# Patient Record
Sex: Female | Born: 1945 | Race: White | Hispanic: No | Marital: Single | Smoking: Never smoker
Health system: Southern US, Community
[De-identification: ages and names within clinical notes are randomized; demographics above are authoritative.]

## PROBLEM LIST (undated history)

## (undated) DIAGNOSIS — Z961 Presence of intraocular lens: Secondary | ICD-10-CM

## (undated) DIAGNOSIS — F419 Anxiety disorder, unspecified: Secondary | ICD-10-CM

## (undated) DIAGNOSIS — E669 Obesity, unspecified: Secondary | ICD-10-CM

## (undated) DIAGNOSIS — H33199 Other retinoschisis and retinal cysts, unspecified eye: Secondary | ICD-10-CM

## (undated) DIAGNOSIS — I219 Acute myocardial infarction, unspecified: Secondary | ICD-10-CM

## (undated) DIAGNOSIS — M109 Gout, unspecified: Secondary | ICD-10-CM

## (undated) DIAGNOSIS — K219 Gastro-esophageal reflux disease without esophagitis: Secondary | ICD-10-CM

## (undated) DIAGNOSIS — M48061 Spinal stenosis, lumbar region without neurogenic claudication: Secondary | ICD-10-CM

## (undated) DIAGNOSIS — R7303 Prediabetes: Secondary | ICD-10-CM

## (undated) DIAGNOSIS — Z87442 Personal history of urinary calculi: Secondary | ICD-10-CM

## (undated) DIAGNOSIS — I5189 Other ill-defined heart diseases: Secondary | ICD-10-CM

## (undated) DIAGNOSIS — M1990 Unspecified osteoarthritis, unspecified site: Secondary | ICD-10-CM

## (undated) DIAGNOSIS — I2510 Atherosclerotic heart disease of native coronary artery without angina pectoris: Secondary | ICD-10-CM

## (undated) DIAGNOSIS — I10 Essential (primary) hypertension: Secondary | ICD-10-CM

## (undated) DIAGNOSIS — E785 Hyperlipidemia, unspecified: Secondary | ICD-10-CM

## (undated) DIAGNOSIS — C801 Malignant (primary) neoplasm, unspecified: Secondary | ICD-10-CM

## (undated) DIAGNOSIS — B019 Varicella without complication: Secondary | ICD-10-CM

## (undated) DIAGNOSIS — C189 Malignant neoplasm of colon, unspecified: Secondary | ICD-10-CM

## (undated) DIAGNOSIS — K635 Polyp of colon: Secondary | ICD-10-CM

## (undated) HISTORY — DX: Malignant (primary) neoplasm, unspecified: C80.1

## (undated) HISTORY — DX: Malignant neoplasm of colon, unspecified: C18.9

## (undated) HISTORY — PX: ABDOMINAL HYSTERECTOMY: SHX81

## (undated) HISTORY — PX: TONSILLECTOMY: SUR1361

## (undated) HISTORY — DX: Gastro-esophageal reflux disease without esophagitis: K21.9

## (undated) HISTORY — PX: EYE SURGERY: SHX253

## (undated) HISTORY — PX: COLON SURGERY: SHX602

## (undated) HISTORY — DX: Hyperlipidemia, unspecified: E78.5

## (undated) HISTORY — DX: Essential (primary) hypertension: I10

## (undated) HISTORY — PX: CARDIAC CATHETERIZATION: SHX172

## (undated) HISTORY — DX: Atherosclerotic heart disease of native coronary artery without angina pectoris: I25.10

## (undated) HISTORY — PX: APPENDECTOMY: SHX54

## (undated) HISTORY — DX: Other ill-defined heart diseases: I51.89

## (undated) HISTORY — DX: Gout, unspecified: M10.9

## (undated) HISTORY — PX: RIB RESECTION: SHX5077

## (undated) HISTORY — DX: Polyp of colon: K63.5

---

## 1990-11-26 DIAGNOSIS — C189 Malignant neoplasm of colon, unspecified: Secondary | ICD-10-CM

## 1990-11-26 HISTORY — PX: NEPHRECTOMY: SHX65

## 1990-11-26 HISTORY — DX: Malignant neoplasm of colon, unspecified: C18.9

## 1991-03-04 HISTORY — PX: COLON RESECTION: SHX5231

## 1991-11-27 HISTORY — PX: HERNIA REPAIR: SHX51

## 2006-09-26 ENCOUNTER — Ambulatory Visit: Payer: Self-pay | Admitting: Urology

## 2006-11-11 ENCOUNTER — Emergency Department: Payer: Self-pay | Admitting: Emergency Medicine

## 2006-12-21 ENCOUNTER — Inpatient Hospital Stay: Payer: Self-pay | Admitting: Internal Medicine

## 2006-12-21 ENCOUNTER — Other Ambulatory Visit: Payer: Self-pay

## 2006-12-26 DIAGNOSIS — K219 Gastro-esophageal reflux disease without esophagitis: Secondary | ICD-10-CM | POA: Insufficient documentation

## 2007-05-06 ENCOUNTER — Ambulatory Visit: Payer: Self-pay

## 2008-01-18 ENCOUNTER — Other Ambulatory Visit: Payer: Self-pay

## 2008-01-20 ENCOUNTER — Inpatient Hospital Stay: Payer: Self-pay | Admitting: Internal Medicine

## 2008-02-09 ENCOUNTER — Ambulatory Visit: Payer: Self-pay | Admitting: Urology

## 2008-07-02 ENCOUNTER — Other Ambulatory Visit: Payer: Self-pay

## 2008-07-02 ENCOUNTER — Emergency Department: Payer: Self-pay | Admitting: Emergency Medicine

## 2008-07-03 ENCOUNTER — Emergency Department: Payer: Self-pay | Admitting: Emergency Medicine

## 2008-10-14 ENCOUNTER — Emergency Department: Payer: Self-pay | Admitting: Emergency Medicine

## 2008-10-15 ENCOUNTER — Ambulatory Visit: Payer: Self-pay | Admitting: Emergency Medicine

## 2008-10-24 ENCOUNTER — Observation Stay: Payer: Self-pay | Admitting: Internal Medicine

## 2009-10-07 ENCOUNTER — Ambulatory Visit: Payer: Self-pay | Admitting: General Surgery

## 2009-11-26 DIAGNOSIS — K635 Polyp of colon: Secondary | ICD-10-CM

## 2009-11-26 HISTORY — DX: Polyp of colon: K63.5

## 2010-01-18 ENCOUNTER — Ambulatory Visit: Payer: Self-pay | Admitting: General Surgery

## 2010-03-14 ENCOUNTER — Ambulatory Visit: Payer: Self-pay | Admitting: General Surgery

## 2010-03-14 HISTORY — PX: COLONOSCOPY: SHX174

## 2011-03-03 ENCOUNTER — Emergency Department: Payer: Self-pay | Admitting: Emergency Medicine

## 2011-03-03 ENCOUNTER — Encounter: Payer: Self-pay | Admitting: Cardiovascular Disease

## 2011-03-08 ENCOUNTER — Encounter: Payer: Self-pay | Admitting: Cardiovascular Disease

## 2011-03-08 ENCOUNTER — Ambulatory Visit (INDEPENDENT_AMBULATORY_CARE_PROVIDER_SITE_OTHER): Payer: Medicare Other | Admitting: Cardiovascular Disease

## 2011-03-08 DIAGNOSIS — I10 Essential (primary) hypertension: Secondary | ICD-10-CM

## 2011-03-08 DIAGNOSIS — E785 Hyperlipidemia, unspecified: Secondary | ICD-10-CM

## 2011-03-08 DIAGNOSIS — R079 Chest pain, unspecified: Secondary | ICD-10-CM

## 2011-03-08 NOTE — Progress Notes (Signed)
   Patient ID: Carrie Larsen, female    DOB: [birth year], 65 y.o.   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  She reports that she had the acute onset of left breast pain. She described it as a squeezing sensation that was quite uncomfortable. It waxed and waned, coming on for several seconds at a time before easing off and then coming on again. This seemed to resolve in the emergency room. EKG and troponin as well as other labs were normal. She's not had any similar symptoms since then though she does have significant amount of burping which is unusual for her. She is also noticed some sharp stabbing pain that comes on on the left side that is quick, like a "lightening bolt ".  She does have a breast exam scheduled in the very near future. She has been started on a low dose simvastatin 10 mg daily for her hyperlipidemia.  At baseline, she is very active, works as a Child psychotherapist and can ambulate and move her usual meal tray without reproducing any chest pain  EKG today shows normal sinus rhythm with rate 62 beats per minute with no significant ST or T wave changes     Review of Systems  Constitutional: Negative.   HENT: Negative.   Eyes: Negative.   Respiratory: Negative.   Cardiovascular: Positive for chest pain.  Gastrointestinal: Negative.   Musculoskeletal: Negative.   Skin: Negative.   Neurological: Negative.   Hematological: Negative.   Psychiatric/Behavioral: Negative.   All other systems reviewed and are negative.   BP 142/78  Pulse 62  Ht 5\' 10"  (1.778 m)  Wt 234 lb 12.8 oz (106.505 kg)  BMI 33.69 kg/m2   Physical Exam  Nursing note and vitals reviewed. Constitutional: She is oriented to person, place, and time. She appears well-developed and  well-nourished.  HENT:  Head: Normocephalic.  Nose: Nose normal.  Mouth/Throat: Oropharynx is clear and moist.  Eyes: Conjunctivae are normal. Pupils are equal, round, and reactive to light.  Neck: Normal range of motion. Neck supple. No JVD present.  Cardiovascular: Normal rate, regular rhythm, normal heart sounds and intact distal pulses.  Exam reveals no gallop and no friction rub.   No murmur heard. Pulmonary/Chest: Effort normal and breath sounds normal. No respiratory distress. She has no wheezes. She has no rales. She exhibits no tenderness.  Abdominal: Soft. Bowel sounds are normal. She exhibits no distension. There is no tenderness.  Musculoskeletal: Normal range of motion. She exhibits no edema and no tenderness.  Lymphadenopathy:    She has no cervical adenopathy.  Neurological: She is alert and oriented to person, place, and time. Coordination normal.  Skin: Skin is warm and dry. No rash noted. No erythema.  Psychiatric: She has a normal mood and affect. Her behavior is normal. Judgment and thought content normal.         Assessment and Plan

## 2011-03-08 NOTE — Assessment & Plan Note (Signed)
Blood pressure is well controlled on today's visit. No changes made to the medications. 

## 2011-03-08 NOTE — Assessment & Plan Note (Signed)
Chest pain symptoms are somewhat atypical in nature. She does have some risk factors including hyperlipidemia, remote smoking. We will set her up for a routine treadmill study next week.

## 2011-03-08 NOTE — Patient Instructions (Addendum)
  You are scheduled for a Treadmill Stress Test for Thursday 03/15/11 @ 9:30. Please HOLD your Metoprolol the evening prior to test AND the MORNING of test. Please wear comfortable clothing and walking shoes. No other preparation is needed for test.  You will receive the results of your test the same day. Otherwise, you can follow up as needed.    Exercise Test, Stress Test  An exercise stress test is a heart test (EKG) which is done while you are moving. You will walk on a treadmill. This test will tell your doctor how your heart does when it is forced to work harder and how much activity you can safely handle. A trained technician, a doctor, or physician assistant (PA) who specializes in heart disease will perform the test. WHAT SHOULD I WEAR?  Shorts or athletic pants.   Comfortable tennis shoes.   Women need to wear a bra that allows patches to be put on under it.  WHAT WILL HAPPEN DURING THE TEST?  An EKG cable will be attached to your waist. This cable is hooked up to patches, which look like round stickers stuck to your chest.   You will be asked to walk on the treadmill.   You will walk until you are too tired or until you are told to stop.  HOW LONG WILL THE TEST LAST?  It may last 30 minutes to 1 hour. The timing depends on your physical condition and the condition of your heart.   Tell the doctor or PA right away if you have:   Chest pain.  Leg cramps.   Shortness of breath.  Dizziness.   WHAT HAPPENS AFTER THE TEST?  You will rest for about 6 minutes. During this time, your technician will check your heart rhythm and blood pressure.   The testing equipment will be removed from your body and you can get dressed.   You may go home or back to your hospital room. You may keep doing all your usual activities as directed by your doctor.  FINDING OUT THE RESULTS OF YOUR TEST Ask your doctor when your test results will be ready. Make sure you follow up and get your  test results. Document Released: 04/30/2008 Document Re-Released: 02/06/2010

## 2011-03-08 NOTE — Assessment & Plan Note (Signed)
I would agree with cholesterol medication. She has had problems with myalgia in the past.

## 2011-03-15 ENCOUNTER — Ambulatory Visit (INDEPENDENT_AMBULATORY_CARE_PROVIDER_SITE_OTHER): Payer: Medicare Other | Admitting: Cardiovascular Disease

## 2011-03-15 ENCOUNTER — Ambulatory Visit: Payer: Self-pay | Admitting: Family Medicine

## 2011-03-15 DIAGNOSIS — R079 Chest pain, unspecified: Secondary | ICD-10-CM

## 2011-03-15 NOTE — Progress Notes (Deleted)
Adult Stress Test Report  03/15/2011   Requesting Physician: Provider Not In System  Study: {noninvasive testing:14697}  Pre-test ECG: {normal/abnormal:14647}  Level of Stress:  ***% age-predicted max HR  *** METS achieved  Functional Capacity: {funct capacity:14698}  Abnormal Symptoms: {symptoms:14699}  Heart Rate Response: {hr response:14700}  BP Response:  {bp response:14701}  Baseline LVEF: Echo *** %,  Nuclear *** %  Stress ECG: {findings; ecg:14702}  Stress Imaging Report:  {findings; stress imaging:14703}   Impression:   {findings; stress test:14704}  Interpreted by:  Bishop Dublin 03/15/2011

## 2011-03-17 NOTE — Progress Notes (Signed)
Treadmill ordered for recent epsiodes of chest pain.  Resting EKG shows NSR with rate of 102 bpm, no significant ST or T wave changes Resting blood pressure of 130/86. Manual protocal was used.  Patient exercised for 10 min 5 sec,  Peak heart rate of 155 bpm.  This was 107% of the maximum predicted heart rate (target heart rate 131). Achieved 7.3 METS No symptoms of chest pain or lightheadedness were reported at peak stress or in recovery.  Peak Blood pressure recorded was 172/68. Heart rate at 3 minutes in recovery was 112 bpm.  FINAL IMPRESSION: Normal exercise stress test. No significant EKG changes concerning for ischemia. Baseline sinus tachycardia off B-blockers. Excellent exercise tolerance.

## 2011-04-03 ENCOUNTER — Encounter: Payer: Self-pay | Admitting: Cardiovascular Disease

## 2011-04-30 ENCOUNTER — Other Ambulatory Visit: Payer: Self-pay | Admitting: Cardiovascular Disease

## 2011-08-29 ENCOUNTER — Emergency Department: Payer: Self-pay | Admitting: Emergency Medicine

## 2011-12-28 ENCOUNTER — Ambulatory Visit: Payer: Self-pay | Admitting: Urology

## 2012-01-09 ENCOUNTER — Ambulatory Visit: Payer: Self-pay | Admitting: Urology

## 2012-06-12 ENCOUNTER — Ambulatory Visit: Payer: Medicare Other | Admitting: Cardiovascular Disease

## 2012-06-17 ENCOUNTER — Ambulatory Visit: Payer: Self-pay | Admitting: Internal Medicine

## 2012-08-23 ENCOUNTER — Ambulatory Visit: Payer: Self-pay | Admitting: Medical

## 2012-08-23 LAB — URINALYSIS, COMPLETE
Bilirubin,UR: NEGATIVE
Blood: NEGATIVE
Glucose,UR: NEGATIVE mg/dL (ref 0–75)
Ketone: NEGATIVE
Nitrite: NEGATIVE
Ph: 6.5 (ref 4.5–8.0)
Protein: NEGATIVE
RBC,UR: NONE SEEN /HPF (ref 0–5)
Specific Gravity: 1.015 (ref 1.003–1.030)

## 2012-08-24 ENCOUNTER — Emergency Department: Payer: Self-pay | Admitting: Emergency Medicine

## 2012-08-24 LAB — CBC WITH DIFFERENTIAL/PLATELET
Basophil #: 0 10*3/uL (ref 0.0–0.1)
Basophil %: 0.8 %
Eosinophil #: 0.2 10*3/uL (ref 0.0–0.7)
Eosinophil %: 2.7 %
HCT: 42 % (ref 35.0–47.0)
HGB: 14.4 g/dL (ref 12.0–16.0)
Lymphocyte #: 2.9 10*3/uL (ref 1.0–3.6)
Lymphocyte %: 43.8 %
MCH: 30.2 pg (ref 26.0–34.0)
MCHC: 34.2 g/dL (ref 32.0–36.0)
MCV: 88 fL (ref 80–100)
Monocyte #: 0.5 x10 3/mm (ref 0.2–0.9)
Monocyte %: 7.3 %
Neutrophil #: 3 10*3/uL (ref 1.4–6.5)
Neutrophil %: 45.4 %
Platelet: 296 10*3/uL (ref 150–440)
RBC: 4.75 10*6/uL (ref 3.80–5.20)
RDW: 13.4 % (ref 11.5–14.5)
WBC: 6.6 10*3/uL (ref 3.6–11.0)

## 2012-08-24 LAB — URINALYSIS, COMPLETE
Bilirubin,UR: NEGATIVE
Blood: NEGATIVE
Glucose,UR: NEGATIVE mg/dL (ref 0–75)
Ketone: NEGATIVE
Leukocyte Esterase: NEGATIVE
Nitrite: POSITIVE
Ph: 5 (ref 4.5–8.0)
Protein: NEGATIVE
RBC,UR: 4 /HPF (ref 0–5)
Specific Gravity: 1.013 (ref 1.003–1.030)
Squamous Epithelial: 2
WBC UR: 13 /HPF (ref 0–5)

## 2012-08-24 LAB — COMPREHENSIVE METABOLIC PANEL
Albumin: 3.8 g/dL (ref 3.4–5.0)
Alkaline Phosphatase: 130 U/L (ref 50–136)
Anion Gap: 9 (ref 7–16)
BUN: 19 mg/dL — ABNORMAL HIGH (ref 7–18)
Bilirubin,Total: 0.4 mg/dL (ref 0.2–1.0)
Calcium, Total: 9.4 mg/dL (ref 8.5–10.1)
Chloride: 108 mmol/L — ABNORMAL HIGH (ref 98–107)
Co2: 24 mmol/L (ref 21–32)
Creatinine: 0.85 mg/dL (ref 0.60–1.30)
EGFR (African American): >60
EGFR (Non-African Amer.): >60
Glucose: 108 mg/dL — ABNORMAL HIGH (ref 65–99)
Osmolality: 284 (ref 275–301)
Potassium: 4.4 mmol/L (ref 3.5–5.1)
SGOT(AST): 32 U/L (ref 15–37)
SGPT (ALT): 64 U/L (ref 12–78)
Sodium: 141 mmol/L (ref 136–145)
Total Protein: 7.3 g/dL (ref 6.4–8.2)

## 2012-08-25 LAB — URINE CULTURE

## 2013-01-26 ENCOUNTER — Emergency Department: Payer: Self-pay | Admitting: Emergency Medicine

## 2013-01-26 LAB — URINALYSIS, COMPLETE
Bilirubin,UR: NEGATIVE
Blood: NEGATIVE
Glucose,UR: NEGATIVE mg/dL (ref 0–75)
Ketone: NEGATIVE
Nitrite: NEGATIVE
Ph: 5 (ref 4.5–8.0)
Protein: NEGATIVE
RBC,UR: 6 /HPF (ref 0–5)
Specific Gravity: 1.024 (ref 1.003–1.030)
Squamous Epithelial: 1
WBC UR: 24 /HPF (ref 0–5)

## 2013-01-29 DIAGNOSIS — F411 Generalized anxiety disorder: Secondary | ICD-10-CM | POA: Insufficient documentation

## 2013-03-23 DIAGNOSIS — M2010 Hallux valgus (acquired), unspecified foot: Secondary | ICD-10-CM | POA: Insufficient documentation

## 2013-03-23 DIAGNOSIS — M21619 Bunion of unspecified foot: Secondary | ICD-10-CM | POA: Insufficient documentation

## 2013-06-04 ENCOUNTER — Ambulatory Visit: Payer: Medicare Other | Admitting: Cardiovascular Disease

## 2013-06-09 ENCOUNTER — Encounter: Payer: Self-pay | Admitting: *Deleted

## 2013-06-18 ENCOUNTER — Ambulatory Visit: Payer: Medicare Other | Admitting: Cardiovascular Disease

## 2013-11-14 ENCOUNTER — Emergency Department: Payer: Self-pay | Admitting: Emergency Medicine

## 2013-11-14 LAB — CBC
HCT: 42.9 % (ref 35.0–47.0)
HGB: 14.4 g/dL (ref 12.0–16.0)
MCH: 29 pg (ref 26.0–34.0)
MCHC: 33.6 g/dL (ref 32.0–36.0)
MCV: 86 fL (ref 80–100)
Platelet: 299 10*3/uL (ref 150–440)
RBC: 4.97 10*6/uL (ref 3.80–5.20)
RDW: 13.6 % (ref 11.5–14.5)
WBC: 7.5 10*3/uL (ref 3.6–11.0)

## 2013-11-14 LAB — BASIC METABOLIC PANEL
Anion Gap: 5 — ABNORMAL LOW (ref 7–16)
BUN: 20 mg/dL — ABNORMAL HIGH (ref 7–18)
Calcium, Total: 9.2 mg/dL (ref 8.5–10.1)
Chloride: 107 mmol/L (ref 98–107)
Co2: 27 mmol/L (ref 21–32)
Creatinine: 0.82 mg/dL (ref 0.60–1.30)
EGFR (African American): >60
EGFR (Non-African Amer.): >60
Glucose: 111 mg/dL — ABNORMAL HIGH (ref 65–99)
Osmolality: 281 (ref 275–301)
Potassium: 4.2 mmol/L (ref 3.5–5.1)
Sodium: 139 mmol/L (ref 136–145)

## 2013-11-14 LAB — TROPONIN I: Troponin-I: <0.02 ng/mL

## 2013-11-15 LAB — URINALYSIS, COMPLETE
Bacteria: NONE SEEN
Bilirubin,UR: NEGATIVE
Blood: NEGATIVE
Glucose,UR: NEGATIVE mg/dL (ref 0–75)
Ketone: NEGATIVE
Nitrite: NEGATIVE
Ph: 5 (ref 4.5–8.0)
Protein: NEGATIVE
RBC,UR: 3 /HPF (ref 0–5)
Specific Gravity: 1.029 (ref 1.003–1.030)
Squamous Epithelial: 4
WBC UR: 14 /HPF (ref 0–5)

## 2013-11-25 ENCOUNTER — Ambulatory Visit: Payer: Medicare Other | Admitting: Cardiovascular Disease

## 2013-12-02 ENCOUNTER — Ambulatory Visit: Payer: Medicare Other | Admitting: Cardiovascular Disease

## 2014-01-14 ENCOUNTER — Ambulatory Visit: Payer: Medicare Other | Admitting: Cardiovascular Disease

## 2014-02-04 ENCOUNTER — Encounter: Payer: Self-pay | Admitting: Cardiovascular Disease

## 2014-02-04 ENCOUNTER — Ambulatory Visit (INDEPENDENT_AMBULATORY_CARE_PROVIDER_SITE_OTHER): Payer: Medicare Other | Admitting: Cardiovascular Disease

## 2014-02-04 VITALS — BP 140/80 | HR 74 | Ht 69.0 in | Wt 246.8 lb

## 2014-02-04 DIAGNOSIS — I10 Essential (primary) hypertension: Secondary | ICD-10-CM

## 2014-02-04 DIAGNOSIS — R079 Chest pain, unspecified: Secondary | ICD-10-CM

## 2014-02-04 DIAGNOSIS — E785 Hyperlipidemia, unspecified: Secondary | ICD-10-CM

## 2014-02-04 NOTE — Assessment & Plan Note (Signed)
Blood pressure is well controlled on today's visit. No changes made to the medications. 

## 2014-02-04 NOTE — Assessment & Plan Note (Signed)
No further episodes of chest pain.

## 2014-02-04 NOTE — Progress Notes (Signed)
   Patient ID: Carrie Larsen, female    DOB: 09-Jan-1946, 68 y.o.   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  Overall she feels well and has had no further episodes of chest pain. She is very active at work, able to walk long distances without any symptoms. She reports that she has tried several cholesterol medications and all of these have called myalgias. She recalls trying Lipitor, simvastatin, Crestor, possibly others   she did not try coenzyme Q10 . She reports that her boyfriend takes this .  She reports having an episode of left-sided numbness on her face 11/14/2013. Workup was negative. Including cardiac enzymes, basic metabolic panels, CBC, chest x-ray and urinalysis  Prior cholesterol numbers from 2011 showed total cholesterol 272, LDL 170   EKG today shows normal sinus rhythm with rate 74 beats per minute with no significant ST or T wave changes    Outpatient Encounter Prescriptions as of 02/04/2014  Medication Sig  . esomeprazole (NEXIUM) 40 MG capsule Take 40 mg by mouth daily before breakfast.    . metoprolol (LOPRESSOR) 50 MG tablet Take 50 mg by mouth 2 (two) times daily.     Review of Systems  Constitutional: Negative.   HENT: Negative.   Eyes: Negative.   Respiratory: Negative.   Cardiovascular: Negative.   Gastrointestinal: Negative.   Endocrine: Negative.   Musculoskeletal: Negative.   Skin: Negative.   Allergic/Immunologic: Negative.   Neurological: Negative.   Hematological: Negative.   Psychiatric/Behavioral: Negative.   All other systems reviewed and are negative.    BP 140/80  Pulse 74  Ht 5\' 9"  (1.753 m)  Wt 246 lb 12 oz (111.925 kg)  BMI 36.42  kg/m2  Physical Exam  Nursing note and vitals reviewed. Constitutional: She is oriented to person, place, and time. She appears well-developed and well-nourished.  HENT:  Head: Normocephalic.  Nose: Nose normal.  Mouth/Throat: Oropharynx is clear and moist.  Eyes: Conjunctivae are normal. Pupils are equal, round, and reactive to light.  Neck: Normal range of motion. Neck supple. No JVD present.  Cardiovascular: Normal rate, regular rhythm, S1 normal, S2 normal, normal heart sounds and intact distal pulses.  Exam reveals no gallop and no friction rub.   No murmur heard. Pulmonary/Chest: Effort normal and breath sounds normal. No respiratory distress. She has no wheezes. She has no rales. She exhibits no tenderness.  Abdominal: Soft. Bowel sounds are normal. She exhibits no distension. There is no tenderness.  Musculoskeletal: Normal range of motion. She exhibits no edema and no tenderness.  Lymphadenopathy:    She has no cervical adenopathy.  Neurological: She is alert and oriented to person, place, and time. Coordination normal.  Skin: Skin is warm and dry. No rash noted. No erythema.  Psychiatric: She has a normal mood and affect. Her behavior is normal. Judgment and thought content normal.    Assessment and Plan

## 2014-02-04 NOTE — Patient Instructions (Addendum)
You are doing well. Start livalo 2 mg every other day Take with CoQ10  If no symptoms in 2 to 3 months, come in for for  Blood work If cholesterol continues to run high, we could change to daily dosing  Please call us if you have new issues that need to be addressed before your next appt.  Your physician wants you to follow-up in: 6 months.  You will receive a reminder letter in the mail two months in advance. If you don't receive a letter, please call our office to schedule the follow-up appointment.

## 2014-02-04 NOTE — Assessment & Plan Note (Signed)
We have recommended that she try Livalo 2 mg every other day, also take coenzyme Q 10. If tolerated, this could be advanced to 2 mg daily. [facility] have had success with this medication . If tolerated we will check cholesterol level in 3 months time

## 2014-04-29 ENCOUNTER — Telehealth: Payer: Self-pay | Admitting: *Deleted

## 2014-04-29 NOTE — Telephone Encounter (Signed)
Phone call states she is having problems with constipation. She has been trying Miralax and stool softeners. Aware it has been several years since she has been seen in our office. She has left a message with her PCP Dr Tobin Chad as well. Encouraged to drink plenty of fluids as well. Appointment made with Dr. Bary Castilla for 05-11-14.

## 2014-04-30 ENCOUNTER — Emergency Department: Payer: Self-pay | Admitting: Emergency Medicine

## 2014-05-01 LAB — BASIC METABOLIC PANEL
Anion Gap: 3 — ABNORMAL LOW (ref 7–16)
BUN: 14 mg/dL (ref 7–18)
Calcium, Total: 9.6 mg/dL (ref 8.5–10.1)
Chloride: 108 mmol/L — ABNORMAL HIGH (ref 98–107)
Co2: 30 mmol/L (ref 21–32)
Creatinine: 0.93 mg/dL (ref 0.60–1.30)
EGFR (African American): >60
EGFR (Non-African Amer.): >60
Glucose: 92 mg/dL (ref 65–99)
Osmolality: 281 (ref 275–301)
Potassium: 4.4 mmol/L (ref 3.5–5.1)
Sodium: 141 mmol/L (ref 136–145)

## 2014-05-01 LAB — CBC
HCT: 45.6 % (ref 35.0–47.0)
HGB: 14.7 g/dL (ref 12.0–16.0)
MCH: 28.4 pg (ref 26.0–34.0)
MCHC: 32.2 g/dL (ref 32.0–36.0)
MCV: 88 fL (ref 80–100)
Platelet: 291 10*3/uL (ref 150–440)
RBC: 5.17 10*6/uL (ref 3.80–5.20)
RDW: 13.7 % (ref 11.5–14.5)
WBC: 9.2 10*3/uL (ref 3.6–11.0)

## 2014-05-01 LAB — TROPONIN I
Troponin-I: <0.02 ng/mL
Troponin-I: <0.02 ng/mL

## 2014-05-05 ENCOUNTER — Telehealth: Payer: Self-pay | Admitting: *Deleted

## 2014-05-05 NOTE — Telephone Encounter (Signed)
Pt has an appointment on Tuesday 05/11/14 with Dr.Byrnett but she is worried because now for 3 days in her abdomen she feels it pulsating all the time, she just wants to know what she can do till Tuesday

## 2014-05-05 NOTE — Telephone Encounter (Signed)
She states she had to go to the ER over the weekend, impacted with stool. They gave her miralax, colace, senna, all to use. She is having normal bowel movements daily. Instructed that if additional symptoms  (sharpe pain, fever, bleeding etc.) to go to the ED. She just wanted to make sure Dr. Bary Castilla knew she had called. Appointment is for next week.

## 2014-05-11 ENCOUNTER — Ambulatory Visit (INDEPENDENT_AMBULATORY_CARE_PROVIDER_SITE_OTHER): Payer: Medicare Other | Admitting: General Surgery

## 2014-05-11 ENCOUNTER — Encounter: Payer: Self-pay | Admitting: General Surgery

## 2014-05-11 VITALS — BP 132/82 | HR 74 | Temp 96.7°F | Resp 16 | Ht 69.0 in | Wt 246.0 lb

## 2014-05-11 DIAGNOSIS — Z85038 Personal history of other malignant neoplasm of large intestine: Secondary | ICD-10-CM

## 2014-05-11 DIAGNOSIS — R1012 Left upper quadrant pain: Secondary | ICD-10-CM | POA: Insufficient documentation

## 2014-05-11 NOTE — Patient Instructions (Signed)
The patient is aware to call back for any questions or concerns. Make use of miralax on a daily basis.

## 2014-05-11 NOTE — Progress Notes (Signed)
Patient ID: Carrie Larsen, female   DOB: July 28, 1946, 68 y.o.   MRN: [id number]  Chief Complaint  Patient presents with  . Abdominal Pain    HPI Carrie Larsen is a 68 y.o. female here today for abdominal pain. Patient states the pain is been going on for about 1-2 month. The pain "dull ache" , in the left abdomen, that comes and goes every day. She states it last a few hours. The sharp pain last only briefly.  She is having problems with constipation. She has tried Miralax and stool softeners. She states she had to go to the ER 04-30-14, impacted with stool. They gave her miralax, colace, senna, all to use. She is having normal bowel movements  For about a week but now she is constipated again. No bleeding noted. No rectal pain.   HPI  Past Medical History  Diagnosis Date  . Hypertension 2009  . Hyperlipidemia   . GERD (gastroesophageal reflux disease)   . Colon cancer 1992    T3, N1, M0.    Past Surgical History  Procedure Laterality Date  . Appendectomy    . Abdominal hysterectomy    . Colon resection    . Nephrectomy      left  . Hernia repair    . Tonsillectomy    . Colonoscopy  03-14-10    Dr Bary Castilla, tubular adenoma at 25 cm.    Family History  Problem Relation Age of Onset  . Cerebral aneurysm Father   . Colon cancer Mother     Social History History  Substance Use Topics  . Smoking status: Never Smoker   . Smokeless tobacco: Never Used  . Alcohol Use: No    Allergies  Allergen Reactions  . Morphine And Related Nausea Only    Current Outpatient Prescriptions  Medication Sig Dispense Refill  . esomeprazole (NEXIUM) 40 MG capsule Take 40 mg by mouth daily before breakfast.        . metoprolol (LOPRESSOR) 50 MG tablet Take 50 mg by mouth 2 (two) times daily.        . Multiple Vitamins-Minerals (MULTIVITAMIN WITH MINERALS) tablet Take 1 tablet by mouth daily.       No current facility-administered medications for this visit.    Review of  Systems Review of Systems  Constitutional: Negative.   Respiratory: Negative.   Cardiovascular: Negative.   Gastrointestinal: Positive for abdominal pain and constipation. Negative for nausea, vomiting, diarrhea, blood in stool and anal bleeding.    Blood pressure 132/82, pulse 74, temperature 96.7 F (35.9 C), temperature source Oral, resp. rate 16, height 5\' 9"  (1.753 m), weight 246 lb (111.585 kg).  Physical Exam Physical Exam  Constitutional: She is oriented to person, place, and time. She appears well-developed and well-nourished.  Neck: Neck supple.  Cardiovascular: Normal rate, regular rhythm and normal heart sounds.   Pulmonary/Chest: Effort normal and breath sounds normal.  Abdominal: Soft. Normal appearance. There is no tenderness. No hernia.    Well healed lower midline abdominal incision  Lymphadenopathy:    She has no cervical adenopathy.  Neurological: She is alert and oriented to person, place, and time.  Skin: Skin is warm and dry.    Data Reviewed Plain films of the chest and abdomen obtained in the emergency department on April 30, 2049 were reviewed. No acute changes.  Assessment    Chronic constipation.     Plan    The patient was encouraged to make use of daily MiraLax.  She would be a candidate for a follow up colonoscopy in 2016 based on her findings of a polyp in 2011.     Make use of miralax on a daily basis.  PCP: Archie Balboa 05/11/2014, 7:12 PM

## 2014-05-19 ENCOUNTER — Ambulatory Visit (INDEPENDENT_AMBULATORY_CARE_PROVIDER_SITE_OTHER): Payer: Medicare Other | Admitting: Podiatry

## 2014-05-19 ENCOUNTER — Encounter: Payer: Self-pay | Admitting: Podiatry

## 2014-05-19 ENCOUNTER — Encounter: Payer: Self-pay | Admitting: General Surgery

## 2014-05-19 VITALS — BP 153/86 | HR 70 | Resp 16 | Ht 69.0 in | Wt 244.0 lb

## 2014-05-19 DIAGNOSIS — Q828 Other specified congenital malformations of skin: Secondary | ICD-10-CM

## 2014-05-19 DIAGNOSIS — B079 Viral wart, unspecified: Secondary | ICD-10-CM

## 2014-05-19 DIAGNOSIS — M79609 Pain in unspecified limb: Secondary | ICD-10-CM

## 2014-05-19 NOTE — Progress Notes (Signed)
She presents today with a chief complaint of a painful lesion plantar aspect of the left foot.  Objective: Pulses are strongly palpable left foot. Reactive hyperkeratosis plantar aspect of the substance metatarsal head of the left foot.  Assessment: Porokeratosis sub-fifth met head left.  Plan: Chemical debridement with salicylic acid under occlusion today followup with her in a few weeks if necessary.

## 2014-05-20 ENCOUNTER — Encounter: Payer: Self-pay | Admitting: General Surgery

## 2014-06-09 ENCOUNTER — Telehealth: Payer: Self-pay | Admitting: *Deleted

## 2014-06-09 NOTE — Telephone Encounter (Signed)
Pt called wanting to know if the stuff that dr Milinda Pointer put on her foot will come off. She stated that he treated her with an acid and there seems to be a piece on the bottom of her foot still there. i left her a message letting her know that it could take 6 weeks or more to grow and come off itself, do not pick at it , it will eventually grow out and come off.

## 2014-08-30 ENCOUNTER — Emergency Department: Payer: Self-pay | Admitting: Emergency Medicine

## 2014-09-03 ENCOUNTER — Ambulatory Visit: Payer: Medicare Other | Admitting: Podiatry

## 2014-09-09 ENCOUNTER — Ambulatory Visit (INDEPENDENT_AMBULATORY_CARE_PROVIDER_SITE_OTHER): Payer: Medicare Other | Admitting: Podiatry

## 2014-09-09 VITALS — BP 147/79 | HR 73 | Resp 16

## 2014-09-09 DIAGNOSIS — Q828 Other specified congenital malformations of skin: Secondary | ICD-10-CM

## 2014-09-12 NOTE — Progress Notes (Signed)
Patient ID: Carrie Larsen, female   DOB: 02-Sep-1946, 68 y.o.   MRN: [id number]  Subjective: Carrie Larsen, 68 year old female, returns the office they for recurrence of pain to the left foot. She states that she has a wart on the left foot. She has previously been treated for porokeratosis the left foot by Dr. Milinda Pointer. She states that she has pain directly over the "wart" on the left foot. 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   She also states that she has an irritation to the left big toe as she was wearing a Band-Aid and pealed some the skin off. She was seen by her primary care physician where she was given antibiotic ointment. She states that since starting in about appointment on the big toe area has much improved. Denies any recent injury or trauma to the area. No other complaints at this time. Denies any systemic complaints as fevers, chills, nausea, vomiting  Objective: AAO x3, NAD DP/PT pulses palpable bilaterally, CRT less than 3 seconds Protective sensation intact with Simms Weinstein monofilament, vibratory sensation intact, Achilles tendon reflex intact Small pinpoint hyperkeratotic lesion left foot submetatarsal 5. Upon debridement there is no capillary bleeding and no evidence of verruca. No surrounding erythema or drainage. No clinical signs of infection. Small superficial granular wound on the plantar aspect of the left hallux. No surrounding erythema, drainage, ascending cellulitis, pain. No malodor. MMT 5/5, ROM WNL Calf pain, swelling, warmth  Assessment: Left submetatarsal 5 porokeratosis, plantar hallux healing wound.  Plan: -Surgical versus conservative treatment options discussed including alternatives, risks, complications. -Upon debridement of the submetatarsal 5 lesion there is no evidence of verruca. Pad placed around the lesion and salinocaine was applied followed by a bandage. After care was discussed with the patient. -Continued  antibiotic ointment which was prescribed to her previously to the plantar hallux wound. -Monitor for any clinical signs or symptoms of infection and directed to call the office immediately if any are to occur or go directly to the emergency room. -Followup as needed. Call the office with any questions, concerns, change in symptoms.

## 2014-09-27 ENCOUNTER — Encounter: Payer: Self-pay | Admitting: Podiatry

## 2014-10-14 ENCOUNTER — Ambulatory Visit (INDEPENDENT_AMBULATORY_CARE_PROVIDER_SITE_OTHER): Payer: Medicare Other | Admitting: Cardiovascular Disease

## 2014-10-14 ENCOUNTER — Encounter: Payer: Self-pay | Admitting: Cardiovascular Disease

## 2014-10-14 VITALS — BP 158/86 | HR 69 | Ht 69.0 in | Wt 246.5 lb

## 2014-10-14 DIAGNOSIS — E785 Hyperlipidemia, unspecified: Secondary | ICD-10-CM

## 2014-10-14 DIAGNOSIS — I159 Secondary hypertension, unspecified: Secondary | ICD-10-CM

## 2014-10-14 DIAGNOSIS — R079 Chest pain, unspecified: Secondary | ICD-10-CM

## 2014-10-14 MED ORDER — EZETIMIBE 10 MG PO TABS: 10.0000 mg | ORAL_TABLET | Freq: Every day | ORAL | Status: DC

## 2014-10-14 NOTE — Patient Instructions (Signed)
You are doing well.  Please start crestor on Monday and Thursday After a few weeks, Start zetia one a day  Keep an eye on your blood pressure  Please call us if you have new issues that need to be addressed before your next appt.  Your physician wants you to follow-up in: 12 months.  You will receive a reminder letter in the mail two months in advance. If you don't receive a letter, please call our office to schedule the follow-up appointment.

## 2014-10-14 NOTE — Assessment & Plan Note (Signed)
Recommended she start Crestor 5 mg on Monday and Thursday, start zetia 10 mg daily

## 2014-10-14 NOTE — Progress Notes (Signed)
   Patient ID: Carrie Larsen, female    DOB: 31-Jul-1946, 68 y.o.   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 She works 6 days per week, 10 hours per day. She denies having any significant symptoms of chest pain or shortness of breath. Recently seen by primary care and told that her cholesterol is 311 She has tried Crestor 5 mg every other day but had myalgias Weight has been climbing over the past year or 2  She is very active at work, able to walk long distances without any symptoms. EKG today shows normal sinus rhythm with rate 69 bpm, no significant ST or T wave changes  Other past medical history She reports that she has tried several cholesterol medications and all of these have called myalgias. She recalls trying Lipitor, simvastatin, Crestor, possibly others   she did not try coenzyme Q10 . She reports that her boyfriend takes this .  She reports having an episode of left-sided numbness on her face 11/14/2013. Workup was negative. Including cardiac enzymes, basic metabolic panels, CBC, chest x-ray and urinalysis      Outpatient Encounter Prescriptions as of 10/14/2014  Medication Sig  . esomeprazole (NEXIUM) 40 MG capsule Take 40 mg by mouth daily before breakfast.    . metoprolol (LOPRESSOR) 50 MG tablet Take 50 mg by mouth 2 (two) times daily.    . Multiple Vitamins-Minerals (MULTIVITAMIN WITH MINERALS) tablet Take 1 tablet by mouth daily.  Marland Kitchen ezetimibe (ZETIA) 10 MG tablet Take 1 tablet (10 mg total) by mouth daily.    Review of Systems  Constitutional: Negative.   HENT: Negative.   Respiratory: Negative.    Cardiovascular: Negative.   Musculoskeletal: Negative.   Skin: Negative.   Neurological: Negative.   Hematological: Negative.   All other systems reviewed and are negative.   BP 158/86 mmHg  Pulse 69  Ht 5\' 9"  (1.753 m)  Wt 246 lb 8 oz (111.812 kg)  BMI 36.39 kg/m2  Physical Exam  Constitutional: She is oriented to person, place, and time. She appears well-developed and well-nourished.  Obese  HENT:  Head: Normocephalic.  Nose: Nose normal.  Mouth/Throat: Oropharynx is clear and moist.  Eyes: Conjunctivae are normal. Pupils are equal, round, and reactive to light.  Neck: Normal range of motion. Neck supple. No JVD present.  Cardiovascular: Normal rate, regular rhythm, normal heart sounds and intact distal pulses.  Exam reveals no gallop and no friction rub.   No murmur heard. Pulmonary/Chest: Effort normal and breath sounds normal. No respiratory distress. She has no wheezes. She has no rales. She exhibits no tenderness.  Abdominal: Soft. Bowel sounds are normal. She exhibits no distension. There is no tenderness.  Musculoskeletal: Normal range of motion. She exhibits no edema or tenderness.  Lymphadenopathy:    She has no cervical adenopathy.  Neurological: She is alert and oriented to person, place, and time. Coordination normal.  Skin: Skin is warm and dry. No rash noted. No erythema.  Psychiatric: She has a normal mood and affect. Her behavior is normal. Judgment and thought content normal.

## 2014-10-14 NOTE — Assessment & Plan Note (Signed)
No recent episodes of chest pain. No further workup at this time

## 2014-10-14 NOTE — Assessment & Plan Note (Signed)
She reports blood pressure is well controlled at home and does not want additional medications at this time. Suggested she closely monitor her blood pressure at home and call us if this continues to run high Repeat blood pressure 841 systolic today

## 2014-11-05 ENCOUNTER — Emergency Department: Payer: Self-pay | Admitting: Emergency Medicine

## 2014-12-06 ENCOUNTER — Ambulatory Visit: Payer: Medicare Other | Admitting: Podiatry

## 2014-12-15 ENCOUNTER — Ambulatory Visit (INDEPENDENT_AMBULATORY_CARE_PROVIDER_SITE_OTHER): Payer: Medicare Other | Admitting: Podiatry

## 2014-12-15 VITALS — BP 143/79 | HR 71 | Resp 16

## 2014-12-15 DIAGNOSIS — Q828 Other specified congenital malformations of skin: Secondary | ICD-10-CM

## 2014-12-15 DIAGNOSIS — M778 Other enthesopathies, not elsewhere classified: Secondary | ICD-10-CM

## 2014-12-15 DIAGNOSIS — M7752 Other enthesopathy of left foot: Secondary | ICD-10-CM

## 2014-12-15 DIAGNOSIS — M779 Enthesopathy, unspecified: Secondary | ICD-10-CM

## 2014-12-15 NOTE — Progress Notes (Signed)
She presents today with chief complaint of a painful lesion plantar aspect sub-fifth met head left foot.  Objective: Vital signs are stable she is alert and oriented 3. Pulses are palpable left foot. Palpable bursitis capsulitis sub-fifth metatarsophalangeal joint of the left foot with an overlying reactive hyperkeratosis or porokeratotic lesion.  Assessment: Capsulitis porokeratosis fifth metatarsophalangeal joint left foot.  Plan: Injected today with dexamethasone and local anesthetic at the point of maximal tenderness plantar aspect of the left foot and debrided the reactive hyperkeratotic lesion. Follow up with her as needed.

## 2015-03-09 ENCOUNTER — Ambulatory Visit: Payer: Medicare Other | Admitting: General Surgery

## 2015-03-17 ENCOUNTER — Ambulatory Visit: Payer: Medicare Other | Admitting: General Surgery

## 2015-04-13 ENCOUNTER — Encounter: Payer: Self-pay | Admitting: General Surgery

## 2015-04-14 ENCOUNTER — Ambulatory Visit (INDEPENDENT_AMBULATORY_CARE_PROVIDER_SITE_OTHER): Payer: Medicare Other | Admitting: General Surgery

## 2015-04-14 ENCOUNTER — Encounter: Payer: Self-pay | Admitting: General Surgery

## 2015-04-14 VITALS — BP 150/88 | HR 80 | Resp 14 | Ht 70.0 in | Wt 244.0 lb

## 2015-04-14 DIAGNOSIS — Z86010 Personal history of colonic polyps: Secondary | ICD-10-CM

## 2015-04-14 DIAGNOSIS — Z85038 Personal history of other malignant neoplasm of large intestine: Secondary | ICD-10-CM

## 2015-04-14 MED ORDER — POLYETHYLENE GLYCOL 3350 17 GM/SCOOP PO POWD: ORAL | Status: DC

## 2015-04-14 NOTE — Patient Instructions (Addendum)
Colonoscopy A colonoscopy is an exam to look at the entire large intestine (colon). This exam can help find problems such as tumors, polyps, inflammation, and areas of bleeding. The exam takes about 1 hour.  LET [facility] CARE PROVIDER KNOW ABOUT:   Any allergies you have.  All medicines you are taking, including vitamins, herbs, eye drops, creams, and over-the-counter medicines.  Previous problems you or members of your family have had with the use of anesthetics.  Any blood disorders you have.  Previous surgeries you have had.  Medical conditions you have. RISKS AND COMPLICATIONS  Generally, this is a safe procedure. However, as with any procedure, complications can occur. Possible complications include:  Bleeding.  Tearing or rupture of the colon wall.  Reaction to medicines given during the exam.  Infection (rare). BEFORE THE PROCEDURE   Ask your health care provider about changing or stopping your regular medicines.  You may be prescribed an oral bowel prep. This involves drinking a large amount of medicated liquid, starting the day before your procedure. The liquid will cause you to have multiple loose stools until your stool is almost clear or light green. This cleans out your colon in preparation for the procedure.  Do not eat or drink anything else once you have started the bowel prep, unless your health care provider tells you it is safe to do so.  Arrange for someone to drive you home after the procedure. PROCEDURE   You will be given medicine to help you relax (sedative).  You will lie on your side with your knees bent.  A long, flexible tube with a light and camera on the end (colonoscope) will be inserted through the rectum and into the colon. The camera sends video back to a computer screen as it moves through the colon. The colonoscope also releases carbon dioxide gas to inflate the colon. This helps your health care provider see the area better.  During  the exam, your health care provider may take a small tissue sample (biopsy) to be examined under a microscope if any abnormalities are found.  The exam is finished when the entire colon has been viewed. AFTER THE PROCEDURE   Do not drive for 24 hours after the exam.  You may have a small amount of blood in your stool.  You may pass moderate amounts of gas and have mild abdominal cramping or bloating. This is caused by the gas used to inflate your colon during the exam.  Ask when your test results will be ready and how you will get your results. Make sure you get your test results. Document Released: 11/09/2000 Document Revised: 09/02/2013 Document Reviewed: 07/20/2013 [facility] Patient Information 2015 [place], [place]. This information is not intended to replace advice given to you by your health care provider. Make sure you discuss any questions you have with your health care provider.  Patient has been scheduled for a colonoscopy on 06-07-15 at [facility].

## 2015-04-14 NOTE — Progress Notes (Signed)
Patient ID: Carrie Larsen, female   DOB: [birth year], 69 y.o.   MRN: [id number]  Chief Complaint  Patient presents with  . Colonoscopy    HPI Carrie Larsen is a 69 y.o. female.  Here today to discuss having a colonoscopy. Her last one was 03-06-10. No gastrointestinal issues. Bowels move daily and no blood.   HPI  Past Medical History  Diagnosis Date  . Hypertension 2009  . Hyperlipidemia   . GERD (gastroesophageal reflux disease)   . Colon cancer 1992    T3, N1, M0.  . Colon polyp 2011    Past Surgical History  Procedure Laterality Date  . Appendectomy    . Abdominal hysterectomy    . Colon resection    . Nephrectomy      left  . Hernia repair    . Tonsillectomy    . Colonoscopy  03-14-10    Dr Bary Castilla, tubular adenoma at 25 cm.    Family History  Problem Relation Age of Onset  . Cerebral aneurysm Father   . Colon cancer Mother     Social History History  Substance Use Topics  . Smoking status: Never Smoker   . Smokeless tobacco: Never Used  . Alcohol Use: No    Allergies  Allergen Reactions  . Morphine And Related Nausea Only    Current Outpatient Prescriptions  Medication Sig Dispense Refill  . esomeprazole (NEXIUM) 40 MG capsule Take 40 mg by mouth daily before breakfast.      . metoprolol (LOPRESSOR) 50 MG tablet Take 50 mg by mouth 2 (two) times daily.      . Multiple Vitamins-Minerals (MULTIVITAMIN WITH MINERALS) tablet Take 1 tablet by mouth daily.    . polyethylene glycol powder (GLYCOLAX/MIRALAX) powder 255 grams one bottle for colonoscopy prep 255 g 0   No current facility-administered medications for this visit.    Review of Systems Review of Systems  Constitutional: Negative.   Respiratory: Negative.   Cardiovascular: Negative.   Gastrointestinal: Negative for diarrhea, constipation and blood in stool.    Blood pressure 150/88, pulse 80, resp. rate 14, height 5\' 10"  (1.778 m), weight 244 lb (110.678 kg).  Physical Exam Physical  Exam  Constitutional: She is oriented to person, place, and time. She appears well-developed and well-nourished.  Neck: Neck supple.  Cardiovascular: Normal rate, regular rhythm and normal heart sounds.   Pulmonary/Chest: Effort normal and breath sounds normal.  Abdominal: Soft. Normal appearance. There is no tenderness. No hernia.  Abdominal incisions well healed.  Lymphadenopathy:    She has no cervical adenopathy.  Neurological: She is alert and oriented to person, place, and time.  Skin: Skin is warm and dry.    Data Reviewed 2011 colonoscopy and pathology report  Assessment    Past history colon cancer, adenomatous polyps in 2011.    Plan    The patient is a candidate for a follow-up colonoscopy.    Colonoscopy with possible biopsy/polypectomy prn: Information regarding the procedure, including its potential risks and complications (including but not limited to perforation of the bowel, which may require emergency surgery to repair, and bleeding) was verbally given to the patient. Educational information regarding lower instestinal endoscopy was given to the patient. Written instructions for how to complete the bowel prep using Miralax were provided. The importance of drinking ample fluids to avoid dehydration as a result of the prep emphasized.  Patient has been scheduled for a colonoscopy on 06-07-15 at [facility].  PCP:  Estell Harpin  Robert Bellow 04/16/2015, 9:39 AM

## 2015-04-16 ENCOUNTER — Other Ambulatory Visit: Payer: Self-pay | Admitting: General Surgery

## 2015-04-16 DIAGNOSIS — K635 Polyp of colon: Secondary | ICD-10-CM

## 2015-04-16 DIAGNOSIS — Z86010 Personal history of colonic polyps: Secondary | ICD-10-CM | POA: Insufficient documentation

## 2015-04-16 NOTE — H&P (Signed)
Patient ID: Carrie Larsen, female DOB: 01/27/1946, 69 y.o. MRN: [id number]  Chief Complaint  Patient presents with  . Colonoscopy    HPI Carrie Larsen is a 69 y.o. female. Here today to discuss having a colonoscopy. Her last one was 03-06-10. No gastrointestinal issues. Bowels move daily and no blood.   HPI  Past Medical History  Diagnosis Date  . Hypertension 2009  . Hyperlipidemia   . GERD (gastroesophageal reflux disease)   . Colon cancer 1992    T3, N1, M0.  . Colon polyp 2011    Past Surgical History  Procedure Laterality Date  . Appendectomy    . Abdominal hysterectomy    . Colon resection    . Nephrectomy      left  . Hernia repair    . Tonsillectomy    . Colonoscopy  03-14-10    Dr Bary Castilla, tubular adenoma at 25 cm.    Family History  Problem Relation Age of Onset  . Cerebral aneurysm Father   . Colon cancer Mother     Social History History  Substance Use Topics  . Smoking status: Never Smoker   . Smokeless tobacco: Never Used  . Alcohol Use: No    Allergies  Allergen Reactions  . Morphine And Related Nausea Only    Current Outpatient Prescriptions  Medication Sig Dispense Refill  . esomeprazole (NEXIUM) 40 MG capsule Take 40 mg by mouth daily before breakfast.     . metoprolol (LOPRESSOR) 50 MG tablet Take 50 mg by mouth 2 (two) times daily.     . Multiple Vitamins-Minerals (MULTIVITAMIN WITH MINERALS) tablet Take 1 tablet by mouth daily.    . polyethylene glycol powder (GLYCOLAX/MIRALAX) powder 255 grams one bottle for colonoscopy prep 255 g 0   No current facility-administered medications for this visit.    Review of Systems Review of Systems  Constitutional: Negative.  Respiratory: Negative.  Cardiovascular: Negative.  Gastrointestinal: Negative for diarrhea, constipation and blood in stool.     Blood pressure 150/88, pulse 80, resp. rate 14, height 5\' 10"  (1.778 m), weight 244 lb (110.678 kg).  Physical Exam Physical Exam  Constitutional: She is oriented to person, place, and time. She appears well-developed and well-nourished.  Neck: Neck supple.  Cardiovascular: Normal rate, regular rhythm and normal heart sounds.  Pulmonary/Chest: Effort normal and breath sounds normal.  Abdominal: Soft. Normal appearance. There is no tenderness. No hernia.  Abdominal incisions well healed.  Lymphadenopathy:   She has no cervical adenopathy.  Neurological: She is alert and oriented to person, place, and time.  Skin: Skin is warm and dry.    Data Reviewed 2011 colonoscopy and pathology report  Assessment    Past history colon cancer, adenomatous polyps in 2011.    Plan    The patient is a candidate for a follow-up colonoscopy.    Colonoscopy with possible biopsy/polypectomy prn: Information regarding the procedure, including its potential risks and complications (including but not limited to perforation of the bowel, which may require emergency surgery to repair, and bleeding) was verbally given to the patient. Educational information regarding lower instestinal endoscopy was given to the patient. Written instructions for how to complete the bowel prep using Miralax were provided. The importance of drinking ample fluids to avoid dehydration as a result of the prep emphasized.  Patient has been scheduled for a colonoscopy on 06-07-15 at [facility].  PCP: Lilyan Gilford 04/16/2015, 9:39 AM

## 2015-05-05 ENCOUNTER — Emergency Department
Admission: EM | Admit: 2015-05-05 | Discharge: 2015-05-05 | Disposition: A | Discharge status: Home / Self Care | Payer: Medicare Other | Attending: Student | Admitting: Student

## 2015-05-05 DIAGNOSIS — T162XXA Foreign body in left ear, initial encounter: Secondary | ICD-10-CM | POA: Diagnosis present

## 2015-05-05 DIAGNOSIS — Z79899 Other long term (current) drug therapy: Secondary | ICD-10-CM | POA: Diagnosis not present

## 2015-05-05 DIAGNOSIS — Y998 Other external cause status: Secondary | ICD-10-CM | POA: Insufficient documentation

## 2015-05-05 DIAGNOSIS — X58XXXA Exposure to other specified factors, initial encounter: Secondary | ICD-10-CM | POA: Insufficient documentation

## 2015-05-05 DIAGNOSIS — Y9389 Activity, other specified: Secondary | ICD-10-CM | POA: Insufficient documentation

## 2015-05-05 DIAGNOSIS — I10 Essential (primary) hypertension: Secondary | ICD-10-CM | POA: Insufficient documentation

## 2015-05-05 DIAGNOSIS — Y9289 Other specified places as the place of occurrence of the external cause: Secondary | ICD-10-CM | POA: Insufficient documentation

## 2015-05-05 NOTE — ED Notes (Signed)
Patient with no complaints at this time. Respirations even and unlabored. Skin warm/dry. Discharge instructions reviewed with patient at this time. Patient given opportunity to voice concerns/ask questions. Patient discharged at this time and left Emergency Department with steady gait.   

## 2015-05-05 NOTE — ED Notes (Signed)
PT states has had pain to left ear for 2 days went to pmd and was told to come here for foreign body in ear.

## 2015-05-05 NOTE — ED Provider Notes (Signed)
[facility] Emergency Department Provider Note  ____________________________________________  Time seen: Approximately 8:59 PM  I have reviewed the triage vital signs and the nursing notes.   HISTORY  Chief Complaint Foreign Body in [place] is a 69 y.o. female who was seen by her physician earlier today for possible foreign body in her left ear. She believes it is from earplugs which she uses periodically. His having very minimal pain. 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 No fevers or chills, neck pain or sore throat. Otherwise she is doing well.  Past Medical History  Diagnosis Date  . Hypertension 2009  . Hyperlipidemia   . GERD (gastroesophageal reflux disease)   . Colon cancer 1992    T3, N1, M0.  . Colon polyp 2011    Patient Active Problem List   Diagnosis Date Noted  . History of colonic polyps 04/16/2015  . Abdominal pain, left upper quadrant 05/11/2014  . Chest pain 03/08/2011  . Hyperlipidemia 03/08/2011  . HTN (hypertension) 03/08/2011    Past Surgical History  Procedure Laterality Date  . Appendectomy    . Abdominal hysterectomy    . Colon resection    . Nephrectomy      left  . Hernia repair    . Tonsillectomy    . Colonoscopy  03-14-10    Dr Bary Castilla, tubular adenoma at 25 cm.    Current Outpatient Rx  Name  Route  Sig  Dispense  Refill  . esomeprazole (NEXIUM) 40 MG capsule   Oral   Take 40 mg by mouth daily before breakfast.           . metoprolol (LOPRESSOR) 50 MG tablet   Oral   Take 50 mg by mouth 2 (two) times daily.           . Multiple Vitamins-Minerals (MULTIVITAMIN WITH MINERALS) tablet   Oral   Take 1 tablet by mouth daily.         . polyethylene glycol powder (GLYCOLAX/MIRALAX) powder      255 grams one bottle for colonoscopy prep   255 g   0     Allergies Morphine and related  Family History  Problem Relation Age of Onset  . Cerebral aneurysm Father   . Colon cancer  Mother     Social History History  Substance Use Topics  . Smoking status: Never Smoker   . Smokeless tobacco: Never Used  . Alcohol Use: No    Review of Systems Constitutional: No fever/chills Eyes: No visual changes. ENT: No sore throat. Cardiovascular: Denies chest pain. Respiratory: Denies shortness of breath.10-point ROS otherwise negative.  ____________________________________________   PHYSICAL EXAM:  VITAL SIGNS: ED Triage Vitals  Enc Vitals Group     BP 05/05/15 2041 155/70 mmHg     Pulse Rate 05/05/15 2041 64     Resp 05/05/15 2041 18     Temp 05/05/15 2041 97.7 F (36.5 C)     Temp Source 05/05/15 2041 Oral     SpO2 05/05/15 2041 98 %     Weight 05/05/15 2041 245 lb (111.131 kg)     Height 05/05/15 2041 5\' 10"  (1.778 m)     Head Cir --      Peak Flow --      Pain Score 05/05/15 2042 0     Pain Loc --      Pain Edu? --      Excl. in [place]? --  Constitutional: Alert and oriented. Well appearing and in no acute distress. Eyes: Conjunctivae are normal. PERRL. EOMI. Head: Atraumatic. Nose: No congestion/rhinnorhea. Ear: left: wax with light blue object covering the TM. Mouth/Throat: Mucous membranes are moist.  Oropharynx non-erythematous. Neck: supple. Hematological/Lymphatic/Immunilogical: No cervical lymphadenopathy. Cardiovascular: Normal rate, regular rhythm. Grossly normal heart sounds.  Good peripheral circulation. Respiratory: Normal respiratory effort.  No retractions. Lungs CTAB. Neurologic:  Normal speech and language. No gross focal neurologic deficits are appreciated. Speech is normal. No gait instability. Skin:  Skin is warm, dry and intact. No rash noted. Psychiatric: Mood and affect are normal. Speech and behavior are normal.  ____________________________________________   LABS (all labs ordered are listed, but only abnormal results are displayed)  Labs Reviewed - No data to  display ____________________________________________  EKG   ____________________________________________  RADIOLOGY   ____________________________________________   PROCEDURES  Procedure(s) performed: None  Critical Care performed: No  ____________________________________________   INITIAL IMPRESSION / ASSESSMENT AND PLAN / ED COURSE  Pertinent labs & imaging results that were available during my care of the patient were reviewed by me and considered in my medical decision making (see chart for details).  Foreign body in left ear. Removed a small amount of cerumen without difficulty. Object visualized against tympanic membrane. Contacted Dr. Pryor Ochoa, ENT who agreed to see the patient in the office tomorrow. Patient is agreeable to this. There are no current signs of infection. ____________________________________________   FINAL CLINICAL IMPRESSION(S) / ED DIAGNOSES  Final diagnoses:  Foreign body in ear, left, initial encounter      Mortimer Fries, PA-C 05/05/15 2103  Joanne Gavel, MD 05/06/15 0003

## 2015-05-05 NOTE — Discharge Instructions (Signed)
Ear Foreign Body An ear foreign body is an object that is stuck in the ear. Objects in the ear can cause pain, hearing loss, and buzzing or roaring sounds. They can also cause fluid to come from the ear. HOME CARE   Keep all doctor visits as told.  Keep small objects away from children. Tell them not to put things in their ears. GET HELP RIGHT AWAY IF:   You have blood coming from your ear.  You have more pain or puffiness (swelling) in the ear.  You have trouble hearing.  You have fluid (discharge) coming from the ear.  You have a fever.  You get a headache. MAKE SURE YOU:   Understand these instructions.  Will watch your condition.  Will get help right away if you are not doing well or get worse. Document Released: 05/02/2010 Document Revised: 02/04/2012 Document Reviewed: 05/02/2010 [facility] Patient Information 2015 [place], [place]. This information is not intended to replace advice given to you by your health care provider. Make sure you discuss any questions you have with your health care provider.   Contact [facility] tomorrow morning for an appointment. They should see you tomorrow.

## 2015-05-12 ENCOUNTER — Encounter: Payer: Self-pay | Admitting: Emergency Medicine

## 2015-05-12 ENCOUNTER — Emergency Department: Payer: Medicare Other

## 2015-05-12 ENCOUNTER — Emergency Department
Admission: EM | Admit: 2015-05-12 | Discharge: 2015-05-12 | Disposition: A | Discharge status: Home / Self Care | Payer: Medicare Other | Attending: Emergency Medicine | Admitting: Emergency Medicine

## 2015-05-12 DIAGNOSIS — S79911A Unspecified injury of right hip, initial encounter: Secondary | ICD-10-CM | POA: Diagnosis not present

## 2015-05-12 DIAGNOSIS — Y998 Other external cause status: Secondary | ICD-10-CM | POA: Insufficient documentation

## 2015-05-12 DIAGNOSIS — Y9389 Activity, other specified: Secondary | ICD-10-CM | POA: Insufficient documentation

## 2015-05-12 DIAGNOSIS — Z79899 Other long term (current) drug therapy: Secondary | ICD-10-CM | POA: Insufficient documentation

## 2015-05-12 DIAGNOSIS — S5011XA Contusion of right forearm, initial encounter: Secondary | ICD-10-CM | POA: Insufficient documentation

## 2015-05-12 DIAGNOSIS — T148XXA Other injury of unspecified body region, initial encounter: Secondary | ICD-10-CM

## 2015-05-12 DIAGNOSIS — Y9289 Other specified places as the place of occurrence of the external cause: Secondary | ICD-10-CM | POA: Insufficient documentation

## 2015-05-12 DIAGNOSIS — W01198A Fall on same level from slipping, tripping and stumbling with subsequent striking against other object, initial encounter: Secondary | ICD-10-CM | POA: Insufficient documentation

## 2015-05-12 DIAGNOSIS — S0003XA Contusion of scalp, initial encounter: Secondary | ICD-10-CM | POA: Diagnosis not present

## 2015-05-12 DIAGNOSIS — I10 Essential (primary) hypertension: Secondary | ICD-10-CM | POA: Diagnosis not present

## 2015-05-12 DIAGNOSIS — S0990XA Unspecified injury of head, initial encounter: Secondary | ICD-10-CM | POA: Diagnosis present

## 2015-05-12 DIAGNOSIS — W19XXXA Unspecified fall, initial encounter: Secondary | ICD-10-CM

## 2015-05-12 MED ORDER — ACETAMINOPHEN 325 MG PO TABS
ORAL_TABLET | ORAL | Status: AC
  Administered 2015-05-12: 650 mg
  Filled 2015-05-12: qty 2

## 2015-05-12 NOTE — Discharge Instructions (Signed)
Please seek medical attention for any high fevers, chest pain, shortness of breath, change in behavior, persistent vomiting, bloody stool or any other new or concerning symptoms.  Hematoma A hematoma is a collection of blood under the skin, in an organ, in a body space, in a joint space, or in other tissue. The blood can clot to form a lump that you can see and feel. The lump is often firm and may sometimes become sore and tender. Most hematomas get better in a few days to weeks. However, some hematomas may be serious and require medical care. Hematomas can range in size from very small to very large. CAUSES  A hematoma can be caused by a blunt or penetrating injury. It can also be caused by spontaneous leakage from a blood vessel under the skin. Spontaneous leakage from a blood vessel is more likely to occur in older people, especially those taking blood thinners. Sometimes, a hematoma can develop after certain medical procedures. SIGNS AND SYMPTOMS   A firm lump on the body.  Possible pain and tenderness in the area.  Bruising.Blue, dark blue, purple-red, or yellowish skin may appear at the site of the hematoma if the hematoma is close to the surface of the skin. For hematomas in deeper tissues or body spaces, the signs and symptoms may be subtle. For example, an intra-abdominal hematoma may cause abdominal pain, weakness, fainting, and shortness of breath. An intracranial hematoma may cause a headache or symptoms such as weakness, trouble speaking, or a change in consciousness. DIAGNOSIS  A hematoma can usually be diagnosed based on your medical history and a physical exam. Imaging tests may be needed if your health care provider suspects a hematoma in deeper tissues or body spaces, such as the abdomen, head, or chest. These tests may include ultrasonography or a CT scan.  TREATMENT  Hematomas usually go away on their own over time. Rarely does the blood need to be drained out of the body. Large  hematomas or those that may affect vital organs will sometimes need surgical drainage or monitoring. HOME CARE INSTRUCTIONS   Apply ice to the injured area:   Put ice in a plastic bag.   Place a towel between your skin and the bag.   Leave the ice on for 20 minutes, 2-3 times a day for the first 1 to 2 days.   After the first 2 days, switch to using warm compresses on the hematoma.   Elevate the injured area to help decrease pain and swelling. Wrapping the area with an elastic bandage may also be helpful. Compression helps to reduce swelling and promotes shrinking of the hematoma. Make sure the bandage is not wrapped too tight.   If your hematoma is on a lower extremity and is painful, crutches may be helpful for a couple days.   Only take over-the-counter or prescription medicines as directed by your health care provider. SEEK IMMEDIATE MEDICAL CARE IF:   You have increasing pain, or your pain is not controlled with medicine.   You have a fever.   You have worsening swelling or discoloration.   Your skin over the hematoma breaks or starts bleeding.   Your hematoma is in your chest or abdomen and you have weakness, shortness of breath, or a change in consciousness.  Your hematoma is on your scalp (caused by a fall or injury) and you have a worsening headache or a change in alertness or consciousness. MAKE SURE YOU:   Understand these instructions.  Will watch your condition.  Will get help right away if you are not doing well or get worse. Document Released: 06/26/2004 Document Revised: 07/15/2013 Document Reviewed: 04/22/2013 [facility] Patient Information 2015 [place], [place]. This information is not intended to replace advice given to you by your health care provider. Make sure you discuss any questions you have with your health care provider.

## 2015-05-12 NOTE — ED Provider Notes (Signed)
[facility] Emergency Department Provider Note    ____________________________________________  Time seen: 1700  I have reviewed the triage vital signs and the nursing notes.   HISTORY  Chief Complaint Fall   History limited by: Not Limited   HPI Carrie Larsen is a 69 y.o. female who presents to the emergency department today after a fall. Patient states she was walking into a store when she slipped on a wet floor. Fell back and hit the right side of her body and head against a pillar. She did not have loss of consciousness. She is complaining now of some swelling to her scalp, head pain, right forearm pain and right hip pain. The patient states she has been able to ambulate easily. She denies any blurry vision. She denies being on any blood thinners.     Past Medical History  Diagnosis Date  . Hypertension 2009  . Hyperlipidemia   . GERD (gastroesophageal reflux disease)   . Colon cancer 1992    T3, N1, M0.  . Colon polyp 2011    Patient Active Problem List   Diagnosis Date Noted  . History of colonic polyps 04/16/2015  . Abdominal pain, left upper quadrant 05/11/2014  . Chest pain 03/08/2011  . Hyperlipidemia 03/08/2011  . HTN (hypertension) 03/08/2011    Past Surgical History  Procedure Laterality Date  . Appendectomy    . Abdominal hysterectomy    . Colon resection    . Nephrectomy      left  . Hernia repair    . Tonsillectomy    . Colonoscopy  03-14-10    Dr Bary Castilla, tubular adenoma at 25 cm.    Current Outpatient Rx  Name  Route  Sig  Dispense  Refill  . esomeprazole (NEXIUM) 40 MG capsule   Oral   Take 40 mg by mouth daily before breakfast.           . metoprolol (LOPRESSOR) 50 MG tablet   Oral   Take 50 mg by mouth 2 (two) times daily.           . Multiple Vitamins-Minerals (MULTIVITAMIN WITH MINERALS) tablet   Oral   Take 1 tablet by mouth daily.         . polyethylene glycol powder (GLYCOLAX/MIRALAX)  powder      255 grams one bottle for colonoscopy prep   255 g   0     Allergies Morphine and related  Family History  Problem Relation Age of Onset  . Cerebral aneurysm Father   . Colon cancer Mother     Social History History  Substance Use Topics  . Smoking status: Never Smoker   . Smokeless tobacco: Never Used  . Alcohol Use: No    Review of Systems  Constitutional: Negative for fever. Cardiovascular: Negative for chest pain. Respiratory: Negative for shortness of breath. Gastrointestinal: Negative for abdominal pain, vomiting and diarrhea. Genitourinary: Negative for dysuria. Musculoskeletal: Right hip pain, right forearm pain, scalp hematoma Skin: Negative for rash. Neurological: Negative for headaches, focal weakness or numbness.   10-point ROS otherwise negative.  ____________________________________________   PHYSICAL EXAM:  VITAL SIGNS: ED Triage Vitals  Enc Vitals Group     BP 05/12/15 1548 122/69 mmHg     Pulse Rate 05/12/15 1548 86     Resp 05/12/15 1548 22     Temp 05/12/15 1548 98.1 F (36.7 C)     Temp Source 05/12/15 1548 Oral     SpO2 05/12/15 1548  95 %     Weight 05/12/15 1548 245 lb (111.131 kg)     Height 05/12/15 1548 5\' 10"  (1.778 m)     Head Cir --      Peak Flow --      Pain Score 05/12/15 1554 8   Constitutional: Alert and oriented. Well appearing and in no distress. Eyes: Conjunctivae are normal. PERRL. Normal extraocular movements. ENT   Head: Normocephalic. Small hematoma to right occiput with small amount of ecchymosis. No laceration.   Nose: No congestion/rhinnorhea.   Mouth/Throat: Mucous membranes are moist.   Neck: No stridor. No midline tenderness. Painless range of motion. Hematological/Lymphatic/Immunilogical: No cervical lymphadenopathy. Cardiovascular: Normal rate, regular rhythm.  No murmurs, rubs, or gallops. Respiratory: Normal respiratory effort without tachypnea nor retractions. Breath sounds  are clear and equal bilaterally. No wheezes/rales/rhonchi. Gastrointestinal: Soft and nontender. No distention.  Genitourinary: Deferred Musculoskeletal: Normal range of motion in all extremities. No hip tenderness. Gait normal. Small contusion to right forearm however no bony tenderness and neurovascularly intact distally. Neurologic:  Normal speech and language. No gross focal neurologic deficits are appreciated. Speech is normal.  Skin:  Skin is warm, dry and intact. No rash noted. Psychiatric: Mood and affect are normal. Speech and behavior are normal. Patient exhibits appropriate insight and judgment.  ____________________________________________    LABS (pertinent positives/negatives)  None  ____________________________________________   EKG  None  ____________________________________________    RADIOLOGY  CT head  IMPRESSION: No acute intracranial abnormalities. ____________________________________________   PROCEDURES  Procedure(s) performed: None  Critical Care performed: No  ____________________________________________   INITIAL IMPRESSION / ASSESSMENT AND PLAN / ED COURSE  Pertinent labs & imaging results that were available during my care of the patient were reviewed by me and considered in my medical decision making (see chart for details).  Patient presented to the emergency department today with complaints of a scalp hematoma, right forearm and right hip pain. CT head did not show any fracture or intracranial bleed. Patient does have a small hematoma on physical exam. Small contusion to right forearm however neurovascularly intact and without bony tenderness. Additionally patient without any hip tenderness and was able to able to normally. Do not feel that emergent radiographs of the arm or hip required at this time. Feel the patient is safe for discharge home. ____________________________________________   FINAL CLINICAL IMPRESSION(S) / ED  DIAGNOSES  Final diagnoses:  Fall, initial encounter  Scalp hematoma, initial encounter  Contusion     Nance Pear, MD 05/12/15 1715

## 2015-05-12 NOTE — ED Notes (Signed)
Pt reports that she was walking into [facility], slipped and fell on the wet floor. She states that she hit her head on the metal pole. No LOC does not take blood thinners. She is complaining of right hip pain, head, right arm pain. She does have a hematoma on the back of her head.

## 2015-05-12 NOTE — ED Notes (Signed)
Walked into [employer] and [employer] on mat and it slipped, she fell, no loc, c/o right hip pain, hit back of head, denies blurred vision, walked without problems from triage

## 2015-06-02 ENCOUNTER — Telehealth: Payer: Self-pay | Admitting: *Deleted

## 2015-06-02 NOTE — Telephone Encounter (Signed)
Message for patient to call the office.   We need to confirm she has had no medication changes since last office visit. Also, verify she has picked up Miralax prescription.

## 2015-06-03 ENCOUNTER — Telehealth: Payer: Self-pay

## 2015-06-03 NOTE — Telephone Encounter (Signed)
Patient called back to state that she has had no medication changes and has picked up her Miralax prescription.

## 2015-06-07 ENCOUNTER — Ambulatory Visit
Admission: RE | Admit: 2015-06-07 | Discharge: 2015-06-07 | Disposition: A | Discharge status: Home / Self Care | Payer: Medicare Other | Source: Ambulatory Visit | Attending: General Surgery | Admitting: General Surgery

## 2015-06-07 ENCOUNTER — Encounter
Admission: RE | Disposition: A | Discharge status: Home / Self Care | Payer: Self-pay | Source: Ambulatory Visit | Attending: General Surgery

## 2015-06-07 ENCOUNTER — Encounter: Payer: Self-pay | Admitting: *Deleted

## 2015-06-07 ENCOUNTER — Ambulatory Visit: Payer: Medicare Other | Admitting: Anesthesiology

## 2015-06-07 DIAGNOSIS — K219 Gastro-esophageal reflux disease without esophagitis: Secondary | ICD-10-CM | POA: Insufficient documentation

## 2015-06-07 DIAGNOSIS — Z1211 Encounter for screening for malignant neoplasm of colon: Secondary | ICD-10-CM | POA: Diagnosis present

## 2015-06-07 DIAGNOSIS — E785 Hyperlipidemia, unspecified: Secondary | ICD-10-CM | POA: Insufficient documentation

## 2015-06-07 DIAGNOSIS — I10 Essential (primary) hypertension: Secondary | ICD-10-CM | POA: Diagnosis not present

## 2015-06-07 DIAGNOSIS — Z85038 Personal history of other malignant neoplasm of large intestine: Secondary | ICD-10-CM | POA: Insufficient documentation

## 2015-06-07 DIAGNOSIS — Z9049 Acquired absence of other specified parts of digestive tract: Secondary | ICD-10-CM

## 2015-06-07 DIAGNOSIS — I252 Old myocardial infarction: Secondary | ICD-10-CM | POA: Diagnosis not present

## 2015-06-07 HISTORY — PX: APPENDECTOMY: SHX54

## 2015-06-07 HISTORY — DX: Acquired absence of other specified parts of digestive tract: Z90.49

## 2015-06-07 HISTORY — PX: COLONOSCOPY WITH PROPOFOL: SHX5780

## 2015-06-07 SURGERY — COLONOSCOPY WITH PROPOFOL
Anesthesia: General

## 2015-06-07 MED ORDER — FENTANYL CITRATE (PF) 100 MCG/2ML IJ SOLN
INTRAMUSCULAR | Status: DC | PRN
  Administered 2015-06-07: 50 ug via INTRAVENOUS

## 2015-06-07 MED ORDER — SODIUM CHLORIDE 0.9 % IV SOLN
INTRAVENOUS | Status: DC
  Administered 2015-06-07: 1000 mL via INTRAVENOUS

## 2015-06-07 MED ORDER — PROPOFOL INFUSION 10 MG/ML OPTIME
INTRAVENOUS | Status: DC | PRN
  Administered 2015-06-07: 120 ug/kg/min via INTRAVENOUS

## 2015-06-07 MED ORDER — MIDAZOLAM HCL 2 MG/2ML IJ SOLN
INTRAMUSCULAR | Status: DC | PRN
  Administered 2015-06-07: 1 mg via INTRAVENOUS

## 2015-06-07 NOTE — H&P (Signed)
  69 y/o female with previous colon cancer. For screening exam. Reports feeling well since May 2016 exam. Lungs: Clear. Cardio: RR. HEENT: Neg. Reports she tolerated the prep well. Plan: Colonoscopy.

## 2015-06-07 NOTE — Op Note (Signed)
[facility] Patient Name: Carrie Larsen Procedure Date: 06/07/2015 1:05 PM MRN: [id number] Account #: [id number] Date of Birth: 13-Feb-1946 Admit Type: Outpatient Age: 69 Room: [facility] ENDO ROOM 4 Gender: Female Note Status: Finalized Procedure:         Colonoscopy Indications:       High risk colon cancer surveillance: Personal history of                     colon cancer Providers:         Robert Bellow, MD Referring MD:      Terance Hart. Tobin Chad (Referring MD) Medicines:         Monitored Anesthesia Care Complications:     No immediate complications. Procedure:         Pre-Anesthesia Assessment:                    - Prior to the procedure, a History and Physical was                     performed, and patient medications, allergies and                     sensitivities were reviewed. The patient's tolerance of                     previous anesthesia was reviewed.                    - The risks and benefits of the procedure and the sedation                     options and risks were discussed with the patient. All                     questions were answered and informed consent was obtained.                    After obtaining informed consent, the colonoscope was                     passed under direct vision. Throughout the procedure, the                     patient's blood pressure, pulse, and oxygen saturations                     were monitored continuously. The Colonoscope was                     introduced through the anus and advanced to the the cecum,                     identified by the appendiceal orifice, IC valve and                     transillumination. The colonoscopy was performed without                     difficulty. The patient tolerated the procedure well. The                     quality of the bowel preparation was excellent. Findings:      The entire examined colon appeared normal on direct and retroflexion  views. Impression:        - The entire examined colon is normal on direct and                     retroflexion views.                    - No specimens collected. Recommendation:    - Repeat colonoscopy in 5 years for surveillance. Procedure Code(s): --- Professional ---                    [id number], Colonoscopy, flexible; diagnostic, including                     collection of specimen(s) by brushing or washing, when                     performed (separate procedure) Diagnosis Code(s): --- Professional ---                    U02.334, Personal history of other malignant neoplasm of                     large intestine CPT copyright 2014 American Medical Association. All rights reserved. The codes documented in this report are preliminary and upon coder review may  be revised to meet current compliance requirements. Robert Bellow, MD 06/07/2015 1:35:18 PM This report has been signed electronically. Number of Addenda: 0 Note Initiated On: 06/07/2015 1:05 PM Scope Withdrawal Time: 0 hours 8 minutes 32 seconds  Total Procedure Duration: 0 hours 14 minutes 9 seconds       [facility]

## 2015-06-07 NOTE — Anesthesia Preprocedure Evaluation (Signed)
Anesthesia Evaluation  Patient identified by MRN, date of birth, ID band Patient awake    Reviewed: Allergy & Precautions, H&P , NPO status , Patient's Chart, lab work & pertinent test results, reviewed documented beta blocker date and time   Airway Mallampati: II  TM Distance: >3 FB Neck ROM: full    Dental no notable dental hx. (+) Teeth Intact   Pulmonary neg pulmonary ROS,  breath sounds clear to auscultation  Pulmonary exam normal       Cardiovascular Exercise Tolerance: Good hypertension, - Past MI Normal cardiovascular examRhythm:regular Rate:Normal     Neuro/Psych negative neurological ROS  negative psych ROS   GI/Hepatic negative GI ROS, Neg liver ROS, GERD-  Medicated and Controlled,  Endo/Other  negative endocrine ROS  Renal/GU negative Renal ROS  negative genitourinary   Musculoskeletal   Abdominal   Peds  Hematology negative hematology ROS (+)   Anesthesia Other Findings Past Medical History:   Hypertension                                    2009         Hyperlipidemia                                               GERD (gastroesophageal reflux disease)                       Colon cancer                                    1992           Comment:T3, N1, M0.   Colon polyp                                     2011         Reproductive/Obstetrics negative OB ROS                             Anesthesia Physical Anesthesia Plan  ASA: III  Anesthesia Plan: General   Post-op Pain Management:    Induction:   Airway Management Planned:   Additional Equipment:   Intra-op Plan:   Post-operative Plan:   Informed Consent: I have reviewed the patients History and Physical, chart, labs and discussed the procedure including the risks, benefits and alternatives for the proposed anesthesia with the patient or authorized representative who has indicated his/her understanding and  acceptance.   Dental Advisory Given  Plan Discussed with: Anesthesiologist, CRNA and Surgeon  Anesthesia Plan Comments:         Anesthesia Quick Evaluation

## 2015-06-07 NOTE — Transfer of Care (Signed)
Immediate Anesthesia Transfer of Care Note  Patient: Carrie Larsen  Procedure(s) Performed: Procedure(s): COLONOSCOPY WITH PROPOFOL (N/A)  Patient Location: PACU  Anesthesia Type:General  Level of Consciousness: awake and sedated  Airway & Oxygen Therapy: Patient Spontanous Breathing and Patient connected to nasal cannula oxygen  Post-op Assessment: Report given to RN and Post -op Vital signs reviewed and stable  Post vital signs: Reviewed and stable  Last Vitals:  Filed Vitals:   06/07/15 1242  BP: 154/84  Pulse: 67  Temp: 36.3 C  Resp: 18    Complications: No apparent anesthesia complications

## 2015-06-07 NOTE — Anesthesia Procedure Notes (Signed)
Performed by: COOK-MARTIN, Khrystal Jeanmarie Pre-anesthesia Checklist: Patient identified, Emergency Drugs available, Suction available, Patient being monitored and Timeout performed Patient Re-evaluated:Patient Re-evaluated prior to inductionOxygen Delivery Method: Nasal cannula Preoxygenation: Pre-oxygenation with 100% oxygen Intubation Type: IV induction Placement Confirmation: positive ETCO2 and CO2 detector       

## 2015-06-07 NOTE — Anesthesia Postprocedure Evaluation (Signed)
  Anesthesia Post-op Note  Patient: Carrie Larsen  Procedure(s) Performed: Procedure(s): COLONOSCOPY WITH PROPOFOL (N/A)  Anesthesia type:General  Patient location: PACU  Post pain: Pain level controlled  Post assessment: Post-op Vital signs reviewed, Patient's Cardiovascular Status Stable, Respiratory Function Stable, Patent Airway and No signs of Nausea or vomiting  Post vital signs: Reviewed and stable  Last Vitals:  Filed Vitals:   06/07/15 1410  BP: 132/75  Pulse: 59  Temp:   Resp: 13    Level of consciousness: awake, alert  and patient cooperative  Complications: No apparent anesthesia complications

## 2015-06-08 ENCOUNTER — Encounter: Payer: Self-pay | Admitting: General Surgery

## 2015-07-04 ENCOUNTER — Encounter: Payer: Self-pay | Admitting: General Surgery

## 2015-07-04 ENCOUNTER — Ambulatory Visit (INDEPENDENT_AMBULATORY_CARE_PROVIDER_SITE_OTHER): Payer: Medicare Other | Admitting: General Surgery

## 2015-07-04 VITALS — BP 140/82 | HR 74 | Resp 14 | Ht 70.0 in | Wt 245.0 lb

## 2015-07-04 DIAGNOSIS — Z85038 Personal history of other malignant neoplasm of large intestine: Secondary | ICD-10-CM | POA: Diagnosis not present

## 2015-07-04 DIAGNOSIS — R1013 Epigastric pain: Secondary | ICD-10-CM | POA: Diagnosis not present

## 2015-07-04 NOTE — Patient Instructions (Addendum)
Labs ordered, schedule [place].   Patient to have an abdominal ultrasound at [facility] on 07-05-15 at 10 am (arrive 9:30 am). Prep: NPO after midnight.  Take one Tramadol every six hours as needed for pain

## 2015-07-04 NOTE — Progress Notes (Signed)
Patient ID: Carrie Larsen, female   DO: 04/10/1946, 69 y.o.   MRM: [id number]  Chief Complaint  Patient presents with  . Other    gallbladder    HPI Carrie Larsen is a 69 y.o. female here today for a evaluation of her gallbladder. Patient states she has been having epigastric pain for about two weeks. Dull ache constantly located central lower sternal area and xiphoid region .She states she has been having a lot more buirping. Colonoscopy with propofol done on 06/07/15-normal exam. Denies any nausea or vomiting. No fever or chills. HPI  Past Medical History  Diagnosis Date  . Hypertension 2009  . Hyperlipidemia   . GERD (gastroesophageal reflux disease)   . Colon polyp 2011  . Colon cancer 1992    T3, N1, M0.  . Cancer     left kidney, partial    Past Surgical History  Procedure Laterality Date  . Appendectomy    . Abdominal hysterectomy    . Colon resection    . Nephrectomy      left  . Hernia repair    . Tonsillectomy    . Colonoscopy  03-14-10    Dr Bary Castilla, tubular adenoma at 25 cm.  . Colonoscopy with propofol N/A 06/07/2015    Procedure: COLONOSCOPY WITH PROPOFOL;  Surgeon: Robert Bellow, MD;  Location: [facility] ENDOSCOPY;  Service: Endoscopy;  Laterality: N/A;    Family History  Problem Relation Age of Onset  . Cerebral aneurysm Father   . Colon cancer Mother     Social History History  Substance Use Topics  . Smoking status: Never Smoker   . Smokeless tobacco: Never Used  . Alcohol Use: No    Allergies  Allergen Reactions  . Morphine And Related Nausea Only    Current Outpatient Prescriptions  Medication Sig Dispense Refill  . esomeprazole (NEXIUM) 40 MG capsule Take 40 mg by mouth daily before breakfast.      . metoprolol (LOPRESSOR) 50 MG tablet Take 50 mg by mouth 2 (two) times daily.      . Multiple Vitamins-Minerals (MULTIVITAMIN WITH MINERALS) tablet Take 1 tablet by mouth daily.     No current facility-administered medications for this  visit.    Review of Systems Review of Systems  Constitutional: Negative.   Respiratory: Negative.   Cardiovascular: Negative.   Gastrointestinal: Positive for abdominal pain. Negative for blood in stool.    Blood pressure 140/82, pulse 74, resp. rate 14, height 5\' 10"  (1.778 m), weight 245 lb (111.131 kg).  Physical Exam Physical Exam  Constitutional: She is oriented to person, place, and time. She appears well-developed and well-nourished.  Eyes: Conjunctivae are normal. No scleral icterus.  Neck: Neck supple.  Cardiovascular: Normal rate, regular rhythm and normal heart sounds.   Pulmonary/Chest: Effort normal and breath sounds normal.  Abdominal: Soft. Normal appearance and bowel sounds are normal. There is tenderness in the epigastric area.  Lymphadenopathy:    She has no cervical adenopathy.  Neurological: She is alert and oriented to person, place, and time.  Skin: Skin is warm and dry.    Data Reviewed  prior notes   Assessment    Epigastric abdominal pain no apparent cause.Pt is already on Nexium. Will get labs and abdominal US. Discussed fully with patient      Plan    Order CBC,Liver functions, lipase and schedule US abdomen. Rx Tramadol 50mg  #20 take 1 po q6hrs prn for pain     PCP:  Tobin Chad  SANKAR,SEEPLAPUTHUR G 07/04/2015, 10:16 AM

## 2015-07-05 ENCOUNTER — Ambulatory Visit
Admission: RE | Admit: 2015-07-05 | Discharge: 2015-07-05 | Disposition: A | Discharge status: Home / Self Care | Payer: Medicare Other | Source: Ambulatory Visit | Attending: General Surgery | Admitting: General Surgery

## 2015-07-05 DIAGNOSIS — R1013 Epigastric pain: Secondary | ICD-10-CM

## 2015-07-05 DIAGNOSIS — Z85038 Personal history of other malignant neoplasm of large intestine: Secondary | ICD-10-CM | POA: Insufficient documentation

## 2015-07-05 DIAGNOSIS — Z905 Acquired absence of kidney: Secondary | ICD-10-CM | POA: Insufficient documentation

## 2015-07-05 DIAGNOSIS — N281 Cyst of kidney, acquired: Secondary | ICD-10-CM | POA: Insufficient documentation

## 2015-07-05 LAB — HEPATIC FUNCTION PANEL
ALT: 34 IU/L — ABNORMAL HIGH (ref 0–32)
AST: 25 IU/L (ref 0–40)
Albumin: 4.4 g/dL (ref 3.6–4.8)
Alkaline Phosphatase: 121 IU/L — ABNORMAL HIGH (ref 39–117)
Bilirubin Total: 0.5 mg/dL (ref 0.0–1.2)
Bilirubin, Direct: 0.12 mg/dL (ref 0.00–0.40)
Total Protein: 7 g/dL (ref 6.0–8.5)

## 2015-07-05 LAB — CBC WITH DIFFERENTIAL/PLATELET
Basophils Absolute: 0.1 10*3/uL (ref 0.0–0.2)
Basos: 1 %
EOS (ABSOLUTE): 0.2 10*3/uL (ref 0.0–0.4)
Eos: 3 %
Hematocrit: 41.5 % (ref 34.0–46.6)
Hemoglobin: 14.4 g/dL (ref 11.1–15.9)
Immature Grans (Abs): 0 10*3/uL (ref 0.0–0.1)
Immature Granulocytes: 0 %
Lymphocytes Absolute: 2.9 10*3/uL (ref 0.7–3.1)
Lymphs: 38 %
MCH: 29.6 pg (ref 26.6–33.0)
MCHC: 34.7 g/dL (ref 31.5–35.7)
MCV: 85 fL (ref 79–97)
Monocytes Absolute: 0.7 10*3/uL (ref 0.1–0.9)
Monocytes: 9 %
Neutrophils Absolute: 3.8 10*3/uL (ref 1.4–7.0)
Neutrophils: 49 %
Platelets: 332 10*3/uL (ref 150–379)
RBC: 4.87 x10E6/uL (ref 3.77–5.28)
RDW: 13.8 % (ref 12.3–15.4)
WBC: 7.6 10*3/uL (ref 3.4–10.8)

## 2015-07-05 LAB — LIPASE: Lipase: 39 U/L (ref 0–59)

## 2015-07-06 ENCOUNTER — Telehealth: Payer: Self-pay | Admitting: *Deleted

## 2015-07-06 NOTE — Telephone Encounter (Signed)
Patient notified as instructed. She verbalizes understanding.   Appointment with Dr. Bary Castilla on 07-07-15.

## 2015-07-06 NOTE — Telephone Encounter (Signed)
-----   Message from Christene Lye, MD sent at 07/05/2015  3:29 PM EDT ----- Labs are essentially normal. [place] likewise was normal.  Please inform pt and arrange f/u with Dr, Bary Castilla

## 2015-07-06 NOTE — Telephone Encounter (Signed)
Message left for patient to call the office.

## 2015-07-07 ENCOUNTER — Encounter: Payer: Self-pay | Admitting: General Surgery

## 2015-07-07 ENCOUNTER — Ambulatory Visit (INDEPENDENT_AMBULATORY_CARE_PROVIDER_SITE_OTHER): Payer: Medicare Other | Admitting: General Surgery

## 2015-07-07 DIAGNOSIS — R072 Precordial pain: Secondary | ICD-10-CM

## 2015-07-07 NOTE — Progress Notes (Signed)
Patient ID: Carrie Larsen, female   DOB: 1946/05/10, 69 y.o.   MRN: [id number]  Chief Complaint  Patient presents with  . Follow-up    epigastric pain     HPI Carrie Larsen is a 69 y.o. female here today for a evaluation of epigastric pain. She was evaluated by Dr Jamal Collin 3 days ago in my absence and laboratory studies an abdominal ultrasound obtained. The studies were all normal. Patient states she has been having retrosternal pain described as "pressure" on and off last 4-5 hours for about two weeks (3-4 episodes). These episodes typically occur in the early morning hours and prevent her from returning to sleep. She reports that her severe, but there has been no diaphoresis radiation to the neck or arm or other symptoms directly related to a cardiac source. Dull ache constantly located central lower sternal area and xiphoid region .She states she has been associated with burping. She drinks soda and eats crackers to help. She does feel better after belching.   Colonoscopy with propofol done on 06/07/15-normal exam. Denies any nausea or vomiting. No fever or chills.    HPI  Past Medical History  Diagnosis Date  . Hypertension 2009  . Hyperlipidemia   . GERD (gastroesophageal reflux disease)   . Colon polyp 2011  . Colon cancer 1992    T3, N1, M0.  . Cancer     left kidney, partial    Past Surgical History  Procedure Laterality Date  . Appendectomy    . Abdominal hysterectomy    . Colon resection    . Nephrectomy      left  . Hernia repair    . Tonsillectomy    . Colonoscopy  03-14-10    Dr Bary Castilla, tubular adenoma at 25 cm.  . Colonoscopy with propofol N/A 06/07/2015    Procedure: COLONOSCOPY WITH PROPOFOL;  Surgeon: Robert Bellow, MD;  Location: [facility] ENDOSCOPY;  Service: Endoscopy;  Laterality: N/A;    Family History  Problem Relation Age of Onset  . Cerebral aneurysm Father   . Colon cancer Mother     Social History Social History  Substance Use Topics  .  Smoking status: Never Smoker   . Smokeless tobacco: Never Used  . Alcohol Use: No    Allergies  Allergen Reactions  . Morphine And Related Nausea Only    Current Outpatient Prescriptions  Medication Sig Dispense Refill  . naproxen (NAPROSYN) 500 MG tablet Take 500 mg by mouth daily.    Marland Kitchen esomeprazole (NEXIUM) 40 MG capsule Take 40 mg by mouth daily before breakfast.      . metoprolol (LOPRESSOR) 50 MG tablet Take 50 mg by mouth 2 (two) times daily.      . Multiple Vitamins-Minerals (MULTIVITAMIN WITH MINERALS) tablet Take 1 tablet by mouth daily.     No current facility-administered medications for this visit.    Review of Systems Review of Systems  Constitutional: Negative.   Respiratory: Negative.   Cardiovascular: Negative.   Gastrointestinal: Positive for abdominal pain. Negative for nausea, vomiting and diarrhea.    Blood pressure 136/82, pulse 78, resp. rate 14, height 5\' 10"  (1.778 m), weight 247 lb (112.038 kg).  Physical Exam Physical Exam  Constitutional: She is oriented to person, place, and time. She appears well-developed and well-nourished.  HENT:  Mouth/Throat: Oropharynx is clear and moist.  Eyes: Conjunctivae are normal. No scleral icterus.  Neck: Neck supple.  Cardiovascular: Normal rate.   Murmur heard.  Systolic murmur is  present with a grade of 2/6  Pulmonary/Chest: Effort normal and breath sounds normal.  Abdominal: Soft. Normal appearance and bowel sounds are normal. There is no tenderness.  Lymphadenopathy:    She has no cervical adenopathy.  Neurological: She is alert and oriented to person, place, and time.  Skin: Skin is warm and dry.  Psychiatric: Her behavior is normal.    Data Reviewed Recently completed ultrasound and laboratory studies were reviewed. CBC is normal. There is minimal elevation of the alkaline phosphatase, less than 2 years ago and modest elevation of the SGPT likely secondary to fatty infiltration based on the patient's  weight.  Assessment    Retrosternal chest pain without discernible esophageal or intra-abdominal source.    Plan    The patient has been followed by cardiology in the past and a reassessment is appropriate. Her main risk factor for cardiovascular diseases her weight. Her symptoms could represent esophageal spasm, but are fairly severe with no change in her reflux symptoms and ongoing use of Nexium. If her cardiac evaluation is negative, upper endoscopy may be appropriate. The patient has been seen by Ida Rogue, M.D. in the past and his practice will be contacted for assessment.    Refer to Dr Rockey Situ for retrosternal chest pain.  Patient has been scheduled for an appointment with Dr. Josue Hector for 07-14-15 at 10:30 am. Staff reports Dr. Rockey Situ does not have an opening until the end of September.   PCP:  Lilyan Gilford 07/08/2015, 6:00 AM

## 2015-07-07 NOTE — Patient Instructions (Addendum)
Refer to Dr Rockey Situ for retrosternal chest pain.  The patient is aware to call back for any questions or concerns.  Patient has been scheduled for an appointment with Dr. Josue Hector for 07-14-15 at 10:30 am. Staff reports Dr. Rockey Situ does not have an opening until the end of September.

## 2015-07-08 DIAGNOSIS — R072 Precordial pain: Secondary | ICD-10-CM | POA: Insufficient documentation

## 2015-07-14 ENCOUNTER — Encounter: Payer: Medicare Other | Admitting: Cardiovascular Disease

## 2015-07-14 ENCOUNTER — Telehealth: Payer: Self-pay | Admitting: *Deleted

## 2015-07-14 ENCOUNTER — Emergency Department
Admission: EM | Admit: 2015-07-14 | Discharge: 2015-07-14 | Disposition: A | Discharge status: Home / Self Care | Payer: Medicare Other | Attending: Emergency Medicine | Admitting: Emergency Medicine

## 2015-07-14 ENCOUNTER — Encounter: Payer: Self-pay | Admitting: Emergency Medicine

## 2015-07-14 ENCOUNTER — Emergency Department: Payer: Medicare Other

## 2015-07-14 DIAGNOSIS — Z90710 Acquired absence of both cervix and uterus: Secondary | ICD-10-CM | POA: Diagnosis not present

## 2015-07-14 DIAGNOSIS — K5900 Constipation, unspecified: Secondary | ICD-10-CM

## 2015-07-14 DIAGNOSIS — I10 Essential (primary) hypertension: Secondary | ICD-10-CM | POA: Insufficient documentation

## 2015-07-14 DIAGNOSIS — Z9049 Acquired absence of other specified parts of digestive tract: Secondary | ICD-10-CM | POA: Diagnosis not present

## 2015-07-14 DIAGNOSIS — R109 Unspecified abdominal pain: Secondary | ICD-10-CM | POA: Diagnosis present

## 2015-07-14 LAB — URINALYSIS COMPLETE WITH MICROSCOPIC (ARMC ONLY)
Bacteria, UA: NONE SEEN
Bilirubin Urine: NEGATIVE
Glucose, UA: NEGATIVE mg/dL
Hgb urine dipstick: NEGATIVE
Ketones, ur: NEGATIVE mg/dL
Leukocytes, UA: NEGATIVE
Nitrite: NEGATIVE
Protein, ur: NEGATIVE mg/dL
Specific Gravity, Urine: 1.016 (ref 1.005–1.030)
pH: 7 (ref 5.0–8.0)

## 2015-07-14 LAB — COMPREHENSIVE METABOLIC PANEL
ALT: 33 U/L (ref 14–54)
AST: 36 U/L (ref 15–41)
Albumin: 4.2 g/dL (ref 3.5–5.0)
Alkaline Phosphatase: 100 U/L (ref 38–126)
Anion gap: 6 (ref 5–15)
BUN: 17 mg/dL (ref 6–20)
CO2: 27 mmol/L (ref 22–32)
Calcium: 9.8 mg/dL (ref 8.9–10.3)
Chloride: 106 mmol/L (ref 101–111)
Creatinine, Ser: 0.77 mg/dL (ref 0.44–1.00)
GFR calc Af Amer: >60 mL/min (ref >60)
GFR calc non Af Amer: >60 mL/min (ref >60)
Glucose, Bld: 112 mg/dL — ABNORMAL HIGH (ref 65–99)
Potassium: 4.7 mmol/L (ref 3.5–5.1)
Sodium: 139 mmol/L (ref 135–145)
Total Bilirubin: 0.5 mg/dL (ref 0.3–1.2)
Total Protein: 7 g/dL (ref 6.5–8.1)

## 2015-07-14 LAB — CBC
HCT: 44 % (ref 35.0–47.0)
Hemoglobin: 14.4 g/dL (ref 12.0–16.0)
MCH: 28.8 pg (ref 26.0–34.0)
MCHC: 32.8 g/dL (ref 32.0–36.0)
MCV: 87.8 fL (ref 80.0–100.0)
Platelets: 293 10*3/uL (ref 150–440)
RBC: 5 MIL/uL (ref 3.80–5.20)
RDW: 13.6 % (ref 11.5–14.5)
WBC: 7.6 10*3/uL (ref 3.6–11.0)

## 2015-07-14 LAB — LIPASE, BLOOD: Lipase: 27 U/L (ref 22–51)

## 2015-07-14 NOTE — Progress Notes (Deleted)
Cardiology Office Note   Date:  07/14/2015   ID:  Carrie Larsen [place], [place] 1946-04-19, MRN [id number]  PCP:  Lavonne Chick, MD  Cardiologist:   Esmond Plants , MD   Chief Complaint  Patient presents with  . Hyperlipidemia   Problem list: 1. Hyperlipidemia-  2. Essential hypertension   History of Present Illness: Carrie Larsen is a 69 y.o. female who presents for follow-up of her hyperlipidemia. She's been seen  by Dr. Rockey Situ.  Location: ***. Quality: *** Duration: *** Timing: *** Associated signs and symptoms: *** Modifying factors: *** Context: ***  Past Medical History  Diagnosis Date  . Hypertension 2009  . Hyperlipidemia   . GERD (gastroesophageal reflux disease)   . Colon polyp 2011  . Colon cancer 1992    T3, N1, M0.  . Cancer     left kidney, partial    Past Surgical History  Procedure Laterality Date  . Appendectomy    . Abdominal hysterectomy    . Colon resection    . Nephrectomy      left  . Hernia repair    . Tonsillectomy    . Colonoscopy  03-14-10    Dr Bary Castilla, tubular adenoma at 25 cm.  . Colonoscopy with propofol N/A 06/07/2015    Procedure: COLONOSCOPY WITH PROPOFOL;  Surgeon: Robert Bellow, MD;  Location: [facility] ENDOSCOPY;  Service: Endoscopy;  Laterality: N/A;     Current Outpatient Prescriptions  Medication Sig Dispense Refill  . esomeprazole (NEXIUM) 40 MG capsule Take 40 mg by mouth daily before breakfast.      . metoprolol (LOPRESSOR) 50 MG tablet Take 50 mg by mouth 2 (two) times daily.      . Multiple Vitamins-Minerals (MULTIVITAMIN WITH MINERALS) tablet Take 1 tablet by mouth daily.    . naproxen (NAPROSYN) 500 MG tablet Take 500 mg by mouth daily.     No current facility-administered medications for this visit.    Allergies:   Morphine and related    Social History:  The patient  reports that she has never smoked. She has never used smokeless tobacco. She reports that she does not drink alcohol or use illicit drugs.    Family History:  The patient's ***family history includes Cerebral aneurysm in her father; Colon cancer in her mother.    ROS:  Please see the history of present illness.    Review of Systems: Constitutional:  {Actions; denies/reports/admits to:19208} fever, chills, diaphoresis, appetite change and fatigue.  HEENT: {Actions; denies/reports/admits to:19208} photophobia, eye pain, redness, hearing loss, ear pain, congestion, sore throat, rhinorrhea, sneezing, neck pain, neck stiffness and tinnitus.  Respiratory: {Actions; denies/reports/admits to:19208} SOB, DOE, cough, chest tightness, and wheezing.  Cardiovascular: {Actions; denies/reports/admits to:19208} chest pain, palpitations and leg swelling.  Gastrointestinal: {Actions; denies/reports/admits to:19208} nausea, vomiting, abdominal pain, diarrhea, constipation, blood in stool.  Genitourinary: {Actions; denies/reports/admits to:19208} dysuria, urgency, frequency, hematuria, flank pain and difficulty urinating.  Musculoskeletal: {Actions; denies/reports/admits to:19208}  myalgias, back pain, joint swelling, arthralgias and gait problem.   Skin: {Actions; denies/reports/admits to:19208} pallor, rash and wound.  Neurological: {Actions; denies/reports/admits to:19208} dizziness, seizures, syncope, weakness, light-headedness, numbness and headaches.   Hematological: {Actions; denies/reports/admits to:19208} adenopathy, easy bruising, personal or family bleeding history.  Psychiatric/ Behavioral: {Actions; denies/reports/admits to:19208} suicidal ideation, mood changes, confusion, nervousness, sleep disturbance and agitation.       All other systems are reviewed and negative.    PHYSICAL EXAM: VS:  There were no vitals taken for this visit. , BMI  There is no weight on file to calculate BMI. GEN: Well nourished, well developed, in no acute distress HEENT: normal Neck: no JVD, carotid bruits, or masses Cardiac: ***RRR; no murmurs, rubs,  or gallops,no edema  Respiratory:  clear to auscultation bilaterally, normal work of breathing GI: soft, nontender, nondistended, + BS MS: no deformity or atrophy Skin: warm and dry, no rash Neuro:  Strength and sensation are intact Psych: normal   EKG:  EKG {ACTION; IS/IS BVA:70141030} ordered today. The ekg ordered today demonstrates ***   Recent Labs: 07/04/2015: ALT 34*    Lipid Panel No results found for: CHOL, TRIG, HDL, CHOLHDL, VLDL, LDLCALC, LDLDIRECT    Wt Readings from Last 3 Encounters:  07/07/15 112.038 kg (247 lb)  07/04/15 111.131 kg (245 lb)  06/07/15 111.131 kg (245 lb)      Other studies Reviewed: Additional studies/ records that were reviewed today include: ***. Review of the above records demonstrates: ***   ASSESSMENT AND PLAN:  1.  Hyperlipidemia:    2. Essential hypertension:     Current medicines are reviewed at length with the patient today.  The patient {ACTIONS; HAS/DOES NOT HAVE:19233} concerns regarding medicines.  The following changes have been made:  {PLAN; NO CHANGE:13088:s}  Labs/ tests ordered today include: *** No orders of the defined types were placed in this encounter.     Disposition:   FU with ***     Nahser, Wonda Cheng, MD  07/14/2015 9:04 AM    [facility] [place], [place], [place]  [address] Phone: [phone]; Fax: [phone]   [facility]  [address] [place], [place]  [address] [phone]   Fax [phone]

## 2015-07-14 NOTE — Discharge Instructions (Signed)

## 2015-07-14 NOTE — ED Provider Notes (Signed)
[facility] Emergency Department Provider Note     Time seen: ----------------------------------------- 2:13 PM on 07/14/2015 -----------------------------------------    I have reviewed the triage vital signs and the nursing notes.   HISTORY  Chief Complaint Abdominal Pain and Constipation    HPI Carrie Larsen is a 69 y.o. female who presents ER after having had constipation for the last 6 days. Patient states she has not a bowel movement that period time. She states she had a colonoscopy 2 weeks ago and had excellent results taking MiraLAX. She denies fevers, chills, chest pain, shortness of breath. She denies any change in her medications of any generic version of Naprosyn.   Past Medical History  Diagnosis Date  . Hypertension 2009  . Hyperlipidemia   . GERD (gastroesophageal reflux disease)   . Colon polyp 2011  . Colon cancer 1992    T3, N1, M0.  . Cancer     left kidney, partial    Patient Active Problem List   Diagnosis Date Noted  . Retrosternal chest pain 07/08/2015  . History of colonic polyps 04/16/2015  . Abdominal pain, left upper quadrant 05/11/2014  . Chest pain 03/08/2011  . Hyperlipidemia 03/08/2011  . HTN (hypertension) 03/08/2011    Past Surgical History  Procedure Laterality Date  . Appendectomy    . Abdominal hysterectomy    . Colon resection    . Nephrectomy      left  . Hernia repair    . Tonsillectomy    . Colonoscopy  03-14-10    Dr Bary Castilla, tubular adenoma at 25 cm.  . Colonoscopy with propofol N/A 06/07/2015    Procedure: COLONOSCOPY WITH PROPOFOL;  Surgeon: Robert Bellow, MD;  Location: [facility] ENDOSCOPY;  Service: Endoscopy;  Laterality: N/A;    Allergies Morphine and related  Social History Social History  Substance Use Topics  . Smoking status: Never Smoker   . Smokeless tobacco: Never Used  . Alcohol Use: No    Review of Systems Constitutional: Negative for fever. Eyes: Negative for  visual changes. ENT: Negative for sore throat. Cardiovascular: Negative for chest pain. Respiratory: Negative for shortness of breath. Gastrointestinal: Positive for abdominal pain Genitourinary: Negative for dysuria. Musculoskeletal: Negative for back pain. Skin: Negative for rash. Neurological: Negative for headaches, focal weakness or numbness.  10-point ROS otherwise negative.  ____________________________________________   PHYSICAL EXAM:  VITAL SIGNS: ED Triage Vitals  Enc Vitals Group     BP 07/14/15 1117 164/92 mmHg     Pulse Rate 07/14/15 1117 74     Resp 07/14/15 1117 20     Temp 07/14/15 1117 98.1 F (36.7 C)     Temp Source 07/14/15 1117 Oral     SpO2 07/14/15 1117 98 %     Weight 07/14/15 1117 244 lb (110.678 kg)     Height 07/14/15 1117 5\' 10"  (1.778 m)     Head Cir --      Peak Flow --      Pain Score 07/14/15 1118 8     Pain Loc --      Pain Edu? --      Excl. in [place]? --     Constitutional: Alert and oriented. Well appearing and in no distress. Eyes: Conjunctivae are normal. PERRL. Normal extraocular movements. ENT   Head: Normocephalic and atraumatic.   Nose: No congestion/rhinnorhea.   Mouth/Throat: Mucous membranes are moist.   Neck: No stridor. Cardiovascular: Normal rate, regular rhythm. Normal and symmetric distal pulses are  present in all extremities. No murmurs, rubs, or gallops. Respiratory: Normal respiratory effort without tachypnea nor retractions. Breath sounds are clear and equal bilaterally. No wheezes/rales/rhonchi. Gastrointestinal: Soft and nontender. Hypoactive bowel sounds. Musculoskeletal: Nontender with normal range of motion in all extremities. No joint effusions.  No lower extremity tenderness nor edema. Neurologic:  Normal speech and language. No gross focal neurologic deficits are appreciated. Speech is normal. No gait instability. Skin:  Skin is warm, dry and intact. No rash noted. Psychiatric: Mood and affect are  normal. Speech and behavior are normal. Patient exhibits appropriate insight and judgment.  ____________________________________________  ED COURSE:  Pertinent labs & imaging results that were available during my care of the patient were reviewed by me and considered in my medical decision making (see chart for details). Patient likely constipated, will confirm with labs and x-ray ____________________________________________    LABS (pertinent positives/negatives)  Labs Reviewed  COMPREHENSIVE METABOLIC PANEL - Abnormal; Notable for the following:    Glucose, Bld 112 (*)    All other components within normal limits  URINALYSIS COMPLETEWITH MICROSCOPIC ([facility] ONLY) - Abnormal; Notable for the following:    Color, Urine AMBER (*)    APPearance CLEAR (*)    Squamous Epithelial / LPF 0-5 (*)    All other components within normal limits  LIPASE, BLOOD  CBC    RADIOLOGY   IMPRESSION: 1. Unremarkable bowel gas pattern. 2. Lumbar spondylosis.  ____________________________________________  FINAL ASSESSMENT AND PLAN  Constipation  Plan: Patient with labs and imaging as dictated above. I advised the patient she needs bowel prep again. She'll be placed back on MiraLAX and is instructed to take 2 bottles of that. She can follow-up with her doctor for reevaluation. Labs and x-ray are otherwise unremarkable here. She has a nonsurgical abdomen.   Earleen Newport, MD   Earleen Newport, MD 07/14/15 [phone]

## 2015-07-14 NOTE — Telephone Encounter (Signed)
error 

## 2015-07-14 NOTE — ED Notes (Signed)
Patient to ED with report of colonoscopy 2 weeks ago, since that time bowels have not been normal and no BM in 6 days, reports last night took laxative and since that time has had severe cramping and pain.

## 2015-07-24 ENCOUNTER — Emergency Department: Payer: Medicare Other

## 2015-07-24 ENCOUNTER — Emergency Department
Admission: EM | Admit: 2015-07-24 | Discharge: 2015-07-24 | Disposition: A | Discharge status: Home / Self Care | Payer: Medicare Other | Attending: Emergency Medicine | Admitting: Emergency Medicine

## 2015-07-24 DIAGNOSIS — S99921A Unspecified injury of right foot, initial encounter: Secondary | ICD-10-CM | POA: Diagnosis present

## 2015-07-24 DIAGNOSIS — S93601A Unspecified sprain of right foot, initial encounter: Secondary | ICD-10-CM | POA: Diagnosis not present

## 2015-07-24 DIAGNOSIS — Y998 Other external cause status: Secondary | ICD-10-CM | POA: Insufficient documentation

## 2015-07-24 DIAGNOSIS — Z791 Long term (current) use of non-steroidal anti-inflammatories (NSAID): Secondary | ICD-10-CM | POA: Insufficient documentation

## 2015-07-24 DIAGNOSIS — Y9389 Activity, other specified: Secondary | ICD-10-CM | POA: Diagnosis not present

## 2015-07-24 DIAGNOSIS — Y9289 Other specified places as the place of occurrence of the external cause: Secondary | ICD-10-CM | POA: Diagnosis not present

## 2015-07-24 DIAGNOSIS — Z79899 Other long term (current) drug therapy: Secondary | ICD-10-CM | POA: Insufficient documentation

## 2015-07-24 DIAGNOSIS — X58XXXA Exposure to other specified factors, initial encounter: Secondary | ICD-10-CM | POA: Diagnosis not present

## 2015-07-24 DIAGNOSIS — I10 Essential (primary) hypertension: Secondary | ICD-10-CM | POA: Diagnosis not present

## 2015-07-24 NOTE — ED Notes (Signed)
Patient transported to X-ray 

## 2015-07-24 NOTE — ED Notes (Signed)
Pt c/o right foot pain since last night; was walking across concrete paving blocks and mis-stepped; felt a "crack"; ambulatory with increased pain

## 2015-07-24 NOTE — ED Notes (Signed)
Patient returned from X-ray 

## 2015-07-24 NOTE — ED Notes (Signed)
NAD noted at time of D/C. Pt taken to lobby via wheelchair by EDT. Pt refused surgical boot suggested by MD.

## 2015-07-24 NOTE — Discharge Instructions (Signed)
Foot Sprain The muscles and cord like structures which attach muscle to bone (tendons) that surround the feet are made up of units. A foot sprain can occur at the weakest spot in any of these units. This condition is most often caused by injury to or overuse of the foot, as from playing contact sports, or aggravating a previous injury, or from poor conditioning, or obesity. SYMPTOMS  Pain with movement of the foot.  Tenderness and swelling at the injury site.  Loss of strength is present in moderate or severe sprains. THE THREE GRADES OR SEVERITY OF FOOT SPRAIN ARE:  Mild (Grade I): Slightly pulled muscle without tearing of muscle or tendon fibers or loss of strength.  Moderate (Grade II): Tearing of fibers in a muscle, tendon, or at the attachment to bone, with small decrease in strength.  Severe (Grade III): Rupture of the muscle-tendon-bone attachment, with separation of fibers. Severe sprain requires surgical repair. Often repeating (chronic) sprains are caused by overuse. Sudden (acute) sprains are caused by direct injury or over-use. DIAGNOSIS  Diagnosis of this condition is usually by your own observation. If problems continue, a caregiver may be required for further evaluation and treatment. X-rays may be required to make sure there are not breaks in the bones (fractures) present. Continued problems may require physical therapy for treatment. PREVENTION  Use strength and conditioning exercises appropriate for your sport.  Warm up properly prior to working out.  Use athletic shoes that are made for the sport you are participating in.  Allow adequate time for healing. Early return to activities makes repeat injury more likely, and can lead to an unstable arthritic foot that can result in prolonged disability. Mild sprains generally heal in 3 to 10 days, with moderate and severe sprains taking 2 to 10 weeks. Your caregiver can help you determine the proper time required for  healing. HOME CARE INSTRUCTIONS   Apply ice to the injury for 15-20 minutes, 03-04 times per day. Put the ice in a plastic bag and place a towel between the bag of ice and your skin.  An elastic wrap (like an Ace bandage) may be used to keep swelling down.  Keep foot above the level of the heart, or at least raised on a footstool, when swelling and pain are present.  Try to avoid use other than gentle range of motion while the foot is painful. Do not resume use until instructed by your caregiver. Then begin use gradually, not increasing use to the point of pain. If pain does develop, decrease use and continue the above measures, gradually increasing activities that do not cause discomfort, until you gradually achieve normal use.  Use crutches if and as instructed, and for the length of time instructed.  Keep injured foot and ankle wrapped between treatments.  Massage foot and ankle for comfort and to keep swelling down. Massage from the toes up towards the knee.  Only take over-the-counter or prescription medicines for pain, discomfort, or fever as directed by your caregiver. SEEK IMMEDIATE MEDICAL CARE IF:   Your pain and swelling increase, or pain is not controlled with medications.  You have loss of feeling in your foot or your foot turns cold or blue.  You develop new, unexplained symptoms, or an increase of the symptoms that brought you to your caregiver. MAKE SURE YOU:   Understand these instructions.  Will watch your condition.  Will get help right away if you are not doing well or get worse. Document Released:  05/04/2002 Document Revised: 02/04/2012 Document Reviewed: 07/01/2008 ExitCare Patient Information 2015 [place], [place]. This information is not intended to replace advice given to you by your health care provider. Make sure you discuss any questions you have with your health care provider.  Please return immediately if condition worsens. Please contact her primary  physician or the physician you were given for referral. If you have any specialist physicians involved in her treatment and plan please also contact them. Thank you for using [facility] emergency [facility].

## 2015-07-24 NOTE — ED Provider Notes (Signed)
Time Seen: Approximately *----------------------------------------- 7:18 AM on 07/24/2015 -----------------------------------------    I have reviewed the triage notes  Chief Complaint: Foot Pain   History of Present Illness: Carrie Larsen is a 69 y.o. female presents after injuring her right foot patient states she was ambulating and for heard a crack with a twisting motion to her right foot. She's had some difficulty in bearing weight. She denies any other injuries to the knee or the hip etc.   Past Medical History  Diagnosis Date  . Hypertension 2009  . Hyperlipidemia   . GERD (gastroesophageal reflux disease)   . Colon polyp 2011  . Colon cancer 1992    T3, N1, M0.  . Cancer     left kidney, partial    Patient Active Problem List   Diagnosis Date Noted  . Retrosternal chest pain 07/08/2015  . History of colonic polyps 04/16/2015  . Abdominal pain, left upper quadrant 05/11/2014  . Chest pain 03/08/2011  . Hyperlipidemia 03/08/2011  . HTN (hypertension) 03/08/2011    Past Surgical History  Procedure Laterality Date  . Appendectomy    . Abdominal hysterectomy    . Colon resection    . Nephrectomy      left  . Hernia repair    . Tonsillectomy    . Colonoscopy  03-14-10    Dr Carrie Larsen, tubular adenoma at 25 cm.  . Colonoscopy with propofol N/A 06/07/2015    Procedure: COLONOSCOPY WITH PROPOFOL;  Surgeon: Carrie Bellow, MD;  Location: [facility] ENDOSCOPY;  Service: Endoscopy;  Laterality: N/A;    Past Surgical History  Procedure Laterality Date  . Appendectomy    . Abdominal hysterectomy    . Colon resection    . Nephrectomy      left  . Hernia repair    . Tonsillectomy    . Colonoscopy  03-14-10    Dr Carrie Larsen, tubular adenoma at 25 cm.  . Colonoscopy with propofol N/A 06/07/2015    Procedure: COLONOSCOPY WITH PROPOFOL;  Surgeon: Carrie Bellow, MD;  Location: [facility] ENDOSCOPY;  Service: Endoscopy;  Laterality: N/A;    Current Outpatient Rx  Name   Route  Sig  Dispense  Refill  . esomeprazole (NEXIUM) 40 MG capsule   Oral   Take 40 mg by mouth daily before breakfast.           . metoprolol (LOPRESSOR) 50 MG tablet   Oral   Take 50 mg by mouth 2 (two) times daily.           . Multiple Vitamins-Minerals (MULTIVITAMIN WITH MINERALS) tablet   Oral   Take 1 tablet by mouth daily.         . naproxen (NAPROSYN) 500 MG tablet   Oral   Take 500 mg by mouth daily.           Allergies:  Morphine and related  Family History: Family History  Problem Relation Age of Onset  . Cerebral aneurysm Father   . Colon cancer Mother     Social History: Social History  Substance Use Topics  . Smoking status: Never Smoker   . Smokeless tobacco: Never Used  . Alcohol Use: No     Review of Systems:   Cardiac: No chest pain Respiratory: No shortness of breath, wheezing, or stridor Extremities: No peripheral edema, cyanosis Skin: No rashes, easy bruising Neurologic: No focal weakness, trouble with speech or swollowing  Physical Exam:  ED Triage Vitals  Enc Vitals Group  BP 07/24/15 0557 138/68 mmHg     Pulse Rate 07/24/15 0557 63     Resp 07/24/15 0557 18     Temp 07/24/15 0557 97.9 F (36.6 C)     Temp Source 07/24/15 0557 Oral     SpO2 07/24/15 0557 97 %     Weight 07/24/15 0557 245 lb (111.131 kg)     Height 07/24/15 0557 5\' 9"  (1.753 m)     Head Cir --      Peak Flow --      Pain Score 07/24/15 0551 6     Pain Loc --      Pain Edu? --      Excl. in [place]? --     General: Awake , Alert , and Oriented times 3; GCS 15  Lungs: Clear to ascultation without wheezes , rhonchi, or rales Heart: Regular rate, regular rhythm without murmurs , gallops , or rubs        Extremities: 2 plus symmetric pulses. No edema, clubbing or cyanosis. Close examination of the right foot does not show any reproducible injury with palpation especially over the area concern which is lateral surface of the foot. There is no crepitus or  step-off noted. Extremities neurovascularly intact with no obvious edema. Neurologic: normal ambulation, Motor symmetric without deficits, sensory intact Skin: warm, dry, no rashes         ED Course: Patient had an x-ray right foot which overall was negative.   Carrie Larsen, Boss #[id number] (69 y.o. F) ED19A        Lab Results    None    Imaging Results       DG Foot Complete Right (Final result) Result time: 07/24/15 06:30:01   Final result by Rad Results In Interface (07/24/15 06:30:01)   Narrative:   CLINICAL DATA: Right foot pain after injury. Twisting injury 1 day prior.  EXAM: RIGHT FOOT COMPLETE - 3+ VIEW  COMPARISON: None.  FINDINGS: No fracture or dislocation. The alignment is maintained. Medial soft tissue prominence at the first metatarsal phalangeal joint with subcortical cystic change involving the medial first metatarsal head. 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 Mild soft tissue edema. Small plantar calcaneal spur.  IMPRESSION: 1. No acute fracture or dislocation of the right foot. 2. Medial soft tissue prominence about the first metatarsal phalangeal joint with subchondral cystic change. This may reflect bunion, however gout could have a similar appearance.   Electronically Signed By: Carrie Larsen M.D. On: 07/24/2015 06:30        ECG Results    None        Assessment: Right foot sprain Final Clinical Impression: Right foot sprain  Final diagnoses:  Sprain of foot, right, initial encounter     Plan:Patient was offered a postoperative shoe but declined she states that she probably "" would not use it "". I advised the nursing can always give it to return if she changes her mind she can wear at home. He was advised to rest ice and elevate the foot she does have a podiatrist that she is associated with and she was advised to give them a call on Monday. Over-the-counter pain medication and return if condition  worsens.           Carrie Larsen, MD 07/24/15 [phone]

## 2015-08-08 ENCOUNTER — Ambulatory Visit (INDEPENDENT_AMBULATORY_CARE_PROVIDER_SITE_OTHER): Payer: Medicare Other | Admitting: Nurse Practitioner

## 2015-08-08 ENCOUNTER — Encounter: Payer: Self-pay | Admitting: Nurse Practitioner

## 2015-08-08 VITALS — BP 152/96 | HR 67 | Ht 70.0 in | Wt 253.0 lb

## 2015-08-08 DIAGNOSIS — R011 Cardiac murmur, unspecified: Secondary | ICD-10-CM | POA: Diagnosis not present

## 2015-08-08 DIAGNOSIS — K30 Functional dyspepsia: Secondary | ICD-10-CM

## 2015-08-08 DIAGNOSIS — I10 Essential (primary) hypertension: Secondary | ICD-10-CM

## 2015-08-08 DIAGNOSIS — R1013 Epigastric pain: Secondary | ICD-10-CM | POA: Diagnosis not present

## 2015-08-08 DIAGNOSIS — R079 Chest pain, unspecified: Secondary | ICD-10-CM

## 2015-08-08 NOTE — Progress Notes (Signed)
Patient Name: Carrie Larsen Date of Encounter: 08/08/2015  Primary Care Provider:  Lavonne Chick, MD Primary Cardiologist:  Johnny Bridge, MD   Chief Complaint  69 y/o female with a h/o chest pain and HTN, who presents today related to intermittent indigestion and recent finding of a murmur.  Past Medical History   Past Medical History  Diagnosis Date  . Essential hypertension   . Hyperlipidemia   . GERD (gastroesophageal reflux disease)   . Colon polyp 2011  . Colon cancer 1992    T3, N1, M0.  . Cancer     a. s/p partial left nephrectomy.  . Chest pain     a. 2012 ETT: no ischemia.  . Systolic murmur    Past Surgical History  Procedure Laterality Date  . Appendectomy    . Abdominal hysterectomy    . Colon resection    . Nephrectomy      left  . Hernia repair    . Tonsillectomy    . Colonoscopy  03-14-10    Dr Bary Castilla, tubular adenoma at 25 cm.  . Colonoscopy with propofol N/A 06/07/2015    Procedure: COLONOSCOPY WITH PROPOFOL;  Surgeon: Robert Bellow, MD;  Location: [facility] ENDOSCOPY;  Service: Endoscopy;  Laterality: N/A;    Allergies  Allergies  Allergen Reactions  . Morphine And Related Nausea Only    HPI  69 y/o female with the above complex problem list.  She has a h/o colon and renal CA along with a h/o HTN, obesity, and chest pain with negative ETT in 2012.  She was recently seen by general surgery for cancer f/u and reported intermittent chest pressure.  A systolic murmur was also heard on exam.  As a result, she was referred for evaluation but missed her appt in mid Aug and rescheduled for today.  She notes that if she eats too close to bedtime, it is common for her to awake during the night with retrosternal burning discomfort that is relieved with Tums.  She has not been having this discomfort during the day.  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  She denies palpitations, pnd, orthopnea, n, v, dizziness, syncope, edema, weight gain, or early satiety.   Home Medications  Prior to Admission medications   Medication Sig Start Date End Date Taking? Authorizing Provider  esomeprazole (NEXIUM) 40 MG capsule Take 40 mg by mouth daily before breakfast.     Yes Historical Provider, MD  metoprolol (LOPRESSOR) 50 MG tablet Take 50 mg by mouth 2 (two) times daily.     Yes Historical Provider, MD  Multiple Vitamins-Minerals (MULTIVITAMIN WITH MINERALS) tablet Take 1 tablet by mouth daily.   Yes Historical Provider, MD  naproxen (NAPROSYN) 500 MG tablet Take 500 mg by mouth daily.   Yes Historical Provider, MD  nortriptyline (PAMELOR) 10 MG capsule Take 10 mg by mouth daily.  05/05/15  Yes Historical Provider, MD    Review of Systems  Occasional indigestion - especially after eating high fat/greasy foods before bed (hot dogs).  All other systems reviewed and are otherwise negative except as noted above.  Physical Exam  VS:  BP 152/96 mmHg  Pulse 67  Ht 5\' 10"  (1.778 m)  Wt 253 lb (114.76 kg)  BMI 36.30 kg/m2 , BMI Body mass index is  36.3 kg/(m^2). GEN: Well nourished, well developed, in no acute distress. HEENT: normal. Neck: Supple, no JVD, carotid bruits, or masses. Cardiac: RRR, 2/6 blowing quality systolic murmur @ bilat USBs.  No rubs, or gallops. No clubbing, cyanosis, edema.  Radials/DP/PT 2+ and equal bilaterally.  Respiratory:  Respirations regular and unlabored, clear to auscultation bilaterally. GI: Soft, nontender, nondistended, BS + x 4. MS: no deformity or atrophy. Skin: warm and dry, no rash. Neuro:  Strength and sensation are intact. Psych: Normal affect.  Accessory Clinical Findings  ECG - RSR, 67, poor R progression.  Assessment & Plan  1.  Systolic Murmur:  Pt noted to have new systolic murmur @ the upper sternal border.  She is asymptomatic.  I will obtain a 2D echo  to evaluate valvular anatomy.  2.  Indigestion:  Pt reports a h/o intermittent chest and epigastric discomfort that occurs almost exclusively during the night after she had a fatty/greasy meal just prior to bedtime.  Ss are controlled with nexium and prn Tums.  She does not experience exertional c/p or dyspnea.  She has prev had a nl ETT in 2012.  I advised that she cont PPI and we discussed the importance of not eating for several hours to bedtime whenever possible.  Further, she should alter her diet, which is currently high calorie/high salt.  She understands that if she were to develop exertional c/p or dyspnea, that she is to contact us b/c @ that point, we should consider undertaking a stress test.  3.  Essential HTN:  BP elevated today.  She says that she's typically around 140 @ home. She is on lopressor 50 bid and has been for some time.  As above, we talked about her diet and need to avoid added salt/processed foods.  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  I've asked her to track her BP @ home for the next week and call us if #'s are consistently > 140 mmHg.  At that point, I'd add chlorthalidone.  4.  Morbid Obesity:  Discussed the need for wt loss as above.  5.  Dispo:  F/u echo. F/u with Dr. Rockey Situ as needed.    Murray Hodgkins, NP 08/08/2015, 2:50 PM

## 2015-08-08 NOTE — Patient Instructions (Signed)
Medication Instructions:  Your physician recommends that you continue on your current medications as directed. Please refer to the Current Medication list given to you today.   Labwork: none  Testing/Procedures: Your physician has requested that you have an echocardiogram. Echocardiography is a painless test that uses sound waves to create images of your heart. It provides your doctor with information about the size and shape of your heart and how well your heart's chambers and valves are working. This procedure takes approximately one hour. There are no restrictions for this procedure.    Follow-Up: Your physician recommends that you schedule a follow-up appointment with Dr. Rockey Situ as needed.   Any Other Special Instructions Will Be Listed Below (If Applicable).  Echocardiogram An echocardiogram, or echocardiography, uses sound waves (ultrasound) to produce an image of your heart. The echocardiogram is simple, painless, obtained within a short period of time, and offers valuable information to your health care provider. The images from an echocardiogram can provide information such as:  Evidence of coronary artery disease (CAD).  Heart size.  Heart muscle function.  Heart valve function.  Aneurysm detection.  Evidence of a past heart attack.  Fluid buildup around the heart.  Heart muscle thickening.  Assess heart valve function. LET [facility] CARE PROVIDER KNOW ABOUT:  Any allergies you have.  All medicines you are taking, including vitamins, herbs, eye drops, creams, and over-the-counter medicines.  Previous problems you or members of your family have had with the use of anesthetics.  Any blood disorders you have.  Previous surgeries you have had.  Medical conditions you have.  Possibility of pregnancy, if this applies. BEFORE THE PROCEDURE  No special preparation is needed. Eat and drink normally.  PROCEDURE   In order to produce an image of your heart,  gel will be applied to your chest and a wand-like tool (transducer) will be moved over your chest. The gel will help transmit the sound waves from the transducer. The sound waves will harmlessly bounce off your heart to allow the heart images to be captured in real-time motion. These images will then be recorded.  You may need an IV to receive a medicine that improves the quality of the pictures. AFTER THE PROCEDURE You may return to your normal schedule including diet, activities, and medicines, unless your health care provider tells you otherwise. Document Released: 11/09/2000 Document Revised: 03/29/2014 Document Reviewed: 07/20/2013 [facility] Patient Information 2015 [place], [place]. This information is not intended to replace advice given to you by your health care provider. Make sure you discuss any questions you have with your health care provider.

## 2015-08-22 ENCOUNTER — Ambulatory Visit (INDEPENDENT_AMBULATORY_CARE_PROVIDER_SITE_OTHER): Payer: Medicare Other | Admitting: General Surgery

## 2015-08-22 ENCOUNTER — Other Ambulatory Visit: Payer: Medicare Other

## 2015-08-22 VITALS — HR 80 | Resp 14 | Ht 67.0 in | Wt 251.0 lb

## 2015-08-22 DIAGNOSIS — M7989 Other specified soft tissue disorders: Secondary | ICD-10-CM

## 2015-08-22 NOTE — Progress Notes (Signed)
Patient ID: Carrie Larsen, female   DOB: 17-Dec-1945, 69 y.o.   MRN: [id number]  Chief Complaint  Patient presents with  . Other    leg swelling     HPI Carrie Larsen is a 69 y.o. female here today for a evaluation of left ankle swelling and redness all the way around the ankle. Patient was seen in the ER on 08/18/15, they did not perform a ultrasound at that time. She states she noticed the swelling about two weeks ago. She states they gave her Hydrochlorothiazide, she states that has helped a lot.    HPI / Past Medical History  Diagnosis Date  . Essential hypertension   . Hyperlipidemia   . GERD (gastroesophageal reflux disease)   . Colon polyp 2011  . Colon cancer 1992    T3, N1, M0.  . Cancer     a. s/p partial left nephrectomy.  . Chest pain     a. 2012 ETT: no ischemia.  . Systolic murmur     Past Surgical History  Procedure Laterality Date  . Appendectomy    . Abdominal hysterectomy    . Colon resection    . Nephrectomy      left  . Hernia repair    . Tonsillectomy    . Colonoscopy  03-14-10    Dr Bary Castilla, tubular adenoma at 25 cm.  . Colonoscopy with propofol N/A 06/07/2015    Procedure: COLONOSCOPY WITH PROPOFOL;  Surgeon: Robert Bellow, MD;  Location: [facility] ENDOSCOPY;  Service: Endoscopy;  Laterality: N/A;    Family History  Problem Relation Age of Onset  . Cerebral aneurysm Father   . Colon cancer Mother     Social History Social History  Substance Use Topics  . Smoking status: Never Smoker   . Smokeless tobacco: Never Used  . Alcohol Use: No    Allergies  Allergen Reactions  . Morphine And Related Nausea Only    Current Outpatient Prescriptions  Medication Sig Dispense Refill  . esomeprazole (NEXIUM) 40 MG capsule Take 40 mg by mouth daily before breakfast.      . hydrochlorothiazide (HYDRODIURIL) 25 MG tablet Take 25 mg by mouth daily.     . metoprolol (LOPRESSOR) 50 MG tablet Take 50 mg by mouth 2 (two) times daily.      . Multiple  Vitamins-Minerals (MULTIVITAMIN WITH MINERALS) tablet Take 1 tablet by mouth daily.    . naproxen (NAPROSYN) 500 MG tablet Take 500 mg by mouth daily.    . nortriptyline (PAMELOR) 10 MG capsule Take 10 mg by mouth daily.      No current facility-administered medications for this visit.    Review of Systems Review of Systems  Constitutional: Negative.   Respiratory: Negative.   Cardiovascular: Positive for leg swelling. Negative for chest pain and palpitations.    Pulse 80, resp. rate 14, height 5\' 7"  (1.702 m), weight 251 lb (113.853 kg).  Physical Exam Physical Exam  Constitutional: She is oriented to person, place, and time. She appears well-developed and well-nourished.  Cardiovascular: Normal rate, regular rhythm and normal heart sounds.   Pulses:      Femoral pulses are 1+ on the right side, and 1+ on the left side.      Popliteal pulses are 2+ on the right side, and 2+ on the left side.       Dorsalis pedis pulses are 0 on the right side, and 0 on the left side.  Posterior tibial pulses are 1+ on the right side, and 1+ on the left side.  Scant pitting edema bilaterally. No visual asymmetry.  Pulmonary/Chest: Effort normal and breath sounds normal.  Abdominal: Soft. Normal appearance.  Musculoskeletal:       Legs: Neurological: She is alert and oriented to person, place, and time.  Skin: Skin is warm and dry.    Data Reviewed Emergency department [facility] from [facility]. Normal d-dimer reported.  Assessment    Transient left leg swelling and redness, now resolved.  Mild asymmetry and lower extremity measurements.    Plan    The patient is decade status post her colon cancer, and the abrupt appearance and resolution of her symptoms would speak against DVT. The burning pain extending from the ankle up the anterior medial aspect of the left calf suggested the possibility of superficial phlebitis although there was no visible erythema or tenderness on  exam.  It was elected to complete a venous ultrasound.  Examination of the common femoral, superficial femoral (proximal and distal) and popliteal veins showed normal compressibility and augmentation with distal compression. The saphenous vein was normal both at the groin and above the level of the ankle. Impression no evidence of deep venous thrombosis of the left lower extremity.  The patient may discontinue the oral diuretic.     Patient to return as needed.  PCP:  Kyra Leyland, Forest Gleason 08/22/2015, 7:50 PM

## 2015-08-22 NOTE — Patient Instructions (Signed)
Patient to return as needed. 

## 2015-08-25 ENCOUNTER — Ambulatory Visit: Payer: Medicare Other | Admitting: Cardiovascular Disease

## 2015-08-25 ENCOUNTER — Other Ambulatory Visit: Payer: Medicare Other

## 2015-09-14 ENCOUNTER — Other Ambulatory Visit: Payer: Self-pay | Admitting: Nurse Practitioner

## 2015-09-14 DIAGNOSIS — R079 Chest pain, unspecified: Secondary | ICD-10-CM

## 2015-09-14 DIAGNOSIS — R011 Cardiac murmur, unspecified: Secondary | ICD-10-CM

## 2015-09-15 ENCOUNTER — Other Ambulatory Visit: Payer: Self-pay

## 2015-09-15 ENCOUNTER — Ambulatory Visit (INDEPENDENT_AMBULATORY_CARE_PROVIDER_SITE_OTHER): Payer: Medicare Other

## 2015-09-15 DIAGNOSIS — R011 Cardiac murmur, unspecified: Secondary | ICD-10-CM

## 2015-09-22 ENCOUNTER — Ambulatory Visit: Payer: Medicare Other | Admitting: Cardiovascular Disease

## 2015-10-14 ENCOUNTER — Telehealth: Payer: Self-pay | Admitting: Cardiovascular Disease

## 2015-10-14 NOTE — Telephone Encounter (Signed)
Per last visit avs fu as needed .  Deleted old recall.

## 2015-11-16 ENCOUNTER — Ambulatory Visit (INDEPENDENT_AMBULATORY_CARE_PROVIDER_SITE_OTHER): Payer: Medicare Other | Admitting: Podiatry

## 2015-11-16 ENCOUNTER — Encounter: Payer: Self-pay | Admitting: Podiatry

## 2015-11-16 VITALS — BP 171/84 | HR 67 | Resp 16

## 2015-11-16 DIAGNOSIS — Q828 Other specified congenital malformations of skin: Secondary | ICD-10-CM

## 2015-11-16 DIAGNOSIS — M79676 Pain in unspecified toe(s): Secondary | ICD-10-CM | POA: Diagnosis not present

## 2015-11-16 DIAGNOSIS — B351 Tinea unguium: Secondary | ICD-10-CM

## 2015-11-16 NOTE — Progress Notes (Signed)
She presents today with chief complaint of painful elongated toenails as well as porokeratosis to the plantar aspect of the bilateral foot. She states that these inhibit her from performing her daily activities.  Objective: Vital signs are stable she is alert and oriented 3. Pulses are palpable bilateral. She has limitation on range of motion of first metatarsophalangeal joints which result in superficial ulcerations to the IP joints and reactive hyperkeratoses. Multiple porokeratotic lesions are noted throughout the foot and the plantar aspect. She has no open lesions at this point. No signs of infection. Her toenails are thick yellow dystrophic onychomycotic painful palpation as well as debridement.  Assessment: Pain and limp secondary to onychomycosis bilateral. 4 keratoses and reactive hyperkeratosis bilateral.  Plan: Debridement of all reactive hyperkeratotic lesions discussed the need for surgical intervention regarding the first metatarsophalangeal joints and also debrided nails 1 through 5 bilateral.

## 2016-01-09 ENCOUNTER — Emergency Department
Admission: EM | Admit: 2016-01-09 | Discharge: 2016-01-09 | Disposition: A | Discharge status: Home / Self Care | Payer: Medicare Other | Attending: Emergency Medicine | Admitting: Emergency Medicine

## 2016-01-09 ENCOUNTER — Emergency Department: Payer: Medicare Other

## 2016-01-09 ENCOUNTER — Encounter: Payer: Self-pay | Admitting: Emergency Medicine

## 2016-01-09 DIAGNOSIS — R51 Headache: Secondary | ICD-10-CM | POA: Insufficient documentation

## 2016-01-09 DIAGNOSIS — Z79899 Other long term (current) drug therapy: Secondary | ICD-10-CM | POA: Insufficient documentation

## 2016-01-09 DIAGNOSIS — Z791 Long term (current) use of non-steroidal anti-inflammatories (NSAID): Secondary | ICD-10-CM | POA: Insufficient documentation

## 2016-01-09 DIAGNOSIS — I10 Essential (primary) hypertension: Secondary | ICD-10-CM | POA: Insufficient documentation

## 2016-01-09 DIAGNOSIS — R519 Headache, unspecified: Secondary | ICD-10-CM

## 2016-01-09 LAB — COMPREHENSIVE METABOLIC PANEL
ALT: 38 U/L (ref 14–54)
AST: 28 U/L (ref 15–41)
Albumin: 4.1 g/dL (ref 3.5–5.0)
Alkaline Phosphatase: 118 U/L (ref 38–126)
Anion gap: 7 (ref 5–15)
BUN: 18 mg/dL (ref 6–20)
CO2: 26 mmol/L (ref 22–32)
Calcium: 9.5 mg/dL (ref 8.9–10.3)
Chloride: 108 mmol/L (ref 101–111)
Creatinine, Ser: 0.71 mg/dL (ref 0.44–1.00)
GFR calc Af Amer: >60 mL/min (ref >60)
GFR calc non Af Amer: >60 mL/min (ref >60)
Glucose, Bld: 106 mg/dL — ABNORMAL HIGH (ref 65–99)
Potassium: 4.3 mmol/L (ref 3.5–5.1)
Sodium: 141 mmol/L (ref 135–145)
Total Bilirubin: 0.6 mg/dL (ref 0.3–1.2)
Total Protein: 6.8 g/dL (ref 6.5–8.1)

## 2016-01-09 LAB — URINALYSIS COMPLETE WITH MICROSCOPIC (ARMC ONLY)
Bilirubin Urine: NEGATIVE
Glucose, UA: NEGATIVE mg/dL
Hgb urine dipstick: NEGATIVE
Ketones, ur: NEGATIVE mg/dL
Nitrite: NEGATIVE
Protein, ur: NEGATIVE mg/dL
Specific Gravity, Urine: 1.029 (ref 1.005–1.030)
pH: 5 (ref 5.0–8.0)

## 2016-01-09 LAB — CBC
HCT: 39.9 % (ref 35.0–47.0)
Hemoglobin: 13.4 g/dL (ref 12.0–16.0)
MCH: 29.1 pg (ref 26.0–34.0)
MCHC: 33.6 g/dL (ref 32.0–36.0)
MCV: 86.6 fL (ref 80.0–100.0)
Platelets: 287 10*3/uL (ref 150–440)
RBC: 4.61 MIL/uL (ref 3.80–5.20)
RDW: 14 % (ref 11.5–14.5)
WBC: 7.1 10*3/uL (ref 3.6–11.0)

## 2016-01-09 MED ORDER — DIPHENHYDRAMINE HCL 50 MG/ML IJ SOLN
25.0000 mg | Freq: Once | INTRAMUSCULAR | Status: AC
  Administered 2016-01-09: 25 mg via INTRAVENOUS
  Filled 2016-01-09: qty 1

## 2016-01-09 MED ORDER — METOCLOPRAMIDE HCL 5 MG/ML IJ SOLN
10.0000 mg | Freq: Once | INTRAMUSCULAR | Status: AC
  Administered 2016-01-09: 10 mg via INTRAVENOUS
  Filled 2016-01-09: qty 2

## 2016-01-09 MED ORDER — BUTALBITAL-APAP-CAFFEINE 50-325-40 MG PO CAPS: 1.0000 | ORAL_CAPSULE | ORAL | Status: DC | PRN

## 2016-01-09 MED ORDER — SODIUM CHLORIDE 0.9 % IV BOLUS (SEPSIS)
1000.0000 mL | Freq: Once | INTRAVENOUS | Status: AC
  Administered 2016-01-09: 1000 mL via INTRAVENOUS

## 2016-01-09 MED ORDER — KETOROLAC TROMETHAMINE 30 MG/ML IJ SOLN
30.0000 mg | Freq: Once | INTRAMUSCULAR | Status: AC
  Administered 2016-01-09: 30 mg via INTRAVENOUS
  Filled 2016-01-09: qty 1

## 2016-01-09 NOTE — ED Provider Notes (Signed)
[facility] Emergency Department Provider Note  Time seen: 6:11 PM  I have reviewed the triage vital signs and the nursing notes.   HISTORY  Chief Complaint Flank Pain    HPI Carrie Larsen is a 70 y.o. female with a past medical history of essential hypertension, hyperlipidemia, gastric reflux, presents the emergency department with a headache. According to the patient for the past 6-7 days she has had a headache. She describes the headache as mild at first but progressively worsened over the first one to days. Now states the headache is severe, located mostly on the top of her head. Denies fever, does state occasional nausea but denies vomiting. Denies focal weakness or numbness. Patient saw her primary care physician who prescribed her Norco. Patient states the headache does resolve with Norco but comes back 1-2 hours later. Patient denies any neck pain. Denies any recent injuries. Patient states some back pain, denies any abdominal pain, contrary to triage note. Denies any chest pain.    Past Medical History  Diagnosis Date  . Essential hypertension   . Hyperlipidemia   . GERD (gastroesophageal reflux disease)   . Colon polyp 2011  . Colon cancer ([place]) 1992    T3, N1, M0.  . Cancer [facility])     a. s/p partial left nephrectomy.  . Chest pain     a. 2012 ETT: no ischemia.  . Systolic murmur     Patient Active Problem List   Diagnosis Date Noted  . Left leg swelling 08/22/2015  . Systolic murmur   . Retrosternal chest pain 07/08/2015  . History of colonic polyps 04/16/2015  . Abdominal pain, left upper quadrant 05/11/2014  . Chest pain 03/08/2011  . Hyperlipidemia 03/08/2011  . HTN (hypertension) 03/08/2011    Past Surgical History  Procedure Laterality Date  . Appendectomy    . Abdominal hysterectomy    . Colon resection    . Nephrectomy      left  . Hernia repair    . Tonsillectomy    . Colonoscopy  03-14-10    Dr Bary Castilla, tubular adenoma  at 25 cm.  . Colonoscopy with propofol N/A 06/07/2015    Procedure: COLONOSCOPY WITH PROPOFOL;  Surgeon: Robert Bellow, MD;  Location: [facility] ENDOSCOPY;  Service: Endoscopy;  Laterality: N/A;    Current Outpatient Rx  Name  Route  Sig  Dispense  Refill  . esomeprazole (NEXIUM) 40 MG capsule   Oral   Take 40 mg by mouth daily before breakfast.           . hydrochlorothiazide (HYDRODIURIL) 25 MG tablet   Oral   Take 25 mg by mouth daily.          . metoprolol (LOPRESSOR) 50 MG tablet   Oral   Take 50 mg by mouth 2 (two) times daily.           . Multiple Vitamins-Minerals (MULTIVITAMIN WITH MINERALS) tablet   Oral   Take 1 tablet by mouth daily.         . naproxen (NAPROSYN) 500 MG tablet   Oral   Take 500 mg by mouth daily.         . nortriptyline (PAMELOR) 10 MG capsule   Oral   Take 10 mg by mouth daily.            Allergies Morphine and related  Family History  Problem Relation Age of Onset  . Cerebral aneurysm Father   . Colon cancer  Mother     Social History Social History  Substance Use Topics  . Smoking status: Never Smoker   . Smokeless tobacco: Never Used  . Alcohol Use: No    Review of Systems Constitutional: Negative for fever. Cardiovascular: Negative for chest pain. Respiratory: Negative for shortness of breath. Gastrointestinal: Negative for abdominal pain Neurological: Positive for headache. Denies focal weakness or numbness. 10-point ROS otherwise negative.  ____________________________________________   PHYSICAL EXAM:  VITAL SIGNS: ED Triage Vitals  Enc Vitals Group     BP 01/09/16 1516 164/85 mmHg     Pulse Rate 01/09/16 1516 65     Resp 01/09/16 1516 20     Temp 01/09/16 1516 98.1 F (36.7 C)     Temp Source 01/09/16 1516 Oral     SpO2 01/09/16 1516 95 %     Weight 01/09/16 1516 250 lb (113.399 kg)     Height 01/09/16 1516 5\' 10"  (1.778 m)     Head Cir --      Peak Flow --      Pain Score 01/09/16 1518 10      Pain Loc --      Pain Edu? --      Excl. in [place]? --     Constitutional: Alert and oriented. Well appearing and in no distress. Eyes: Normal exam ENT   Head: Normocephalic and atraumatic.   Mouth/Throat: Mucous membranes are moist. Cardiovascular: Normal rate, regular rhythm. No murmur Respiratory: Normal respiratory effort without tachypnea nor retractions. Breath sounds are clear Gastrointestinal: Soft and nontender. No distention.   Musculoskeletal: Nontender with normal range of motion in all extremities.  Neurologic:  Normal speech and language. No gross focal neurologic deficits. 2 mm PERRL. EOMI. Cranial nerves intact. Equal grip strengths bilaterally. No pronator drift. 5/5 motor in all extremities. Skin:  Skin is warm, dry and intact.  Psychiatric: Mood and affect are normal. Speech and behavior are normal. ____________________________________________    RADIOLOGY  CT shows no acute abnormality   INITIAL IMPRESSION / ASSESSMENT AND PLAN / ED COURSE  Pertinent labs & imaging results that were available during my care of the patient were reviewed by me and considered in my medical decision making (see chart for details).  Patient presents with progressively worsening headache 1 week, headache is fairly constant but does resolve after use of Norco but comes back several hours later. CT head shows no acute abnormalities. Patient's labs are within normal limits. Patient denies any history of migraines or prolonged headaches in the past. We will obtain an IV, IV hydrate, treat with Toradol, Reglan, Benadryl, patient's kidney function is normal on labs today.  Patient states headache is gone at this time. Patient is feeling tired. We will discharge the patient home, a friend will pick her up. Patient is agreeable to plan. We'll discharge with Fioricet with primary care follow-up.  ____________________________________________   FINAL CLINICAL IMPRESSION(S) / ED  DIAGNOSES  Headache   Harvest Dark, MD 01/09/16 2008

## 2016-01-09 NOTE — ED Notes (Signed)
Patient c/o HA since last Monday. Patient states it is a constant pressure. Patient tearful during assessment, "I'm sorry I've just been hurting for a long time". Patient states her PCP prescribed her hydrocodone but that did not give her any relief.

## 2016-01-09 NOTE — ED Notes (Signed)
R flank pain x 1 week, headache for same time. Denies recent head injury or taking blood thinners.

## 2016-01-09 NOTE — Discharge Instructions (Signed)

## 2016-01-09 NOTE — ED Notes (Signed)
States has not had headaches. States is worst headache she has ever had.

## 2016-01-26 ENCOUNTER — Ambulatory Visit: Payer: Medicare Other | Admitting: Podiatry

## 2016-01-30 ENCOUNTER — Ambulatory Visit: Payer: Medicare Other | Admitting: Podiatry

## 2016-02-15 ENCOUNTER — Ambulatory Visit: Payer: Medicare Other | Admitting: Podiatry

## 2016-03-01 ENCOUNTER — Emergency Department: Payer: Medicare Other

## 2016-03-01 ENCOUNTER — Emergency Department
Admission: EM | Admit: 2016-03-01 | Discharge: 2016-03-01 | Disposition: A | Discharge status: Home / Self Care | Payer: Medicare Other | Attending: Emergency Medicine | Admitting: Emergency Medicine

## 2016-03-01 ENCOUNTER — Encounter: Payer: Self-pay | Admitting: Urgent Care

## 2016-03-01 DIAGNOSIS — M65312 Trigger thumb, left thumb: Secondary | ICD-10-CM | POA: Insufficient documentation

## 2016-03-01 DIAGNOSIS — Z791 Long term (current) use of non-steroidal anti-inflammatories (NSAID): Secondary | ICD-10-CM | POA: Insufficient documentation

## 2016-03-01 DIAGNOSIS — Z79899 Other long term (current) drug therapy: Secondary | ICD-10-CM | POA: Insufficient documentation

## 2016-03-01 DIAGNOSIS — M6530 Trigger finger, unspecified finger: Secondary | ICD-10-CM

## 2016-03-01 DIAGNOSIS — M79645 Pain in left finger(s): Secondary | ICD-10-CM | POA: Diagnosis present

## 2016-03-01 DIAGNOSIS — I10 Essential (primary) hypertension: Secondary | ICD-10-CM | POA: Diagnosis not present

## 2016-03-01 NOTE — ED Notes (Signed)

## 2016-03-01 NOTE — ED Notes (Signed)
Patient to the desk advising that she has been sitting in the corner and did not hear her name called. Will proceed with triage at this time.

## 2016-03-01 NOTE — Discharge Instructions (Signed)
Trigger Finger °Trigger finger (digital tendinitis and stenosing tenosynovitis) is a common disorder that causes an often painful catching of the fingers or thumb. It occurs as a clicking, snapping, or locking of a finger in the palm of the hand. This is caused by a problem with the tendons that flex or bend the fingers sliding smoothly through their sheaths. The condition may occur in any finger or a couple fingers at the same time.  °The finger may lock with the finger curled or suddenly straighten out with a snap. This is more common in patients with rheumatoid arthritis and diabetes. Left untreated, the condition may get worse to the point where the finger becomes locked in flexion, like making a fist, or less commonly locked with the finger straightened out. °CAUSES  °· Inflammation and scarring that lead to swelling around the tendon sheath. °· Repeated or forceful movements. °· Rheumatoid arthritis, an autoimmune disease that affects joints. °· Gout. °· Diabetes mellitus. °SIGNS AND SYMPTOMS °· Soreness and swelling of your finger. °· A painful clicking or snapping as you bend and straighten your finger. °DIAGNOSIS  °Your health care provider will do a physical exam of your finger to diagnose trigger finger. °TREATMENT  °· Splinting for 6-8 weeks may be helpful. °· Nonsteroidal anti-inflammatory medicines (NSAIDs) can help to relieve the pain and inflammation. °· Cortisone injections, along with splinting, may speed up recovery. Several injections may be required. Cortisone may give relief after one injection. °· Surgery is another treatment that may be used if conservative treatments do not work. Surgery can be minor, without incisions (a cut does not have to be made), and can be done with a needle through the skin. °· Other surgical choices involve an open procedure in which the surgeon opens the hand through a small incision and cuts the pulley so the tendon can again slide smoothly. Your hand will still  work fine. °HOME CARE INSTRUCTIONS °· Apply ice to the injured area, twice per day: °¨ Put ice in a plastic bag. °¨ Place a towel between your skin and the bag. °¨ Leave the ice on for 20 minutes, 3-4 times a day. °· Rest your hand often. °MAKE SURE YOU:  °· Understand these instructions. °· Will watch your condition. °· Will get help right away if you are not doing well or get worse. °  °This information is not intended to replace advice given to you by your health care provider. Make sure you discuss any questions you have with your health care provider. °  °Document Released: 09/01/2004 Document Revised: 07/15/2013 Document Reviewed: 04/14/2013 °Elsevier Interactive Patient Education ©2016 [employer] ° °

## 2016-03-01 NOTE — ED Notes (Signed)
Patient called the second time for triage. No response. RN will reattempt to call patient one final time prior to eloping from the status board. Checked with registration staff who reports that they have not seen patient since she checked in.

## 2016-03-01 NOTE — ED Notes (Signed)
Patient called for the third time. No response. Patient eloped from the status board at this time. ED registration and charge nurse made aware so that in the event that patient presents back to the desk requesting to be seen. 

## 2016-03-01 NOTE — ED Provider Notes (Signed)
[facility] Emergency Department Provider Note  ____________________________________________  Time seen: 4:00 AM  I have reviewed the triage vital signs and the nursing notes.   HISTORY  Chief Complaint Hand Pain     HPI Carrie Larsen is a 70 y.o. female presents with left thumb pain times one day after shoveling bales of pine needles. Patient states the pain is worse with movement. Patient states her finger is held in an extended position and worse with flexing the thumB   Past Medical History  Diagnosis Date  . Essential hypertension   . Hyperlipidemia   . GERD (gastroesophageal reflux disease)   . Colon polyp 2011  . Colon cancer ([place]) 1992    T3, N1, M0.  . Cancer [name])     a. s/p partial left nephrectomy.  . Chest pain     a. 2012 ETT: no ischemia.  . Systolic murmur     Patient Active Problem List   Diagnosis Date Noted  . Left leg swelling 08/22/2015  . Systolic murmur   . Retrosternal chest pain 07/08/2015  . History of colonic polyps 04/16/2015  . Abdominal pain, left upper quadrant 05/11/2014  . Chest pain 03/08/2011  . Hyperlipidemia 03/08/2011  . HTN (hypertension) 03/08/2011    Past Surgical History  Procedure Laterality Date  . Appendectomy    . Abdominal hysterectomy    . Colon resection    . Nephrectomy      left  . Hernia repair    . Tonsillectomy    . Colonoscopy  03-14-10    Dr Bary Castilla, tubular adenoma at 25 cm.  . Colonoscopy with propofol N/A 06/07/2015    Procedure: COLONOSCOPY WITH PROPOFOL;  Surgeon: Robert Bellow, MD;  Location: [facility] ENDOSCOPY;  Service: Endoscopy;  Laterality: N/A;    Current Outpatient Rx  Name  Route  Sig  Dispense  Refill  . Butalbital-APAP-Caffeine (CAPACET) 50-325-40 MG capsule   Oral   Take 1 capsule by mouth every 4 (four) hours as needed for pain.   20 capsule   0   . esomeprazole (NEXIUM) 40 MG capsule   Oral   Take 40 mg by mouth daily before breakfast.            . hydrochlorothiazide (HYDRODIURIL) 25 MG tablet   Oral   Take 25 mg by mouth daily.          . metoprolol (LOPRESSOR) 50 MG tablet   Oral   Take 50 mg by mouth 2 (two) times daily.           . Multiple Vitamins-Minerals (MULTIVITAMIN WITH MINERALS) tablet   Oral   Take 1 tablet by mouth daily.         . naproxen (NAPROSYN) 500 MG tablet   Oral   Take 500 mg by mouth daily.         . nortriptyline (PAMELOR) 10 MG capsule   Oral   Take 10 mg by mouth daily.            Allergies Morphine and related  Family History  Problem Relation Age of Onset  . Cerebral aneurysm Father   . Colon cancer Mother     Social History Social History  Substance Use Topics  . Smoking status: Never Smoker   . Smokeless tobacco: Never Used  . Alcohol Use: No    Review of Systems  Constitutional: Negative for fever. Eyes: Negative for visual changes. ENT: Negative for sore throat. Cardiovascular: Negative  for chest pain. Respiratory: Negative for shortness of breath. Gastrointestinal: Negative for abdominal pain, vomiting and diarrhea. Genitourinary: Negative for dysuria. Musculoskeletal: Negative for back pain.Left thumb pain Skin: Negative for rash. Neurological: Negative for headaches, focal weakness or numbness.   10-point ROS otherwise negative.  ____________________________________________   PHYSICAL EXAM:  VITAL SIGNS: ED Triage Vitals  Enc Vitals Group     BP 03/01/16 0318 165/79 mmHg     Pulse Rate 03/01/16 0318 67     Resp 03/01/16 0318 16     Temp 03/01/16 0318 97.9 F (36.6 C)     Temp Source 03/01/16 0318 Oral     SpO2 03/01/16 0318 97 %     Weight 03/01/16 0318 245 lb (111.131 kg)     Height 03/01/16 0318 5\' 10"  (1.778 m)     Head Cir --      Peak Flow --      Pain Score 03/01/16 0319 5     Pain Loc --      Pain Edu? --      Excl. in [place]? --     Constitutional: Alert and oriented. Well appearing and in no distress. Eyes: Conjunctivae are  normal. PERRL. Normal extraocular movements. ENT   Head: Normocephalic and atraumatic.   Nose: No congestion/rhinnorhea.   Mouth/Throat: Mucous membranes are moist.   Neck: No stridor. Musculoskeletal: Nontender with normal range of motion in all extremities. No joint effusions.  No lower extremity tenderness nor edema. Neurologic:  Normal speech and language. No gross focal neurologic deficits are appreciated. Speech is normal.  Skin:  Skin is warm, dry and intact. No rash noted. Psychiatric: Mood and affect are normal. Speech and behavior are normal. Patient exhibits appropriate insight and judgment.    RADIOLOGY   DG Finger Thumb Left (Final result) Result time: 03/01/16 03:47:47   Final result by Rad Results In Interface (03/01/16 03:47:47)   Narrative:   CLINICAL DATA: Left thumb pain and swelling after injury. Bent thumb backwards while throwing pine needles untoward truck 2 days prior. Persistent pain.  EXAM: LEFT THUMB 2+V  COMPARISON: None.  FINDINGS: No fracture or dislocation. The alignment is maintained. 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 No focal soft tissue abnormality.  IMPRESSION: No fracture or dislocation of the left thumb.   Electronically Signed By: Jeb Levering M.D. On: 03/01/2016 03:47          INITIAL IMPRESSION / ASSESSMENT AND PLAN / ED COURSE  Pertinent labs & imaging results that were available during my care of the patient were reviewed by me and considered in my medical decision making (see chart for details).  Suspected trigger finger possible dislocation that has since relocated.  Finger splint applied  ____________________________________________   FINAL CLINICAL IMPRESSION(S) / ED DIAGNOSES  Final diagnoses:  Trigger finger, left      Gregor Hams, MD 03/01/16 0430

## 2016-03-01 NOTE — ED Notes (Signed)
Patient called for triage by this RN. No answer. Will reattempt. 

## 2016-04-18 ENCOUNTER — Encounter: Payer: Self-pay | Admitting: *Deleted

## 2016-04-24 ENCOUNTER — Ambulatory Visit: Payer: Medicare Other | Admitting: Anesthesiology

## 2016-04-24 ENCOUNTER — Ambulatory Visit
Admission: RE | Admit: 2016-04-24 | Discharge: 2016-04-24 | Disposition: A | Discharge status: Home / Self Care | Payer: Medicare Other | Source: Ambulatory Visit | Attending: Ophthalmology | Admitting: Ophthalmology

## 2016-04-24 ENCOUNTER — Encounter: Payer: Self-pay | Admitting: *Deleted

## 2016-04-24 ENCOUNTER — Encounter
Admission: RE | Disposition: A | Discharge status: Home / Self Care | Payer: Self-pay | Source: Ambulatory Visit | Attending: Ophthalmology

## 2016-04-24 DIAGNOSIS — Z905 Acquired absence of kidney: Secondary | ICD-10-CM | POA: Insufficient documentation

## 2016-04-24 DIAGNOSIS — E785 Hyperlipidemia, unspecified: Secondary | ICD-10-CM | POA: Insufficient documentation

## 2016-04-24 DIAGNOSIS — H2512 Age-related nuclear cataract, left eye: Secondary | ICD-10-CM | POA: Diagnosis not present

## 2016-04-24 DIAGNOSIS — E7800 Pure hypercholesterolemia, unspecified: Secondary | ICD-10-CM | POA: Insufficient documentation

## 2016-04-24 DIAGNOSIS — I10 Essential (primary) hypertension: Secondary | ICD-10-CM | POA: Insufficient documentation

## 2016-04-24 DIAGNOSIS — Z90710 Acquired absence of both cervix and uterus: Secondary | ICD-10-CM | POA: Diagnosis not present

## 2016-04-24 DIAGNOSIS — K219 Gastro-esophageal reflux disease without esophagitis: Secondary | ICD-10-CM | POA: Insufficient documentation

## 2016-04-24 DIAGNOSIS — Z79899 Other long term (current) drug therapy: Secondary | ICD-10-CM | POA: Diagnosis not present

## 2016-04-24 DIAGNOSIS — Z85038 Personal history of other malignant neoplasm of large intestine: Secondary | ICD-10-CM | POA: Insufficient documentation

## 2016-04-24 DIAGNOSIS — R011 Cardiac murmur, unspecified: Secondary | ICD-10-CM | POA: Insufficient documentation

## 2016-04-24 HISTORY — PX: CATARACT EXTRACTION W/PHACO: SHX586

## 2016-04-24 SURGERY — PHACOEMULSIFICATION, CATARACT, WITH IOL INSERTION
Anesthesia: Monitor Anesthesia Care | Site: Eye | Laterality: Left | Wound class: Clean

## 2016-04-24 MED ORDER — MOXIFLOXACIN HCL 0.5 % OP SOLN: 1.0000 [drp] | OPHTHALMIC | Status: DC | PRN

## 2016-04-24 MED ORDER — TETRACAINE HCL 0.5 % OP SOLN
1.0000 [drp] | Freq: Once | OPHTHALMIC | Status: AC
  Administered 2016-04-24: 1 [drp] via OPHTHALMIC

## 2016-04-24 MED ORDER — FENTANYL CITRATE (PF) 100 MCG/2ML IJ SOLN
INTRAMUSCULAR | Status: DC | PRN
  Administered 2016-04-24: 50 ug via INTRAVENOUS

## 2016-04-24 MED ORDER — MIDAZOLAM HCL 2 MG/2ML IJ SOLN
INTRAMUSCULAR | Status: DC | PRN
  Administered 2016-04-24: 1 mg via INTRAVENOUS

## 2016-04-24 MED ORDER — TETRACAINE HCL 0.5 % OP SOLN
OPHTHALMIC | Status: AC
  Filled 2016-04-24: qty 2

## 2016-04-24 MED ORDER — MOXIFLOXACIN HCL 0.5 % OP SOLN
OPHTHALMIC | Status: DC | PRN
  Administered 2016-04-24: 1 [drp] via OPHTHALMIC

## 2016-04-24 MED ORDER — EPINEPHRINE HCL 1 MG/ML IJ SOLN
INTRAOCULAR | Status: DC | PRN
  Administered 2016-04-24: 12:00:00 via OPHTHALMIC

## 2016-04-24 MED ORDER — CARBACHOL 0.01 % IO SOLN
INTRAOCULAR | Status: DC | PRN
  Administered 2016-04-24: 0.5 mL via INTRAOCULAR

## 2016-04-24 MED ORDER — CEFUROXIME OPHTHALMIC INJECTION 1 MG/0.1 ML
INJECTION | OPHTHALMIC | Status: DC | PRN
  Administered 2016-04-24: 0.1 mL via INTRACAMERAL

## 2016-04-24 MED ORDER — POVIDONE-IODINE 5 % OP SOLN
1.0000 "application " | Freq: Once | OPHTHALMIC | Status: AC
  Administered 2016-04-24: 1 via OPHTHALMIC

## 2016-04-24 MED ORDER — SODIUM CHLORIDE 0.9 % IV SOLN
INTRAVENOUS | Status: DC
  Administered 2016-04-24: 12:00:00 via INTRAVENOUS

## 2016-04-24 MED ORDER — TETRACAINE HCL 0.5 % OP SOLN
OPHTHALMIC | Status: AC
  Administered 2016-04-24: 1 [drp] via OPHTHALMIC
  Filled 2016-04-24: qty 2

## 2016-04-24 MED ORDER — NA CHONDROIT SULF-NA HYALURON 40-17 MG/ML IO SOLN
INTRAOCULAR | Status: DC | PRN
  Administered 2016-04-24: 1 mL via INTRAOCULAR

## 2016-04-24 MED ORDER — ARMC OPHTHALMIC DILATING GEL
1.0000 "application " | OPHTHALMIC | Status: AC | PRN
  Administered 2016-04-24 (×2): 1 via OPHTHALMIC

## 2016-04-24 MED ORDER — ARMC OPHTHALMIC DILATING GEL
OPHTHALMIC | Status: AC
  Administered 2016-04-24: 1 via OPHTHALMIC
  Filled 2016-04-24: qty 0.25

## 2016-04-24 MED ORDER — NA CHONDROIT SULF-NA HYALURON 40-17 MG/ML IO SOLN
INTRAOCULAR | Status: AC
  Filled 2016-04-24: qty 1

## 2016-04-24 MED ORDER — EPINEPHRINE HCL 1 MG/ML IJ SOLN
INTRAMUSCULAR | Status: AC
  Filled 2016-04-24: qty 1

## 2016-04-24 MED ORDER — MOXIFLOXACIN HCL 0.5 % OP SOLN
OPHTHALMIC | Status: AC
  Filled 2016-04-24: qty 3

## 2016-04-24 MED ORDER — POVIDONE-IODINE 5 % OP SOLN
OPHTHALMIC | Status: AC
Start: 2016-04-24 — End: 2016-04-24
  Administered 2016-04-24: 1 via OPHTHALMIC
  Filled 2016-04-24: qty 30

## 2016-04-24 SURGICAL SUPPLY — 21 items
CANNULA ANT/CHMB 27GA (MISCELLANEOUS) ×3 IMPLANT
CUP MEDICINE 2OZ PLAST GRAD ST (MISCELLANEOUS) ×3 IMPLANT
GLOVE BIO SURGEON STRL SZ8 (GLOVE) ×3 IMPLANT
GLOVE BIOGEL M 6.5 STRL (GLOVE) ×3 IMPLANT
GLOVE SURG LX 8.0 MICRO (GLOVE) ×2
GLOVE SURG LX STRL 8.0 MICRO (GLOVE) ×1 IMPLANT
GOWN STRL REUS W/ TWL LRG LVL3 (GOWN DISPOSABLE) ×2 IMPLANT
GOWN STRL REUS W/TWL LRG LVL3 (GOWN DISPOSABLE) ×4
LENS IOL TECNIS ITEC 23.0 (Intraocular Lens) ×3 IMPLANT
PACK CATARACT (MISCELLANEOUS) ×3 IMPLANT
PACK CATARACT BRASINGTON LX (MISCELLANEOUS) ×3 IMPLANT
PACK EYE AFTER SURG (MISCELLANEOUS) ×3 IMPLANT
SOL BSS BAG (MISCELLANEOUS) ×3
SOL PREP PVP 2OZ (MISCELLANEOUS) ×3
SOLUTION BSS BAG (MISCELLANEOUS) ×1 IMPLANT
SOLUTION PREP PVP 2OZ (MISCELLANEOUS) ×1 IMPLANT
SYR 3ML LL SCALE MARK (SYRINGE) ×3 IMPLANT
SYR 5ML LL (SYRINGE) ×3 IMPLANT
SYR TB 1ML 27GX1/2 LL (SYRINGE) ×3 IMPLANT
WATER STERILE IRR 1000ML POUR (IV SOLUTION) ×3 IMPLANT
WIPE NON LINTING 3.25X3.25 (MISCELLANEOUS) ×3 IMPLANT

## 2016-04-24 NOTE — Transfer of Care (Signed)
Immediate Anesthesia Transfer of Care Note  Patient: Carrie Larsen  Procedure(s) Performed: Procedure(s) with comments: CATARACT EXTRACTION PHACO AND INTRAOCULAR LENS PLACEMENT (IOC) (Left) - [place] 45.8 AP% 16.4 CDE 7.53 FLUID PACK LOT # [id number]  Patient Location: PACU  Anesthesia Type:MAC  Level of Consciousness: awake, alert  and oriented  Airway & Oxygen Therapy: Patient Spontanous Breathing  Post-op Assessment: Report given to RN and Post -op Vital signs reviewed and stable  Post vital signs: Reviewed and stable  Last Vitals:  Filed Vitals:   04/24/16 1117 04/24/16 1200  BP: 164/79 126/77  Pulse: 65 64  Temp: 36.6 C 36.1 C  Resp: 18 16    Last Pain: There were no vitals filed for this visit.       Complications: No apparent anesthesia complications

## 2016-04-24 NOTE — Anesthesia Postprocedure Evaluation (Incomplete)
Anesthesia Post Note  Patient: Carrie Larsen  Procedure(s) Performed: Procedure(s) (LRB): CATARACT EXTRACTION PHACO AND INTRAOCULAR LENS PLACEMENT (IOC) (Left)  Anesthesia Post Evaluation  Last Vitals:  Filed Vitals:   04/24/16 1117 04/24/16 1200  BP: 164/79 126/77  Pulse: 65 64  Temp: 36.6 C 36.1 C  Resp: 18 16    Last Pain: There were no vitals filed for this visit.               [place],  Diego Cory

## 2016-04-24 NOTE — H&P (Signed)
  All labs reviewed. Abnormal studies sent to patients PCP when indicated.  Previous H&P reviewed, patient examined, there are NO CHANGES.  Carrie Dayrit LOUIS5/30/201712:20 PM

## 2016-04-24 NOTE — Anesthesia Postprocedure Evaluation (Signed)
Anesthesia Post Note  Patient: Carrie Larsen  Procedure(s) Performed: Procedure(s) (LRB): CATARACT EXTRACTION PHACO AND INTRAOCULAR LENS PLACEMENT (IOC) (Left)  Patient location during evaluation: PACU Anesthesia Type: MAC Level of consciousness: awake and alert Pain management: pain level controlled Vital Signs Assessment: post-procedure vital signs reviewed and stable Respiratory status: spontaneous breathing, nonlabored ventilation, respiratory function stable and patient connected to nasal cannula oxygen Cardiovascular status: stable and blood pressure returned to baseline Anesthetic complications: no    Last Vitals:  Filed Vitals:   04/24/16 1246 04/24/16 1254  BP: 150/76 162/79  Pulse: 68 64  Temp: 36.8 C   Resp: 16 16    Last Pain: There were no vitals filed for this visit.               Precious Haws Bensyn Bornemann

## 2016-04-24 NOTE — Anesthesia Preprocedure Evaluation (Signed)
Anesthesia Evaluation  Patient identified by MRN, date of birth, ID band Patient awake    Reviewed: Allergy & Precautions, H&P , NPO status , Patient's Chart, lab work & pertinent test results  History of Anesthesia Complications Negative for: history of anesthetic complications  Airway Mallampati: III  TM Distance: <3 FB Neck ROM: limited    Dental  (+) Poor Dentition, Chipped   Pulmonary neg pulmonary ROS, neg shortness of breath,    Pulmonary exam normal breath sounds clear to auscultation       Cardiovascular Exercise Tolerance: Good hypertension, (-) angina(-) Past MI and (-) PND Normal cardiovascular exam+ Valvular Problems/Murmurs  Rhythm:regular Rate:Normal     Neuro/Psych negative neurological ROS  negative psych ROS   GI/Hepatic Neg liver ROS, GERD  Controlled,  Endo/Other  negative endocrine ROS  Renal/GU negative Renal ROS  negative genitourinary   Musculoskeletal   Abdominal   Peds  Hematology negative hematology ROS (+)   Anesthesia Other Findings Past Medical History:   Essential hypertension                                       Hyperlipidemia                                               GERD (gastroesophageal reflux disease)                       Colon polyp                                     2011         Chest pain                                                     Comment:a. 2012 ETT: no ischemia.   Systolic murmur                                              Colon cancer ([place])                              1992           Comment:T3, N1, M0.   Cancer ([facility])                                                   Comment:a. s/p partial left nephrectomy.  Past Surgical History:   APPENDECTOMY  ABDOMINAL HYSTERECTOMY                                        COLON RESECTION                                               NEPHRECTOMY                                                      Comment:left   HERNIA REPAIR                                                 TONSILLECTOMY                                                 COLONOSCOPY                                      03-14-10        Comment:Dr Byrnett, tubular adenoma at 25 cm.   COLONOSCOPY WITH PROPOFOL                       N/A 06/07/2015      Comment:Procedure: COLONOSCOPY WITH PROPOFOL;  Surgeon:              Robert Bellow, MD;  Location: [facility]               ENDOSCOPY;  Service: Endoscopy;  Laterality:               N/A;   COLON SURGERY                                                 RIB RESECTION                                                   Comment:removal 4 ribs  BMI    Body Mass Index   35.01 kg/m 2      Reproductive/Obstetrics negative OB ROS                             Anesthesia Physical Anesthesia Plan  ASA: III  Anesthesia Plan: MAC   Post-op Pain Management:    Induction:   Airway Management Planned:   Additional Equipment:   Intra-op Plan:   Post-operative Plan:   Informed Consent: I have reviewed the patients History and Physical, chart,  labs and discussed the procedure including the risks, benefits and alternatives for the proposed anesthesia with the patient or authorized representative who has indicated his/her understanding and acceptance.   Dental Advisory Given  Plan Discussed with: Anesthesiologist, CRNA and Surgeon  Anesthesia Plan Comments:         Anesthesia Quick Evaluation

## 2016-04-24 NOTE — Op Note (Signed)
PREOPERATIVE DIAGNOSIS:  Nuclear sclerotic cataract of the left eye.   POSTOPERATIVE DIAGNOSIS:  NUCLEAR SCLEROTIC CATARACT LEFT EYE   OPERATIVE PROCEDURE:  Procedure(s): CATARACT EXTRACTION PHACO AND INTRAOCULAR LENS PLACEMENT (IOC)   SURGEON:  Birder Robson, MD.   ANESTHESIA:   Anesthesiologist: Andria Frames, MD CRNA: Jenetta Downer, CRNA  1.      Managed anesthesia care. 2.      Topical tetracaine drops followed by 2% Xylocaine jelly applied in the preoperative holding area.   COMPLICATIONS:  None.   TECHNIQUE:   Stop and chop   DESCRIPTION OF PROCEDURE:  The patient was examined and consented in the preoperative holding area where the aforementioned topical anesthesia was applied to the left eye and then brought back to the Operating Room where the left eye was prepped and draped in the usual sterile ophthalmic fashion and a lid speculum was placed. A paracentesis was created with the side port blade and the anterior chamber was filled with viscoelastic. A near clear corneal incision was performed with the steel keratome. A continuous curvilinear capsulorrhexis was performed with a cystotome followed by the capsulorrhexis forceps. Hydrodissection and hydrodelineation were carried out with BSS on a blunt cannula. The lens was removed in a stop and chop  technique and the remaining cortical material was removed with the irrigation-aspiration handpiece. The capsular bag was inflated with viscoelastic and the Technis [id number] lens was placed in the capsular bag without complication. The remaining viscoelastic was removed from the eye with the irrigation-aspiration handpiece. The wounds were hydrated. The anterior chamber was flushed with Miostat and the eye was inflated to physiologic pressure. 0.1 mL of cefuroxime concentration 10 mg/mL was placed in the anterior chamber. The wounds were found to be water tight. The eye was dressed with Vigamox. The patient was given protective  glasses to wear throughout the day and a shield with which to sleep tonight. The patient was also given drops with which to begin a drop regimen today and will follow-up with me in one day.  Implant Name Type Inv. Item Serial No. Manufacturer Lot No. LRB No. Used  LENS IOL DIOP 23.0 - [id number] Intraocular Lens LENS IOL DIOP 23.0 [id number]   Left 1   Procedure(s) with comments: CATARACT EXTRACTION PHACO AND INTRAOCULAR LENS PLACEMENT (IOC) (Left) - [place] 45.8 AP% 16.4 CDE 7.53 FLUID PACK LOT # [place]:[id number]  Electronically signed: Tim Lair 04/24/2016 12:43 PM

## 2016-04-24 NOTE — Discharge Instructions (Signed)
Eye Surgery Discharge Instructions  Expect mild scratchy sensation or mild soreness. DO NOT RUB YOUR EYE!  The day of surgery:  Minimal physical activity, but bed rest is not required  No reading, computer work, or close hand work  No bending, lifting, or straining.  May watch TV  For 24 hours:  No driving, legal decisions, or alcoholic beverages  Safety precautions  Eat anything you prefer: It is better to start with liquids, then soup then solid foods.  _____ Eye patch should be worn until postoperative exam tomorrow.  ____ Solar shield eyeglasses should be worn for comfort in the sunlight/patch while sleeping  Resume all regular medications including aspirin or Coumadin if these were discontinued prior to surgery. You may shower, bathe, shave, or wash your hair. Tylenol may be taken for mild discomfort.  Call your doctor if you experience significant pain, nausea, or vomiting, fever > 101 or other signs of infection. [phone] or [phone] Specific instructions:  Follow-up Information    Follow up with PORFILIO,Carrie LOUIS, MD In 1 day.   Specialty:  Ophthalmology   Why:  May 31 at 9:10am   Contact information:   [address] [place] [address] [phone]

## 2016-05-31 ENCOUNTER — Encounter: Payer: Self-pay | Admitting: *Deleted

## 2016-06-05 ENCOUNTER — Ambulatory Visit
Admission: RE | Admit: 2016-06-05 | Discharge: 2016-06-05 | Disposition: A | Discharge status: Home / Self Care | Payer: Medicare Other | Source: Ambulatory Visit | Attending: Ophthalmology | Admitting: Ophthalmology

## 2016-06-05 ENCOUNTER — Encounter: Payer: Self-pay | Admitting: *Deleted

## 2016-06-05 ENCOUNTER — Encounter
Admission: RE | Disposition: A | Discharge status: Home / Self Care | Payer: Self-pay | Source: Ambulatory Visit | Attending: Ophthalmology

## 2016-06-05 ENCOUNTER — Ambulatory Visit: Payer: Medicare Other | Admitting: Anesthesiology

## 2016-06-05 DIAGNOSIS — Z905 Acquired absence of kidney: Secondary | ICD-10-CM | POA: Diagnosis not present

## 2016-06-05 DIAGNOSIS — I10 Essential (primary) hypertension: Secondary | ICD-10-CM | POA: Diagnosis not present

## 2016-06-05 DIAGNOSIS — Z85038 Personal history of other malignant neoplasm of large intestine: Secondary | ICD-10-CM | POA: Insufficient documentation

## 2016-06-05 DIAGNOSIS — Z79899 Other long term (current) drug therapy: Secondary | ICD-10-CM | POA: Diagnosis not present

## 2016-06-05 DIAGNOSIS — Z885 Allergy status to narcotic agent status: Secondary | ICD-10-CM | POA: Diagnosis not present

## 2016-06-05 DIAGNOSIS — Z9049 Acquired absence of other specified parts of digestive tract: Secondary | ICD-10-CM | POA: Insufficient documentation

## 2016-06-05 DIAGNOSIS — E785 Hyperlipidemia, unspecified: Secondary | ICD-10-CM | POA: Diagnosis not present

## 2016-06-05 DIAGNOSIS — E7800 Pure hypercholesterolemia, unspecified: Secondary | ICD-10-CM | POA: Insufficient documentation

## 2016-06-05 DIAGNOSIS — Z9842 Cataract extraction status, left eye: Secondary | ICD-10-CM | POA: Diagnosis not present

## 2016-06-05 DIAGNOSIS — K219 Gastro-esophageal reflux disease without esophagitis: Secondary | ICD-10-CM | POA: Insufficient documentation

## 2016-06-05 DIAGNOSIS — Z90710 Acquired absence of both cervix and uterus: Secondary | ICD-10-CM | POA: Insufficient documentation

## 2016-06-05 DIAGNOSIS — R011 Cardiac murmur, unspecified: Secondary | ICD-10-CM | POA: Insufficient documentation

## 2016-06-05 DIAGNOSIS — H2511 Age-related nuclear cataract, right eye: Secondary | ICD-10-CM | POA: Insufficient documentation

## 2016-06-05 HISTORY — PX: CATARACT EXTRACTION W/PHACO: SHX586

## 2016-06-05 IMAGING — CR DG ABDOMEN 1V
1 series · 2 of 2 positions shown · non-contrast
Comparison: 05/01/2014

CLINICAL DATA: Central abdominal pain. No bowel movement in 6 days.
Took laxative last night. Nausea. Sweating. Normal colonoscopy 2
weeks ago.

EXAM:
ABDOMEN - 1 VIEW

[Series 1: dg abd 1 view · 0.14mm/px · 2 of 2 slices shown]
[im 1/2]
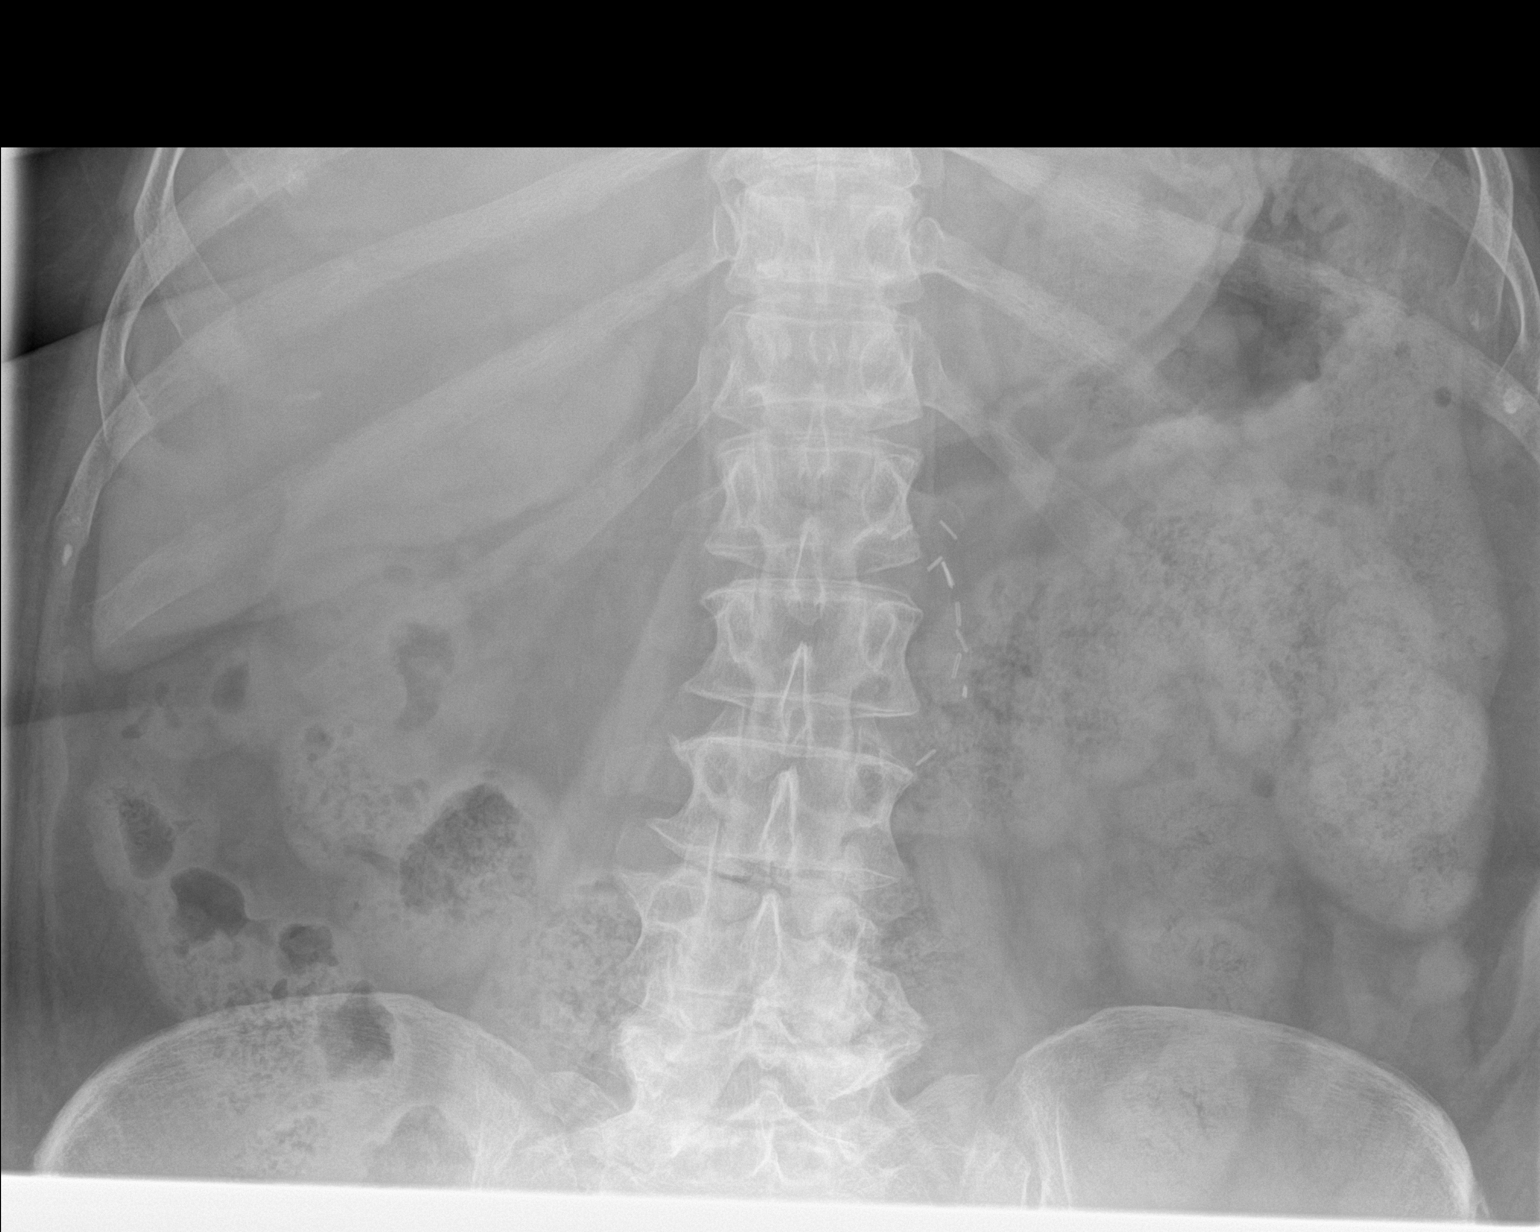
[im 2/2]
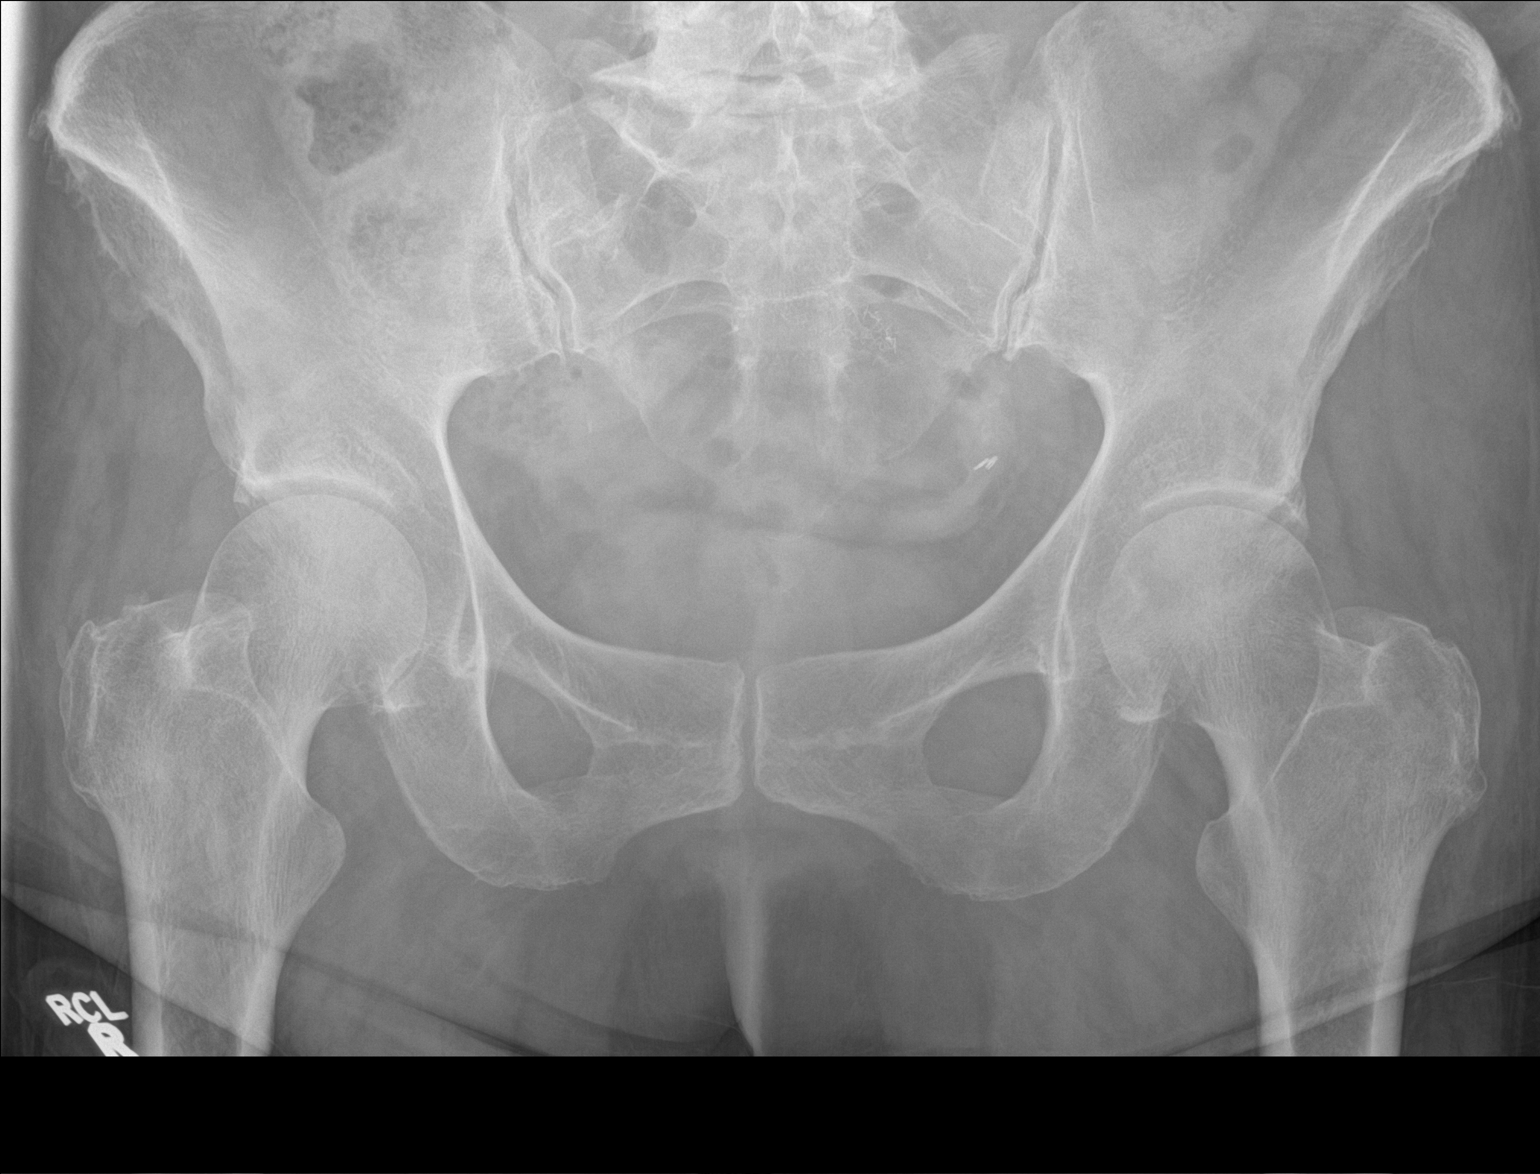

[2 of 2 positions shown; findings below may reference images not displayed]

FINDINGS: This formed stool in the ascending, transverse, and descending
colon.

Small metal clips are noted along the left retroperitoneum likely
related to the left nephrectomy. Lower lumbar spondylosis. Lumbar
spondylosis noted.

No dilated bowel. No specific findings of urinary tract calculi. No
findings of organomegaly.
IMPRESSION: 1. Unremarkable bowel gas pattern.
2. Lumbar spondylosis.

## 2016-06-05 SURGERY — PHACOEMULSIFICATION, CATARACT, WITH IOL INSERTION
Anesthesia: Monitor Anesthesia Care | Site: Eye | Laterality: Right | Wound class: Clean

## 2016-06-05 MED ORDER — MOXIFLOXACIN HCL 0.5 % OP SOLN: 1.0000 [drp] | OPHTHALMIC | Status: DC | PRN

## 2016-06-05 MED ORDER — SODIUM CHLORIDE 0.9 % IV SOLN
INTRAVENOUS | Status: DC
  Administered 2016-06-05: 50 mL/h via INTRAVENOUS

## 2016-06-05 MED ORDER — TETRACAINE HCL 0.5 % OP SOLN
1.0000 [drp] | Freq: Once | OPHTHALMIC | Status: AC
  Administered 2016-06-05: 1 [drp] via OPHTHALMIC

## 2016-06-05 MED ORDER — CARBACHOL 0.01 % IO SOLN
INTRAOCULAR | Status: DC | PRN
  Administered 2016-06-05: 0.5 mL via INTRAOCULAR

## 2016-06-05 MED ORDER — POVIDONE-IODINE 5 % OP SOLN
OPHTHALMIC | Status: AC
  Administered 2016-06-05: 1 via OPHTHALMIC
  Filled 2016-06-05: qty 30

## 2016-06-05 MED ORDER — ONDANSETRON HCL 4 MG/2ML IJ SOLN: 4.0000 mg | Freq: Once | INTRAMUSCULAR | Status: DC | PRN

## 2016-06-05 MED ORDER — FENTANYL CITRATE (PF) 100 MCG/2ML IJ SOLN
INTRAMUSCULAR | Status: DC | PRN
  Administered 2016-06-05: 50 ug via INTRAVENOUS

## 2016-06-05 MED ORDER — ARMC OPHTHALMIC DILATING GEL
OPHTHALMIC | Status: AC
  Administered 2016-06-05: 1 via OPHTHALMIC
  Filled 2016-06-05: qty 0.25

## 2016-06-05 MED ORDER — MOXIFLOXACIN HCL 0.5 % OP SOLN
OPHTHALMIC | Status: DC | PRN
  Administered 2016-06-05: 1 [drp] via OPHTHALMIC

## 2016-06-05 MED ORDER — POVIDONE-IODINE 5 % OP SOLN
1.0000 "application " | Freq: Once | OPHTHALMIC | Status: AC
  Administered 2016-06-05: 1 via OPHTHALMIC

## 2016-06-05 MED ORDER — EPINEPHRINE HCL 1 MG/ML IJ SOLN
INTRAMUSCULAR | Status: AC
  Filled 2016-06-05: qty 1

## 2016-06-05 MED ORDER — MOXIFLOXACIN HCL 0.5 % OP SOLN
OPHTHALMIC | Status: AC
  Filled 2016-06-05: qty 3

## 2016-06-05 MED ORDER — ARMC OPHTHALMIC DILATING GEL
1.0000 "application " | OPHTHALMIC | Status: AC | PRN
  Administered 2016-06-05 (×2): 1 via OPHTHALMIC

## 2016-06-05 MED ORDER — FENTANYL CITRATE (PF) 100 MCG/2ML IJ SOLN: 25.0000 ug | INTRAMUSCULAR | Status: DC | PRN

## 2016-06-05 MED ORDER — MIDAZOLAM HCL 2 MG/2ML IJ SOLN
INTRAMUSCULAR | Status: DC | PRN
  Administered 2016-06-05 (×2): 1 mg via INTRAVENOUS

## 2016-06-05 MED ORDER — NA CHONDROIT SULF-NA HYALURON 40-17 MG/ML IO SOLN
INTRAOCULAR | Status: AC
  Filled 2016-06-05: qty 1

## 2016-06-05 MED ORDER — CEFUROXIME OPHTHALMIC INJECTION 1 MG/0.1 ML
INJECTION | OPHTHALMIC | Status: AC
  Filled 2016-06-05: qty 0.1

## 2016-06-05 MED ORDER — CEFUROXIME OPHTHALMIC INJECTION 1 MG/0.1 ML
INJECTION | OPHTHALMIC | Status: DC | PRN
  Administered 2016-06-05: 0.1 mL via INTRACAMERAL

## 2016-06-05 MED ORDER — NA CHONDROIT SULF-NA HYALURON 40-17 MG/ML IO SOLN
INTRAOCULAR | Status: DC | PRN
  Administered 2016-06-05: 1 mL via INTRAOCULAR

## 2016-06-05 MED ORDER — TETRACAINE HCL 0.5 % OP SOLN
OPHTHALMIC | Status: AC
  Administered 2016-06-05: 1 [drp] via OPHTHALMIC
  Filled 2016-06-05: qty 2

## 2016-06-05 MED ORDER — EPINEPHRINE HCL 1 MG/ML IJ SOLN
INTRAOCULAR | Status: DC | PRN
  Administered 2016-06-05: 12:00:00 via OPHTHALMIC

## 2016-06-05 SURGICAL SUPPLY — 21 items
CANNULA ANT/CHMB 27GA (MISCELLANEOUS) ×3 IMPLANT
CUP MEDICINE 2OZ PLAST GRAD ST (MISCELLANEOUS) ×3 IMPLANT
GLOVE BIO SURGEON STRL SZ8 (GLOVE) ×3 IMPLANT
GLOVE BIOGEL M 6.5 STRL (GLOVE) ×3 IMPLANT
GLOVE SURG LX 8.0 MICRO (GLOVE) ×2
GLOVE SURG LX STRL 8.0 MICRO (GLOVE) ×1 IMPLANT
GOWN STRL REUS W/ TWL LRG LVL3 (GOWN DISPOSABLE) ×2 IMPLANT
GOWN STRL REUS W/TWL LRG LVL3 (GOWN DISPOSABLE) ×4
LENS IOL TECNIS ITEC 22.5 (Intraocular Lens) ×3 IMPLANT
PACK CATARACT (MISCELLANEOUS) ×3 IMPLANT
PACK CATARACT BRASINGTON LX (MISCELLANEOUS) ×3 IMPLANT
PACK EYE AFTER SURG (MISCELLANEOUS) ×3 IMPLANT
SOL BSS BAG (MISCELLANEOUS) ×3
SOL PREP PVP 2OZ (MISCELLANEOUS) ×3
SOLUTION BSS BAG (MISCELLANEOUS) ×1 IMPLANT
SOLUTION PREP PVP 2OZ (MISCELLANEOUS) ×1 IMPLANT
SYR 3ML LL SCALE MARK (SYRINGE) ×3 IMPLANT
SYR 5ML LL (SYRINGE) ×3 IMPLANT
SYR TB 1ML 27GX1/2 LL (SYRINGE) ×3 IMPLANT
WATER STERILE IRR 1000ML POUR (IV SOLUTION) ×3 IMPLANT
WIPE NON LINTING 3.25X3.25 (MISCELLANEOUS) ×3 IMPLANT

## 2016-06-05 NOTE — Transfer of Care (Signed)
Immediate Anesthesia Transfer of Care Note  Patient: Eilley Lebreton  Procedure(s) Performed: Procedure(s) with comments: CATARACT EXTRACTION PHACO AND INTRAOCULAR LENS PLACEMENT (IOC) (Right) - [place] 25.9 AP% 21.9 CDE 5.68 Fluid pack lot # [id number]  Patient Location: PACU  Anesthesia Type:MAC  Level of Consciousness: awake, alert  and oriented  Airway & Oxygen Therapy: Patient Spontanous Breathing  Post-op Assessment: Report given to RN and Post -op Vital signs reviewed and stable  Post vital signs: Reviewed and stable  Last Vitals:  Filed Vitals:   06/05/16 1037  BP: 150/80  Pulse: 68  Temp: 36.5 C  Resp: 17    Last Pain: There were no vitals filed for this visit.    Patients Stated Pain Goal: 0 ([id number])  Complications: No apparent anesthesia complications

## 2016-06-05 NOTE — Op Note (Signed)
PREOPERATIVE DIAGNOSIS:  Nuclear sclerotic cataract of the right eye.   POSTOPERATIVE DIAGNOSIS: NUCLEAR SCLEROTIC CATARACT RIGHT EYE   OPERATIVE PROCEDURE:  Procedure(s): CATARACT EXTRACTION PHACO AND INTRAOCULAR LENS PLACEMENT (IOC)   SURGEON:  Birder Robson, MD.   ANESTHESIA:  Anesthesiologist: Alvin Critchley, MD CRNA: Jenetta Downer, CRNA  1.      Managed anesthesia care. 2.      Topical tetracaine drops followed by 2% Xylocaine jelly applied in the preoperative holding area.   COMPLICATIONS:  None.   TECHNIQUE:   Stop and chop   DESCRIPTION OF PROCEDURE:  The patient was examined and consented in the preoperative holding area where the aforementioned topical anesthesia was applied to the right eye and then brought back to the Operating Room where the right eye was prepped and draped in the usual sterile ophthalmic fashion and a lid speculum was placed. A paracentesis was created with the side port blade and the anterior chamber was filled with viscoelastic. A near clear corneal incision was performed with the steel keratome. A continuous curvilinear capsulorrhexis was performed with a cystotome followed by the capsulorrhexis forceps. Hydrodissection and hydrodelineation were carried out with BSS on a blunt cannula. The lens was removed in a stop and chop  technique and the remaining cortical material was removed with the irrigation-aspiration handpiece. The capsular bag was inflated with viscoelastic and the Technis [id number]  lens was placed in the capsular bag without complication. The remaining viscoelastic was removed from the eye with the irrigation-aspiration handpiece. The wounds were hydrated. The anterior chamber was flushed with Miostat and the eye was inflated to physiologic pressure. 0.1 mL of cefuroxime concentration 10 mg/mL was placed in the anterior chamber. The wounds were found to be water tight. The eye was dressed with Vigamox. The patient was given protective  glasses to wear throughout the day and a shield with which to sleep tonight. The patient was also given drops with which to begin a drop regimen today and will follow-up with me in one day.  Implant Name Type Inv. Item Serial No. Manufacturer Lot No. LRB No. Used  LENS IOL DIOP 22.5 - [id number] Intraocular Lens LENS IOL DIOP 22.5 [id number]   Right 1   Procedure(s) with comments: CATARACT EXTRACTION PHACO AND INTRAOCULAR LENS PLACEMENT (IOC) (Right) - [place] 25.9 AP% 21.9 CDE 5.68 Fluid pack lot # [id number]  Electronically signed: [place] 06/05/2016 12:25 PM

## 2016-06-05 NOTE — H&P (Signed)
  All labs reviewed. Abnormal studies sent to patients PCP when indicated.  Previous H&P reviewed, patient examined, there are NO CHANGES.  Carrie Larsen LOUIS7/11/201712:01 PM

## 2016-06-05 NOTE — Anesthesia Preprocedure Evaluation (Signed)
Anesthesia Evaluation  Patient identified by MRN, date of birth, ID band Patient awake    Reviewed: Allergy & Precautions, H&P , NPO status , Patient's Chart, lab work & pertinent test results, reviewed documented beta blocker date and time   History of Anesthesia Complications Negative for: history of anesthetic complications  Airway Mallampati: III  TM Distance: <3 FB Neck ROM: limited    Dental  (+) Poor Dentition, Chipped   Pulmonary neg pulmonary ROS, neg shortness of breath,    Pulmonary exam normal breath sounds clear to auscultation       Cardiovascular Exercise Tolerance: Good hypertension, Pt. on medications and Pt. on home beta blockers (-) angina(-) Past MI and (-) PND Normal cardiovascular exam+ Valvular Problems/Murmurs  Rhythm:regular Rate:Normal  Chest pain appears to be non cardiac in origin   Neuro/Psych negative neurological ROS  negative psych ROS   GI/Hepatic Neg liver ROS, GERD  Medicated and Controlled,  Endo/Other  negative endocrine ROS  Renal/GU negative Renal ROS  negative genitourinary   Musculoskeletal   Abdominal   Peds negative pediatric ROS (+)  Hematology negative hematology ROS (+)   Anesthesia Other Findings Past Medical History:   Essential hypertension                                       Hyperlipidemia                                               GERD (gastroesophageal reflux disease)                       Colon polyp                                     2011         Chest pain                                                     Comment:a. 2012 ETT: no ischemia.   Systolic murmur                                              Colon cancer ([place])                              1992           Comment:T3, N1, M0.   Cancer ([facility])                                                   Comment:a. s/p partial left nephrectomy.  Past Surgical History:   APPENDECTOMY  ABDOMINAL HYSTERECTOMY                                        COLON RESECTION                                               NEPHRECTOMY                                                     Comment:left   HERNIA REPAIR                                                 TONSILLECTOMY                                                 COLONOSCOPY                                      03-14-10        Comment:Dr Byrnett, tubular adenoma at 25 cm.   COLONOSCOPY WITH PROPOFOL                       N/A 06/07/2015      Comment:Procedure: COLONOSCOPY WITH PROPOFOL;  Surgeon:              Robert Bellow, MD;  [facility]               ENDOSCOPY;  Service: Endoscopy;  Laterality:               N/A;   COLON SURGERY                                                 RIB RESECTION                                                   Comment:removal 4 ribs  BMI    Body Mass Index   35.01 kg/m 2      Reproductive/Obstetrics negative OB ROS                             Anesthesia Physical  Anesthesia Plan  ASA: III  Anesthesia Plan: MAC   Post-op Pain Management:    Induction:   Airway Management Planned:   Additional Equipment:   Intra-op Plan:   Post-operative Plan:   Informed Consent: I have reviewed the patients History and Physical, chart,  labs and discussed the procedure including the risks, benefits and alternatives for the proposed anesthesia with the patient or authorized representative who has indicated his/her understanding and acceptance.   Dental Advisory Given  Plan Discussed with: Anesthesiologist, CRNA and Surgeon  Anesthesia Plan Comments:         Anesthesia Quick Evaluation

## 2016-06-05 NOTE — Discharge Instructions (Signed)
Eye Surgery Discharge Instructions  Expect mild scratchy sensation or mild soreness. DO NOT RUB YOUR EYE!  The day of surgery:  Minimal physical activity, but bed rest is not required  No reading, computer work, or close hand work  No bending, lifting, or straining.  May watch TV  For 24 hours:  No driving, legal decisions, or alcoholic beverages  Safety precautions  Eat anything you prefer: It is better to start with liquids, then soup then solid foods.  _____ Eye patch should be worn until postoperative exam tomorrow.  ____ Solar shield eyeglasses should be worn for comfort in the sunlight/patch while sleeping  Resume all regular medications including aspirin or Coumadin if these were discontinued prior to surgery. You may shower, bathe, shave, or wash your hair. Tylenol may be taken for mild discomfort.  Call your doctor if you experience significant pain, nausea, or vomiting, fever > 101 or other signs of infection. [phone] or [phone] Specific instructions:  Follow-up Information    Follow up with Tim Lair, MD On 06/06/2016.   Specialty:  Ophthalmology   Why:  9:00   Contact information:   [address] [place] [address] [phone]

## 2016-06-06 NOTE — Anesthesia Postprocedure Evaluation (Signed)
Anesthesia Post Note  Patient: Carrie Larsen  Procedure(s) Performed: Procedure(s) (LRB): CATARACT EXTRACTION PHACO AND INTRAOCULAR LENS PLACEMENT (IOC) (Right)  Patient location during evaluation: PACU Anesthesia Type: MAC Level of consciousness: awake and alert and oriented Pain management: pain level controlled Vital Signs Assessment: post-procedure vital signs reviewed and stable Respiratory status: spontaneous breathing Cardiovascular status: blood pressure returned to baseline Anesthetic complications: no    Last Vitals:  Filed Vitals:   06/05/16 1233 06/05/16 1241  BP: 139/74 130/88  Pulse: 70 76  Temp: 36.4 C   Resp: 16 16    Last Pain: There were no vitals filed for this visit.               Cleotilde Spadaccini

## 2016-08-07 ENCOUNTER — Emergency Department
Admission: EM | Admit: 2016-08-07 | Discharge: 2016-08-07 | Disposition: A | Discharge status: Home / Self Care | Payer: Medicare Other | Attending: Emergency Medicine | Admitting: Emergency Medicine

## 2016-08-07 DIAGNOSIS — Y92511 Restaurant or cafe as the place of occurrence of the external cause: Secondary | ICD-10-CM | POA: Diagnosis not present

## 2016-08-07 DIAGNOSIS — Y990 Civilian activity done for income or pay: Secondary | ICD-10-CM | POA: Diagnosis not present

## 2016-08-07 DIAGNOSIS — Z85038 Personal history of other malignant neoplasm of large intestine: Secondary | ICD-10-CM | POA: Insufficient documentation

## 2016-08-07 DIAGNOSIS — I10 Essential (primary) hypertension: Secondary | ICD-10-CM | POA: Insufficient documentation

## 2016-08-07 DIAGNOSIS — Y939 Activity, unspecified: Secondary | ICD-10-CM | POA: Insufficient documentation

## 2016-08-07 DIAGNOSIS — M25552 Pain in left hip: Secondary | ICD-10-CM | POA: Diagnosis present

## 2016-08-07 DIAGNOSIS — X501XXA Overexertion from prolonged static or awkward postures, initial encounter: Secondary | ICD-10-CM | POA: Insufficient documentation

## 2016-08-07 DIAGNOSIS — S76312A Strain of muscle, fascia and tendon of the posterior muscle group at thigh level, left thigh, initial encounter: Secondary | ICD-10-CM | POA: Diagnosis not present

## 2016-08-07 MED ORDER — CYCLOBENZAPRINE HCL 5 MG PO TABS: 5.0000 mg | ORAL_TABLET | Freq: Three times a day (TID) | ORAL | 0 refills | Status: DC | PRN

## 2016-08-07 NOTE — ED Notes (Signed)
Discharge instructions reviewed with patient. Questions fielded by this RN. Patient verbalizes understanding of instructions. Patient discharged home in stable condition per Bacon PA. No acute distress noted at time of discharge.   

## 2016-08-07 NOTE — ED Provider Notes (Signed)
[facility] Emergency Department Provider Note ____________________________________________  Time seen: 1950  I have reviewed the triage vital signs and the nursing notes.  HISTORY  Chief Complaint  Hip Pain  HPI Carrie Larsen is a 70 y.o. female presents to the ED for evaluation of pain to the left hip and buttocks about 30 minutes prior to arrival. 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 She describes tingling to her wall. She felt an immediate pop to the posterior left buttocks at the thigh. She describes pain that was severe in nature bringing tears to her eyes. She noted immediate pain and disability to the left lower leg. She denies any fall, head injury, distal paresthesias, or incontinence. Since that time she has tenderness to palpation to the right inferior buttocks. She also does some discomfort with engaging the hamstring muscles. She denies any previous back problems or history of muscle pains. She presents herself here for further evaluation.  Past Medical History:  Diagnosis Date  . Cancer [facility])    a. s/p partial left nephrectomy.  . Chest pain    a. 2012 ETT: no ischemia.  . Colon cancer ([place]) 1992   T3, N1, M0.  . Colon polyp 2011  . Essential hypertension   . GERD (gastroesophageal reflux disease)   . Hyperlipidemia   . Systolic murmur     Patient Active Problem List   Diagnosis Date Noted  . Left leg swelling 08/22/2015  . Systolic murmur   . Retrosternal chest pain 07/08/2015  . History of colonic polyps 04/16/2015  . Abdominal pain, left upper quadrant 05/11/2014  . Chest pain 03/08/2011  . Hyperlipidemia 03/08/2011  . HTN (hypertension) 03/08/2011    Past Surgical History:  Procedure Laterality Date  . ABDOMINAL HYSTERECTOMY    . APPENDECTOMY    . CATARACT EXTRACTION W/PHACO Left 04/24/2016   Procedure: CATARACT EXTRACTION PHACO AND INTRAOCULAR LENS PLACEMENT (IOC);  Surgeon: Birder Robson, MD;  Location: [facility] ORS;  Service: Ophthalmology;  Laterality: Left;  [place] 45.8 AP% 16.4 CDE 7.53 FLUID PACK LOT # [id number]  . CATARACT EXTRACTION W/PHACO Right 06/05/2016   Procedure: CATARACT EXTRACTION PHACO AND INTRAOCULAR LENS PLACEMENT (IOC);  Surgeon: Birder Robson, MD;  Location: [facility] ORS;  Service: Ophthalmology;  Laterality: Right;  [place] 25.9 AP% 21.9 CDE 5.68 Fluid pack lot # [id number]  . COLON RESECTION  1992  . COLON SURGERY    . COLONOSCOPY  03-14-10   Dr Bary Castilla, tubular adenoma at 25 cm.  . COLONOSCOPY WITH PROPOFOL N/A 06/07/2015   Procedure: COLONOSCOPY WITH PROPOFOL;  Surgeon: Robert Bellow, MD;  [facility] END[facility]COPY;  Service: Endoscopy;  Laterality: N/A;  . HERNIA REPAIR  [id number]   umbilical  . NEPHRECTOMY  1992   left  . RIB RESECTION     removal 4 ribs  . TONSILLECTOMY      Prior to Admission medications   Medication Sig Start Date End Date Taking? Authorizing Provider  Butalbital-APAP-Caffeine (CAPACET) 50-325-40 MG capsule Take 1 capsule by mouth every 4 (four) hours as needed for pain. Patient not taking: Reported on 05/01/2016 01/09/16   Harvest Dark, MD  cyclobenzaprine (FLEXERIL) 5 MG tablet Take 1 tablet (5 mg total) by mouth 3 (three) times daily as needed for muscle spasms. 08/07/16   Sergei Delo V Bacon Johanna Matto, PA-C  esomeprazole (NEXIUM) 40 MG capsule Take 40 mg by mouth daily before breakfast.      Historical Provider, MD  hydrochlorothiazide (HYDRODIURIL) 25 MG tablet Take 25 mg by mouth daily. 04/15/16   Historical Provider, MD  metoprolol (LOPRESSOR) 50 MG tablet Take 50 mg by mouth 2 (two) times daily.      Historical Provider, MD  Multiple Vitamins-Minerals (MULTIVITAMIN WITH MINERALS) tablet Take 1 tablet by mouth daily.    Historical Provider, MD  naproxen (NAPROSYN) 500 MG tablet Take 500 mg by mouth daily.    Historical Provider, MD  nortriptyline (PAMELOR) 10 MG capsule Take 10 mg by mouth daily.  05/05/15   Historical Provider, MD     Allergies Morphine and related  Family History  Problem Relation Age of Onset  . Colon cancer Mother   . Cerebral aneurysm Father     Social History Social History  Substance Use Topics  . Smoking status: Never Smoker  . Smokeless tobacco: Never Used  . Alcohol use No    Review of Systems  Constitutional: Negative for fever. Cardiovascular: Negative for chest pain. Respiratory: Negative for shortness of breath. Gastrointestinal: Negative for abdominal pain, vomiting and diarrhea. Genitourinary: Negative for dysuria. Musculoskeletal: Negative for back pain. Left posterior thigh and buttocks pain as above. Neurological: Negative for headaches, focal weakness or numbness. ____________________________________________  PHYSICAL EXAM:  VITAL SIGNS: ED Triage Vitals  Enc Vitals Group     BP 08/07/16 1854 (!) 144/78     Pulse --      Resp 08/07/16 1854 16     Temp 08/07/16 1854 98.2 F (36.8 C)     Temp Source 08/07/16 1854 Oral     SpO2 08/07/16 1854 96 %     Weight 08/07/16 1854 245 lb (111.1 kg)     Height 08/07/16 1854 5\' 10"  (1.778 m)     Head Circumference --      Peak Flow --      Pain Score 08/07/16 1855 10     Pain Loc --      Pain Edu? --      Excl. in [place]? --    Constitutional: Alert and oriented. Well appearing and in no distress. Head: Normocephalic and atraumatic. Cardiovascular: Normal Distal pulses and capillary refill.  Respiratory: Normal respiratory effort. No wheezes/rales/rhonchi. Gastrointestinal: Soft and nontender. No distention. Musculoskeletal: Patient with normal spinal element without midline tenderness, spasm, deformity, step-off. She is tender to palpation over the. Her left gluteal region at the hamstring origin. Her pain in that same region is aggravated by forceful flexion of the knee in the prone position. She also has some discomfort in the hamstring with a supine straight leg raise. Nontender with normal range of motion in all  extremities.  Neurologic: Cranial nerves II through XII grossly intact. Normal LE DTRs bilaterally.. Normal speech and language. No gross focal neurologic deficits are appreciated. Skin:  Skin is warm, dry and intact. No rash noted. ____________________________________________  INITIAL IMPRESSION / ASSESSMENT AND PLAN / ED COURSE  Patient with symptoms that appear to be consistent with a acute hamstring strain on the left. She is discharged with a prescription for Flexeril 5 mg dose primarily at bedtime. She'll continue with her daily naproxen as prescribed. She will follow-up with the primary care provider tomorrow as previously scheduled. She is also provided with a work note for out of work for 2 days as requested.  Clinical Course   ____________________________________________  FINAL CLINICAL IMPRESSION(S) / ED DIAGNOSES  Final diagnoses:  Hamstring strain, left, initial encounter      Melvenia Needles, PA-C 08/07/16 2027  Nena Polio, MD 08/08/16 [phone]

## 2016-08-07 NOTE — ED Triage Notes (Signed)
Pt c/o turning to the side and felt something pop In the left hip with pain in lower back about 61min pTA

## 2016-08-07 NOTE — Discharge Instructions (Signed)
Take your daily Naproxen as usual. Take 1/2 to a whole tablet of the muscle relaxant as needed for muscle pain. Take it primarily at bedtime. Apply ice compresses to the muscles as needed.

## 2016-08-08 DIAGNOSIS — S76319A Strain of muscle, fascia and tendon of the posterior muscle group at thigh level, unspecified thigh, initial encounter: Secondary | ICD-10-CM | POA: Insufficient documentation

## 2016-08-14 DIAGNOSIS — M25569 Pain in unspecified knee: Secondary | ICD-10-CM | POA: Insufficient documentation

## 2016-08-23 NOTE — Progress Notes (Signed)
This encounter was created in error - please disregard.

## 2016-10-03 ENCOUNTER — Ambulatory Visit (INDEPENDENT_AMBULATORY_CARE_PROVIDER_SITE_OTHER): Payer: Medicare Other | Admitting: Podiatry

## 2016-10-03 ENCOUNTER — Ambulatory Visit: Payer: Medicare Other

## 2016-10-03 DIAGNOSIS — Q828 Other specified congenital malformations of skin: Secondary | ICD-10-CM | POA: Diagnosis not present

## 2016-10-03 DIAGNOSIS — M722 Plantar fascial fibromatosis: Secondary | ICD-10-CM

## 2016-10-03 NOTE — Progress Notes (Signed)
She presents today states that she was going to come in for her heel pain but that all of a sudden went away so she came in for her porokeratosis.  Objective: Vital signs are stable alert and oriented 3. Pulses are palpable. Reactive hyperkeratosis plantar aspect of the fifth metatarsophalangeal joint left greater than right.  Assessment: Porokeratosis.  Plan: Debridement of porokeratosis left foot. Remember to thank her for the doughnut.

## 2016-10-22 ENCOUNTER — Emergency Department
Admission: EM | Admit: 2016-10-22 | Discharge: 2016-10-22 | Disposition: A | Discharge status: Home / Self Care | Payer: Medicare Other | Attending: Emergency Medicine | Admitting: Emergency Medicine

## 2016-10-22 DIAGNOSIS — R42 Dizziness and giddiness: Secondary | ICD-10-CM | POA: Diagnosis present

## 2016-10-22 DIAGNOSIS — I10 Essential (primary) hypertension: Secondary | ICD-10-CM | POA: Insufficient documentation

## 2016-10-22 DIAGNOSIS — Z85038 Personal history of other malignant neoplasm of large intestine: Secondary | ICD-10-CM | POA: Diagnosis not present

## 2016-10-22 DIAGNOSIS — Z91040 Latex allergy status: Secondary | ICD-10-CM | POA: Diagnosis not present

## 2016-10-22 DIAGNOSIS — Z85528 Personal history of other malignant neoplasm of kidney: Secondary | ICD-10-CM | POA: Diagnosis not present

## 2016-10-22 LAB — URINALYSIS COMPLETE WITH MICROSCOPIC (ARMC ONLY)
Bilirubin Urine: NEGATIVE
Glucose, UA: NEGATIVE mg/dL
Hgb urine dipstick: NEGATIVE
Ketones, ur: NEGATIVE mg/dL
Nitrite: NEGATIVE
Protein, ur: NEGATIVE mg/dL
Specific Gravity, Urine: 1.017 (ref 1.005–1.030)
pH: 6 (ref 5.0–8.0)

## 2016-10-22 LAB — BASIC METABOLIC PANEL
Anion gap: 7 (ref 5–15)
BUN: 19 mg/dL (ref 6–20)
CO2: 28 mmol/L (ref 22–32)
Calcium: 10.3 mg/dL (ref 8.9–10.3)
Chloride: 104 mmol/L (ref 101–111)
Creatinine, Ser: 0.84 mg/dL (ref 0.44–1.00)
GFR calc Af Amer: >60 mL/min (ref >60)
GFR calc non Af Amer: >60 mL/min (ref >60)
Glucose, Bld: 92 mg/dL (ref 65–99)
Potassium: 5 mmol/L (ref 3.5–5.1)
Sodium: 139 mmol/L (ref 135–145)

## 2016-10-22 LAB — CBC
HCT: 43.5 % (ref 35.0–47.0)
Hemoglobin: 14.8 g/dL (ref 12.0–16.0)
MCH: 29.6 pg (ref 26.0–34.0)
MCHC: 33.9 g/dL (ref 32.0–36.0)
MCV: 87.3 fL (ref 80.0–100.0)
Platelets: 310 10*3/uL (ref 150–440)
RBC: 4.98 MIL/uL (ref 3.80–5.20)
RDW: 14.1 % (ref 11.5–14.5)
WBC: 7.4 10*3/uL (ref 3.6–11.0)

## 2016-10-22 LAB — TSH: TSH: 2.592 u[IU]/mL (ref 0.350–4.500)

## 2016-10-22 NOTE — ED Notes (Signed)
Pt reports that for the past week she has been dizzy and that last night at work she "blacked out" for a few seconds - reports decreased energy - denies nausea/vomiting/diarrhea - denies headaches/blurred vision

## 2016-10-22 NOTE — Discharge Instructions (Signed)
Please seek medical attention for any high fevers, chest pain, shortness of breath, change in behavior, persistent vomiting, bloody stool or any other new or concerning symptoms.  

## 2016-10-22 NOTE — ED Triage Notes (Signed)
Pt c/o constant dizziness for the past week, denies N/V/visual changes or other sx.Marland Kitchen

## 2016-10-22 NOTE — ED Provider Notes (Signed)
[facility] Emergency Department Provider Note   ____________________________________________   I have reviewed the triage vital signs and the nursing notes.   HISTORY  Chief Complaint Dizziness   History limited by: Not Limited   HPI Carrie Larsen is a 70 y.o. female who presents to the emergency department today because of concerns for dizziness. She describes this as a sensation of being lightheaded. She states it is been going on for a few weeks. Last night however was worse where she started feeling like her vision was going out. It only lasts for a couple seconds and she was standing at the time. In addition the patient has noticed that she's been having more of a limp recently. She does state that she hurt her hamstring and [facility].She thinks that the lip might be coming from that.   Past Medical History:  Diagnosis Date  . Cancer [facility])    a. s/p partial left nephrectomy.  . Chest pain    a. 2012 ETT: no ischemia.  . Colon cancer ([place]) 1992   T3, N1, M0.  . Colon polyp 2011  . Essential hypertension   . GERD (gastroesophageal reflux disease)   . Hyperlipidemia   . Systolic murmur     Patient Active Problem List   Diagnosis Date Noted  . Left leg swelling 08/22/2015  . Systolic murmur   . Retrosternal chest pain 07/08/2015  . History of colonic polyps 04/16/2015  . Abdominal pain, left upper quadrant 05/11/2014  . Chest pain 03/08/2011  . Hyperlipidemia 03/08/2011  . HTN (hypertension) 03/08/2011    Past Surgical History:  Procedure Laterality Date  . ABDOMINAL HYSTERECTOMY    . APPENDECTOMY    . CATARACT EXTRACTION W/PHACO Left 04/24/2016   Procedure: CATARACT EXTRACTION PHACO AND INTRAOCULAR LENS PLACEMENT (IOC);  Surgeon: Birder Robson, MD;  Location: [facility] ORS;  Service: Ophthalmology;  Laterality: Left;  [place] 45.8 AP% 16.4 CDE 7.53 FLUID PACK LOT # [id number]  . CATARACT EXTRACTION W/PHACO Right  06/05/2016   Procedure: CATARACT EXTRACTION PHACO AND INTRAOCULAR LENS PLACEMENT (IOC);  Surgeon: Birder Robson, MD;  Location: [facility] ORS;  Service: Ophthalmology;  Laterality: Right;  [place] 25.9 AP% 21.9 CDE 5.68 Fluid pack lot # [id number]  . COLON RESECTION  1992  . COLON SURGERY    . COLONOSCOPY  03-14-10   Dr Bary Castilla, tubular adenoma at 25 cm.  . COLONOSCOPY WITH PROPOFOL N/A 06/07/2015   Procedure: COLONOSCOPY WITH PROPOFOL;  Surgeon: Robert Bellow, MD;  Location: [facility] ENDOSCOPY;  Service: Endoscopy;  Laterality: N/A;  . HERNIA REPAIR  [id number]   umbilical  . NEPHRECTOMY  1992   left  . RIB RESECTION     removal 4 ribs  . TONSILLECTOMY      Prior to Admission medications   Medication Sig Start Date End Date Taking? Authorizing Provider  Butalbital-APAP-Caffeine (CAPACET) 50-325-40 MG capsule Take 1 capsule by mouth every 4 (four) hours as needed for pain. 01/09/16   Harvest Dark, MD  cyclobenzaprine (FLEXERIL) 5 MG tablet Take 1 tablet (5 mg total) by mouth 3 (three) times daily as needed for muscle spasms. 08/07/16   Jenise V Bacon Menshew, PA-C  esomeprazole (NEXIUM) 40 MG capsule Take 40 mg by mouth daily before breakfast.      Historical Provider, MD  hydrochlorothiazide (HYDRODIURIL) 25 MG tablet Take 25 mg by mouth daily. 04/15/16   Historical Provider, MD  metoprolol (LOPRESSOR) 50 MG tablet Take 50 mg  by mouth 2 (two) times daily.      Historical Provider, MD  Multiple Vitamins-Minerals (MULTIVITAMIN WITH MINERALS) tablet Take 1 tablet by mouth daily.    Historical Provider, MD  naproxen (NAPROSYN) 500 MG tablet Take 500 mg by mouth daily.    Historical Provider, MD  nortriptyline (PAMELOR) 10 MG capsule Take 10 mg by mouth daily.  05/05/15   Historical Provider, MD    Allergies Morphine and related and Latex  Family History  Problem Relation Age of Onset  . Colon cancer Mother   . Cerebral aneurysm Father     Social History Social History  Substance Use Topics  .  Smoking status: Never Smoker  . Smokeless tobacco: Never Used  . Alcohol use No    Review of Systems  Constitutional: Negative for fever. Cardiovascular: Negative for chest pain. Respiratory: Negative for shortness of breath. Gastrointestinal: Negative for abdominal pain, vomiting and diarrhea. Genitourinary: Negative for dysuria. Musculoskeletal: Negative for back pain. Skin: Negative for rash. Neurological: Negative for headaches, focal weakness or numbness.  10-point ROS otherwise negative.  ____________________________________________   PHYSICAL EXAM:  VITAL SIGNS: ED Triage Vitals  Enc Vitals Group     BP 10/22/16 1547 (!) 164/83     Pulse Rate 10/22/16 1547 69     Resp 10/22/16 1547 18     Temp 10/22/16 1547 97.6 F (36.4 C)     Temp Source 10/22/16 1547 Oral     SpO2 10/22/16 1547 97 %     Weight 10/22/16 1548 250 lb (113.4 kg)     Height 10/22/16 1548 5\' 10"  (1.778 m)     Head Circumference --      Peak Flow --      Pain Score 10/22/16 1659 0   Constitutional: Alert and oriented. Well appearing and in no distress. Eyes: Conjunctivae are normal. Normal extraocular movements. ENT   Head: Normocephalic and atraumatic.   Nose: No congestion/rhinnorhea.   Mouth/Throat: Mucous membranes are moist.   Neck: No stridor. Hematological/Lymphatic/Immunilogical: No cervical lymphadenopathy. Cardiovascular: Normal rate, regular rhythm.  No murmurs, rubs, or gallops.  Respiratory: Normal respiratory effort without tachypnea nor retractions. Breath sounds are clear and equal bilaterally. No wheezes/rales/rhonchi. Gastrointestinal: Soft and nontender. No distention.  Genitourinary: Deferred Musculoskeletal: Normal range of motion in all extremities. No lower extremity edema. Neurologic:  Normal speech and language. No facial assymmetry. Finger to nose normal. Heel to shin normal. Romberg without concerning findings. Slightly limp on gait testing. No gross focal  neurologic deficits are appreciated.  Skin:  Skin is warm, dry and intact. No rash noted. Psychiatric: Mood and affect are normal. Speech and behavior are normal. Patient exhibits appropriate insight and judgment.  ____________________________________________    LABS (pertinent positives/negatives)  Labs Reviewed  URINALYSIS COMPLETEWITH MICROSCOPIC ([facility] ONLY) - Abnormal; Notable for the following:       Result Value   Color, Urine YELLOW (*)    APPearance CLEAR (*)    Leukocytes, UA 1+ (*)    Bacteria, UA RARE (*)    Squamous Epithelial / LPF 0-5 (*)    All other components within normal limits  BASIC METABOLIC PANEL  CBC  CBG MONITORING, ED     ____________________________________________   EKG  None  ____________________________________________    RADIOLOGY  None  ____________________________________________   PROCEDURES  Procedures  ____________________________________________   INITIAL IMPRESSION / ASSESSMENT AND PLAN / ED COURSE  Pertinent labs & imaging results that were available during my care of the  patient were reviewed by me and considered in my medical decision making (see chart for details).  Patient presented to the emergency department today because of concerns for dizziness. She also concern for some difficulty with walking. She does have a limp which might be related to recent hamstring injury. Finger to nose, heel to shin Romberg all within normal limits. No other cerebellar signs. Blood work without any concerning findings. At this point think patient is safe for discharge. Will give patient probably follow-up. Patient does have orthopedic follow-up. ____________________________________________   FINAL CLINICAL IMPRESSION(S) / ED DIAGNOSES  Final diagnoses:  Dizziness     Note: This dictation was prepared with Dragon dictation. Any transcriptional errors that result from this process are unintentional    Nance Pear,  MD 10/22/16 2019

## 2016-10-23 ENCOUNTER — Ambulatory Visit: Payer: Medicare Other | Admitting: Family Medicine

## 2017-01-02 ENCOUNTER — Encounter: Payer: Self-pay | Admitting: Podiatry

## 2017-01-02 ENCOUNTER — Ambulatory Visit (INDEPENDENT_AMBULATORY_CARE_PROVIDER_SITE_OTHER): Payer: Medicare Other | Admitting: Podiatry

## 2017-01-02 DIAGNOSIS — Q828 Other specified congenital malformations of skin: Secondary | ICD-10-CM

## 2017-01-02 NOTE — Progress Notes (Signed)
She presents today for follow-up of her porokeratosis plantar aspect left foot.  Pulses are palpable. No open lesions or wounds for keratoma sub-fifth metatarsal of the left foot was debrided and enucleated do not demonstrate any signs of infection.  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  Plan: Debridement of all reactive hyperkeratosis mechanically and chemical debridement with CellCept acid under occlusion to be left on for 2 days without getting this wet. She will notify me with any questions or concerns otherwise I will follow up with her in a few weeks.

## 2017-02-20 DIAGNOSIS — Z85528 Personal history of other malignant neoplasm of kidney: Secondary | ICD-10-CM | POA: Insufficient documentation

## 2017-02-20 DIAGNOSIS — N23 Unspecified renal colic: Secondary | ICD-10-CM | POA: Insufficient documentation

## 2017-02-20 DIAGNOSIS — N200 Calculus of kidney: Secondary | ICD-10-CM | POA: Insufficient documentation

## 2017-02-24 ENCOUNTER — Emergency Department
Admission: EM | Admit: 2017-02-24 | Discharge: 2017-02-24 | Disposition: A | Discharge status: Home / Self Care | Payer: Medicare Other | Attending: Emergency Medicine | Admitting: Emergency Medicine

## 2017-02-24 ENCOUNTER — Encounter: Payer: Self-pay | Admitting: Emergency Medicine

## 2017-02-24 DIAGNOSIS — Z5321 Procedure and treatment not carried out due to patient leaving prior to being seen by health care provider: Secondary | ICD-10-CM | POA: Diagnosis not present

## 2017-02-24 DIAGNOSIS — N390 Urinary tract infection, site not specified: Secondary | ICD-10-CM | POA: Insufficient documentation

## 2017-02-24 DIAGNOSIS — I10 Essential (primary) hypertension: Secondary | ICD-10-CM | POA: Insufficient documentation

## 2017-02-24 DIAGNOSIS — Z79899 Other long term (current) drug therapy: Secondary | ICD-10-CM | POA: Diagnosis not present

## 2017-02-24 DIAGNOSIS — Z85038 Personal history of other malignant neoplasm of large intestine: Secondary | ICD-10-CM | POA: Diagnosis not present

## 2017-02-24 LAB — URINALYSIS, COMPLETE (UACMP) WITH MICROSCOPIC
Bacteria, UA: NONE SEEN
Specific Gravity, Urine: 1.021 (ref 1.005–1.030)

## 2017-02-24 NOTE — ED Triage Notes (Signed)
Had [place] on tue, ct on Friday with no results showing either kidney stone or uti. Is on azo currently with no relief

## 2017-02-25 ENCOUNTER — Telehealth: Payer: Self-pay | Admitting: *Deleted

## 2017-02-25 NOTE — Telephone Encounter (Signed)
Per Dr. Jamal Collin, he received a call over the weekend regarding this patient.   She was seen by Dr. Jacqlyn Larsen and had a CT scan completed.   Patient also went to the E.R. but left without being seen after a reported 6 hour wait.   This patient was requesting an appointment with Dr. Bary Castilla for today.  I spoke with the patient this morning and made her aware that she will need to finish her evaluation with Dr. Jacqlyn Larsen per Dr. Bary Castilla.

## 2017-02-27 ENCOUNTER — Telehealth: Payer: Self-pay | Admitting: *Deleted

## 2017-02-27 NOTE — Telephone Encounter (Signed)
Message left on patient's cell to call the office.   We received records from [facility] today requesting an appointment for abdominal pain.   We need to find out if patient has completed evaluation with Dr. Jacqlyn Larsen as requested since phone call with the patient on Monday.

## 2017-02-28 ENCOUNTER — Encounter: Payer: Self-pay | Admitting: General Surgery

## 2017-02-28 ENCOUNTER — Ambulatory Visit (INDEPENDENT_AMBULATORY_CARE_PROVIDER_SITE_OTHER): Payer: Medicare Other | Admitting: General Surgery

## 2017-02-28 VITALS — BP 152/92 | HR 88 | Temp 94.8°F | Resp 16 | Ht 70.0 in | Wt 256.0 lb

## 2017-02-28 DIAGNOSIS — R109 Unspecified abdominal pain: Secondary | ICD-10-CM | POA: Insufficient documentation

## 2017-02-28 DIAGNOSIS — R1030 Lower abdominal pain, unspecified: Secondary | ICD-10-CM | POA: Diagnosis not present

## 2017-02-28 NOTE — Patient Instructions (Addendum)
The patient is aware to call back for any questions or concerns. Continue pain medications. Use heat to the area for comfort.

## 2017-02-28 NOTE — Progress Notes (Signed)
Patient ID: Carrie Larsen, female   DOB: 12-12-1945, 71 y.o.   MRN: [id number]  Chief Complaint  Patient presents with  . Abdominal Pain    HPI Carrie Larsen is a 71 y.o. female.  She states she has been having what she thought was a kidney stone for about 9 days and saw Dr Jacqlyn Larsen. The pain is central lower abdomen and is constant. She is having nausea but no vomiting. Bowels are moving daily but constipated feeling. CT abdomen at [facility] on 02-21-17 and an abdominal view 02-20-17. Right flank pain started after the abdominal pain started. She reports the pain is constant. She has been using Percocet for relief. She says the pain is cramping in nature.  HPI  Past Medical History:  Diagnosis Date  . Cancer [facility])    a. s/p partial left nephrectomy.  . Chest pain    a. 2012 ETT: no ischemia.  . Colon cancer ([place]) 1992   T3, N1, M0.  . Colon polyp 2011  . Essential hypertension   . GERD (gastroesophageal reflux disease)   . Hyperlipidemia   . Systolic murmur     Past Surgical History:  Procedure Laterality Date  . ABDOMINAL HYSTERECTOMY    . APPENDECTOMY    . CATARACT EXTRACTION W/PHACO Left 04/24/2016   Procedure: CATARACT EXTRACTION PHACO AND INTRAOCULAR LENS PLACEMENT (IOC);  Surgeon: Birder Robson, MD;  Location: [facility] ORS;  Service: Ophthalmology;  Laterality: Left;  [place] 45.8 AP% 16.4 CDE 7.53 FLUID PACK LOT # [id number]  . CATARACT EXTRACTION W/PHACO Right 06/05/2016   Procedure: CATARACT EXTRACTION PHACO AND INTRAOCULAR LENS PLACEMENT (IOC);  Surgeon: Birder Robson, MD;  Location: [facility] ORS;  Service: Ophthalmology;  Laterality: Right;  [place] 25.9 AP% 21.9 CDE 5.68 Fluid pack lot # [id number]  . COLON RESECTION  1992  . COLON SURGERY    . COLONOSCOPY  03-14-10   Dr Bary Castilla, tubular adenoma at 25 cm.  . COLONOSCOPY WITH PROPOFOL N/A 06/07/2015   Procedure: COLONOSCOPY WITH PROPOFOL;  Surgeon: Robert Bellow, MD;  Location: [facility] ENDOSCOPY;  Service: Endoscopy;  Laterality: N/A;  .  HERNIA REPAIR  [id number]   umbilical  . NEPHRECTOMY  1992   left  . RIB RESECTION     removal 4 ribs  . TONSILLECTOMY      Family History  Problem Relation Age of Onset  . Colon cancer Mother   . Cerebral aneurysm Father     Social History Social History  Substance Use Topics  . Smoking status: Never Smoker  . Smokeless tobacco: Never Used  . Alcohol use No    Allergies  Allergen Reactions  . Morphine And Related Nausea Only  . Latex Rash    Current Outpatient Prescriptions  Medication Sig Dispense Refill  . esomeprazole (NEXIUM) 40 MG capsule Take 40 mg by mouth daily before breakfast.      . hydrochlorothiazide (HYDRODIURIL) 25 MG tablet Take 25 mg by mouth daily.    . metoprolol (LOPRESSOR) 50 MG tablet Take 50 mg by mouth 2 (two) times daily.      . Multiple Vitamins-Minerals (MULTIVITAMIN WITH MINERALS) tablet Take 1 tablet by mouth daily.    . naproxen (NAPROSYN) 500 MG tablet Take 500 mg by mouth daily.    . nitrofurantoin (MACRODANTIN) 100 MG capsule Take 100 mg by mouth 2 (two) times daily.    . nortriptyline (PAMELOR) 10 MG capsule Take 10 mg by mouth daily.     Marland Kitchen oxyCODONE-acetaminophen (PERCOCET/ROXICET) 5-325 MG  tablet Take 5-325 tablets by mouth.     No current facility-administered medications for this visit.     Review of Systems Review of Systems  Constitutional: Negative.   Respiratory: Negative.   Cardiovascular: Negative.   Gastrointestinal: Positive for abdominal pain, constipation and nausea. Negative for diarrhea and vomiting.    Blood pressure (!) 152/92, pulse 88, temperature (!) 94.8 F (34.9 C), temperature source Oral, resp. rate 16, height 5\' 10"  (1.778 m), weight 256 lb (116.1 kg).  Physical Exam Physical Exam  Constitutional: She is oriented to person, place, and time. She appears well-developed and well-nourished.  Eyes: Conjunctivae are normal. No scleral icterus.  Neck: Neck supple.  Cardiovascular: Normal rate, regular rhythm  and normal heart sounds.   Pulmonary/Chest: Effort normal and breath sounds normal.  Abdominal: Soft. Normal appearance and bowel sounds are normal. There is no hepatosplenomegaly. No hernia.    Lymphadenopathy:    She has no cervical adenopathy.       Right: No inguinal adenopathy present.       Left: No inguinal adenopathy present.  Neurological: She is alert and oriented to person, place, and time.  Skin: Skin is warm and dry.    Data Reviewed Urology notes of based bar 02/20/2017 and emergency department record of 02/25/2017 reviewed.   CT scan of 02/21/2017 tailored to renal assessment as well as the CT scan of the abdomen and pelvis of 02/25/2017 were reviewed.  These films showed no acute changes. Chronic abdominal wall changes in the lower abdomen unchanged from review of the 2013 CT of the abdomen and pelvis completed [facility].  No evidence of ventral hernia. No evidence of intra-abdominal pathology.  Laboratory studies from the 02/25/2017 emergency room visit showed a hemoglobin of 14.8 with a white blood cell count of 7600. If the 4% polys, 33% lymphocytes. Platelet count 318,000. Comprehensive metabolic panel of the same date showed normal renal function and modest elevations of the serum transaminases and alkaline phosphatase. Normal lipase.  06/07/2015 colonoscopy was entirely normal.  Assessment    No clear etiology for the patient's reported severe pain in the abdominal wall panniculus.    Plan    No indication for surgical intervention at this time. The patient has been given Percocet by her urologist. She is been encouraged to use this if needed. She'll continue her naproxen. Local heat for comfort. Rest.       HPI, Physical Exam, Assessment and Plan have been scribed under the direction and in the presence of Robert Bellow, MD  Concepcion Living, LPN   Karie Fetch, RN  I have completed the exam and reviewed the above documentation for accuracy and  completeness.  I agree with the above.  Haematologist has been used and any errors in dictation or transcription are unintentional.  Hervey Ard, M.D., F.A.C.S.  Robert Bellow 02/28/2017, 8:22 PM

## 2017-03-20 ENCOUNTER — Ambulatory Visit: Payer: Medicare Other | Admitting: Internal Medicine

## 2017-04-03 ENCOUNTER — Encounter: Payer: Self-pay | Admitting: Podiatry

## 2017-04-03 ENCOUNTER — Ambulatory Visit (INDEPENDENT_AMBULATORY_CARE_PROVIDER_SITE_OTHER): Payer: Medicare Other | Admitting: Podiatry

## 2017-04-03 DIAGNOSIS — Q828 Other specified congenital malformations of skin: Secondary | ICD-10-CM

## 2017-04-03 NOTE — Progress Notes (Signed)
She resists today for follow-up of her painful callus to the plantar aspect of her left foot.  Objective: Vital signs extension alert and oriented 3 for keratoma sub-fifth metatarsal head of left foot.  Assessment: Porokeratosis of fifth left.  Plan: Debridement and chemical destruction of lesion today with salicylic acid EL3 days and then washed out thoroughly. She will notify us.

## 2017-05-06 ENCOUNTER — Ambulatory Visit: Payer: Medicare Other | Admitting: Podiatry

## 2017-05-08 ENCOUNTER — Ambulatory Visit (INDEPENDENT_AMBULATORY_CARE_PROVIDER_SITE_OTHER): Payer: Medicare Other | Admitting: Podiatry

## 2017-05-08 ENCOUNTER — Encounter: Payer: Self-pay | Admitting: Podiatry

## 2017-05-08 DIAGNOSIS — Q828 Other specified congenital malformations of skin: Secondary | ICD-10-CM | POA: Diagnosis not present

## 2017-05-08 NOTE — Progress Notes (Signed)
She presents today states that she has painful lesion sub-fifth met head left foot.  Objective: Pulses remain palpable no open lesions or wounds are noted. Small porokeratotic lesion sub-fifth met head left.  Assessment: Porokeratosis fifth met left.  Plan: Debridement of soft tissue today play salicylic acid under occlusion. She sleeve this on for 2 days then washed out thoroughly. She will not get it wet during that time.

## 2017-05-16 ENCOUNTER — Encounter (INDEPENDENT_AMBULATORY_CARE_PROVIDER_SITE_OTHER): Payer: Medicare Other | Admitting: Vascular Surgery

## 2017-05-27 ENCOUNTER — Other Ambulatory Visit: Payer: Self-pay | Admitting: Orthopedic Surgery

## 2017-05-27 DIAGNOSIS — M545 Low back pain: Secondary | ICD-10-CM

## 2017-06-04 ENCOUNTER — Ambulatory Visit: Payer: Medicare Other

## 2017-06-11 ENCOUNTER — Ambulatory Visit
Admission: RE | Admit: 2017-06-11 | Discharge: 2017-06-11 | Disposition: A | Discharge status: Home / Self Care | Payer: Medicare Other | Source: Ambulatory Visit | Attending: Orthopedic Surgery | Admitting: Orthopedic Surgery

## 2017-06-11 DIAGNOSIS — M4807 Spinal stenosis, lumbosacral region: Secondary | ICD-10-CM | POA: Diagnosis not present

## 2017-06-11 DIAGNOSIS — M545 Low back pain: Secondary | ICD-10-CM

## 2017-06-11 DIAGNOSIS — M5126 Other intervertebral disc displacement, lumbar region: Secondary | ICD-10-CM | POA: Insufficient documentation

## 2017-06-11 DIAGNOSIS — M48061 Spinal stenosis, lumbar region without neurogenic claudication: Secondary | ICD-10-CM | POA: Insufficient documentation

## 2017-06-26 ENCOUNTER — Inpatient Hospital Stay
Admission: EM | Admit: 2017-06-26 | Discharge: 2017-06-29 | DRG: 247 | Disposition: A | Discharge status: Home / Self Care | Payer: Medicare Other | Attending: Internal Medicine | Admitting: Internal Medicine

## 2017-06-26 ENCOUNTER — Emergency Department: Payer: Medicare Other

## 2017-06-26 DIAGNOSIS — Z905 Acquired absence of kidney: Secondary | ICD-10-CM

## 2017-06-26 DIAGNOSIS — Z9841 Cataract extraction status, right eye: Secondary | ICD-10-CM

## 2017-06-26 DIAGNOSIS — M5136 Other intervertebral disc degeneration, lumbar region: Secondary | ICD-10-CM | POA: Diagnosis present

## 2017-06-26 DIAGNOSIS — E669 Obesity, unspecified: Secondary | ICD-10-CM | POA: Diagnosis present

## 2017-06-26 DIAGNOSIS — I2510 Atherosclerotic heart disease of native coronary artery without angina pectoris: Secondary | ICD-10-CM | POA: Diagnosis present

## 2017-06-26 DIAGNOSIS — I10 Essential (primary) hypertension: Secondary | ICD-10-CM | POA: Diagnosis present

## 2017-06-26 DIAGNOSIS — E7800 Pure hypercholesterolemia, unspecified: Secondary | ICD-10-CM | POA: Diagnosis present

## 2017-06-26 DIAGNOSIS — Z961 Presence of intraocular lens: Secondary | ICD-10-CM | POA: Diagnosis present

## 2017-06-26 DIAGNOSIS — I214 Non-ST elevation (NSTEMI) myocardial infarction: Secondary | ICD-10-CM | POA: Diagnosis not present

## 2017-06-26 DIAGNOSIS — K219 Gastro-esophageal reflux disease without esophagitis: Secondary | ICD-10-CM | POA: Diagnosis present

## 2017-06-26 DIAGNOSIS — Z955 Presence of coronary angioplasty implant and graft: Secondary | ICD-10-CM

## 2017-06-26 DIAGNOSIS — Z85038 Personal history of other malignant neoplasm of large intestine: Secondary | ICD-10-CM

## 2017-06-26 DIAGNOSIS — Z6835 Body mass index (BMI) 35.0-35.9, adult: Secondary | ICD-10-CM

## 2017-06-26 DIAGNOSIS — Z79899 Other long term (current) drug therapy: Secondary | ICD-10-CM

## 2017-06-26 DIAGNOSIS — Z85528 Personal history of other malignant neoplasm of kidney: Secondary | ICD-10-CM

## 2017-06-26 DIAGNOSIS — R079 Chest pain, unspecified: Secondary | ICD-10-CM

## 2017-06-26 DIAGNOSIS — Z9842 Cataract extraction status, left eye: Secondary | ICD-10-CM

## 2017-06-26 DIAGNOSIS — Z23 Encounter for immunization: Secondary | ICD-10-CM

## 2017-06-26 HISTORY — DX: Spinal stenosis, lumbar region without neurogenic claudication: M48.061

## 2017-06-26 LAB — CBC WITH DIFFERENTIAL/PLATELET
Basophils Absolute: 0 10*3/uL (ref 0–0.1)
Basophils Relative: 0 %
Eosinophils Absolute: 0 10*3/uL (ref 0–0.7)
Eosinophils Relative: 0 %
HCT: 43.7 % (ref 35.0–47.0)
Hemoglobin: 14.6 g/dL (ref 12.0–16.0)
Lymphocytes Relative: 12 %
Lymphs Abs: 0.8 10*3/uL — ABNORMAL LOW (ref 1.0–3.6)
MCH: 29 pg (ref 26.0–34.0)
MCHC: 33.4 g/dL (ref 32.0–36.0)
MCV: 86.6 fL (ref 80.0–100.0)
Monocytes Absolute: 0 10*3/uL — ABNORMAL LOW (ref 0.2–0.9)
Monocytes Relative: 1 %
Neutro Abs: 6 10*3/uL (ref 1.4–6.5)
Neutrophils Relative %: 87 %
Platelets: 310 10*3/uL (ref 150–440)
RBC: 5.05 MIL/uL (ref 3.80–5.20)
RDW: 14 % (ref 11.5–14.5)
WBC: 6.9 10*3/uL (ref 3.6–11.0)

## 2017-06-26 LAB — TROPONIN I: Troponin I: <0.03 ng/mL (ref <0.03)

## 2017-06-26 LAB — BASIC METABOLIC PANEL
Anion gap: 11 (ref 5–15)
BUN: 28 mg/dL — ABNORMAL HIGH (ref 6–20)
CO2: 18 mmol/L — ABNORMAL LOW (ref 22–32)
Calcium: 9.7 mg/dL (ref 8.9–10.3)
Chloride: 112 mmol/L — ABNORMAL HIGH (ref 101–111)
Creatinine, Ser: 0.94 mg/dL (ref 0.44–1.00)
GFR calc Af Amer: >60 mL/min (ref >60)
GFR calc non Af Amer: 60 mL/min — ABNORMAL LOW (ref >60)
Glucose, Bld: 268 mg/dL — ABNORMAL HIGH (ref 65–99)
Potassium: 3.9 mmol/L (ref 3.5–5.1)
Sodium: 141 mmol/L (ref 135–145)

## 2017-06-26 MED ORDER — GI COCKTAIL ~~LOC~~
30.0000 mL | Freq: Once | ORAL | Status: AC
  Administered 2017-06-27: 30 mL via ORAL
  Filled 2017-06-26: qty 30

## 2017-06-26 MED ORDER — LIDOCAINE 5 % EX PTCH
MEDICATED_PATCH | CUTANEOUS | Status: AC
  Administered 2017-06-26: 1 via TRANSDERMAL
  Filled 2017-06-26: qty 1

## 2017-06-26 NOTE — ED Notes (Signed)
Patient transported to X-ray 

## 2017-06-26 NOTE — ED Triage Notes (Signed)
Per EMS, pt had pinched nerve surgery today at [facility] in [place]. Pt states she developed CP prior to arrival mainly between shoulders with numbness and tingling in mouth/jaw. Pt denies pain at surgery site. EMS reports HTN on arrival 178/118, pt given nitroglycerin and 324 aspirin. Pt A&O at this time and talking in complete sentences.

## 2017-06-27 ENCOUNTER — Emergency Department: Payer: Medicare Other

## 2017-06-27 ENCOUNTER — Encounter: Payer: Self-pay | Admitting: Radiology

## 2017-06-27 DIAGNOSIS — Z6835 Body mass index (BMI) 35.0-35.9, adult: Secondary | ICD-10-CM | POA: Diagnosis not present

## 2017-06-27 DIAGNOSIS — Z955 Presence of coronary angioplasty implant and graft: Secondary | ICD-10-CM | POA: Diagnosis not present

## 2017-06-27 DIAGNOSIS — E669 Obesity, unspecified: Secondary | ICD-10-CM | POA: Diagnosis present

## 2017-06-27 DIAGNOSIS — R079 Chest pain, unspecified: Secondary | ICD-10-CM | POA: Diagnosis present

## 2017-06-27 DIAGNOSIS — Z85038 Personal history of other malignant neoplasm of large intestine: Secondary | ICD-10-CM | POA: Diagnosis not present

## 2017-06-27 DIAGNOSIS — M5136 Other intervertebral disc degeneration, lumbar region: Secondary | ICD-10-CM | POA: Diagnosis present

## 2017-06-27 DIAGNOSIS — Z23 Encounter for immunization: Secondary | ICD-10-CM | POA: Diagnosis present

## 2017-06-27 DIAGNOSIS — I10 Essential (primary) hypertension: Secondary | ICD-10-CM | POA: Diagnosis present

## 2017-06-27 DIAGNOSIS — Z961 Presence of intraocular lens: Secondary | ICD-10-CM | POA: Diagnosis present

## 2017-06-27 DIAGNOSIS — K219 Gastro-esophageal reflux disease without esophagitis: Secondary | ICD-10-CM | POA: Diagnosis present

## 2017-06-27 DIAGNOSIS — Z9841 Cataract extraction status, right eye: Secondary | ICD-10-CM | POA: Diagnosis not present

## 2017-06-27 DIAGNOSIS — Z85528 Personal history of other malignant neoplasm of kidney: Secondary | ICD-10-CM | POA: Diagnosis not present

## 2017-06-27 DIAGNOSIS — I5032 Chronic diastolic (congestive) heart failure: Secondary | ICD-10-CM | POA: Diagnosis not present

## 2017-06-27 DIAGNOSIS — E782 Mixed hyperlipidemia: Secondary | ICD-10-CM | POA: Diagnosis not present

## 2017-06-27 DIAGNOSIS — Z9842 Cataract extraction status, left eye: Secondary | ICD-10-CM | POA: Diagnosis not present

## 2017-06-27 DIAGNOSIS — I214 Non-ST elevation (NSTEMI) myocardial infarction: Secondary | ICD-10-CM | POA: Diagnosis present

## 2017-06-27 DIAGNOSIS — I2510 Atherosclerotic heart disease of native coronary artery without angina pectoris: Secondary | ICD-10-CM | POA: Diagnosis present

## 2017-06-27 DIAGNOSIS — Z79899 Other long term (current) drug therapy: Secondary | ICD-10-CM | POA: Diagnosis not present

## 2017-06-27 DIAGNOSIS — E7800 Pure hypercholesterolemia, unspecified: Secondary | ICD-10-CM | POA: Diagnosis present

## 2017-06-27 DIAGNOSIS — Z905 Acquired absence of kidney: Secondary | ICD-10-CM | POA: Diagnosis not present

## 2017-06-27 LAB — BASIC METABOLIC PANEL
Anion gap: 9 (ref 5–15)
BUN: 25 mg/dL — ABNORMAL HIGH (ref 6–20)
CO2: 24 mmol/L (ref 22–32)
Calcium: 10.1 mg/dL (ref 8.9–10.3)
Chloride: 107 mmol/L (ref 101–111)
Creatinine, Ser: 0.9 mg/dL (ref 0.44–1.00)
GFR calc Af Amer: >60 mL/min (ref >60)
GFR calc non Af Amer: >60 mL/min (ref >60)
Glucose, Bld: 129 mg/dL — ABNORMAL HIGH (ref 65–99)
Potassium: 4.3 mmol/L (ref 3.5–5.1)
Sodium: 140 mmol/L (ref 135–145)

## 2017-06-27 LAB — HEPARIN LEVEL (UNFRACTIONATED)
Heparin Unfractionated: 0.15 IU/mL — ABNORMAL LOW (ref 0.30–0.70)
Heparin Unfractionated: 0.5 IU/mL (ref 0.30–0.70)

## 2017-06-27 LAB — LIPID PANEL
Cholesterol: 324 mg/dL — ABNORMAL HIGH (ref 0–200)
HDL: 54 mg/dL (ref >40)
LDL Cholesterol: 239 mg/dL — ABNORMAL HIGH (ref 0–99)
Total CHOL/HDL Ratio: 6 RATIO
Triglycerides: 154 mg/dL — ABNORMAL HIGH (ref <150)
VLDL: 31 mg/dL (ref 0–40)

## 2017-06-27 LAB — FIBRIN DERIVATIVES D-DIMER (ARMC ONLY): Fibrin derivatives D-dimer (ARMC): 495.22 (ref 0.00–499.00)

## 2017-06-27 LAB — TROPONIN I
Troponin I: 0.13 ng/mL (ref <0.03)
Troponin I: 0.72 ng/mL (ref <0.03)
Troponin I: 0.72 ng/mL (ref <0.03)
Troponin I: 0.88 ng/mL (ref <0.03)

## 2017-06-27 LAB — PROTIME-INR
INR: 0.97
Prothrombin Time: 12.9 seconds (ref 11.4–15.2)

## 2017-06-27 LAB — APTT: aPTT: 24 seconds (ref 24–36)

## 2017-06-27 MED ORDER — ASPIRIN 300 MG RE SUPP: 300.0000 mg | RECTAL | Status: AC

## 2017-06-27 MED ORDER — CARVEDILOL 12.5 MG PO TABS
12.5000 mg | ORAL_TABLET | Freq: Two times a day (BID) | ORAL | Status: DC
  Administered 2017-06-27 – 2017-06-29 (×4): 12.5 mg via ORAL
  Filled 2017-06-27 (×4): qty 1

## 2017-06-27 MED ORDER — PANTOPRAZOLE SODIUM 40 MG PO TBEC
40.0000 mg | DELAYED_RELEASE_TABLET | Freq: Every day | ORAL | Status: DC
  Administered 2017-06-27 – 2017-06-29 (×2): 40 mg via ORAL
  Filled 2017-06-27 (×2): qty 1

## 2017-06-27 MED ORDER — HEPARIN (PORCINE) IN NACL 100-0.45 UNIT/ML-% IJ SOLN
1350.0000 [IU]/h | INTRAMUSCULAR | Status: DC
  Administered 2017-06-27: 1000 [IU]/h via INTRAVENOUS
  Administered 2017-06-27: 1350 [IU]/h via INTRAVENOUS
  Filled 2017-06-27 (×2): qty 250

## 2017-06-27 MED ORDER — IOPAMIDOL (ISOVUE-370) INJECTION 76%
75.0000 mL | Freq: Once | INTRAVENOUS | Status: AC | PRN
  Administered 2017-06-27: 75 mL via INTRAVENOUS

## 2017-06-27 MED ORDER — HEPARIN BOLUS VIA INFUSION
4000.0000 [IU] | Freq: Once | INTRAVENOUS | Status: AC
  Administered 2017-06-27: 4000 [IU] via INTRAVENOUS
  Filled 2017-06-27: qty 4000

## 2017-06-27 MED ORDER — PNEUMOCOCCAL VAC POLYVALENT 25 MCG/0.5ML IJ INJ
0.5000 mL | INJECTION | INTRAMUSCULAR | Status: AC
  Administered 2017-06-29: 0.5 mL via INTRAMUSCULAR
  Filled 2017-06-27: qty 0.5

## 2017-06-27 MED ORDER — NITROGLYCERIN 0.4 MG SL SUBL: 0.4000 mg | SUBLINGUAL_TABLET | SUBLINGUAL | Status: DC | PRN

## 2017-06-27 MED ORDER — SODIUM CHLORIDE 0.9 % WEIGHT BASED INFUSION: 1.0000 mL/kg/h | INTRAVENOUS | Status: DC

## 2017-06-27 MED ORDER — SODIUM CHLORIDE 0.9 % WEIGHT BASED INFUSION
3.0000 mL/kg/h | INTRAVENOUS | Status: AC
  Administered 2017-06-28: 3 mL/kg/h via INTRAVENOUS

## 2017-06-27 MED ORDER — HEPARIN BOLUS VIA INFUSION
2800.0000 [IU] | Freq: Once | INTRAVENOUS | Status: AC
  Administered 2017-06-27: 2800 [IU] via INTRAVENOUS
  Filled 2017-06-27: qty 2800

## 2017-06-27 MED ORDER — ASPIRIN 81 MG PO CHEW
324.0000 mg | CHEWABLE_TABLET | ORAL | Status: AC
  Administered 2017-06-27: 324 mg via ORAL
  Filled 2017-06-27: qty 4

## 2017-06-27 MED ORDER — ATORVASTATIN CALCIUM 20 MG PO TABS
80.0000 mg | ORAL_TABLET | Freq: Every day | ORAL | Status: DC
  Administered 2017-06-27 – 2017-06-29 (×3): 80 mg via ORAL
  Filled 2017-06-27 (×3): qty 4

## 2017-06-27 MED ORDER — SODIUM CHLORIDE 0.9 % IV SOLN: 250.0000 mL | INTRAVENOUS | Status: DC | PRN

## 2017-06-27 MED ORDER — LIDOCAINE 5 % EX PTCH
1.0000 | MEDICATED_PATCH | CUTANEOUS | Status: DC
  Administered 2017-06-26: 1 via TRANSDERMAL
  Filled 2017-06-27 (×2): qty 1

## 2017-06-27 MED ORDER — ACETAMINOPHEN 325 MG PO TABS
650.0000 mg | ORAL_TABLET | ORAL | Status: DC | PRN
  Administered 2017-06-29: 650 mg via ORAL

## 2017-06-27 MED ORDER — ASPIRIN EC 81 MG PO TBEC
81.0000 mg | DELAYED_RELEASE_TABLET | Freq: Every day | ORAL | Status: DC
  Administered 2017-06-29: 81 mg via ORAL
  Filled 2017-06-27: qty 1

## 2017-06-27 MED ORDER — NORTRIPTYLINE HCL 10 MG PO CAPS
10.0000 mg | ORAL_CAPSULE | Freq: Every day | ORAL | Status: DC
  Administered 2017-06-27 – 2017-06-29 (×2): 10 mg via ORAL
  Filled 2017-06-27 (×3): qty 1

## 2017-06-27 MED ORDER — METOPROLOL TARTRATE 50 MG PO TABS
50.0000 mg | ORAL_TABLET | Freq: Two times a day (BID) | ORAL | Status: DC
  Administered 2017-06-27: 50 mg via ORAL
  Filled 2017-06-27: qty 1

## 2017-06-27 MED ORDER — LISINOPRIL 10 MG PO TABS: 10.0000 mg | ORAL_TABLET | Freq: Every day | ORAL | Status: DC

## 2017-06-27 MED ORDER — ONDANSETRON HCL 4 MG/2ML IJ SOLN: 4.0000 mg | Freq: Four times a day (QID) | INTRAMUSCULAR | Status: DC | PRN

## 2017-06-27 MED ORDER — LEVOFLOXACIN IN D5W 750 MG/150ML IV SOLN
750.0000 mg | Freq: Once | INTRAVENOUS | Status: AC
Start: 2017-06-27 — End: 2017-06-27
  Administered 2017-06-27: 750 mg via INTRAVENOUS
  Filled 2017-06-27: qty 150

## 2017-06-27 MED ORDER — ASPIRIN 81 MG PO CHEW
81.0000 mg | CHEWABLE_TABLET | ORAL | Status: AC
  Administered 2017-06-28: 81 mg via ORAL
  Filled 2017-06-27: qty 1

## 2017-06-27 NOTE — Progress Notes (Signed)
ANTICOAGULATION CONSULT NOTE - Initial Consult  Pharmacy Consult for heparin  Indication: chest pain/ACS  Allergies  Allergen Reactions  . Morphine And Related Nausea Only  . Latex Rash    Patient Measurements: Height: 5\' 10"  (177.8 cm) Weight: 250 lb (113.4 kg) IBW/kg (Calculated) : 68.5 Heparin Dosing Weight: 94 kg  Vital Signs: Temp: 97.8 F (36.6 C) (08/02 0414) Temp Source: Oral (08/02 0414) BP: 178/101 (08/02 0414) Pulse Rate: 111 (08/02 0414)  Labs:  Recent Labs  06/26/17 2150 06/27/17 0014  HGB 14.6  --   HCT 43.7  --   PLT 310  --   CREATININE 0.94  --   TROPONINI <0.03 0.13*    Estimated Creatinine Clearance: 75 mL/min (by C-G formula based on SCr of 0.94 mg/dL).   Medical History: Past Medical History:  Diagnosis Date  . Cancer [facility])    a. s/p partial left nephrectomy.  . Chest pain    a. 2012 ETT: no ischemia.  . Colon cancer ([place]) 1992   T3, N1, M0.  . Colon polyp 2011  . Essential hypertension   . GERD (gastroesophageal reflux disease)   . Hyperlipidemia   . Systolic murmur     Medications:  Scheduled:  . aspirin  324 mg Oral NOW   Or  . aspirin  300 mg Rectal NOW  . [START ON 06/28/2017] aspirin EC  81 mg Oral Daily  . heparin  4,000 Units Intravenous Once  . lidocaine  1 patch Transdermal Q24H  . metoprolol tartrate  50 mg Oral BID  . nortriptyline  10 mg Oral Daily  . pantoprazole  40 mg Oral Daily    Assessment: Patient admitted for CP w/ trops 0.13. Being started on heparin drip.  Goal of Therapy:  Heparin level 0.3-0.7 units/ml Monitor platelets by anticoagulation protocol: Yes   Plan:  Give 4000 units bolus x 1  Will start heparin drip @ 1000 units/hr. Will order HL @ 1300. Baseline labs ordered.  Tobie Lords, PharmD, BCPS Clinical Pharmacist 06/27/2017

## 2017-06-27 NOTE — Plan of Care (Signed)
Problem: Safety: Goal: Ability to remain free from injury will improve Outcome: Progressing Fall precautions in place, non skid socks when oob  Problem: Pain Managment: Goal: General experience of comfort will improve Outcome: Progressing Prn medications  Problem: Tissue Perfusion: Goal: Risk factors for ineffective tissue perfusion will decrease Outcome: Progressing Heparin gtt

## 2017-06-27 NOTE — ED Notes (Signed)
Chaplain paged again to let them know pt will be going to inpatient room, chaplain has not returned page at this time.

## 2017-06-27 NOTE — ED Notes (Signed)
Patient transported to CT 

## 2017-06-27 NOTE — ED Notes (Signed)
ED Provider at bedside. 

## 2017-06-27 NOTE — ED Provider Notes (Signed)
[facility] Emergency Department Provider Note   ____________________________________________   I have reviewed the triage vital signs and the nursing notes.   HISTORY  Chief Complaint Chest Pain   History limited by: Not Limited   HPI Carrie Larsen is a 71 y.o. female who presents to the emergency department today because of concerns for chest pain. The patient states that she underwent an epidural for spinal problems earlier today during. A few hours after returning home she started developing chest pain appears located center chest. She described it as tightness. She also had some back pain with this. Patient denies any shortness of breath. She denies any diaphoresis. Denies similar symptoms in the past. No nausea or vomiting.   Past Medical History:  Diagnosis Date  . Cancer [facility])    a. s/p partial left nephrectomy.  . Chest pain    a. 2012 ETT: no ischemia.  . Colon cancer ([place]) 1992   T3, N1, M0.  . Colon polyp 2011  . Essential hypertension   . GERD (gastroesophageal reflux disease)   . Hyperlipidemia   . Systolic murmur     Patient Active Problem List   Diagnosis Date Noted  . Abdominal pain 02/28/2017  . Left leg swelling 08/22/2015  . Systolic murmur   . Retrosternal chest pain 07/08/2015  . History of colonic polyps 04/16/2015  . Chest pain 03/08/2011  . Hyperlipidemia 03/08/2011  . HTN (hypertension) 03/08/2011    Past Surgical History:  Procedure Laterality Date  . ABDOMINAL HYSTERECTOMY    . APPENDECTOMY    . BACK SURGERY    . CATARACT EXTRACTION W/PHACO Left 04/24/2016   Procedure: CATARACT EXTRACTION PHACO AND INTRAOCULAR LENS PLACEMENT (IOC);  Surgeon: Birder Robson, MD;  Location: [facility] ORS;  Service: Ophthalmology;  Laterality: Left;  [place] 45.8 AP% 16.4 CDE 7.53 FLUID PACK LOT # [id number]  . CATARACT EXTRACTION W/PHACO Right 06/05/2016   Procedure: CATARACT EXTRACTION PHACO AND INTRAOCULAR LENS PLACEMENT (IOC);   Surgeon: Birder Robson, MD;  Location: [facility] ORS;  Service: Ophthalmology;  Laterality: Right;  [place] 25.9 AP% 21.9 CDE 5.68 Fluid pack lot # [id number]  . COLON RESECTION  1992  . COLON SURGERY    . COLONOSCOPY  03-14-10   Dr Bary Castilla, tubular adenoma at 25 cm.  . COLONOSCOPY WITH PROPOFOL N/A 06/07/2015   Procedure: COLONOSCOPY WITH PROPOFOL;  Surgeon: Robert Bellow, MD;  Location: [facility] ENDOSCOPY;  Service: Endoscopy;  Laterality: N/A;  . HERNIA REPAIR  [id number]   umbilical  . NEPHRECTOMY  1992   left  . RIB RESECTION     removal 4 ribs  . TONSILLECTOMY      Prior to Admission medications   Medication Sig Start Date End Date Taking? Authorizing Provider  esomeprazole (NEXIUM) 40 MG capsule Take 40 mg by mouth daily before breakfast.      [provider]  hydrochlorothiazide (HYDRODIURIL) 25 MG tablet Take 25 mg by mouth daily. 04/15/16   [provider]  metoprolol (LOPRESSOR) 50 MG tablet Take 50 mg by mouth 2 (two) times daily.      [provider]  Multiple Vitamins-Minerals (MULTIVITAMIN WITH MINERALS) tablet Take 1 tablet by mouth daily.    [provider]  naproxen (NAPROSYN) 500 MG tablet Take 500 mg by mouth daily.    [provider]  nitrofurantoin (MACRODANTIN) 100 MG capsule Take 100 mg by mouth 2 (two) times daily.    [provider]  nortriptyline (PAMELOR) 10 MG capsule  Take 10 mg by mouth daily.  05/05/15   [provider]  oxyCODONE-acetaminophen (PERCOCET/ROXICET) 5-325 MG tablet Take 5-325 tablets by mouth. 02/21/17   [provider]    Allergies Morphine and related and Latex  Family History  Problem Relation Age of Onset  . Colon cancer Mother   . Cerebral aneurysm Father     Social History Social History  Substance Use Topics  . Smoking status: Never Smoker  . Smokeless tobacco: Never Used  . Alcohol use No    Review of Systems Constitutional: No fever/chills Eyes: No visual  changes. ENT: No sore throat. Cardiovascular: Positive for chest pain. Respiratory: Denies shortness of breath. Gastrointestinal: No abdominal pain.  No nausea, no vomiting.  No diarrhea.   Genitourinary: Negative for dysuria. Musculoskeletal: Negative for back pain. Skin: Negative for rash. Neurological: Negative for headaches, focal weakness or numbness.  ____________________________________________   PHYSICAL EXAM:  VITAL SIGNS: ED Triage Vitals  Enc Vitals Group     BP 06/26/17 2148 (!) 180/93     Pulse Rate 06/26/17 2148 (!) 111     Resp 06/26/17 2148 20     Temp 06/26/17 2148 97.6 F (36.4 C)     Temp Source 06/26/17 2148 Oral     SpO2 06/26/17 2148 96 %     Weight 06/26/17 2149 250 lb (113.4 kg)     Height 06/26/17 2149 5\' 10"  (1.778 m)     Head Circumference --      Peak Flow --      Pain Score 06/26/17 2147 5   Constitutional: Alert and oriented. Well appearing and in no distress. Eyes: Conjunctivae are normal.  ENT   Head: Normocephalic and atraumatic.   Nose: No congestion/rhinnorhea.   Mouth/Throat: Mucous membranes are moist.   Neck: No stridor. Hematological/Lymphatic/Immunilogical: No cervical lymphadenopathy. Cardiovascular: Normal rate, regular rhythm.  No murmurs, rubs, or gallops.  Respiratory: Normal respiratory effort without tachypnea nor retractions. Breath sounds are clear and equal bilaterally. No wheezes/rales/rhonchi. Gastrointestinal: Soft and non tender. No rebound. No guarding.  Genitourinary: Deferred Musculoskeletal: Normal range of motion in all extremities. No lower extremity edema. Neurologic:  Normal speech and language. No gross focal neurologic deficits are appreciated.  Skin:  Skin is warm, dry and intact. No rash noted. Psychiatric: Mood and affect are normal. Speech and behavior are normal. Patient exhibits appropriate insight and judgment.  ____________________________________________    LABS (pertinent  positives/negatives)  Labs Reviewed  CBC WITH DIFFERENTIAL/PLATELET - Abnormal; Notable for the following:       Result Value   Lymphs Abs 0.8 (*)    Monocytes Absolute 0.0 (*)    All other components within normal limits  BASIC METABOLIC PANEL - Abnormal; Notable for the following:    Chloride 112 (*)    CO2 18 (*)    Glucose, Bld 268 (*)    BUN 28 (*)    GFR calc non Af Amer 60 (*)    All other components within normal limits  TROPONIN I  TROPONIN I     ____________________________________________   EKG  I, Nance Pear, attending physician, personally viewed and interpreted this EKG  EKG Time: 2149 Rate: 108 Rhythm: sinus tachycardia Axis: normal Intervals: qtc 486 QRS: RBBB ST changes: no st elevation Impression: abnormal ekg   ____________________________________________    RADIOLOGY  CXR IMPRESSION:  Left lung base atelectasis/ scarring versus infiltrate. Clinical  correlation is recommended.      ___________________________________________   PROCEDURES  Procedures  ____________________________________________   INITIAL IMPRESSION / ASSESSMENT AND PLAN / ED COURSE  Pertinent labs & imaging results that were available during my care of the patient were reviewed by me and considered in my medical decision making (see chart for details).  Patient presents to the emergency department today because of concerns for chest pain. Patient did have an epidural earlier today. She arrives his tetanus. Initial troponin negative. EKG does show a new right bundle branch block. Will plan on getting the d-dimer. Will repeat troponin.  ____________________________________________   FINAL CLINICAL IMPRESSION(S) / ED DIAGNOSES  Chest pain  Note: This dictation was prepared with Dragon dictation. Any transcriptional errors that result from this process are unintentional     Nance Pear, MD 06/27/17 [phone]

## 2017-06-27 NOTE — Progress Notes (Signed)
Carrie Larsen, Carrie Larsen 71 year old female. Patient was transferred from ED via a wheelchair after c/o acute chest pain. On admission patient was A&O X4, ambulatory, denied being in any discomfort. Patient was oriented to the unit and her room and care plan discussed with patient. VS obtained and cardiac monitor applied. Patient remained NSR with VS WDL.

## 2017-06-27 NOTE — ED Notes (Signed)
Pt reports lower back pain from sitting in bed, pt offered lounge chair which pt states she would like to try. Pt placed in lounge chair for comfort. Pt ambulated with stand by assist to chair. Pt sitting in chair in NAD at this time.

## 2017-06-27 NOTE — Progress Notes (Signed)
Inpatient Diabetes Program Recommendations  AACE/ADA: New Consensus Statement on Inpatient Glycemic Control (2015)  Target Ranges:  Prepandial:   less than 140 mg/dL      Peak postprandial:   less than 180 mg/dL (1-2 hours)      Critically ill patients:  140 - 180 mg/dL   No results found for: GLUCAP, HGBA1C  Review of Glycemic ControlResults for Carrie Larsen, Carrie Larsen (MRN [id number]) as of 06/27/2017 10:30  Ref. Range 06/26/2017 21:50 06/27/2017 04:50  Glucose Latest Ref Range: 65 - 99 mg/dL 268 (H) 129 (H)   Diabetes history: None Outpatient Diabetes medications: None Current orders for Inpatient glycemic control:  None Inpatient Diabetes Program Recommendations:   Note that lab glucose elevated last PM on admit.  May consider checking A1C to determine pre hospitalization glycemic control. Also may consider adding Novolog sensitive correction tid with meals and HS.  Text page sent.   Thanks, Adah Perl, RN, BC-ADM Inpatient Diabetes Coordinator Pager [phone]

## 2017-06-27 NOTE — ED Notes (Signed)
Pt requested to speak with chaplain after speaking with EDP and results, therapeutic conversation given to pt and chaplain paged. Chaplain states they will see pt in the ED soon.

## 2017-06-27 NOTE — H&P (Signed)
[facility] [place] NAME: Carrie Larsen    MR#:  [id number]  DATE OF BIRTH:  1946-06-18  DATE OF ADMISSION:  06/26/2017  PRIMARY CARE PHYSICIAN: Lavonne Chick, MD   REQUESTING/REFERRING PHYSICIAN:   CHIEF COMPLAINT:   Chief Complaint  Patient presents with  . Chest Pain    HISTORY OF PRESENT ILLNESS: Carrie Larsen  is a 71 y.o. female with a known history of kidney cancer, colon cancer, hypertension, GERD, hyperlipidemia presented to the emergency room with chest pain. Chest pain is located retrosternally is sharp in nature 5 out of 10 on a scale of 1-10. Patient had epidural injection yesterday for back pain. And after the procedure she went home and noticed chest tightness and increased back pain and the epidural site. EKG showed normal sinus rhythm with no ST segment changes. A set of troponin was negative but second set was elevated. Hospitalist service was consulted. Patient was worked up with CT chest which showed no pulmonary embolism.  PAST MEDICAL HISTORY:   Past Medical History:  Diagnosis Date  . Cancer [facility])    a. s/p partial left nephrectomy.  . Chest pain    a. 2012 ETT: no ischemia.  . Colon cancer ([place]) 1992   T3, N1, M0.  . Colon polyp 2011  . Essential hypertension   . GERD (gastroesophageal reflux disease)   . Hyperlipidemia   . Systolic murmur     PAST SURGICAL HISTORY: Past Surgical History:  Procedure Laterality Date  . ABDOMINAL HYSTERECTOMY    . APPENDECTOMY    . BACK SURGERY    . CATARACT EXTRACTION W/PHACO Left 04/24/2016   Procedure: CATARACT EXTRACTION PHACO AND INTRAOCULAR LENS PLACEMENT (IOC);  Surgeon: Birder Robson, MD;  Location: [facility] ORS;  Service: Ophthalmology;  Laterality: Left;  [place] 45.8 AP% 16.4 CDE 7.53 FLUID PACK LOT # [id number]  . CATARACT EXTRACTION W/PHACO Right 06/05/2016   Procedure: CATARACT EXTRACTION PHACO AND INTRAOCULAR LENS PLACEMENT (IOC);  Surgeon: Birder Robson, MD;   Location: [facility] ORS;  Service: Ophthalmology;  Laterality: Right;  [place] 25.9 AP% 21.9 CDE 5.68 Fluid pack lot # [id number]  . COLON RESECTION  1992  . COLON SURGERY    . COLONOSCOPY  03-14-10   Dr Bary Castilla, tubular adenoma at 25 cm.  . COLONOSCOPY WITH PROPOFOL N/A 06/07/2015   Procedure: COLONOSCOPY WITH PROPOFOL;  Surgeon: Robert Bellow, MD;  Location: [facility] ENDOSCOPY;  Service: Endoscopy;  Laterality: N/A;  . HERNIA REPAIR  2330   umbilical  . NEPHRECTOMY  1992   left  . RIB RESECTION     removal 4 ribs  . TONSILLECTOMY      SOCIAL HISTORY:  Social History  Substance Use Topics  . Smoking status: Never Smoker  . Smokeless tobacco: Never Used  . Alcohol use No    FAMILY HISTORY:  Family History  Problem Relation Age of Onset  . Colon cancer Mother   . Cerebral aneurysm Father     DRUG ALLERGIES:  Allergies  Allergen Reactions  . Morphine And Related Nausea Only  . Latex Rash    REVIEW OF SYSTEMS:   CONSTITUTIONAL: No fever, fatigue or weakness.  EYES: No blurred or double vision.  EARS, NOSE, AND THROAT: No tinnitus or ear pain.  RESPIRATORY: No cough, shortness of breath, wheezing or hemoptysis.  CARDIOVASCULAR: Has chest pain, No orthopnea, edema.  GASTROINTESTINAL: No nausea, vomiting, diarrhea or abdominal pain.  GENITOURINARY: No dysuria, hematuria.  ENDOCRINE: No polyuria, nocturia,  HEMATOLOGY: No anemia, easy bruising or bleeding SKIN: No rash or lesion. MUSCULOSKELETAL: No joint pain or arthritis.   Has back pain. NEUROLOGIC: No tingling, numbness, weakness.  PSYCHIATRY: No anxiety or depression.   MEDICATIONS AT HOME:  Prior to Admission medications   Medication Sig Start Date End Date Taking? Authorizing Provider  esomeprazole (NEXIUM) 40 MG capsule Take 40 mg by mouth daily before breakfast.      [provider]  hydrochlorothiazide (HYDRODIURIL) 25 MG tablet Take 25 mg by mouth daily. 04/15/16   [provider]  metoprolol  (LOPRESSOR) 50 MG tablet Take 50 mg by mouth 2 (two) times daily.      [provider]  Multiple Vitamins-Minerals (MULTIVITAMIN WITH MINERALS) tablet Take 1 tablet by mouth daily.    [provider]  naproxen (NAPROSYN) 500 MG tablet Take 500 mg by mouth daily.    [provider]  nitrofurantoin (MACRODANTIN) 100 MG capsule Take 100 mg by mouth 2 (two) times daily.    [provider]  nortriptyline (PAMELOR) 10 MG capsule Take 10 mg by mouth daily.  05/05/15   [provider]  oxyCODONE-acetaminophen (PERCOCET/ROXICET) 5-325 MG tablet Take 5-325 tablets by mouth. 02/21/17   [provider]      PHYSICAL EXAMINATION:   VITAL SIGNS: Blood pressure (!) 163/99, pulse (!) 114, temperature 97.6 F (36.4 C), temperature source Oral, resp. rate 20, height 5\' 10"  (1.778 m), weight 113.4 kg (250 lb), SpO2 94 %.  GENERAL:  71 y.o.-year-old patient lying in the bed with no acute distress.  EYES: Pupils equal, round, reactive to light and accommodation. No scleral icterus. Extraocular muscles intact.  HEENT: Head atraumatic, normocephalic. Oropharynx and nasopharynx clear.  NECK:  Supple, no jugular venous distention. No thyroid enlargement, no tenderness.  LUNGS: Normal breath sounds bilaterally, no wheezing, rales,rhonchi or crepitation. No use of accessory muscles of respiration.  CARDIOVASCULAR: S1, S2 normal. No murmurs, rubs, or gallops.  ABDOMEN: Soft, nontender, nondistended. Bowel sounds present. No organomegaly or mass.  EXTREMITIES: No pedal edema, cyanosis, or clubbing.  NEUROLOGIC: Cranial nerves II through XII are intact. Muscle strength 5/5 in all extremities. Sensation intact. Gait not checked.  PSYCHIATRIC: The patient is alert and oriented x 3.  SKIN: No obvious rash, lesion, or ulcer.   LABORATORY PANEL:   CBC  Recent Labs Lab 06/26/17 2150  WBC 6.9  HGB 14.6  HCT 43.7  PLT 310  MCV 86.6  MCH 29.0  MCHC 33.4  RDW 14.0   LYMPHSABS 0.8*  MONOABS 0.0*  EOSABS 0.0  BASOSABS 0.0   ------------------------------------------------------------------------------------------------------------------  Chemistries   Recent Labs Lab 06/26/17 2150  NA 141  K 3.9  CL 112*  CO2 18*  GLUCOSE 268*  BUN 28*  CREATININE 0.94  CALCIUM 9.7   ------------------------------------------------------------------------------------------------------------------ estimated creatinine clearance is 75 mL/min (by C-G formula based on SCr of 0.94 mg/dL). ------------------------------------------------------------------------------------------------------------------ No results for input(s): TSH, T4TOTAL, T3FREE, THYROIDAB in the last 72 hours.  Invalid input(s): FREET3   Coagulation profile No results for input(s): INR, PROTIME in the last 168 hours. ------------------------------------------------------------------------------------------------------------------- No results for input(s): DDIMER in the last 72 hours. -------------------------------------------------------------------------------------------------------------------  Cardiac Enzymes  Recent Labs Lab 06/26/17 2150 06/27/17 0014  TROPONINI <0.03 0.13*   ------------------------------------------------------------------------------------------------------------------ Invalid input(s): POCBNP  ---------------------------------------------------------------------------------------------------------------  Urinalysis    Component Value Date/Time   COLORURINE ORANGE (A) 02/24/2017 1442   APPEARANCEUR CLEAR 02/24/2017 1442   APPEARANCEUR Hazy 11/15/2013 0114   LABSPEC  1.021 02/24/2017 1442   LABSPEC 1.029 11/15/2013 0114   PHURINE  02/24/2017 1442    TEST NOT REPORTED DUE TO COLOR INTERFERENCE OF URINE PIGMENT   GLUCOSEU (A) 02/24/2017 1442    TEST NOT REPORTED DUE TO COLOR INTERFERENCE OF URINE PIGMENT   GLUCOSEU Negative 11/15/2013 0114   HGBUR  (A) 02/24/2017 1442    TEST NOT REPORTED DUE TO COLOR INTERFERENCE OF URINE PIGMENT   BILIRUBINUR (A) 02/24/2017 1442    TEST NOT REPORTED DUE TO COLOR INTERFERENCE OF URINE PIGMENT   BILIRUBINUR Negative 11/15/2013 0114   KETONESUR (A) 02/24/2017 1442    TEST NOT REPORTED DUE TO COLOR INTERFERENCE OF URINE PIGMENT   PROTEINUR (A) 02/24/2017 1442    TEST NOT REPORTED DUE TO COLOR INTERFERENCE OF URINE PIGMENT   NITRITE (A) 02/24/2017 1442    TEST NOT REPORTED DUE TO COLOR INTERFERENCE OF URINE PIGMENT   LEUKOCYTESUR (A) 02/24/2017 1442    TEST NOT REPORTED DUE TO COLOR INTERFERENCE OF URINE PIGMENT   LEUKOCYTESUR 2+ 11/15/2013 0114     RADIOLOGY: Dg Chest 2 View  Result Date: 06/26/2017 CLINICAL DATA:  71 year old female with chest pain. EXAM: CHEST  2 VIEW COMPARISON:  chest radiograph dated 04/30/2014 FINDINGS: Subsegmental left lung base atelectasis/ scarring. Pneumonia is not excluded. Clinical correlation is recommended. Mild bronchiectatic changes. The right lung is clear. There is no pleural effusion or pneumothorax. The cardiac silhouette is within normal limits. No acute osseous pathology. IMPRESSION: Left lung base atelectasis/ scarring versus infiltrate. Clinical correlation is recommended. Electronically Signed   By: Anner Crete M.D.   On: 06/26/2017 22:07   Ct Angio Chest Pe W And/or Wo Contrast  Result Date: 06/27/2017 CLINICAL DATA:  Chest pain. Epidural for spinal problems performed earlier today. Tachycardia. EXAM: CT ANGIOGRAPHY CHEST WITH CONTRAST TECHNIQUE: Multidetector CT imaging of the chest was performed using the standard protocol during bolus administration of intravenous contrast. Multiplanar CT image reconstructions and MIPs were obtained to evaluate the vascular anatomy. CONTRAST:  75 mL Isovue 370 COMPARISON:  None. FINDINGS: Cardiovascular: Examination is technically limited by motion artifact and weak contrast bolus. Central and proximal segmental pulmonary  arteries are moderately well opacified without filling defect identified. No evidence of significant pulmonary embolus. Normal caliber thoracic aorta. No evidence of dissection. Scattered aortic calcifications. Calcification in the coronary arteries. Normal heart size. No pericardial effusion. Mediastinum/Nodes: No enlarged mediastinal, hilar, or axillary lymph nodes. Thyroid gland, trachea, and esophagus demonstrate no significant findings. Lungs/Pleura: Evaluation is limited due to motion artifact. There is linear atelectasis in the lung bases. No consolidation or airspace disease is appreciated. No pleural effusions. No pneumothorax. Upper Abdomen: No acute abnormality. Musculoskeletal: Degenerative changes in the spine. No destructive bone lesions identified. Review of the MIP images confirms the above findings. IMPRESSION: 1. Technically limited study but no evidence of significant pulmonary embolus. 2. No evidence of active pulmonary disease. Linear atelectasis in the lung bases. 3. Aortic atherosclerosis. 4. Coronary artery calcifications. Electronically Signed   By: Lucienne Capers M.D.   On: 06/27/2017 01:57    EKG: Orders placed or performed during the hospital encounter of 06/26/17  . ED EKG  . ED EKG  . EKG 12-Lead  . EKG 12-Lead  . EKG 12-Lead  . EKG 12-Lead    IMPRESSION AND PLAN: 71 year old female patient with history of colon cancer, hypertension, hyperlipidemia, GERD presented to the emergency room with chest tightness. Admitting diagnosis 1. Non-ST elevation MI 2. Hypertension 3. Hyperlipidemia 4.  GERD 5. Colon cancer Treatment plan Admit patient to telemetry Cycle troponin Start patient on heparin drip Cardiology consultation Check echocardiogram Resume Lopressor Supportive care  All the records are reviewed and case discussed with ED provider. Management plans discussed with the patient, family and they are in agreement.  CODE STATUS:FULL CODE Code Status  History    This patient does not have a recorded code status. Please follow your organizational policy for patients in this situation.       TOTAL TIME TAKING CARE OF THIS PATIENT: 51 minutes.    Saundra Shelling M.D on 06/27/2017 at 2:20 AM  Between 7am to 6pm - Pager - [phone]  After 6pm go to [email or web address] - password [email or web address] [facility]  [facility]  Office  [phone]  CC: Primary care physician; Lavonne Chick, MD

## 2017-06-27 NOTE — Progress Notes (Signed)
[place] for heparin  Indication: chest pain/ACS   Pharmacy consulted for heparin drip management for 71 yo female admitted with NSTEMI. Patient receiving heparin drip at 1000 units/hr.   Goal of Therapy:  Heparin level 0.3-0.7 units/ml Monitor platelets by anticoagulation protocol: Yes   Plan:  Confirmed with nurse that there had been no interruptions in heparin therapy. Will bolus 2800 units x 1 and will increase heparin drip rate to 1350 units/hr. Will obtain follow up anti-Xa level at 2200.   Allergies  Allergen Reactions  . Morphine And Related Nausea Only  . Latex Rash    Patient Measurements: Height: 5\' 10"  (177.8 cm) Weight: 250 lb (113.4 kg) IBW/kg (Calculated) : 68.5 Heparin Dosing Weight: 94 kg  Vital Signs: Temp: 97.6 F (36.4 C) (08/02 0810) Temp Source: Oral (08/02 0810) BP: 163/79 (08/02 1224) Pulse Rate: 77 (08/02 1224)  Labs:  Recent Labs  06/26/17 2150 06/27/17 0014 06/27/17 0450 06/27/17 1019 06/27/17 1307  HGB 14.6  --   --   --   --   HCT 43.7  --   --   --   --   PLT 310  --   --   --   --   APTT  --   --  24  --   --   LABPROT  --   --  12.9  --   --   INR  --   --  0.97  --   --   HEPARINUNFRC  --   --   --   --  0.15*  CREATININE 0.94  --  0.90  --   --   TROPONINI <0.03 0.13* 0.72* 0.72*  --     Estimated Creatinine Clearance: 78.3 mL/min (by C-G formula based on SCr of 0.9 mg/dL).   Medical History: Past Medical History:  Diagnosis Date  . Cancer [facility])    a. s/p partial left nephrectomy.  . Chest pain    a. 2012 ETT: no ischemia.  . Colon cancer ([place]) 1992   T3, N1, M0.  . Colon polyp 2011  . Essential hypertension   . GERD (gastroesophageal reflux disease)   . Hyperlipidemia   . Lumbar spinal stenosis   . Systolic murmur    a. 74/1287 Echo: EF 60-65%, no rwma, Gr1 DD, midlly dil LA, PASP 53mmHg. No significant valvular dzs.    Medications:  Scheduled:  . [START ON 06/28/2017]  aspirin  81 mg Oral Pre-Cath  . [START ON 06/28/2017] aspirin EC  81 mg Oral Daily  . atorvastatin  80 mg Oral q1800  . carvedilol  12.5 mg Oral BID WC  . lidocaine  1 patch Transdermal Q24H  . nortriptyline  10 mg Oral Daily  . pantoprazole  40 mg Oral Daily  . [START ON 06/28/2017] pneumococcal 23 valent vaccine  0.5 mL Intramuscular Tomorrow-1000    Pharmacy will continue to monitor and adjust per consult.   MLS 06/27/2017

## 2017-06-27 NOTE — ED Notes (Addendum)
Pt reports since sitting in chair her back pain has "improved greatly." Pt sitting in chair at this time in NAD.

## 2017-06-27 NOTE — Progress Notes (Signed)
[place] responded to an OR for prayer. Pt was in a good mood but worried about her relationship with God. Pt stated that she works too much and has no time for relationships. Pt stated that she has quit her job as of today and wants a fresh start. CH offered prayer for a closer relationship with God. Pt stated that she feels better already.    06/27/17 1000  Clinical Encounter Type  Visited With Patient  Visit Type Initial;Spiritual support  Referral From Nurse  Consult/Referral To Chaplain  Spiritual Encounters  Spiritual Needs Prayer;Emotional

## 2017-06-27 NOTE — Progress Notes (Signed)
[facility] [facility] [place] NAME: Carrie Larsen    MR#:  [id number]  DATE OF BIRTH:  05/22/1946  SUBJECTIVE:  CHIEF COMPLAINT:   Chief Complaint  Patient presents with  . Chest Pain   - Came in with chest pain and noted to have elevated troponin overnight. Started on heparin drip. -Seen by cardiology and for cardiac catheterization tomorrow. Denies any active chest pain at this time  REVIEW OF SYSTEMS:  Review of Systems  Constitutional: Positive for malaise/fatigue. Negative for chills and fever.  HENT: Negative for congestion, ear discharge, hearing loss and nosebleeds.   Eyes: Negative for blurred vision.  Respiratory: Negative for cough, shortness of breath and wheezing.   Cardiovascular: Positive for chest pain. Negative for palpitations and leg swelling.  Gastrointestinal: Negative for abdominal pain, constipation, diarrhea, nausea and vomiting.  Genitourinary: Negative for dysuria and urgency.  Musculoskeletal: Negative for myalgias.  Neurological: Negative for dizziness, speech change, focal weakness, seizures and headaches.  Psychiatric/Behavioral: Negative for depression.    DRUG ALLERGIES:   Allergies  Allergen Reactions  . Morphine And Related Nausea Only  . Latex Rash    VITALS:  Blood pressure (!) 163/79, pulse 77, temperature 97.6 F (36.4 C), temperature source Oral, resp. rate 18, height 5\' 10"  (1.778 m), weight 113.4 kg (250 lb), SpO2 95 %.  PHYSICAL EXAMINATION:  Physical Exam  GENERAL:  71 y.o.-year-old patient lying in the bed with no acute distress.  EYES: Pupils equal, round, reactive to light and accommodation. No scleral icterus. Extraocular muscles intact.  HEENT: Head atraumatic, normocephalic. Oropharynx and nasopharynx clear.  NECK:  Supple, no jugular venous distention. No thyroid enlargement, no tenderness.  LUNGS: Normal breath sounds bilaterally, no wheezing, rales,rhonchi or crepitation. No use of  accessory muscles of respiration. Decreased bibasilar breath sounds with fine crackles.Marland Kitchen CARDIOVASCULAR: S1, S2 normal. No murmurs, rubs, or gallops.  ABDOMEN: Soft, nontender, nondistended. Bowel sounds present. No organomegaly or mass.  EXTREMITIES: No pedal edema, cyanosis, or clubbing.  NEUROLOGIC: Cranial nerves II through XII are intact. Muscle strength 5/5 in all extremities. Sensation intact. Gait not checked.  PSYCHIATRIC: The patient is alert and oriented x 3.  SKIN: No obvious rash, lesion, or ulcer.    LABORATORY PANEL:   CBC  Recent Labs Lab 06/26/17 2150  WBC 6.9  HGB 14.6  HCT 43.7  PLT 310   ------------------------------------------------------------------------------------------------------------------  Chemistries   Recent Labs Lab 06/27/17 0450  NA 140  K 4.3  CL 107  CO2 24  GLUCOSE 129*  BUN 25*  CREATININE 0.90  CALCIUM 10.1   ------------------------------------------------------------------------------------------------------------------  Cardiac Enzymes  Recent Labs Lab 06/27/17 1019  TROPONINI 0.72*   ------------------------------------------------------------------------------------------------------------------  RADIOLOGY:  Dg Chest 2 View  Result Date: 06/26/2017 CLINICAL DATA:  71 year old female with chest pain. EXAM: CHEST  2 VIEW COMPARISON:  chest radiograph dated 04/30/2014 FINDINGS: Subsegmental left lung base atelectasis/ scarring. Pneumonia is not excluded. Clinical correlation is recommended. Mild bronchiectatic changes. The right lung is clear. There is no pleural effusion or pneumothorax. The cardiac silhouette is within normal limits. No acute osseous pathology. IMPRESSION: Left lung base atelectasis/ scarring versus infiltrate. Clinical correlation is recommended. Electronically Signed   By: Anner Crete M.D.   On: 06/26/2017 22:07   Ct Angio Chest Pe W And/or Wo Contrast  Result Date: 06/27/2017 CLINICAL DATA:   Chest pain. Epidural for spinal problems performed earlier today. Tachycardia. EXAM: CT ANGIOGRAPHY CHEST WITH CONTRAST TECHNIQUE: Multidetector CT  imaging of the chest was performed using the standard protocol during bolus administration of intravenous contrast. Multiplanar CT image reconstructions and MIPs were obtained to evaluate the vascular anatomy. CONTRAST:  75 mL Isovue 370 COMPARISON:  None. FINDINGS: Cardiovascular: Examination is technically limited by motion artifact and weak contrast bolus. Central and proximal segmental pulmonary arteries are moderately well opacified without filling defect identified. No evidence of significant pulmonary embolus. Normal caliber thoracic aorta. No evidence of dissection. Scattered aortic calcifications. Calcification in the coronary arteries. Normal heart size. No pericardial effusion. Mediastinum/Nodes: No enlarged mediastinal, hilar, or axillary lymph nodes. Thyroid gland, trachea, and esophagus demonstrate no significant findings. Lungs/Pleura: Evaluation is limited due to motion artifact. There is linear atelectasis in the lung bases. No consolidation or airspace disease is appreciated. No pleural effusions. No pneumothorax. Upper Abdomen: No acute abnormality. Musculoskeletal: Degenerative changes in the spine. No destructive bone lesions identified. Review of the MIP images confirms the above findings. IMPRESSION: 1. Technically limited study but no evidence of significant pulmonary embolus. 2. No evidence of active pulmonary disease. Linear atelectasis in the lung bases. 3. Aortic atherosclerosis. 4. Coronary artery calcifications. Electronically Signed   By: Lucienne Capers M.D.   On: 06/27/2017 01:57    EKG:   Orders placed or performed during the hospital encounter of 06/26/17  . ED EKG  . ED EKG  . EKG 12-Lead  . EKG 12-Lead  . EKG 12-Lead  . EKG 12-Lead  . ED EKG  . ED EKG    ASSESSMENT AND PLAN:   71 year old female with past medical  history significant for hyperlipidemia, hypertension, GERD, obesity, history of renal carcinoma status post partial left nephrectomy, history of colon cancer presents to hospital secondary to chest pain.  #1 NSTEMI-presented with typical chest pain radiating to the back and also to her jaw. No known prior cardiac history. -Troponins are positive year, started on heparin drip. -CT negative for PE. However due to contrast exposure for the same this morning, cardiac catheterization will be performed in a.m. especially since patient is asymptomatic at this time. -Started on aspirin, statin. Also on [facility] cardiology consult. Echocardiogram has been ordered  #2 hypertension-started on carvedilol - hold off on adding ACEI or ARB due to her single kidney, renal function is stable though. Will wait for ECHO  #3 hyperlipidemia-LDL of 239. History of intolerance to statins before. -For now started on Lipitor high dose. 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  #6 DVT prophylaxis-already on heparin drip   All the records are reviewed and case discussed with Care Management/Social Workerr. Management plans discussed with the patient, family and they are in agreement.  CODE STATUS: Full code  TOTAL TIME TAKING CARE OF THIS PATIENT: 38 minutes.   POSSIBLE D/C IN 1-2 DAYS, DEPENDING ON CLINICAL CONDITION.   Roslynn Holte M.D on 06/27/2017 at 3:42 PM  Between 7am to 6pm - Pager - [phone]  After 6pm go to [email or web address] - password [email or web address] [facility]  Office  [phone]  CC: Primary care physician; Lavonne Chick, MD

## 2017-06-27 NOTE — Consult Note (Signed)
Cardiology Consult    Patient ID: Carrie Larsen MRN: [id number], DOB/AGE: 05-18-1946   Admit date: 06/26/2017 Date of Consult: 06/27/2017  Primary Physician: Lavonne Chick, MD Primary Cardiologist: Johnny Bridge, MD  Requesting Provider: Claria Dice, MD  Patient Profile    Carrie Larsen is a 71 y.o. female with a history of HTN, HL, systolic murmur, renal cancer s/p partial L nephrectomy, colon CA, GERD, and obeisty, who is being seen today for the evaluation of chest pain and NSTEMI at the request of Dr. Tressia Miners.  Past Medical History   Past Medical History:  Diagnosis Date  . Cancer [facility])    a. s/p partial left nephrectomy.  . Chest pain    a. 2012 ETT: no ischemia.  . Colon cancer ([place]) 1992   T3, N1, M0.  . Colon polyp 2011  . Essential hypertension   . GERD (gastroesophageal reflux disease)   . Hyperlipidemia   . Lumbar spinal stenosis   . Systolic murmur    a. 21/1941 Echo: EF 60-65%, no rwma, Gr1 DD, midlly dil LA, PASP 22mmHg. No significant valvular dzs.    Past Surgical History:  Procedure Laterality Date  . ABDOMINAL HYSTERECTOMY    . APPENDECTOMY    . BACK SURGERY    . CATARACT EXTRACTION W/PHACO Left 04/24/2016   Procedure: CATARACT EXTRACTION PHACO AND INTRAOCULAR LENS PLACEMENT (IOC);  Surgeon: Birder Robson, MD;  Location: [facility] ORS;  Service: Ophthalmology;  Laterality: Left;  [place] 45.8 AP% 16.4 CDE 7.53 FLUID PACK LOT # [id number]  . CATARACT EXTRACTION W/PHACO Right 06/05/2016   Procedure: CATARACT EXTRACTION PHACO AND INTRAOCULAR LENS PLACEMENT (IOC);  Surgeon: Birder Robson, MD;  Location: [facility] ORS;  Service: Ophthalmology;  Laterality: Right;  [place] 25.9 AP% 21.9 CDE 5.68 Fluid pack lot # [id number]  . COLON RESECTION  1992  . COLON SURGERY    . COLONOSCOPY  03-14-10   Dr Bary Castilla, tubular adenoma at 25 cm.  . COLONOSCOPY WITH PROPOFOL N/A 06/07/2015   Procedure: COLONOSCOPY WITH PROPOFOL;  Surgeon: Robert Bellow, MD;  Location: [facility] ENDOSCOPY;   Service: Endoscopy;  Laterality: N/A;  . HERNIA REPAIR  [id number]   umbilical  . NEPHRECTOMY  1992   left  . RIB RESECTION     removal 4 ribs  . TONSILLECTOMY       Allergies  Allergies  Allergen Reactions  . Morphine And Related Nausea Only  . Latex Rash    History of Present Illness    71 y/o ? with a h/o HTN, HL, systolic murmur, GERD, obesity, renal carcinoma s/p partial L nephrectomy, and colon cancer.  She was last seen In October 2016, at which time she underwent echocardiography due to a systolic murmur. This showed normal LV function without significant valvular disease. She says that over the past year and a half, she has done reasonably well. 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 This is fairly heavy exertion for her, she does not generally having difficulty performing her duties. 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  She has been having significant low back and buttock pain and was recently found to have an abnormal MRI with spinal stenosis at L3-L4 and L4-L5. She was seen by orthopedics, and just yesterday, underwent epidural injection in [place].  Yesterday evening, she began to have discomfort at the site of injection, she took  Tylenol without she then developed discomfort between her shoulder blades which radiated chest associated with numbness in her gums. She did not have any dyspnea, nausea, or diaphoresis. She immediately called 911 and upon their arrival, she was treated with sublingual nitroglycerin with some relief. She was placed on oxygen at [place]. Here, ECG was nonacute, though she was tachycardic. CTA of the chest was performed and negative for PE. Coronary calcification was noted. Initial troponin was normal, however subsequent troponin rose to 0.13. Total duration of symptoms was about 1-2 hours. She was admitted for further evaluation, his  troponin has risen further to 0.72 this morning. She is currently chest pain-free.  Inpatient Medications    . [START ON 06/28/2017] aspirin  81 mg Oral Pre-Cath  . [START ON 06/28/2017] aspirin EC  81 mg Oral Daily  . atorvastatin  80 mg Oral q1800  . lidocaine  1 patch Transdermal Q24H  . metoprolol tartrate  50 mg Oral BID  . nortriptyline  10 mg Oral Daily  . pantoprazole  40 mg Oral Daily  . [START ON 06/28/2017] pneumococcal 23 valent vaccine  0.5 mL Intramuscular Tomorrow-1000    Family History    Family History  Problem Relation Age of Onset  . Colon cancer Mother   . Cerebral aneurysm Father     Social History    Social History   Social History  . Marital status: Single    Spouse name: N/A  . Number of children: N/A  . Years of education: N/A   Occupational History  . Server [employer]   Social History Main Topics  . Smoking status: Never Smoker  . Smokeless tobacco: Never Used  . Alcohol use No  . Drug use: No  . Sexual activity: Not on file   Other Topics Concern  . Not on file   Social History Narrative  . No narrative on file     Review of Systems    General:  No chills, fever, night sweats or weight changes.  Cardiovascular:  +++ chest pain and upper back/midscapular pain, no dyspnea on exertion, edema, orthopnea, palpitations, paroxysmal nocturnal dyspnea. Dermatological: No rash, lesions/masses Respiratory: No cough, dyspnea Urologic: No hematuria, dysuria Abdominal:   No nausea, vomiting, diarrhea, bright red blood per rectum, melena, or hematemesis Neurologic:  No visual changes, wkns, changes in mental status. All other systems reviewed and are otherwise negative except as noted above.  Physical Exam    Blood pressure (!) 163/79, pulse 77, temperature 97.6 F (36.4 C), temperature source Oral, resp. rate 18, height 5\' 10"  (1.778 m), weight 250 lb (113.4 kg), SpO2 95 %.  General: Pleasant, NAD Psych: Normal affect. Neuro: Alert  and oriented X 3. Moves all extremities spontaneously. HEENT: Normal  Neck: Supple without bruits or JVD. Lungs:  Resp regular and unlabored, diminished breath sounds at bases with bibasilar crackles. Heart: RRR no s3, s4, or murmurs. Abdomen: Soft, non-tender, non-distended, BS + x 4.  Extremities: No clubbing, cyanosis or edema. DP/PT/Radials 2+ and equal bilaterally.  Labs     Recent Labs  06/26/17 2150 06/27/17 0014 06/27/17 0450 06/27/17 1019  TROPONINI <0.03 0.13* 0.72* 0.72*   Lab Results  Component Value Date   WBC 6.9 06/26/2017   HGB 14.6 06/26/2017   HCT 43.7 06/26/2017   MCV 86.6 06/26/2017   PLT 310 06/26/2017     Recent Labs Lab 06/27/17 0450  NA 140  K 4.3  CL 107  CO2 24  BUN 25*  CREATININE 0.90  CALCIUM 10.1  GLUCOSE 129*   Lab Results  Component Value Date   CHOL 324 (H) 06/27/2017   HDL 54 06/27/2017   LDLCALC 239 (H) 06/27/2017   TRIG 154 (H) 06/27/2017    Radiology Studies    Dg Chest 2 View  Result Date: 06/26/2017 CLINICAL DATA:  71 year old female with chest pain. EXAM: CHEST  2 VIEW COMPARISON:  chest radiograph dated 04/30/2014 FINDINGS: Subsegmental left lung base atelectasis/ scarring. Pneumonia is not excluded. Clinical correlation is recommended. Mild bronchiectatic changes. The right lung is clear. There is no pleural effusion or pneumothorax. The cardiac silhouette is within normal limits. No acute osseous pathology. IMPRESSION: Left lung base atelectasis/ scarring versus infiltrate. Clinical correlation is recommended. Electronically Signed   By: Anner Crete M.D.   On: 06/26/2017 22:07   Ct Angio Chest Pe W And/or Wo Contrast  Result Date: 06/27/2017 CLINICAL DATA:  Chest pain. Epidural for spinal problems performed earlier today. Tachycardia. EXAM: CT ANGIOGRAPHY CHEST WITH CONTRAST TECHNIQUE: Multidetector CT imaging of the chest was performed using the standard protocol during bolus administration of intravenous contrast.  Multiplanar CT image reconstructions and MIPs were obtained to evaluate the vascular anatomy. CONTRAST:  75 mL Isovue 370 COMPARISON:  None. FINDINGS: Cardiovascular: Examination is technically limited by motion artifact and weak contrast bolus. Central and proximal segmental pulmonary arteries are moderately well opacified without filling defect identified. No evidence of significant pulmonary embolus. Normal caliber thoracic aorta. No evidence of dissection. Scattered aortic calcifications. Calcification in the coronary arteries. Normal heart size. No pericardial effusion. Mediastinum/Nodes: No enlarged mediastinal, hilar, or axillary lymph nodes. Thyroid gland, trachea, and esophagus demonstrate no significant findings. Lungs/Pleura: Evaluation is limited due to motion artifact. There is linear atelectasis in the lung bases. No consolidation or airspace disease is appreciated. No pleural effusions. No pneumothorax. Upper Abdomen: No acute abnormality. Musculoskeletal: Degenerative changes in the spine. No destructive bone lesions identified. Review of the MIP images confirms the above findings. IMPRESSION: 1. Technically limited study but no evidence of significant pulmonary embolus. 2. No evidence of active pulmonary disease. Linear atelectasis in the lung bases. 3. Aortic atherosclerosis. 4. Coronary artery calcifications. Electronically Signed   By: Lucienne Capers M.D.   On: 06/27/2017 01:57   Mr Lumbar Spine Wo Contrast  Result Date: 06/11/2017 CLINICAL DATA:  Low back and left hip and lower extremity pain for approximately 2 months. No known injury. EXAM: MRI LUMBAR SPINE WITHOUT CONTRAST TECHNIQUE: Multiplanar, multisequence MR imaging of the lumbar spine was performed. No intravenous contrast was administered. COMPARISON:  CT abdomen and pelvis 02/25/2017. FINDINGS: Segmentation:  Standard. Alignment: There is convex left scoliosis. 0.3 cm facet mediated anterolisthesis L4 on L5 is identified. There  is 0.5 cm retrolisthesis L5 on S1. Vertebrae: No fracture or worrisome lesion. Hemangioma in L3 is noted. Mild degenerative endplate signal change is seen at L4-5 and L5-S1. Conus medullaris: Extends to the L1-2 level and appears normal. Paraspinal and other soft tissues: T2 hyperintense inferior lesion lower pole right kidney is compatible with a cyst and was present on the prior CT. Disc levels: T11-12 and T12-L1 are imaged in the sagittal plane only and negative. L1-2: Minimal disc bulge without stenosis. L2-3: Minimal disc bulge and mild facet degenerative disease without stenosis. L3-4: Short pedicle length, disc bulge and ligamentum flavum thickening cause moderately severe to severe central canal stenosis. Protruding disc in the right foramen contacts the exiting right L3 root. L4-5: Congenitally narrow central canal, disc  bulge and ligamentum flavum thickening cause severe central canal stenosis. Disc and facet arthropathy cause moderately severe left foraminal narrowing. The right foramen is open. L5-S1: Shallow disc bulge endplate spur without central canal stenosis. Moderate to moderately severe bilateral foraminal narrowing is worse on the left. IMPRESSION: 1. 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 Protruding disc in the right foramen contacts the exiting right L3 root. 2. 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 Moderately severe left foraminal narrowing is seen at this level. 3. Moderate to moderately severe bilateral foraminal narrowing at L5-S1 is worse on the left. Electronically Signed   By: Inge Rise M.D.   On: 06/11/2017 13:25    ECG & Cardiac Imaging    Sinus tachycardia, 108, RBBB.  Assessment & Plan    1. Non-STEMI: Patient presented to the emergency department on August 1 with mid scapular back pain, chest pain, and gum pain/numbness. ECG was without acute ST changes. Initial troponin was normal but she has  subsequently ruled in and troponin is currently 0.72. She is not currently having any chest or back pain. She is hemodynamically stable. She remains on aspirin, statin, heparin, and beta blocker therapy. We discussed options for management and will plan to proceed with diagnostic catheterization tomorrow.  The patient understands that risks include but are not limited to stroke (1 in 1000), death (1 in 1), kidney failure [usually temporary] (1 in 500), bleeding (1 in 200), allergic reaction [possibly serious] (1 in 200), and agrees to proceed.  Because she had CT angiogram of the chest yesterday, with prior history of solitary kidney, we feel it is most prudent to defer her until tomorrow and will plan to hydrate her in the morning.  2. Essential hypertension: Her pressure was elevated this morning. She takes beta blocker at home but it does not sound that she takes it always as prescribed. I will switch this to carvedilol for better blood pressure lowering. We can consider adding amlodipine if pressures remain markedly elevated. With history of solitary kidney, will avoid acei/arb.  3. Hyperlipidemia: LDL 239. Lipitor 80 mg ordered. She will likely require a PCSK9 inhibitor.  Signed, Murray Hodgkins, NP 06/27/2017, 12:40 PM

## 2017-06-27 NOTE — Progress Notes (Signed)
Nurse paged [facility] to visit with patient. [facility] was on another call. [place] will place patient on list to be seen and check consults for reminder.

## 2017-06-27 NOTE — Progress Notes (Signed)
[place] for heparin  Indication: chest pain/ACS   Pharmacy consulted for heparin drip management for 71 yo female admitted with NSTEMI. Patient receiving heparin drip at 1000 units/hr.   Goal of Therapy:  Heparin level 0.3-0.7 units/ml Monitor platelets by anticoagulation protocol: Yes   Plan:  Confirmed with nurse that there had been no interruptions in heparin therapy. Will bolus 2800 units x 1 and will increase heparin drip rate to 1350 units/hr. Will obtain follow up anti-Xa level at 2200.   8/2 @ 2200 HL 0.50 therapeutic. Will continue current rate and will recheck HL @ 0600.  Allergies  Allergen Reactions  . Morphine And Related Nausea Only  . Latex Rash    Patient Measurements: Height: 5\' 10"  (177.8 cm) Weight: 250 lb (113.4 kg) IBW/kg (Calculated) : 68.5 Heparin Dosing Weight: 94 kg  Vital Signs: BP: 163/79 (08/02 1224) Pulse Rate: 77 (08/02 1224)  Labs:  Recent Labs  06/26/17 2150  06/27/17 0450 06/27/17 1019 06/27/17 1307 06/27/17 1633 06/27/17 2152  HGB 14.6  --   --   --   --   --   --   HCT 43.7  --   --   --   --   --   --   PLT 310  --   --   --   --   --   --   APTT  --   --  24  --   --   --   --   LABPROT  --   --  12.9  --   --   --   --   INR  --   --  0.97  --   --   --   --   HEPARINUNFRC  --   --   --   --  0.15*  --  0.50  CREATININE 0.94  --  0.90  --   --   --   --   TROPONINI <0.03  < > 0.72* 0.72*  --  0.88*  --   < > = values in this interval not displayed.  Estimated Creatinine Clearance: 78.3 mL/min (by C-G formula based on SCr of 0.9 mg/dL).   Medical History: Past Medical History:  Diagnosis Date  . Cancer [facility])    a. s/p partial left nephrectomy.  . Chest pain    a. 2012 ETT: no ischemia.  . Colon cancer ([place]) 1992   T3, N1, M0.  . Colon polyp 2011  . Essential hypertension   . GERD (gastroesophageal reflux disease)   . Hyperlipidemia   . Lumbar spinal stenosis   . Systolic  murmur    a. 10/4579 Echo: EF 60-65%, no rwma, Gr1 DD, midlly dil LA, PASP 40mmHg. No significant valvular dzs.    Medications:  Scheduled:  . [START ON 06/28/2017] aspirin  81 mg Oral Pre-Cath  . [START ON 06/28/2017] aspirin EC  81 mg Oral Daily  . atorvastatin  80 mg Oral q1800  . carvedilol  12.5 mg Oral BID WC  . lidocaine  1 patch Transdermal Q24H  . nortriptyline  10 mg Oral Daily  . pantoprazole  40 mg Oral Daily  . [START ON 06/28/2017] pneumococcal 23 valent vaccine  0.5 mL Intramuscular Tomorrow-1000    Pharmacy will continue to monitor and adjust per consult.   Tobie Lords, PharmD, BCPS Clinical Pharmacist 06/27/2017

## 2017-06-28 ENCOUNTER — Inpatient Hospital Stay (HOSPITAL_COMMUNITY)
Admit: 2017-06-28 | Discharge: 2017-06-28 | Disposition: A | Discharge status: Home / Self Care | Payer: Medicare Other | Attending: Internal Medicine | Admitting: Internal Medicine

## 2017-06-28 ENCOUNTER — Encounter: Payer: Self-pay | Admitting: *Deleted

## 2017-06-28 ENCOUNTER — Encounter
Admission: EM | Disposition: A | Discharge status: Home / Self Care | Payer: Self-pay | Source: Home / Self Care | Attending: Internal Medicine

## 2017-06-28 DIAGNOSIS — I5032 Chronic diastolic (congestive) heart failure: Secondary | ICD-10-CM

## 2017-06-28 DIAGNOSIS — I2510 Atherosclerotic heart disease of native coronary artery without angina pectoris: Secondary | ICD-10-CM

## 2017-06-28 HISTORY — PX: CORONARY STENT INTERVENTION: CATH118234

## 2017-06-28 HISTORY — PX: LEFT HEART CATH AND CORONARY ANGIOGRAPHY: CATH118249

## 2017-06-28 LAB — CBC
HCT: 43.5 % (ref 35.0–47.0)
Hemoglobin: 14.4 g/dL (ref 12.0–16.0)
MCH: 29.1 pg (ref 26.0–34.0)
MCHC: 33.1 g/dL (ref 32.0–36.0)
MCV: 87.9 fL (ref 80.0–100.0)
Platelets: 304 10*3/uL (ref 150–440)
RBC: 4.95 MIL/uL (ref 3.80–5.20)
RDW: 14.5 % (ref 11.5–14.5)
WBC: 13.7 10*3/uL — ABNORMAL HIGH (ref 3.6–11.0)

## 2017-06-28 LAB — BASIC METABOLIC PANEL
Anion gap: 9 (ref 5–15)
BUN: 29 mg/dL — ABNORMAL HIGH (ref 6–20)
CO2: 24 mmol/L (ref 22–32)
Calcium: 10 mg/dL (ref 8.9–10.3)
Chloride: 108 mmol/L (ref 101–111)
Creatinine, Ser: 0.86 mg/dL (ref 0.44–1.00)
GFR calc Af Amer: >60 mL/min (ref >60)
GFR calc non Af Amer: >60 mL/min (ref >60)
Glucose, Bld: 102 mg/dL — ABNORMAL HIGH (ref 65–99)
Potassium: 4.4 mmol/L (ref 3.5–5.1)
Sodium: 141 mmol/L (ref 135–145)

## 2017-06-28 LAB — ECHOCARDIOGRAM COMPLETE
Height: 70 in
Weight: 4003.2 oz

## 2017-06-28 LAB — HEMOGLOBIN A1C
Hgb A1c MFr Bld: 5.8 % — ABNORMAL HIGH (ref 4.8–5.6)
Mean Plasma Glucose: 120 mg/dL

## 2017-06-28 LAB — POCT ACTIVATED CLOTTING TIME: Activated Clotting Time: 367 seconds

## 2017-06-28 LAB — HEPARIN LEVEL (UNFRACTIONATED): Heparin Unfractionated: 0.69 IU/mL (ref 0.30–0.70)

## 2017-06-28 SURGERY — LEFT HEART CATH AND CORONARY ANGIOGRAPHY
Anesthesia: Moderate Sedation

## 2017-06-28 MED ORDER — ASPIRIN 81 MG PO CHEW
81.0000 mg | CHEWABLE_TABLET | Freq: Every day | ORAL | Status: DC
  Administered 2017-06-29: 81 mg via ORAL
  Filled 2017-06-28: qty 1

## 2017-06-28 MED ORDER — LABETALOL HCL 5 MG/ML IV SOLN: 10.0000 mg | INTRAVENOUS | Status: AC | PRN

## 2017-06-28 MED ORDER — SODIUM CHLORIDE 0.9 % IV SOLN: 250.0000 mL | INTRAVENOUS | Status: DC | PRN

## 2017-06-28 MED ORDER — MIDAZOLAM HCL 2 MG/2ML IJ SOLN
INTRAMUSCULAR | Status: DC | PRN
  Administered 2017-06-28 (×2): 1 mg via INTRAVENOUS

## 2017-06-28 MED ORDER — ASPIRIN 81 MG PO CHEW
CHEWABLE_TABLET | ORAL | Status: AC
  Filled 2017-06-28: qty 4

## 2017-06-28 MED ORDER — IOPAMIDOL (ISOVUE-300) INJECTION 61%
INTRAVENOUS | Status: DC | PRN
  Administered 2017-06-28: 195 mL via INTRA_ARTERIAL

## 2017-06-28 MED ORDER — FENTANYL CITRATE (PF) 100 MCG/2ML IJ SOLN
INTRAMUSCULAR | Status: AC
  Filled 2017-06-28: qty 2

## 2017-06-28 MED ORDER — IOPAMIDOL (ISOVUE-300) INJECTION 61%
INTRAVENOUS | Status: DC | PRN
  Administered 2017-06-28: 90 mL via INTRA_ARTERIAL

## 2017-06-28 MED ORDER — TICAGRELOR 90 MG PO TABS
ORAL_TABLET | ORAL | Status: AC
  Filled 2017-06-28: qty 2

## 2017-06-28 MED ORDER — ACETAMINOPHEN 325 MG PO TABS
650.0000 mg | ORAL_TABLET | ORAL | Status: DC | PRN
  Filled 2017-06-28: qty 2

## 2017-06-28 MED ORDER — SODIUM CHLORIDE 0.9 % IV SOLN
0.2500 mg/kg/h | INTRAVENOUS | Status: AC
  Filled 2017-06-28: qty 250

## 2017-06-28 MED ORDER — BIVALIRUDIN BOLUS VIA INFUSION - CUPID
INTRAVENOUS | Status: DC | PRN
  Administered 2017-06-28: 85.05 mg via INTRAVENOUS

## 2017-06-28 MED ORDER — SODIUM CHLORIDE 0.9% FLUSH
3.0000 mL | Freq: Two times a day (BID) | INTRAVENOUS | Status: DC
  Administered 2017-06-28: 3 mL via INTRAVENOUS

## 2017-06-28 MED ORDER — MIDAZOLAM HCL 2 MG/2ML IJ SOLN
INTRAMUSCULAR | Status: AC
  Filled 2017-06-28: qty 2

## 2017-06-28 MED ORDER — SODIUM CHLORIDE 0.9 % WEIGHT BASED INFUSION: 1.0000 mL/kg/h | INTRAVENOUS | Status: AC

## 2017-06-28 MED ORDER — HYDRALAZINE HCL 20 MG/ML IJ SOLN: 5.0000 mg | INTRAMUSCULAR | Status: AC | PRN

## 2017-06-28 MED ORDER — LABETALOL HCL 5 MG/ML IV SOLN
INTRAVENOUS | Status: AC
  Filled 2017-06-28: qty 4

## 2017-06-28 MED ORDER — TICAGRELOR 90 MG PO TABS
90.0000 mg | ORAL_TABLET | Freq: Two times a day (BID) | ORAL | Status: DC
  Administered 2017-06-28 – 2017-06-29 (×2): 90 mg via ORAL
  Filled 2017-06-28 (×2): qty 1

## 2017-06-28 MED ORDER — FENTANYL CITRATE (PF) 100 MCG/2ML IJ SOLN
INTRAMUSCULAR | Status: DC | PRN
  Administered 2017-06-28: 50 ug via INTRAVENOUS

## 2017-06-28 MED ORDER — BIVALIRUDIN TRIFLUOROACETATE 250 MG IV SOLR
INTRAVENOUS | Status: AC
  Filled 2017-06-28: qty 250

## 2017-06-28 MED ORDER — NITROGLYCERIN 5 MG/ML IV SOLN
INTRAVENOUS | Status: AC
  Filled 2017-06-28: qty 10

## 2017-06-28 MED ORDER — SODIUM CHLORIDE 0.9% FLUSH: 3.0000 mL | INTRAVENOUS | Status: DC | PRN

## 2017-06-28 MED ORDER — MIDAZOLAM HCL 2 MG/2ML IJ SOLN
INTRAMUSCULAR | Status: DC | PRN
  Administered 2017-06-28: 1 mg via INTRAVENOUS

## 2017-06-28 MED ORDER — ONDANSETRON HCL 4 MG/2ML IJ SOLN: 4.0000 mg | Freq: Four times a day (QID) | INTRAMUSCULAR | Status: DC | PRN

## 2017-06-28 MED ORDER — TICAGRELOR 90 MG PO TABS
ORAL_TABLET | ORAL | Status: DC | PRN
  Administered 2017-06-28: 180 mg via ORAL

## 2017-06-28 MED ORDER — NITROGLYCERIN 1 MG/10 ML FOR IR/CATH LAB
INTRA_ARTERIAL | Status: DC | PRN
  Administered 2017-06-28 (×3): 200 ug via INTRACORONARY

## 2017-06-28 MED ORDER — FENTANYL CITRATE (PF) 100 MCG/2ML IJ SOLN
INTRAMUSCULAR | Status: DC | PRN
  Administered 2017-06-28: 25 ug via INTRAVENOUS

## 2017-06-28 MED ORDER — BIVALIRUDIN TRIFLUOROACETATE 250 MG IV SOLR
INTRAVENOUS | Status: AC | PRN
  Administered 2017-06-28: 1.75 mg/kg/h via INTRAVENOUS

## 2017-06-28 MED ORDER — HEPARIN (PORCINE) IN NACL 2-0.9 UNIT/ML-% IJ SOLN
INTRAMUSCULAR | Status: AC
  Filled 2017-06-28: qty 500

## 2017-06-28 MED ORDER — LABETALOL HCL 5 MG/ML IV SOLN
INTRAVENOUS | Status: DC | PRN
  Administered 2017-06-28 (×2): 10 mg via INTRAVENOUS

## 2017-06-28 MED ORDER — ASPIRIN 81 MG PO CHEW
CHEWABLE_TABLET | ORAL | Status: DC | PRN
  Administered 2017-06-28: 324 mg via ORAL

## 2017-06-28 SURGICAL SUPPLY — 20 items
BALLN TREK RX 2.5X20 (BALLOONS) ×3
BALLN ~~LOC~~ TREK RX 3.25X12 (BALLOONS) ×3
BALLOON TREK RX 2.5X20 (BALLOONS) ×1 IMPLANT
BALLOON ~~LOC~~ TREK RX 3.25X12 (BALLOONS) ×1 IMPLANT
CATH 5FR JL4 DIAGNOSTIC (CATHETERS) ×3 IMPLANT
CATH 5FR JR4 DIAGNOSTIC (CATHETERS) ×3 IMPLANT
CATH INFINITI 5FR ANG PIGTAIL (CATHETERS) IMPLANT
CATH LAUNCHER 6FR EBU3.5 (CATHETERS) ×3 IMPLANT
DEVICE CLOSURE MYNXGRIP 6/7F (Vascular Products) ×3 IMPLANT
DEVICE INFLAT 30 PLUS (MISCELLANEOUS) ×3 IMPLANT
DEVICE SAFEGUARD 24CM (GAUZE/BANDAGES/DRESSINGS) ×3 IMPLANT
KIT MANI 3VAL PERCEP (MISCELLANEOUS) ×3 IMPLANT
NEEDLE PERC 18GX7CM (NEEDLE) ×3 IMPLANT
NEEDLE SMART 18G ACCESS (NEEDLE) ×3 IMPLANT
PACK CARDIAC CATH (CUSTOM PROCEDURE TRAY) ×3 IMPLANT
SHEATH AVANTI 5FR X 11CM (SHEATH) ×3 IMPLANT
SHEATH AVANTI 6FR X 11CM (SHEATH) ×3 IMPLANT
STENT XIENCE ALPINE RX 3.0X23 (Permanent Stent) ×3 IMPLANT
WIRE EMERALD 3MM-J .035X150CM (WIRE) ×3 IMPLANT
WIRE G HI TQ BMW 190 (WIRE) ×3 IMPLANT

## 2017-06-28 NOTE — Care Management (Signed)
Patient to discharge on Brilinta. Patient verbally  confirmed pharmacy coverage with insurance.  Provided with coupon for 30 day trial  

## 2017-06-28 NOTE — Care Management Important Message (Signed)
Important Message  Patient Details  Name: Carrie Larsen MRN: [id number] Date of Birth: 04-06-1946   Medicare Important Message Given:  Yes    Katrina Stack, RN 06/28/2017, 4:45 PM

## 2017-06-28 NOTE — Progress Notes (Signed)
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

## 2017-06-28 NOTE — Progress Notes (Signed)
*  PRELIMINARY RESULTS* Echocardiogram 2D Echocardiogram has been performed.  Carrie Larsen 06/28/2017, 8:08 AM

## 2017-06-28 NOTE — Progress Notes (Addendum)
[facility] NAME: Carrie Larsen    MR#:  [id number]  DATE OF BIRTH:  05-May-1946  SUBJECTIVE:  CHIEF COMPLAINT:   Chief Complaint  Patient presents with  . Chest Pain   - Status post cardiac catheterization today and stent placed in the  Left circumflex -denies any chest pain at this time.  REVIEW OF SYSTEMS:  Review of Systems  Constitutional: Positive for malaise/fatigue. Negative for chills and fever.  HENT: Negative for congestion, ear discharge, hearing loss and nosebleeds.   Eyes: Negative for blurred vision.  Respiratory: Negative for cough, shortness of breath and wheezing.   Cardiovascular: Negative for chest pain, palpitations and leg swelling.  Gastrointestinal: Negative for abdominal pain, constipation, diarrhea, nausea and vomiting.  Genitourinary: Negative for dysuria and urgency.  Musculoskeletal: Negative for myalgias.  Neurological: Negative for dizziness, speech change, focal weakness, seizures and headaches.  Psychiatric/Behavioral: Negative for depression.    DRUG ALLERGIES:   Allergies  Allergen Reactions  . Morphine And Related Nausea Only  . Latex Rash    VITALS:  Blood pressure 127/69, pulse 65, temperature 98.2 F (36.8 C), temperature source Oral, resp. rate 15, height 5\' 10"  (1.778 m), weight 113.4 kg (250 lb), SpO2 (!) 89 %.  PHYSICAL EXAMINATION:  Physical Exam  GENERAL:  71 y.o.-year-old patient lying in the bed with no acute distress.  EYES: Pupils equal, round, reactive to light and accommodation. No scleral icterus. Extraocular muscles intact.  HEENT: Head atraumatic, normocephalic. Oropharynx and nasopharynx clear.  NECK:  Supple, no jugular venous distention. No thyroid enlargement, no tenderness.  LUNGS: Normal breath sounds bilaterally, no wheezing, rales,rhonchi or crepitation. No use of accessory muscles of respiration. Decreased bibasilar breath sounds. CARDIOVASCULAR: S1, S2  normal. No murmurs, rubs, or gallops.  ABDOMEN: Soft, nontender, nondistended. Bowel sounds present. No organomegaly or mass.  EXTREMITIES: No pedal edema, cyanosis, or clubbing. Right groin dressing intact. Minimal bleeding is noted around NEUROLOGIC: Cranial nerves II through XII are intact. Muscle strength 5/5 in all extremities. Sensation intact. Gait not checked.  PSYCHIATRIC: The patient is alert and oriented x 3.  SKIN: No obvious rash, lesion, or ulcer.    LABORATORY PANEL:   CBC  Recent Labs Lab 06/28/17 0619  WBC 13.7*  HGB 14.4  HCT 43.5  PLT 304   ------------------------------------------------------------------------------------------------------------------  Chemistries   Recent Labs Lab 06/28/17 0619  NA 141  K 4.4  CL 108  CO2 24  GLUCOSE 102*  BUN 29*  CREATININE 0.86  CALCIUM 10.0   ------------------------------------------------------------------------------------------------------------------  Cardiac Enzymes  Recent Labs Lab 06/27/17 1633  TROPONINI 0.88*   ------------------------------------------------------------------------------------------------------------------  RADIOLOGY:  Dg Chest 2 View  Result Date: 06/26/2017 CLINICAL DATA:  71 year old female with chest pain. EXAM: CHEST  2 VIEW COMPARISON:  chest radiograph dated 04/30/2014 FINDINGS: Subsegmental left lung base atelectasis/ scarring. Pneumonia is not excluded. Clinical correlation is recommended. Mild bronchiectatic changes. The right lung is clear. There is no pleural effusion or pneumothorax. The cardiac silhouette is within normal limits. No acute osseous pathology. IMPRESSION: Left lung base atelectasis/ scarring versus infiltrate. Clinical correlation is recommended. Electronically Signed   By: Anner Crete M.D.   On: 06/26/2017 22:07   Ct Angio Chest Pe W And/or Wo Contrast  Result Date: 06/27/2017 CLINICAL DATA:  Chest pain. Epidural for spinal problems performed  earlier today. Tachycardia. EXAM: CT ANGIOGRAPHY CHEST WITH CONTRAST TECHNIQUE: Multidetector CT imaging of the chest was performed using the standard  protocol during bolus administration of intravenous contrast. Multiplanar CT image reconstructions and MIPs were obtained to evaluate the vascular anatomy. CONTRAST:  75 mL Isovue 370 COMPARISON:  None. FINDINGS: Cardiovascular: Examination is technically limited by motion artifact and weak contrast bolus. Central and proximal segmental pulmonary arteries are moderately well opacified without filling defect identified. No evidence of significant pulmonary embolus. Normal caliber thoracic aorta. No evidence of dissection. Scattered aortic calcifications. Calcification in the coronary arteries. Normal heart size. No pericardial effusion. Mediastinum/Nodes: No enlarged mediastinal, hilar, or axillary lymph nodes. Thyroid gland, trachea, and esophagus demonstrate no significant findings. Lungs/Pleura: Evaluation is limited due to motion artifact. There is linear atelectasis in the lung bases. No consolidation or airspace disease is appreciated. No pleural effusions. No pneumothorax. Upper Abdomen: No acute abnormality. Musculoskeletal: Degenerative changes in the spine. No destructive bone lesions identified. Review of the MIP images confirms the above findings. IMPRESSION: 1. Technically limited study but no evidence of significant pulmonary embolus. 2. No evidence of active pulmonary disease. Linear atelectasis in the lung bases. 3. Aortic atherosclerosis. 4. Coronary artery calcifications. Electronically Signed   By: Lucienne Capers M.D.   On: 06/27/2017 01:57    EKG:   Orders placed or performed during the hospital encounter of 06/26/17  . ED EKG  . ED EKG  . EKG 12-Lead  . EKG 12-Lead  . EKG 12-Lead  . EKG 12-Lead  . ED EKG  . ED EKG  . EKG 12-Lead immediately post procedure  . EKG 12-Lead  . EKG 12-Lead immediately post procedure    ASSESSMENT  AND PLAN:   71 year old female with past medical history significant for hyperlipidemia, hypertension, GERD, obesity, history of renal carcinoma status post partial left nephrectomy, history of colon cancer presents to hospital secondary to chest pain.  #1 NSTEMI- -Troponins are positive, was on heparin drip. -CT negative for PE.  -Appreciate cardiology consult, status post cardiac catheterization today revealing 100% chronically occluded RCA but 75% first marginal from the circumflex noted as well. Drug-eluting stent placed in the left circumflex. -on aspirin, statin. Also on Coreg, started on brilinta -Echocardiogram with no wall motion abnormalities, EF is 60% -Cardiac rehabilitation referral at discharge  #2 hypertension-on carvedilol - hold off on adding ACEI or ARB due to her single kidney, renal function is stable though. -Echo with preserved ejection fraction  #3 hyperlipidemia-LDL of 239. History of intolerance to statins before. -For now started on Lipitor high dose. 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  #6 DVT prophylaxis-. Heparin drip discontinued. started on Lovenox starting tonight   Ambulate in a.m. Check right groin for any bleeding. Possible discharge in a.m.  All the records are reviewed and case discussed with Care Management/Social Workerr. Management plans discussed with the patient, family and they are in agreement.  CODE STATUS: Full code  TOTAL TIME TAKING CARE OF THIS PATIENT: 36 minutes.   POSSIBLE D/C TOMORROW, DEPENDING ON CLINICAL CONDITION.   Demetrious Rainford M.D on 06/28/2017 at 1:11 PM  Between 7am to 6pm - Pager - [phone]  After 6pm go to [email or web address] - password [email or web address] [facility]  Office  [phone]  CC: Primary care physician; Lavonne Chick, MD

## 2017-06-28 NOTE — Progress Notes (Signed)
[place] for heparin  Indication: chest pain/ACS   Pharmacy consulted for heparin drip management for 71 yo female admitted with NSTEMI. Patient receiving heparin drip at 1000 units/hr.   Goal of Therapy:  Heparin level 0.3-0.7 units/ml Monitor platelets by anticoagulation protocol: Yes   Plan:  Confirmed with nurse that there had been no interruptions in heparin therapy. Will bolus 2800 units x 1 and will increase heparin drip rate to 1350 units/hr. Will obtain follow up anti-Xa level at 2200.   8/2 @ 2200 HL 0.50 therapeutic. Will continue current rate and will recheck HL @ 0600.  8/3 @ 0600 HL 0.69 therapeutic. Will continue current rate and will recheck w/ am labs.  Allergies  Allergen Reactions  . Morphine And Related Nausea Only  . Latex Rash    Patient Measurements: Height: 5\' 10"  (177.8 cm) Weight: 250 lb 3.2 oz (113.5 kg) IBW/kg (Calculated) : 68.5 Heparin Dosing Weight: 94 kg  Vital Signs: Temp: 97.7 F (36.5 C) (08/03 0410) Temp Source: Oral (08/03 0410) BP: 137/72 (08/03 0410) Pulse Rate: 69 (08/03 0410)  Labs:  Recent Labs  06/26/17 2150  06/27/17 0450 06/27/17 1019 06/27/17 1307 06/27/17 1633 06/27/17 2152 06/28/17 0619  HGB 14.6  --   --   --   --   --   --  14.4  HCT 43.7  --   --   --   --   --   --  43.5  PLT 310  --   --   --   --   --   --  304  APTT  --   --  24  --   --   --   --   --   LABPROT  --   --  12.9  --   --   --   --   --   INR  --   --  0.97  --   --   --   --   --   HEPARINUNFRC  --   --   --   --  0.15*  --  0.50 0.69  CREATININE 0.94  --  0.90  --   --   --   --  0.86  TROPONINI <0.03  < > 0.72* 0.72*  --  0.88*  --   --   < > = values in this interval not displayed.  Estimated Creatinine Clearance: 81.9 mL/min (by C-G formula based on SCr of 0.86 mg/dL).   Medical History: Past Medical History:  Diagnosis Date  . Cancer [facility])    a. s/p partial left nephrectomy.  . Chest pain     a. 2012 ETT: no ischemia.  . Colon cancer ([place]) 1992   T3, N1, M0.  . Colon polyp 2011  . Essential hypertension   . GERD (gastroesophageal reflux disease)   . Hyperlipidemia   . Lumbar spinal stenosis   . Systolic murmur    a. 70/9628 Echo: EF 60-65%, no rwma, Gr1 DD, midlly dil LA, PASP 64mmHg. No significant valvular dzs.    Medications:  Scheduled:  . aspirin EC  81 mg Oral Daily  . atorvastatin  80 mg Oral q1800  . carvedilol  12.5 mg Oral BID WC  . lidocaine  1 patch Transdermal Q24H  . nortriptyline  10 mg Oral Daily  . pantoprazole  40 mg Oral Daily  . pneumococcal 23 valent vaccine  0.5 mL Intramuscular Tomorrow-1000  Pharmacy will continue to monitor and adjust per consult.   Tobie Lords, PharmD, BCPS Clinical Pharmacist 06/28/2017

## 2017-06-28 NOTE — Progress Notes (Signed)
Report called to Janett Billow, RN of pt. Room 253. Right groin clean, dry, intact with PAD on; No hematoma, edema, erythema, ecchymosis at present. Pt. Without any verbalized c/o at present. Stable for tx with RN, on monitor.

## 2017-06-28 NOTE — Progress Notes (Signed)
Placed external female catheter (Purewick) secondary to pt. Inability to void on arrival. Pt. Denies any c/o CP,SOB, N/V, dizziness, HA. No acute distress on arrival.

## 2017-06-28 NOTE — Progress Notes (Signed)
Pt. Unable to urinate with external catheter. Called MD: orders received. Performed I & O catheter with 3 RN assist. Pt. Tolerated well. UOP: 750 ml clear, yellow urine. Pt. States "I only have one kidney, but I usually pee pretty good.

## 2017-06-29 LAB — BASIC METABOLIC PANEL
Anion gap: 9 (ref 5–15)
BUN: 23 mg/dL — ABNORMAL HIGH (ref 6–20)
CO2: 23 mmol/L (ref 22–32)
Calcium: 9.5 mg/dL (ref 8.9–10.3)
Chloride: 107 mmol/L (ref 101–111)
Creatinine, Ser: 0.96 mg/dL (ref 0.44–1.00)
GFR calc Af Amer: >60 mL/min (ref >60)
GFR calc non Af Amer: 58 mL/min — ABNORMAL LOW (ref >60)
Glucose, Bld: 108 mg/dL — ABNORMAL HIGH (ref 65–99)
Potassium: 4 mmol/L (ref 3.5–5.1)
Sodium: 139 mmol/L (ref 135–145)

## 2017-06-29 LAB — CBC
HCT: 40.9 % (ref 35.0–47.0)
Hemoglobin: 13.7 g/dL (ref 12.0–16.0)
MCH: 29.5 pg (ref 26.0–34.0)
MCHC: 33.6 g/dL (ref 32.0–36.0)
MCV: 87.9 fL (ref 80.0–100.0)
Platelets: 306 10*3/uL (ref 150–440)
RBC: 4.65 MIL/uL (ref 3.80–5.20)
RDW: 14.1 % (ref 11.5–14.5)
WBC: 11.9 10*3/uL — ABNORMAL HIGH (ref 3.6–11.0)

## 2017-06-29 LAB — HEPARIN LEVEL (UNFRACTIONATED): Heparin Unfractionated: <0.1 IU/mL — ABNORMAL LOW (ref 0.30–0.70)

## 2017-06-29 MED ORDER — NITROGLYCERIN 0.4 MG SL SUBL: 0.4000 mg | SUBLINGUAL_TABLET | SUBLINGUAL | 1 refills | Status: DC | PRN

## 2017-06-29 MED ORDER — TICAGRELOR 90 MG PO TABS: 90.0000 mg | ORAL_TABLET | Freq: Two times a day (BID) | ORAL | 0 refills | Status: DC

## 2017-06-29 MED ORDER — ISOSORBIDE MONONITRATE ER 30 MG PO TB24: 30.0000 mg | ORAL_TABLET | Freq: Every day | ORAL | 0 refills | Status: DC

## 2017-06-29 MED ORDER — ISOSORBIDE MONONITRATE ER 30 MG PO TB24
30.0000 mg | ORAL_TABLET | Freq: Every day | ORAL | Status: DC
  Administered 2017-06-29: 30 mg via ORAL
  Filled 2017-06-29: qty 1

## 2017-06-29 MED ORDER — ATORVASTATIN CALCIUM 80 MG PO TABS: 80.0000 mg | ORAL_TABLET | Freq: Every day | ORAL | 0 refills | Status: DC

## 2017-06-29 MED ORDER — CARVEDILOL 12.5 MG PO TABS: 12.5000 mg | ORAL_TABLET | Freq: Two times a day (BID) | ORAL | 0 refills | Status: DC

## 2017-06-29 NOTE — Progress Notes (Signed)
Pt to be discharged this afternoon. Ambulated at length in hallway. Tolerated well. Iv's and tele removed. disch instructions and prescrips given to pt. To be transpported by friend.

## 2017-06-29 NOTE — Progress Notes (Signed)
Progress Note  Patient Name: Carrie Larsen Date of Encounter: 06/29/2017  Primary Cardiologist: Dr Rockey Situ  Subjective   The patient has a lot of questions about her cardiac status. She denies recurrence of chest or jaw discomfort. No shortness of breath at rest. She's had discomfort in her right groin area overnight.  Inpatient Medications    Scheduled Meds: . aspirin  81 mg Oral Daily  . aspirin EC  81 mg Oral Daily  . atorvastatin  80 mg Oral q1800  . carvedilol  12.5 mg Oral BID WC  . lidocaine  1 patch Transdermal Q24H  . nortriptyline  10 mg Oral Daily  . pantoprazole  40 mg Oral Daily  . pneumococcal 23 valent vaccine  0.5 mL Intramuscular Tomorrow-1000  . sodium chloride flush  3 mL Intravenous Q12H  . ticagrelor  90 mg Oral BID   Continuous Infusions: . sodium chloride    . heparin 1,350 Units/hr (06/27/17 2245)   PRN Meds: sodium chloride, acetaminophen, acetaminophen, nitroGLYCERIN, ondansetron (ZOFRAN) IV, ondansetron (ZOFRAN) IV, sodium chloride flush   Vital Signs    Vitals:   06/28/17 1521 06/28/17 1932 06/29/17 0502 06/29/17 0800  BP: 140/67 (!) 129/58 (!) 154/62 (!) 157/68  Pulse: 71 79 73 75  Resp: 18 18 18 18   Temp: 97.6 F (36.4 C) 98 F (36.7 C) (!) 97.5 F (36.4 C) 97.9 F (36.6 C)  TempSrc: Oral Oral Oral Oral  SpO2: 94% 95% 94% 95%  Weight:      Height:        Intake/Output Summary (Last 24 hours) at 06/29/17 1039 Last data filed at 06/29/17 0900  Gross per 24 hour  Intake              480 ml  Output             2350 ml  Net            -1870 ml   Filed Weights   06/26/17 2149 06/28/17 0500 06/28/17 0906  Weight: 250 lb (113.4 kg) 250 lb 3.2 oz (113.5 kg) 250 lb (113.4 kg)    Telemetry    Normal sinus rhythm without significant arrhythmia - Personally Reviewed  ECG    This morning's EKG is reviewed. This demonstrates sinus rhythm with a possible age-indeterminate inferior infarct - Personally Reviewed  Physical Exam    Alert and oriented, anxious woman GEN: No acute distress.   Neck: No JVD Cardiac: RRR, no murmurs, rubs, or gallops.  Respiratory: Clear to auscultation bilaterally. GI: Soft, nontender, non-distended  MS: No edema; No deformity. The right groin has diffuse ecchymosis. The groin is soft with no evidence of firm hematoma. There is no femoral bruit. The groin is nontender to palpation. Neuro:  Nonfocal  Psych: Normal affect   Labs    Chemistry Recent Labs Lab 06/27/17 0450 06/28/17 0619 06/29/17 0444  NA 140 141 139  K 4.3 4.4 4.0  CL 107 108 107  CO2 24 24 23   GLUCOSE 129* 102* 108*  BUN 25* 29* 23*  CREATININE 0.90 0.86 0.96  CALCIUM 10.1 10.0 9.5  GFRNONAA >60 >60 58*  GFRAA >60 >60 >60  ANIONGAP 9 9 9      Hematology Recent Labs Lab 06/26/17 2150 06/28/17 0619 06/29/17 0444  WBC 6.9 13.7* 11.9*  RBC 5.05 4.95 4.65  HGB 14.6 14.4 13.7  HCT 43.7 43.5 40.9  MCV 86.6 87.9 87.9  MCH 29.0 29.1 29.5  MCHC 33.4 33.1 33.6  RDW 14.0 14.5 14.1  PLT 310 304 306    Cardiac Enzymes Recent Labs Lab 06/27/17 0014 06/27/17 0450 06/27/17 1019 06/27/17 1633  TROPONINI 0.13* 0.72* 0.72* 0.88*   No results for input(s): TROPIPOC in the last 168 hours.   BNPNo results for input(s): BNP, PROBNP in the last 168 hours.   DDimer No results for input(s): DDIMER in the last 168 hours.   Radiology    No results found.  Cardiac Studies   2D Echo: Study Conclusions  - Left ventricle: The cavity size was normal. Systolic function was   normal. The estimated ejection fraction was in the range of 60%   to 65%. Wall motion was normal; there were no regional wall   motion abnormalities. Doppler parameters are consistent with   abnormal left ventricular relaxation (grade 1 diastolic   dysfunction). - Left atrium: The atrium was mildly dilated. - Right ventricle: Systolic function was normal. - Pulmonary arteries: Systolic pressure was within the normal    range.  Cardiac Cath: Conclusion    2nd Mrg lesion, 90 %stenosed.  1st Diag-2 lesion, 75 %stenosed.  1st Diag-1 lesion, 80 %stenosed.  Mid RCA lesion, 100 %stenosed.  Prox LAD lesion, 35 %stenosed.  The left ventricular ejection fraction is 55-65% by visual estimate.  The left ventricular systolic function is normal.    Procedural Details/Technique   Technical Details Cardiac Catheterization Procedure Note  Name: Carrie Larsen MRN: [id number] DOB: 02-Oct-1946  Procedure: Left Heart Cath, Selective Coronary Angiography, LV angiography  Indication:  71 year old woman with morbid obesity, hyperlipidemia, presents with chest pain, elevated troponin consistent with non-STEMI History of partial left nephrectomy, renal carcinoma Presents for cardiac cath, peak TNT of 0.88   Procedural details: The right groin was prepped, draped, and anesthetized with 1% lidocaine. Using modified Seldinger technique, a 5 French sheath was introduced into the right femoral artery. Standard Judkins catheters (JL 5, JR 5 and pigtail catheter) were used for coronary angiography and left ventriculography. Catheter exchanges were performed over a guidewire. There were no immediate procedural complications. The patient was transferred to the post catheterization recovery area for further monitoring.  Moderate sedation: 3. I was Face to Face with the patient during this time: (code: [phone])   Procedural Findings:   Coronary angiography:  Coronary dominance: Right  Left mainstem: Large vessel that bifurcates into the LAD and left circumflex, no significant disease noted  Left anterior descending (LAD): Large vessel that extends to the apical region, diagonal branch 2 of moderate size,  Diagonal #1 has severe proximal disease estimated at 70 to 80%, calcified, tortuous, moderate sized branch  Left circumflex (LCx): Large vessel with OM branch 2, the OM1 with severe proximal disease estimated at  90%, moderate to large size vessel  Right coronary artery (RCA): Right dominant vessel with PL and PDA, occluded in the proximal to mid region, likely CTO with collaterals from left to right  Left ventriculography: Left ventricular systolic function is normal, LVEF is estimated at 55-65%, there is no significant mitral regurgitation , no significant aortic valve stenosis  Final Conclusions:  Occluded mid RCA , collaterals from left to right (medical management) Severe disease of proximal OM1, amenable to PCI (discussed with Dr. Clayborn Bigness) Severe proximal diagonal disease, tortuous and calcified region, medical management unless she develops unstable angina sx.  Recommendations:  Discussed with Dr. Clayborn Bigness, He will attempt PCI on the Bright 06/28/2017, 11:22 AM  Diagnostic Diagram  Patient Profile     71 y.o. female with non-STEMI, no previous history of coronary artery disease.  Assessment & Plan    1. Non-STEMI: Now status post PCI of the obtuse marginal. Reviewed cardiac cath/PCI films today. The patient has normal LV function and residual disease in the diagonal and RCA with a total occlusion of the RCA supplied by left to right collaterals. I agree that medical management is appropriate. However, if the patient has refractory symptoms of angina, I think her RCA is amenable to PCI. Would consider referral to Dr. Martin[place] or Dr Irish Lack if recurrent angina and CTO-PCI could be performed.    Medical program reviewed and includes aspirin, carvedilol, atorvastatin at high dose, and brilinta. Medicines appear appropriate and patient is stable for hospital discharge today. I'm going to add isosorbide to her medical program for additional antianginal therapy.  2. Hyperlipidemia: Patient with multivessel coronary artery disease, acute coronary syndrome, and markedly elevated cholesterol of 324 with LDL 239. Agree with a high intensity statin drug. Follow-up lipids  and LFTs as an outpatient.  Disposition: Stable for discharge home today. I advised her to stay out of work next week. Will arrange follow-up with Dr. Rockey Situ or his PA/NP at the end of next week.  Deatra James, MD  06/29/2017, 10:39 AM

## 2017-06-29 NOTE — Progress Notes (Addendum)
     Carrie Larsen was admitted to the Hospital on 06/26/2017 and Discharged  06/29/2017 and should be excused from work/school   for  7days starting 06/26/2017 , may return to work/school  ,needs to follow with cardiology before returning to work    [facility] M.D on 06/29/2017,at 1:13 PM

## 2017-07-01 ENCOUNTER — Telehealth: Payer: Self-pay | Admitting: Cardiovascular Disease

## 2017-07-01 ENCOUNTER — Encounter: Payer: Self-pay | Admitting: Cardiovascular Disease

## 2017-07-01 NOTE — Telephone Encounter (Signed)
Pt states she had a heart attack last week, and was release this past Saturday, 8/4. She has some questions regarding her medication, her aspirin. Pt request to leave a detailed message, she will be sleeping.

## 2017-07-01 NOTE — Telephone Encounter (Signed)
This is also a TCM Call. Scheduled to see Dr Rockey Situ on 07/15/17 at 2:20 pm.

## 2017-07-01 NOTE — Telephone Encounter (Signed)
Patient contacted regarding discharge from [facility] on 06/29/17.  Patient understands to follow up with provider ? On 07/15/17 at 2:20 pm at [facility].  Patient understands discharge instructions? Yes but has some questions about medications. Patient understands medications and regiment? No, we discussed her medications as listed on her discharge instructions. She verbalized understanding on what and how to take each one.   Patient understands to bring all medications to this visit? Yes

## 2017-07-01 NOTE — Discharge Summary (Signed)
Carrie Larsen, is a 71 y.o. female  DOB 12/03/1945  MRN [id number].  Admission date:  06/26/2017  Admitting Physician  Saundra Shelling, MD  Discharge Date:  06/29/2017   Primary MD  Lavonne Chick, MD  Recommendations for primary care physician for things to follow:  Follow-up with Dr. Candis Musa in 1-2 weeks. Follow-up with PCP in one week  Admission Diagnosis  Chest Pain nstemi   Discharge Diagnosis  Chest Pain nstemi    Active Problems:   Non-STEMI (non-ST elevated myocardial infarction) [facility])      Past Medical History:  Diagnosis Date  . Cancer [facility])    a. s/p partial left nephrectomy.  . Chest pain    a. 2012 ETT: no ischemia.  . Colon cancer ([place]) 1992   T3, N1, M0.  . Colon polyp 2011  . Essential hypertension   . GERD (gastroesophageal reflux disease)   . Hyperlipidemia   . Lumbar spinal stenosis   . Systolic murmur    a. 73/7106 Echo: EF 60-65%, no rwma, Gr1 DD, midlly dil LA, PASP 8mmHg. No significant valvular dzs.    Past Surgical History:  Procedure Laterality Date  . ABDOMINAL HYSTERECTOMY    . APPENDECTOMY    . BACK SURGERY    . CATARACT EXTRACTION W/PHACO Left 04/24/2016   Procedure: CATARACT EXTRACTION PHACO AND INTRAOCULAR LENS PLACEMENT (IOC);  Surgeon: Birder Robson, MD;  Location: [facility] ORS;  Service: Ophthalmology;  Laterality: Left;  [place] 45.8 AP% 16.4 CDE 7.53 FLUID PACK LOT # [id number]  . CATARACT EXTRACTION W/PHACO Right 06/05/2016   Procedure: CATARACT EXTRACTION PHACO AND INTRAOCULAR LENS PLACEMENT (IOC);  Surgeon: Birder Robson, MD;  Location: [facility] ORS;  Service: Ophthalmology;  Laterality: Right;  [place] 25.9 AP% 21.9 CDE 5.68 Fluid pack lot # [id number]  . COLON RESECTION  1992  . COLON SURGERY    . COLONOSCOPY  03-14-10   Dr Bary Castilla, tubular adenoma at 25 cm.  . COLONOSCOPY  WITH PROPOFOL N/A 06/07/2015   Procedure: COLONOSCOPY WITH PROPOFOL;  Surgeon: Robert Bellow, MD;  Location: [facility] ENDOSCOPY;  Service: Endoscopy;  Laterality: N/A;  . CORONARY STENT INTERVENTION N/A 06/28/2017   Procedure: CORONARY STENT INTERVENTION;  Surgeon: Yolonda Kida, MD;  Location: [place] CV LAB;  Service: Cardiovascular;  Laterality: N/A;  . HERNIA REPAIR  [id number]   umbilical  . LEFT HEART CATH AND CORONARY ANGIOGRAPHY N/A 06/28/2017   Procedure: LEFT HEART CATH AND CORONARY ANGIOGRAPHY;  Surgeon: Minna Merritts, MD;  Location: [place] CV LAB;  Service: Cardiovascular;  Laterality: N/A;  . NEPHRECTOMY  1992   left  . RIB RESECTION     removal 4 ribs  . TONSILLECTOMY         History of present illness and  Hospital Course:     Kindly see H&P for history of present illness and admission details, please review complete Labs, Consult reports and Test reports for all details in brief  HPI  from the history and physical done on the day of admission Given-year-old female patient with history of kidney cancer, colon cancer, essential hypertension, hyperlipidemia, GERD came in because of retrosternal chest pain. Found to have elevated troponins up to  0.88.   Hospital Course  #1. Chest pain with elevated troponin consistent with non-ST elevation MI. Seen by cardiology. Patient had initial CAT scan of the chest in the emergency room showed no PE. Patient continued on heparin drip, aspirin, beta blockers, statins. Taken to cardiac catheter,  status post PCI of obtuse marginal artery. Discharge home with aspirin, Coreg, atorvastatin at high dose, Brilinta. Discussed at length about all this medications and onset all the questions of the time of discharge. We also added isosorbide for antianginal therapy as per cardiology recommendation. Cardiac catheter also showedOccluded proximal to mid RCA with collaterals from left-to-right, appears chronic decision is made to treat  medically with antianginal therapy, statins, Brilinta and patient can follow up with Dr. Candis Musa as an outpatient. Discharge home with high intensity statin with Lipitor 80 mg daily. Patient's ejection fraction estimated at about 55-65%. Discharge home with phase II cardiac rehabilitation.  2, .severe hyperlipidemia with LDL 239. History of intolerance to statins before. Tried Lipitor 80 mg daily because of diffuse three-vessel disease as per cardiology. #3 essential hypertension: Patient started on Coreg. We stopped the metoprolol. And I explained this to patient. #4 hyperlipidemia: Started on statins at high dose and tried this secondary to her CAD. Can be tried on PSK 9  inhibitors as an outpatient if she cannot tolerate statins.  #5 GERD: Continue PPIs  History of renal cell carcinoma status post partial nephrectomy.  Patient very anxious about discharge medications, hospital diagnosis with MI status post stenting and had lots of questions to cardiologist and also to me. Explained in detail about the procedure and also the medications that she needs to take. Discharge Condition: stable   Follow UP  Follow-up Information    Minna Merritts, MD Follow up in 1 week(s).   Specialty:  Cardiology Contact information: [address] [place] [address] [phone]             Discharge Instructions  and  Discharge Medications     Discharge Instructions    AMB Referral to Cardiac Rehabilitation - Phase II    Complete by:  As directed    Diagnosis:   Coronary Stents NSTEMI       Allergies as of 06/29/2017      Reactions   Morphine And Related Nausea Only   Latex Rash      Medication List    STOP taking these medications   metoprolol tartrate 50 MG tablet Commonly known as:  LOPRESSOR   oxyCODONE-acetaminophen 5-325 MG tablet Commonly known as:  PERCOCET/ROXICET     TAKE these medications   atorvastatin 80 MG tablet Commonly known as:  LIPITOR Take  1 tablet (80 mg total) by mouth daily at 6 PM.   carvedilol 12.5 MG tablet Commonly known as:  COREG Take 1 tablet (12.5 mg total) by mouth 2 (two) times daily with a meal.   esomeprazole 40 MG capsule Commonly known as:  NEXIUM Take 40 mg by mouth daily before breakfast.   furosemide 20 MG tablet Commonly known as:  LASIX Take 20 mg by mouth every morning.   gabapentin 100 MG capsule Commonly known as:  NEURONTIN Take 100 mg by mouth 3 (three) times daily.   hydrochlorothiazide 25 MG tablet Commonly known as:  HYDRODIURIL Take 25 mg by mouth daily.   isosorbide mononitrate 30 MG 24 hr tablet Commonly known as:  IMDUR Take 1 tablet (30 mg total) by mouth daily.   multivitamin with minerals tablet Take 1 tablet by mouth daily.   naproxen 500 MG tablet Commonly known as:  NAPROSYN Take 500 mg by mouth daily.   nitroGLYCERIN 0.4 MG SL tablet Commonly known as:  NITROSTAT Place 1 tablet (0.4 mg total) under the tongue every 5 (five) minutes  x 3 doses as needed for chest pain.   nortriptyline 10 MG capsule Commonly known as:  PAMELOR Take 10 mg by mouth daily.   ticagrelor 90 MG Tabs tablet Commonly known as:  BRILINTA Take 1 tablet (90 mg total) by mouth 2 (two) times daily.         Diet and Activity recommendation: See Discharge Instructions above   Consults obtained - Cardiology   Major procedures and Radiology Reports - PLEASE review detailed and final reports for all details, in brief -      Dg Chest 2 View  Result Date: 06/26/2017 CLINICAL DATA:  71 year old female with chest pain. EXAM: CHEST  2 VIEW COMPARISON:  chest radiograph dated 04/30/2014 FINDINGS: Subsegmental left lung base atelectasis/ scarring. Pneumonia is not excluded. Clinical correlation is recommended. Mild bronchiectatic changes. The right lung is clear. There is no pleural effusion or pneumothorax. The cardiac silhouette is within normal limits. No acute osseous pathology. IMPRESSION:  Left lung base atelectasis/ scarring versus infiltrate. Clinical correlation is recommended. Electronically Signed   By: Anner Crete M.D.   On: 06/26/2017 22:07   Ct Angio Chest Pe W And/or Wo Contrast  Result Date: 06/27/2017 CLINICAL DATA:  Chest pain. Epidural for spinal problems performed earlier today. Tachycardia. EXAM: CT ANGIOGRAPHY CHEST WITH CONTRAST TECHNIQUE: Multidetector CT imaging of the chest was performed using the standard protocol during bolus administration of intravenous contrast. Multiplanar CT image reconstructions and MIPs were obtained to evaluate the vascular anatomy. CONTRAST:  75 mL Isovue 370 COMPARISON:  None. FINDINGS: Cardiovascular: Examination is technically limited by motion artifact and weak contrast bolus. Central and proximal segmental pulmonary arteries are moderately well opacified without filling defect identified. No evidence of significant pulmonary embolus. Normal caliber thoracic aorta. No evidence of dissection. Scattered aortic calcifications. Calcification in the coronary arteries. Normal heart size. No pericardial effusion. Mediastinum/Nodes: No enlarged mediastinal, hilar, or axillary lymph nodes. Thyroid gland, trachea, and esophagus demonstrate no significant findings. Lungs/Pleura: Evaluation is limited due to motion artifact. There is linear atelectasis in the lung bases. No consolidation or airspace disease is appreciated. No pleural effusions. No pneumothorax. Upper Abdomen: No acute abnormality. Musculoskeletal: Degenerative changes in the spine. No destructive bone lesions identified. Review of the MIP images confirms the above findings. IMPRESSION: 1. Technically limited study but no evidence of significant pulmonary embolus. 2. No evidence of active pulmonary disease. Linear atelectasis in the lung bases. 3. Aortic atherosclerosis. 4. Coronary artery calcifications. Electronically Signed   By: Lucienne Capers M.D.   On: 06/27/2017 01:57   Mr  Lumbar Spine Wo Contrast  Result Date: 06/11/2017 CLINICAL DATA:  Low back and left hip and lower extremity pain for approximately 2 months. No known injury. EXAM: MRI LUMBAR SPINE WITHOUT CONTRAST TECHNIQUE: Multiplanar, multisequence MR imaging of the lumbar spine was performed. No intravenous contrast was administered. COMPARISON:  CT abdomen and pelvis 02/25/2017. FINDINGS: Segmentation:  Standard. Alignment: There is convex left scoliosis. 0.3 cm facet mediated anterolisthesis L4 on L5 is identified. There is 0.5 cm retrolisthesis L5 on S1. Vertebrae: No fracture or worrisome lesion. Hemangioma in L3 is noted. Mild degenerative endplate signal change is seen at L4-5 and L5-S1. Conus medullaris: Extends to the L1-2 level and appears normal. Paraspinal and other soft tissues: T2 hyperintense inferior lesion lower pole right kidney is compatible with a cyst and was present on the prior CT. Disc levels: T11-12 and T12-L1 are imaged in the sagittal plane only and negative. L1-2: Minimal disc bulge  without stenosis. L2-3: Minimal disc bulge and mild facet degenerative disease without stenosis. L3-4: Short pedicle length, disc bulge and ligamentum flavum thickening cause moderately severe to severe central canal stenosis. Protruding disc in the right foramen contacts the exiting right L3 root. L4-5: Congenitally narrow central canal, disc bulge and ligamentum flavum thickening cause severe central canal stenosis. Disc and facet arthropathy cause moderately severe left foraminal narrowing. The right foramen is open. L5-S1: Shallow disc bulge endplate spur without central canal stenosis. Moderate to moderately severe bilateral foraminal narrowing is worse on the left. IMPRESSION: 1. 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 Protruding disc in the right foramen contacts the exiting right L3 root. 2. 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 Moderately severe left  foraminal narrowing is seen at this level. 3. Moderate to moderately severe bilateral foraminal narrowing at L5-S1 is worse on the left. Electronically Signed   By: Inge Rise M.D.   On: 06/11/2017 13:25    Micro Results   No results found for this or any previous visit (from the past 240 hour(s)).     Today   Subjective:   Lenaya Pietsch today has no headache,no chest abdominal pain,no new weakness tingling or numbness, feels much better wants to go home today.   Objective:   Blood pressure (!) 157/68, pulse 75, temperature 97.9 F (36.6 C), temperature source Oral, resp. rate 18, height 5\' 10"  (1.778 m), weight 113.4 kg (250 lb), SpO2 95 %.  No intake or output data in the 24 hours ending 07/01/17 1059  Exam Awake Alert, Oriented x 3, No new F.N deficits, Normal affect [place].AT,PERRAL Supple Neck,No JVD, No cervical lymphadenopathy appriciated.  Symmetrical Chest wall movement, Good air movement bilaterally, CTAB RRR,No Gallops,Rubs or new Murmurs, No Parasternal Heave +ve B.Sounds, Abd Soft, Non tender, No organomegaly appriciated, No rebound -guarding or rigidity. No Cyanosis, Clubbing or edema, No new Rash or bruise  Data Review   CBC w Diff:  Lab Results  Component Value Date   WBC 11.9 (H) 06/29/2017   HGB 13.7 06/29/2017   HGB 14.4 07/04/2015   HCT 40.9 06/29/2017   HCT 41.5 07/04/2015   PLT 306 06/29/2017   PLT 332 07/04/2015   LYMPHOPCT 12 06/26/2017   LYMPHOPCT 43.8 08/24/2012   MONOPCT 1 06/26/2017   MONOPCT 7.3 08/24/2012   EOSPCT 0 06/26/2017   EOSPCT 2.7 08/24/2012   BASOPCT 0 06/26/2017   BASOPCT 0.8 08/24/2012    CMP:  Lab Results  Component Value Date   NA 139 06/29/2017   NA 141 04/30/2014   K 4.0 06/29/2017   K 4.4 04/30/2014   CL 107 06/29/2017   CL 108 (H) 04/30/2014   CO2 23 06/29/2017   CO2 30 04/30/2014   BUN 23 (H) 06/29/2017   BUN 14 04/30/2014   CREATININE 0.96 06/29/2017   CREATININE 0.93 04/30/2014   PROT 6.8 01/09/2016    PROT 7.0 07/04/2015   PROT 7.3 08/24/2012   ALBUMIN 4.1 01/09/2016   ALBUMIN 4.4 07/04/2015   ALBUMIN 3.8 08/24/2012   BILITOT 0.6 01/09/2016   BILITOT 0.5 07/04/2015   BILITOT 0.4 08/24/2012   ALKPHOS 118 01/09/2016   ALKPHOS 130 08/24/2012   AST 28 01/09/2016   AST 32 08/24/2012   ALT 38 01/09/2016   ALT 64 08/24/2012  .   Total Time in preparing paper work, data evaluation and todays exam - 70 minutes  Blossie Raffel M.D on 06/29/2017 at 10:59 AM  Note: This dictation was prepared with Dragon dictation along with smaller phrase technology. Any transcriptional errors that result from this process are unintentional.

## 2017-07-01 NOTE — Telephone Encounter (Signed)
No answer. Left message to call back.   

## 2017-07-02 ENCOUNTER — Telehealth: Payer: Self-pay | Admitting: *Deleted

## 2017-07-02 NOTE — Telephone Encounter (Signed)
Patient states that she has been feeling better since being home. Confirmed her upcoming follow up appointment here in our office on 07/15/17 with Dr. Rockey Situ. She had no complaints and states that she has been doing ok since being home. She was appreciative for the call and had no further questions or concerns.

## 2017-07-02 NOTE — Telephone Encounter (Signed)
-----   Message from Minna Merritts, MD sent at 07/01/2017 10:15 PM EDT ----- Can we call her to ask her how she is feeling Is she having any symptoms or feeling somewhat better after stent? TG ----- Message ----- From: [place], Peter M, MD Sent: 07/01/2017   1:31 PM To: Jettie Booze, MD, #  Agree with Ulice Dash- looks promising  Collier [place] ----- Message ----- From: Jettie Booze, MD Sent: 06/30/2017   4:01 PM To: Rogelia Mire, NP, Sherren Mocha, MD, #  Ronalee Belts and Octavia Bruckner, THis lesion has several favorable characteristics.  We could certainly give it a try.  I am out next week.  She would just need hr medical therapy intensified and then see how her sx are doing.    JV ----- Message ----- From: Sherren Mocha, MD Sent: 06/29/2017  11:08 AM To: Jettie Booze, MD, #  This patient is post-PCI, Saturday discharge. I reviewed her films and suspect she will have recurrent angina based on the subtotally occluded RCA with normal/viable inferior wall. I told her medical therapy is appropriate as long as she's doing well but if recurrent angina I would send her to [facility] in [place] for CTO intervention of the RCA. I think there is a high probability of success based on the angiographic characteristics of the lesion.  She lives alone and has a lot of anxiety. Would be great if she could be seen sometime next week.  thx - Coop

## 2017-07-04 ENCOUNTER — Telehealth: Payer: Self-pay | Admitting: Cardiovascular Disease

## 2017-07-04 NOTE — Telephone Encounter (Signed)
Spoke with patient and she just wanted to see what she could take for her constipation and anxiety. She stated that she just has a ton of anxiety that even causes her to be short of breath. Advised her that we do not manage those here and she should check with her primary care physician. Reviewed some over the counter medications such as stool softeners and mirilax are sometimes useful for short term relief and that we don't prescribe anxiety medications. She verbalized understanding and confirmed her upcoming appointment on 07/17/17. She was appreciative for the call back and had no further concerns at this time.

## 2017-07-04 NOTE — Telephone Encounter (Signed)
Pt states she had a heart attack last week and wants to know why she is not on a baby aspirin. Please call.

## 2017-07-04 NOTE — Telephone Encounter (Signed)
Left voicemail message to call back  

## 2017-07-04 NOTE — Telephone Encounter (Signed)
Pt calling stating she is having issues with constipation   She is now by herself now.   Would like advise on this  Please call back

## 2017-07-04 NOTE — Telephone Encounter (Signed)
Spoke with patient and she wanted to know about taking a baby aspirin. Reviewed with her that she is currently on Brilinta therapy for her stent placement and she verbalized understanding with no further questions at this time.

## 2017-07-05 ENCOUNTER — Telehealth: Payer: Self-pay | Admitting: *Deleted

## 2017-07-05 NOTE — Telephone Encounter (Signed)
-----   Message from Rudi Coco sent at 07/05/2017  8:29 AM EDT ----- I don't have anything next week, I would have to add her on the wait list I have her down right now for 07/17/17 to see CB  ----- Message ----- From: Valora Corporal, RN Sent: 07/05/2017   7:44 AM To: Rudi Coco  Is there anyway to get this patient in to see Dr. Rockey Situ next week?   ----- Message ----- From: Jettie Booze, MD Sent: 06/30/2017   4:01 PM To: Rogelia Mire, NP, Sherren Mocha, MD, #  Ferne Reus, THis lesion has several favorable characteristics.  We could certainly give it a try.  I am out next week.  She would just need hr medical therapy intensified and then see how her sx are doing.    JV ----- Message ----- From: Sherren Mocha, MD Sent: 06/29/2017  11:08 AM To: Jettie Booze, MD, #  This patient is post-PCI, Saturday discharge. I reviewed her films and suspect she will have recurrent angina based on the subtotally occluded RCA with normal/viable inferior wall. I told her medical therapy is appropriate as long as she's doing well but if recurrent angina I would send her to [facility] in [place] for CTO intervention of the RCA. I think there is a high probability of success based on the angiographic characteristics of the lesion.  She lives alone and has a lot of anxiety. Would be great if she could be seen sometime next week.  thx - Coop

## 2017-07-05 NOTE — Telephone Encounter (Signed)
Left voicemail message for patient to call back for possible appointment on Monday 07/08/17

## 2017-07-05 NOTE — Telephone Encounter (Signed)
Patient rescheduled to see Ignacia Bayley NP on 07/08/17

## 2017-07-08 ENCOUNTER — Encounter: Payer: Self-pay | Admitting: Nurse Practitioner

## 2017-07-08 ENCOUNTER — Ambulatory Visit (INDEPENDENT_AMBULATORY_CARE_PROVIDER_SITE_OTHER): Payer: Medicare Other | Admitting: Nurse Practitioner

## 2017-07-08 VITALS — BP 138/80 | HR 78 | Ht 70.0 in | Wt 251.2 lb

## 2017-07-08 DIAGNOSIS — I214 Non-ST elevation (NSTEMI) myocardial infarction: Secondary | ICD-10-CM | POA: Diagnosis not present

## 2017-07-08 DIAGNOSIS — I2510 Atherosclerotic heart disease of native coronary artery without angina pectoris: Secondary | ICD-10-CM | POA: Diagnosis not present

## 2017-07-08 DIAGNOSIS — E781 Pure hyperglyceridemia: Secondary | ICD-10-CM | POA: Diagnosis not present

## 2017-07-08 DIAGNOSIS — I10 Essential (primary) hypertension: Secondary | ICD-10-CM | POA: Diagnosis not present

## 2017-07-08 MED ORDER — ASPIRIN EC 81 MG PO TBEC: 81.0000 mg | DELAYED_RELEASE_TABLET | Freq: Every day | ORAL | 3 refills | Status: DC

## 2017-07-08 NOTE — Patient Instructions (Signed)
Medication Instructions:  Your physician has recommended you make the following change in your medication:  START taking aspirin 81mg  once daily    Labwork: Lipid and liver profile in 6 weeks at the [facility] lab. No appointment necessary. Nothing to eat or drink after midnight the evening before your labs.   Testing/Procedures: none  Follow-Up: Your physician recommends that you schedule a follow-up appointment in: 2-3 months with Dr. Rockey Situ.    Any Other Special Instructions Will Be Listed Below (If Applicable).     If you need a refill on your cardiac medications before your next appointment, please call your pharmacy.

## 2017-07-08 NOTE — Progress Notes (Signed)
Office Visit    Patient Name: Carrie Larsen Date of Encounter: 07/08/2017  Primary Care Provider:  Lavonne Chick, MD Primary Cardiologist:  Johnny Bridge, MD   Chief Complaint    71 y/o ? with a h/o HTN, HL, renal cancer s/p partial L nephrectomy, colon CA, GERD, obesity, and recent NSTEMI, who presents for f/u.  Past Medical History    Past Medical History:  Diagnosis Date  . CAD (coronary artery disease)    a. 2012 ETT: no ischemia;  b. 06/2017 NSTEMI/PCI: LM nl, LAD nl, D1 70-80p, LCX nl, OM1 90 (3.0 x 23 Xience [place]), RCA dominant, 100p/m, fills via L->R collats, EF 55-65%.  . Cancer [facility]    a. s/p partial left nephrectomy.  . Colon cancer ([facility]) 1992   T3, N1, M0.  . Colon polyp 2011  . Diastolic dysfunction    a. 08/2015 Echo: EF 60-65%, no rwma, Gr1 DD, midlly dil LA, PASP 9mmHg. No significant valvular dzs; b. 06/2017 Echo: EF 60-65%, Gr1DD, mildly dil LA.  . Essential hypertension   . GERD (gastroesophageal reflux disease)   . Hyperlipidemia   . Lumbar spinal stenosis    Past Surgical History:  Procedure Laterality Date  . ABDOMINAL HYSTERECTOMY    . APPENDECTOMY    . BACK SURGERY    . CATARACT EXTRACTION W/PHACO Left 04/24/2016   Procedure: CATARACT EXTRACTION PHACO AND INTRAOCULAR LENS PLACEMENT (IOC);  Surgeon: Birder Robson, MD;  Location: [facility] ORS;  Service: Ophthalmology;  Laterality: Left;  [place] 45.8 AP% 16.4 CDE 7.53 FLUID PACK LOT # [id number]  . CATARACT EXTRACTION W/PHACO Right 06/05/2016   Procedure: CATARACT EXTRACTION PHACO AND INTRAOCULAR LENS PLACEMENT (IOC);  Surgeon: Birder Robson, MD;  Location: [facility] ORS;  Service: Ophthalmology;  Laterality: Right;  [place] 25.9 AP% 21.9 CDE 5.68 Fluid pack lot # [id number]  . COLON RESECTION  1992  . COLON SURGERY    . COLONOSCOPY  03-14-10   Dr Bary Castilla, tubular adenoma at 25 cm.  . COLONOSCOPY WITH PROPOFOL N/A 06/07/2015   Procedure: COLONOSCOPY WITH PROPOFOL;  Surgeon: Robert Bellow, MD;  Location[facility] END[facility]COPY;  Service: Endoscopy;  Laterality: N/A;  . CORONARY STENT INTERVENTION N/A 06/28/2017   Procedure: CORONARY STENT INTERVENTION;  Surgeon: Yolonda Kida, MD;  Location: [place] CV LAB;  Service: Cardiovascular;  Laterality: N/A;  . HERNIA REPAIR  [id number]   umbilical  . LEFT HEART CATH AND CORONARY ANGIOGRAPHY N/A 06/28/2017   Procedure: LEFT HEART CATH AND CORONARY ANGIOGRAPHY;  Surgeon: Minna Merritts, MD;  Location: [place] CV LAB;  Service: Cardiovascular;  Laterality: N/A;  . NEPHRECTOMY  1992   left  . RIB RESECTION     removal 4 ribs  . TONSILLECTOMY      Allergies  Allergies  Allergen Reactions  . Morphine And Related Nausea Only  . Latex Rash    History of Present Illness    71 y/o ? with the above complex PMH including HTN, HL, systolic murmur, renal cancer s/p partial L nephrectomy, colon cancer, GERD, and obesity.  She was recently dx with spinal stenosis @ L3-4, L4-5.  On 8/1, she was seen by ortho and underwent epidural.  Later in the day, she developed back, chest, and jaw pain.  She presented to [facility] where she r/i for NSTEMI.  CTA was neg for PE.  Echo showed nl EF.  She underwent cath revealing mod diag dzs, severe OM1 dzs, and a CTO of the  RCA.  The OM1 was felt to be the culprit and was successfully treated with a DES.  It was felt that if she had recurrent c/p, she could be evaluated for CTO PCI.    Since d/c, she has done well w/o chest pain or dyspnea.  She is walking some.  She has been compliant with her medications, though was not d/c'd on ASA.  She denies palpitations, pnd, orthopnea, n, v, dizziness, syncope, edema, weight gain, or early satiety.   Home Medications    Prior to Admission medications   Medication Sig Start Date End Date Taking? Authorizing Provider  atorvastatin (LIPITOR) 80 MG tablet Take 1 tablet (80 mg total) by mouth daily at 6 PM. 06/29/17  Yes Epifanio Lesches, MD  carvedilol (COREG) 12.5 MG tablet Take 1  tablet (12.5 mg total) by mouth 2 (two) times daily with a meal. 06/29/17  Yes Epifanio Lesches, MD  esomeprazole (NEXIUM) 40 MG capsule Take 40 mg by mouth daily before breakfast.     Yes [provider]  furosemide (LASIX) 20 MG tablet Take 20 mg by mouth every morning. 05/06/17  Yes [provider]  hydrochlorothiazide (HYDRODIURIL) 25 MG tablet Take 25 mg by mouth daily. 04/15/16  Yes [provider]  isosorbide mononitrate (IMDUR) 30 MG 24 hr tablet Take 1 tablet (30 mg total) by mouth daily. 06/30/17  Yes Epifanio Lesches, MD  Multiple Vitamins-Minerals (MULTIVITAMIN WITH MINERALS) tablet Take 1 tablet by mouth daily.   Yes [provider]  naproxen (NAPROSYN) 500 MG tablet Take 500 mg by mouth daily.   Yes [provider]  nitroGLYCERIN (NITROSTAT) 0.4 MG SL tablet Place 1 tablet (0.4 mg total) under the tongue every 5 (five) minutes x 3 doses as needed for chest pain. 06/29/17  Yes Epifanio Lesches, MD  nortriptyline (PAMELOR) 10 MG capsule Take 10 mg by mouth daily.  05/05/15  Yes [provider]  ticagrelor (BRILINTA) 90 MG TABS tablet Take 1 tablet (90 mg total) by mouth 2 (two) times daily. 06/29/17  Yes Epifanio Lesches, MD  aspirin EC 81 MG tablet Take 1 tablet (81 mg total) by mouth daily. 07/08/17   Rogelia Mire, NP    Review of Systems    She denies chest pain, palpitations, dyspnea, pnd, orthopnea, n, v, dizziness, syncope, edema, weight gain, or early satiety.  All other systems reviewed and are otherwise negative except as noted above.  Physical Exam    VS:  BP 138/80 (BP Location: Left Arm, Patient Position: Sitting, Cuff Size: Normal)   Pulse 78   Ht 5\' 10"  (1.778 m)   Wt 251 lb 4 oz (114 kg)   BMI 36.05 kg/m  , BMI Body mass index is 36.05 kg/m. GEN: Well nourished, well developed, in no acute distress.  HEENT: normal.  Neck: Supple, no JVD, carotid bruits, or masses. Cardiac: RRR, no murmurs, rubs,  or gallops. No clubbing, cyanosis, edema.  Radials/DP/PT 2+ and equal bilaterally.  Respiratory:  Respirations regular and unlabored, clear to auscultation bilaterally. GI: Soft, nontender, nondistended, BS + x 4. MS: no deformity or atrophy. Skin: warm and dry, no rash. Neuro:  Strength and sensation are intact. Psych: Normal affect.  Accessory Clinical Findings    ECG - RSR, 78, RBBB, infarct.  She had intermittent RBBB during hospitalization.  Assessment & Plan    1.  NSTEMI, subsequent episode of care/CAD:  S/p PCI/DES to the OM1. She has residual dzs in the D1 and a CTO  of the RCA.  She has been doing well w/o chest pain or dyspnea and remains on brilinta,  blocker, nitrate, and high potency statin therapy.  She was not d/c'd with ASA and I have added 81 mg daily today.  If she were to have recurrent chest pain, we could look to titrate nitrate therapy further but if that failed, the RCA was felt to be potentially amenable to CTO PCI by Drs. [place] and [place].  She is interested in cardiac rehab and plans to enroll.  2.  Essential HTN:  BP relatively stable. Cont  blocker, diuretic, and nitrate therapy.  3.  HL:  LDL 239  cont high potency statin therapy.  I suspect she will need PCSK9 inhibitor therapy.  Plan f/u lipids/lft's in 6 wks.  4.  Spinal Stenosis:  She has f/u with ortho in a few wks.  She will not be able to come off of DAPT for at least six months to a year.  5.  Morbid Obesity:  We discussed diet and exercise.  She will enroll in cardiac rehab.  6.  Dispo:  F/u lipids/lft's in 6 wks.  F/u with Dr. Rockey Situ in 2-3 mos.  Murray Hodgkins, NP 07/08/2017, 3:39 PM

## 2017-07-11 NOTE — Telephone Encounter (Signed)
Patient states that she was up the entire night and she feels nervous a lot. She started crying while telling me her story about not sleeping for the past 3 days and just needing some sleep. Advised her to check with PCP because we do not prescribe or manage that type of medications. She reports that she sees her PCP on Monday. In the meantime suggested that she check with her pharmacy about some over the counter options to try and see if that will help temporarily until she can see her PCP. She was very appreciative for my call back and has no further questions at this time.

## 2017-07-11 NOTE — Telephone Encounter (Signed)
Pt states she cannot sleep at all at night, and has cried all night. She  Would like something to help her sleep, and she stays nervous all the time. Please call.

## 2017-07-15 ENCOUNTER — Ambulatory Visit: Payer: Medicare Other | Admitting: Cardiovascular Disease

## 2017-07-16 ENCOUNTER — Telehealth: Payer: Self-pay | Admitting: Cardiovascular Disease

## 2017-07-16 NOTE — Telephone Encounter (Signed)
In the setting of recent MI and ongoing asa/brilinta Rx, we do prefer that she avoid NSAIDS.

## 2017-07-16 NOTE — Telephone Encounter (Signed)
Spoke with patient and she reports that the pharmacist alerted her that she may not need to Celebrex since she had her heart attack. So this caused her concern and she wants to know if she should in fact take this or not. Let her know that I would check on this for her and then call her back.

## 2017-07-16 NOTE — Telephone Encounter (Signed)
Left message for pt to call back  °

## 2017-07-16 NOTE — Telephone Encounter (Signed)
Spoke with patient and reviewed Ignacia Bayley NP's recommendations to avoid any NSAIDS to include the Celebrex. She verbalized understanding of instructions and had no further questions or concerns at this time. Instructed her to call back if we can assist any further.

## 2017-07-16 NOTE — Telephone Encounter (Signed)
Pt wants to know if it is ok that she takes Celebrex. Please call and advise.

## 2017-07-17 ENCOUNTER — Ambulatory Visit: Payer: Medicare Other | Admitting: Nurse Practitioner

## 2017-07-22 ENCOUNTER — Encounter: Payer: Self-pay | Admitting: Podiatry

## 2017-07-22 ENCOUNTER — Ambulatory Visit (INDEPENDENT_AMBULATORY_CARE_PROVIDER_SITE_OTHER): Payer: Medicare Other | Admitting: Podiatry

## 2017-07-22 DIAGNOSIS — Q828 Other specified congenital malformations of skin: Secondary | ICD-10-CM

## 2017-07-22 DIAGNOSIS — I2510 Atherosclerotic heart disease of native coronary artery without angina pectoris: Secondary | ICD-10-CM | POA: Diagnosis not present

## 2017-07-22 NOTE — Progress Notes (Signed)
She presents today chief complaint of painful calluses sub-fifth metatarsophalangeal joints lateral. She states her feet have started to go numb and are painful across the forefoot bilateral. She feels that this may be associated with her heart attack or even her chronic back pain problem.  Objective: Vital signs are stable she's alert and oriented 3 pulses are palpable. Salter porokeratosis plantar aspect bilateral foot graded right. No open lesions or wounds are noted. Actually seems much better than it usually does.  Assessment: Pain limb secondary to onychomycosis porokeratosis.  Plan: Debrided all reactive hyperkeratotic tissue.

## 2017-07-24 ENCOUNTER — Other Ambulatory Visit: Payer: Self-pay

## 2017-07-24 ENCOUNTER — Telehealth: Payer: Self-pay | Admitting: Cardiovascular Disease

## 2017-07-24 MED ORDER — ISOSORBIDE MONONITRATE ER 30 MG PO TB24: 30.0000 mg | ORAL_TABLET | Freq: Every day | ORAL | 0 refills | Status: DC

## 2017-07-24 MED ORDER — ATORVASTATIN CALCIUM 80 MG PO TABS: 80.0000 mg | ORAL_TABLET | Freq: Every day | ORAL | 0 refills | Status: DC

## 2017-07-24 MED ORDER — CARVEDILOL 12.5 MG PO TABS: 12.5000 mg | ORAL_TABLET | Freq: Two times a day (BID) | ORAL | 0 refills | Status: DC

## 2017-07-24 NOTE — Telephone Encounter (Signed)
[place] making Patient aware of samples and patient assistant form placed up front for pickup.   4 bottles of Brilinta 90MG  LOT# [id number] EXP: 11/2019

## 2017-07-24 NOTE — Telephone Encounter (Signed)
Requested Prescriptions   Signed Prescriptions Disp Refills  . carvedilol (COREG) 12.5 MG tablet 180 tablet 0    Sig: Take 1 tablet (12.5 mg total) by mouth 2 (two) times daily with a meal.    Authorizing Provider: Minna Merritts    Ordering User: Janan Ridge isosorbide mononitrate (IMDUR) 30 MG 24 hr tablet 90 tablet 0    Sig: Take 1 tablet (30 mg total) by mouth daily.    Authorizing Provider: Minna Merritts    Ordering User: Janan Ridge atorvastatin (LIPITOR) 80 MG tablet 90 tablet 0    Sig: Take 1 tablet (80 mg total) by mouth daily at 6 PM.    Authorizing Provider: Minna Merritts    Ordering User: Janan Ridge

## 2017-07-24 NOTE — Telephone Encounter (Signed)
Requested Prescriptions   Signed Prescriptions Disp Refills  . carvedilol (COREG) 12.5 MG tablet 180 tablet 0    Sig: Take 1 tablet (12.5 mg total) by mouth 2 (two) times daily with a meal.    Authorizing Provider: Minna Merritts    Ordering User: Janan Ridge isosorbide mononitrate (IMDUR) 30 MG 24 hr tablet 90 tablet 0    Sig: Take 1 tablet (30 mg total) by mouth daily.    Authorizing Provider: Minna Merritts    Ordering User: Janan Ridge atorvastatin (LIPITOR) 80 MG tablet 90 tablet 0    Sig: Take 1 tablet (80 mg total) by mouth daily at 6 PM.    Authorizing Provider: Minna Merritts    Ordering User: [place], [place] patient aware she had samples for Brilinta 90MG  up front for pick up and a patient assistant form. That if she needed more brilinta to call us and we would send in a refill.

## 2017-07-24 NOTE — Telephone Encounter (Signed)
Patient calling the office for samples of medication:   1.  What medication and dosage are you requesting samples for? Brilinta 90 mg po BID   2.  Are you currently out of this medication? Yes .   Patient would like information on medication assistance .  She was advised that we have applications we can help her with for assistance with drug company and also given the contact information for the medication management clinic.  Patient appreciative and tearful as she is worried about the cost of this medicine

## 2017-07-24 NOTE — Telephone Encounter (Signed)
°*  STAT* If patient is at the pharmacy, call can be transferred to refill team.   1. Which medications need to be refilled? (please list name of each medication and dose if known)   Brilinta 90 mg po BID   Carvedilol 12.5 mg po BID   Isosorbide 30 mg po q day  Lipitor 80 mg po q Day   2. Which pharmacy/location (including street and city if local pharmacy) is medication to be sent to? [facility] [place]   3. Do they need a 30 day or 90 day supply? 90  PER PATIENT NEED PA FOR REFILL she is out of medicine

## 2017-08-12 ENCOUNTER — Telehealth: Payer: Self-pay | Admitting: Cardiovascular Disease

## 2017-08-12 NOTE — Telephone Encounter (Signed)
Patient calling the office for samples of medication:   1.  What medication and dosage are you requesting samples for? Brilinta 90 mg  2.  Are you currently out of this medication? No, will be out on Wednesday

## 2017-08-12 NOTE — Telephone Encounter (Signed)
Samples placed up front.     2 bottles of Brilinta 90MG  NLG#[id number] EXP: 02/2020

## 2017-08-19 ENCOUNTER — Telehealth: Payer: Self-pay | Admitting: Cardiovascular Disease

## 2017-08-19 ENCOUNTER — Other Ambulatory Visit: Admission: RE | Admit: 2017-08-19 | Payer: Medicare Other | Source: Ambulatory Visit | Admitting: Nurse Practitioner

## 2017-08-19 NOTE — Telephone Encounter (Signed)
Pt came by office and dropped of paper work for [employer]   She is also asking for more samples of Brilinta  She states she only has two days worth left She would also like to know about maybe switching to Palvix   Please advise

## 2017-08-19 NOTE — Telephone Encounter (Signed)
Please see note below. Pt requesting samples of Brilinta . We are Currently out of Brilinta 90 mg samples. We are waiting to receive them by mail. Pt is interested in switching to Plavix please advise.

## 2017-08-20 NOTE — Telephone Encounter (Signed)
Ok to change to plavix 75 mg daily with aspirin 81 mg daily Company assistance for brilinta?

## 2017-08-20 NOTE — Telephone Encounter (Signed)
Left detailed voicemail message per release form that we do not have any samples at this time and that I am awaiting Dr. Donivan Scull response regarding switch to xarelto and that I would give her a call back with further instructions or if samples should come in and phone number to call back if any further questions.

## 2017-08-20 NOTE — Telephone Encounter (Signed)
Pt called to check status of Brilinta. Please call. She would like to switch to Plavix.

## 2017-08-21 NOTE — Telephone Encounter (Signed)
Spoke with patient and reviewed with her that I have some samples available for her to pick up and her assistance forms. She verbalized understanding and states that she needs assistance with filling out the forms. Let her know that I would get them filled out and to come by later to pick up the forms and samples. She was appreciative for the call and had no further questions.   Medication Samples have been provided to the patient.  Drug name: Brilinta       Strength: 90 mg        Qty: 4 bottles  LOT: [id number]  Exp.Date: 04/21

## 2017-08-23 ENCOUNTER — Telehealth: Payer: Self-pay | Admitting: Cardiovascular Disease

## 2017-08-23 NOTE — Telephone Encounter (Signed)
Spoke with patient and she states that she went to her doctor recently and he gave her some tylenol with codeine and she wanted to let us know. She wanted to know of risks associated with taking that and let her know that all medications have risks and benefits but that her primary care provider is aware and she should be able to take it as he prescribed and that if she had any questions to give them a call. She was appreciative for the call with no further questions at this time.

## 2017-08-23 NOTE — Telephone Encounter (Signed)
Patient was given an rx for pain medication with codeine  And she wants to clarify that it is ok to take

## 2017-08-27 ENCOUNTER — Other Ambulatory Visit: Payer: Self-pay | Admitting: *Deleted

## 2017-08-31 ENCOUNTER — Telehealth: Payer: Self-pay | Admitting: Nurse Practitioner

## 2017-08-31 NOTE — Telephone Encounter (Signed)
   Pt called to report that she is dizzy this morning - room spinning.  Worse with turning her head.  She has a h/o vertigo and has meclizine @ home.  She'd like to know if she can use it.  I advised that she may take meclizine as prescribed.  If dizziness does not improve with meclizine, she should present to [facility] ED for eval.  Caller verbalized understanding and was grateful for the call back.  Murray Hodgkins, NP 08/31/2017, 11:36 AM

## 2017-09-02 ENCOUNTER — Telehealth: Payer: Self-pay | Admitting: Cardiovascular Disease

## 2017-09-02 NOTE — Telephone Encounter (Signed)
Patient calling wanting samples for Brilinta.   We have provided her with samples a lot and also with 2 medication assistant forms.   Per 08/19/2017 she wanted to switch to Plavix 75MG  and looks like Dr. Rockey Situ was okay with that but not sure if the switch was ever made.

## 2017-09-02 NOTE — Telephone Encounter (Signed)
Patient calling the office for samples of medication:   1.  What medication and dosage are you requesting samples for? brilinta  2.  Are you currently out of this medication?  Will be out soon.

## 2017-09-02 NOTE — Telephone Encounter (Signed)
S/w patient. She said she received a letter from [employer] denying her assistance for the [place]. Patient has appointment with Dr. Rockey Situ on 09/11/17 and would like to discuss with Dr Rockey Situ prior to switching to plavix. Patient has enough Brilinta to get her through Saturday. I will provide 1 more bottle of Brilinta sample to get patient to next Wednesday. Sample left at front desk for pick up. Number for [employer] and [facility] provided to patient as well. She will research those supports as well.  Medication Samples have been provided to the patient.  Drug name: Brilinta       Strength: 90 mg        Qty: 1 bottle  LOT: [id number]  Exp.Date: 07/2019

## 2017-09-04 ENCOUNTER — Other Ambulatory Visit
Admission: RE | Admit: 2017-09-04 | Discharge: 2017-09-04 | Disposition: A | Discharge status: Home / Self Care | Payer: Medicare Other | Source: Ambulatory Visit | Attending: Nurse Practitioner | Admitting: Nurse Practitioner

## 2017-09-04 DIAGNOSIS — E781 Pure hyperglyceridemia: Secondary | ICD-10-CM | POA: Diagnosis present

## 2017-09-04 LAB — LIPID PANEL
Cholesterol: 193 mg/dL (ref 0–200)
HDL: 48 mg/dL (ref >40)
LDL Cholesterol: 89 mg/dL (ref 0–99)
Total CHOL/HDL Ratio: 4 RATIO
Triglycerides: 281 mg/dL — ABNORMAL HIGH (ref <150)
VLDL: 56 mg/dL — ABNORMAL HIGH (ref 0–40)

## 2017-09-04 LAB — HEPATIC FUNCTION PANEL
ALT: 28 U/L (ref 14–54)
AST: 29 U/L (ref 15–41)
Albumin: 4.6 g/dL (ref 3.5–5.0)
Alkaline Phosphatase: 122 U/L (ref 38–126)
Bilirubin, Direct: <0.1 mg/dL — ABNORMAL LOW (ref 0.1–0.5)
Total Bilirubin: 1.1 mg/dL (ref 0.3–1.2)
Total Protein: 8.2 g/dL — ABNORMAL HIGH (ref 6.5–8.1)

## 2017-09-06 ENCOUNTER — Telehealth: Payer: Self-pay | Admitting: Cardiovascular Disease

## 2017-09-06 NOTE — Telephone Encounter (Signed)
Pt calling stating since starting on Brilinta   She is feeling a lot more nervous, shaky, is crying a lot more than normal she states.  She knows she is on a whole lot of medications   But would like to know if there is something she can do until she comes to see [place] on Wednesday  Please advise.

## 2017-09-06 NOTE — Telephone Encounter (Signed)
Spoke with patient and she is very tearful and is audibly anxious. She has appointment on Monday with her PCP and then with [place] next week as well. She went on about her breathing, anxiety, started crying, and was overall just upset. Spoke with her at length and instructed her to take slow deep breaths. She started to calm down and discussed her contacting her PCP for her anxiety. Confirmed her appointments for next week and reviewed symptoms to monitor for that would require ED visit. She was appreciative for the call and had no further questions at this time.

## 2017-09-11 ENCOUNTER — Ambulatory Visit (INDEPENDENT_AMBULATORY_CARE_PROVIDER_SITE_OTHER): Payer: Medicare Other | Admitting: Cardiovascular Disease

## 2017-09-11 ENCOUNTER — Encounter: Payer: Self-pay | Admitting: Cardiovascular Disease

## 2017-09-11 VITALS — BP 140/80 | HR 73 | Ht 70.0 in | Wt 250.5 lb

## 2017-09-11 DIAGNOSIS — E782 Mixed hyperlipidemia: Secondary | ICD-10-CM | POA: Diagnosis not present

## 2017-09-11 DIAGNOSIS — E669 Obesity, unspecified: Secondary | ICD-10-CM | POA: Insufficient documentation

## 2017-09-11 DIAGNOSIS — I2510 Atherosclerotic heart disease of native coronary artery without angina pectoris: Secondary | ICD-10-CM | POA: Diagnosis not present

## 2017-09-11 DIAGNOSIS — I214 Non-ST elevation (NSTEMI) myocardial infarction: Secondary | ICD-10-CM | POA: Diagnosis not present

## 2017-09-11 DIAGNOSIS — I10 Essential (primary) hypertension: Secondary | ICD-10-CM | POA: Diagnosis not present

## 2017-09-11 MED ORDER — EZETIMIBE 10 MG PO TABS: 10.0000 mg | ORAL_TABLET | Freq: Every day | ORAL | 3 refills | Status: DC

## 2017-09-11 MED ORDER — CLOPIDOGREL BISULFATE 75 MG PO TABS: 75.0000 mg | ORAL_TABLET | Freq: Every day | ORAL | 3 refills | Status: DC

## 2017-09-11 NOTE — Patient Instructions (Addendum)
Medication Instructions:   When you run out of brilinta, We will change to plavix once a day with aspirin 81 mg daily  Please start zetia one a day for cholesterol Stay on lipitor   Medication Samples have been provided to the patient.  Drug name: Brilinta       Strength: 90 mg        Qty: 3 bottles  LOT: [id number]  Exp.Date: 04/21  Labwork:  No new labs needed  Testing/Procedures:  No further testing at this time   Follow-Up: It was a pleasure seeing you in the office today. Please call us if you have new issues that need to be addressed before your next appt.  [phone]  Your physician wants you to follow-up in: 6 months.  You will receive a reminder letter in the mail two months in advance. If you don't receive a letter, please call our office to schedule the follow-up appointment.  If you need a refill on your cardiac medications before your next appointment, please call your pharmacy.

## 2017-09-11 NOTE — Progress Notes (Signed)
Cardiology Office Note  Date:  09/11/2017   ID:  Carrie Larsen, Carrie Larsen 10-Apr-1946, MRN [id number]  PCP:  Lavonne Chick, MD   Chief Complaint  Patient presents with  . other    3 month follow up. Meds reviewed by the pt. verbally. "doing well."     HPI:  Carrie Larsen is a very pleasant 71 year old woman with a history of  Coronary artery disease PCI to an OM vessel Occluded RCA with collaterals from left to right hyperlipidemia,  colon cancer,  status post nephrectomy on the left, who presents for routine followup of her coronary artery disease, hyperlipidemia  Hospital admission August 2018 for [facility] records reviewed with the patient in detail Troponin up to 0.88 CT scan with no PE Cardiac catheterization with PCI of OM vessel There is occluded proximal to mid RCA with collaterals from left to right, appeared chronic  Echocardiogram with ejection fraction 55-65% Previous total cholesterol 324 Statin intolerance in the past Repeat lab work showing total cholesterol 193 on Lipitor 80 mg daily  She is watching her diet, following a low carbohydrate foods Working for home instead, at least 30 hours per week Denies any significant chest pain on exertion  Other past medical history She reports that she has tried several cholesterol medications and all of these have called myalgias. She recalls trying Lipitor, simvastatin, Crestor, possibly others   she did not try coenzyme Q10 . She reports that her boyfriend takes this .  She reports having an episode of left-sided numbness on her face 11/14/2013. Workup was negative. Including cardiac enzymes, basic metabolic panels, CBC, chest x-ray and urinalysis     PMH:   has a past medical history of CAD (coronary artery disease); Cancer ([place]); Colon cancer ([place]) (1992); Colon polyp (2011); Diastolic dysfunction; Essential hypertension; GERD (gastroesophageal reflux disease); Hyperlipidemia; and Lumbar spinal  stenosis.  PSH:    Past Surgical History:  Procedure Laterality Date  . ABDOMINAL HYSTERECTOMY    . APPENDECTOMY    . BACK SURGERY    . CATARACT EXTRACTION W/PHACO Left 04/24/2016   Procedure: CATARACT EXTRACTION PHACO AND INTRAOCULAR LENS PLACEMENT (IOC);  Surgeon: Birder Robson, MD;  Location: [facility] ORS;  Service: Ophthalmology;  Laterality: Left;  [place] 45.8 AP% 16.4 CDE 7.53 FLUID PACK LOT # [id number]  . CATARACT EXTRACTION W/PHACO Right 06/05/2016   Procedure: CATARACT EXTRACTION PHACO AND INTRAOCULAR LENS PLACEMENT (IOC);  Surgeon: Birder Robson, MD;  Location: [facility] ORS;  Service: Ophthalmology;  Laterality: Right;  [place] 25.9 AP% 21.9 CDE 5.68 Fluid pack lot # [id number] H  . COLON RESECTION  1992  . COLON SURGERY    . COLONOSCOPY  03-14-10   Dr Bary Castilla, tubular adenoma at 25 cm.  . COLONOSCOPY WITH PROPOFOL N/A 06/07/2015   Procedure: COLONOSCOPY WITH PROPOFOL;  Surgeon: Robert Bellow, MD;  Location[facility];  Service: Endoscopy;  Laterality: N/A;  . CORONARY STENT INTERVENTION N/A 06/28/2017   Procedure: CORONARY STENT INTERVENTION;  Surgeon: Yolonda Kida, MD;  Location: [place] [facility];  Service: Cardiovascular;  Laterality: N/A;  . HERNIA REPAIR  1607   umbilical  . LEFT HEART CATH AND CORONARY ANGIOGRAPHY N/A 06/28/2017   Procedure: LEFT HEART CATH AND CORONARY ANGIOGRAPHY;  Surgeon: Minna Merritts, MD;  Location: [facility];  Service: Cardiovascular;  Laterality: N/A;  . NEPHRECTOMY  1992   left  . RIB RESECTION     removal 4 ribs  . TONSILLECTOMY  Current Outpatient Prescriptions  Medication Sig Dispense Refill  . aspirin EC 81 MG tablet Take 1 tablet (81 mg total) by mouth daily. 90 tablet 3  . atorvastatin (LIPITOR) 80 MG tablet Take 1 tablet (80 mg total) by mouth daily at 6 PM. 90 tablet 0  . carvedilol (COREG) 12.5 MG tablet Take 1 tablet (12.5 mg total) by mouth 2 (two) times daily with a meal. 180 tablet 0  . esomeprazole  (NEXIUM) 40 MG capsule Take 40 mg by mouth daily before breakfast.      . furosemide (LASIX) 20 MG tablet Take 20 mg by mouth every morning.  0  . hydrochlorothiazide (HYDRODIURIL) 25 MG tablet Take 25 mg by mouth daily.    . isosorbide mononitrate (IMDUR) 30 MG 24 hr tablet Take 1 tablet (30 mg total) by mouth daily. 90 tablet 0  . Multiple Vitamins-Minerals (MULTIVITAMIN WITH MINERALS) tablet Take 1 tablet by mouth daily.    . nitroGLYCERIN (NITROSTAT) 0.4 MG SL tablet Place 1 tablet (0.4 mg total) under the tongue every 5 (five) minutes x 3 doses as needed for chest pain. 20 tablet 1  . clopidogrel (PLAVIX) 75 MG tablet Take 1 tablet (75 mg total) by mouth daily. 90 tablet 3  . ezetimibe (ZETIA) 10 MG tablet Take 1 tablet (10 mg total) by mouth daily. 90 tablet 3   No current facility-administered medications for this visit.      Allergies:   Morphine and related and Latex   Social History:  The patient  reports that she has never smoked. She has never used smokeless tobacco. She reports that she does not drink alcohol or use drugs.   Family History:   family history includes Cerebral aneurysm in her father; Colon cancer in her mother.    Review of Systems: Review of Systems  Constitutional: Negative.   Respiratory: Negative.   Cardiovascular: Negative.   Gastrointestinal: Negative.   Musculoskeletal: Negative.   Neurological: Negative.   Psychiatric/Behavioral: Negative.   All other systems reviewed and are negative.    PHYSICAL EXAM: VS:  BP 140/80 (BP Location: Left Arm, Patient Position: Sitting, Cuff Size: Normal)   Pulse 73   Ht 5\' 10"  (1.778 m)   Wt 250 lb 8 oz (113.6 kg)   BMI 35.94 kg/m  , BMI Body mass index is 35.94 kg/m. GEN: Well nourished, well developed, in no acute distress  HEENT: normal  Neck: no JVD, carotid bruits, or masses Cardiac: RRR; no murmurs, rubs, or gallops,no edema  Respiratory:  clear to auscultation bilaterally, normal work of  breathing GI: soft, nontender, nondistended, + BS MS: no deformity or atrophy  Skin: warm and dry, no rash Neuro:  Strength and sensation are intact Psych: euthymic mood, full affect    Recent Labs: 10/22/2016: TSH 2.592 06/29/2017: BUN 23; Creatinine, Ser 0.96; Hemoglobin 13.7; Platelets 306; Potassium 4.0; Sodium 139 09/04/2017: ALT 28    Lipid Panel Lab Results  Component Value Date   CHOL 193 09/04/2017   HDL 48 09/04/2017   LDLCALC 89 09/04/2017   TRIG 281 (H) 09/04/2017      Wt Readings from Last 3 Encounters:  09/11/17 250 lb 8 oz (113.6 kg)  07/08/17 251 lb 4 oz (114 kg)  06/28/17 250 lb (113.4 kg)       ASSESSMENT AND PLAN:  Non-STEMI (non-ST elevated myocardial infarction) ([place]) Appears to have recovered well from her non-STEMI She's not having anginal symptoms RCA is occluded No further testing at this  time Difficulty affording brilinta. She is requesting change to Plavix  Mixed hyperlipidemia Recommended that she start Zetia 10 mg daily with her Lipitor 80 Cholesterol goals discussed with her in detail  Essential hypertension Blood pressure mildly above goal, recommended she monitor blood pressure at home and call our office with numbers. No changes made to the medications.  Morbid obesity We have encouraged continued exercise, careful diet management in an effort to lose weight.   Disposition:   F/U  6 months   Total encounter time more than 25 minutes  Greater than 50% was spent in counseling and coordination of care with the patient   No orders of the defined types were placed in this encounter.    Signed, Esmond Plants, M.D., Ph.D. 09/11/2017  [place], [place]

## 2017-09-23 ENCOUNTER — Ambulatory Visit: Payer: Medicare Other | Admitting: Podiatry

## 2017-09-25 ENCOUNTER — Telehealth: Payer: Self-pay | Admitting: Cardiovascular Disease

## 2017-09-25 NOTE — Telephone Encounter (Signed)
Spoke with patient and she wanted to know if the combination of her medications could be causing her diarrhea. Reviewed with her that these do not typically cause this particular side effect and that if it should continue then she may want to check with her primary care provider. She verbalized understanding and has no further questions at this time.

## 2017-09-25 NOTE — Telephone Encounter (Signed)
Pt states she has excessive diarrhea and wonders if it is coming from the Plavix or Zetia. Please call and advise.

## 2017-11-25 ENCOUNTER — Telehealth: Payer: Self-pay | Admitting: Cardiovascular Disease

## 2017-11-25 NOTE — Telephone Encounter (Signed)
Pt c/o medication issue:  1. Name of Medication: Zetia   2. How are you currently taking this medication (dosage and times per day)? Stopped taking  3. Are you having a reaction (difficulty breathing--STAT)? Leg cramps  4. What is your medication issue? Patient thinks zetia was causing issues with leg cramps

## 2017-11-25 NOTE — Telephone Encounter (Signed)
I spoke with the patient. She states she was having significant leg cramps on zetia. She stopped this on 11/18/17 and these have completely resolved for her.   I advised her to stay off zetia at the present time. Will forward to Dr. Rockey Situ for further review and recommendations.  She is agreeable.

## 2017-12-10 NOTE — Telephone Encounter (Signed)
Patient calling back to let us know she is still waiting on someone to advise her on what to do   Please call

## 2017-12-10 NOTE — Telephone Encounter (Signed)
Spoke with patient and reviewed that we made Dr. Rockey Situ aware that she is not taking due to leg cramps. Advised for her to stay off medication and try to monitor her diet closely. She verbalized understanding with no further questions at this time. She also confirmed next appointment and reviewed that we would send her letter to call closer to that time. She was appreciative for the call back with no further concerns at this time.

## 2018-01-20 ENCOUNTER — Encounter (INDEPENDENT_AMBULATORY_CARE_PROVIDER_SITE_OTHER): Payer: Medicare Other | Admitting: Ophthalmology

## 2018-01-20 DIAGNOSIS — H35033 Hypertensive retinopathy, bilateral: Secondary | ICD-10-CM | POA: Diagnosis not present

## 2018-01-20 DIAGNOSIS — I10 Essential (primary) hypertension: Secondary | ICD-10-CM | POA: Diagnosis not present

## 2018-01-20 DIAGNOSIS — H59033 Cystoid macular edema following cataract surgery, bilateral: Secondary | ICD-10-CM

## 2018-01-20 DIAGNOSIS — H43813 Vitreous degeneration, bilateral: Secondary | ICD-10-CM | POA: Diagnosis not present

## 2018-01-20 DIAGNOSIS — D3131 Benign neoplasm of right choroid: Secondary | ICD-10-CM

## 2018-02-04 ENCOUNTER — Other Ambulatory Visit: Payer: Self-pay | Admitting: Family Medicine

## 2018-02-04 DIAGNOSIS — Z1239 Encounter for other screening for malignant neoplasm of breast: Secondary | ICD-10-CM

## 2018-02-26 ENCOUNTER — Telehealth: Payer: Self-pay | Admitting: Cardiovascular Disease

## 2018-02-26 NOTE — Telephone Encounter (Signed)
Patient has appointment on 03/11/18 with Dr Rockey Situ. This is patient's 6 month f/u. NSTEMI August 2018. Echo August 2018. Coronary Stent Intervention August 2018.  S/w patient. She is aware to keep upcoming appointment in order to proceed with clearance from Dr Rockey Situ. She says she is in a lot of pain from a pinched nerve in her back and she will make sure to keep the appointment because she knows she needs it for clearance. She was very [employer] attorney.

## 2018-02-26 NOTE — Telephone Encounter (Signed)
   [facility] Pre-operative Risk Assessment    Request for surgical clearance:  1. What type of surgery is being performed? L3-4, l4-5 Lumbar Laminectomy  2. When is this surgery scheduled?  Not listed  3. What type of clearance is required (medical clearance vs. Pharmacy clearance to hold med vs. Both)? Not listed  4. Are there any medications that need to be held prior to surgery and how long? Not listed  5. Practice name and name of physician performing surgery? [facility]  6. What is your office phone and fax number? [phone], fax [phone] 7.  Anesthesia type (None, local, MAC, general) ?  Not listed  Carrie Larsen 02/26/2018, 10:44 AM  _________________________________________________________________   (provider comments below)

## 2018-03-03 ENCOUNTER — Ambulatory Visit (INDEPENDENT_AMBULATORY_CARE_PROVIDER_SITE_OTHER): Payer: Medicare Other | Admitting: Cardiovascular Disease

## 2018-03-03 ENCOUNTER — Encounter (INDEPENDENT_AMBULATORY_CARE_PROVIDER_SITE_OTHER): Payer: Medicare Other | Admitting: Ophthalmology

## 2018-03-03 ENCOUNTER — Encounter: Payer: Self-pay | Admitting: Cardiovascular Disease

## 2018-03-03 ENCOUNTER — Ambulatory Visit: Payer: Medicare Other | Admitting: Cardiovascular Disease

## 2018-03-03 VITALS — BP 142/80 | HR 78 | Ht 70.0 in | Wt 250.0 lb

## 2018-03-03 DIAGNOSIS — I25118 Atherosclerotic heart disease of native coronary artery with other forms of angina pectoris: Secondary | ICD-10-CM

## 2018-03-03 DIAGNOSIS — R011 Cardiac murmur, unspecified: Secondary | ICD-10-CM

## 2018-03-03 DIAGNOSIS — I10 Essential (primary) hypertension: Secondary | ICD-10-CM | POA: Diagnosis not present

## 2018-03-03 DIAGNOSIS — I214 Non-ST elevation (NSTEMI) myocardial infarction: Secondary | ICD-10-CM

## 2018-03-03 DIAGNOSIS — E782 Mixed hyperlipidemia: Secondary | ICD-10-CM

## 2018-03-03 NOTE — Progress Notes (Signed)
Cardiology Office Note  Date:  03/03/2018   ID:  Clara Herbison [place], [place] 05-26-1946, MRN [id number]  PCP:  Lavonne Chick, MD   Chief Complaint  Patient presents with  . Other    Patient needs clearance for L3-4, l4-5 Lumbar Laminectomy. Meds reviewed verbally with patient.     HPI:  Ms. 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 Trenton Gammon Scheduled in the near future  8+ months since stent placed Denies any chest pain, SOB on exertion  Works 10 hours a day Currently taking some time off  Difficulty tolerating  Zetia 10 mg daily Some stomach upset Willing to try lower dose  EKG personally reviewed by myself on todays visit Shows normal sinus rhythm with rate 78 bpm left axis deviation  Other past medical history reviewed Hospital admission August 2018 for non-STEMI Troponin up to 0.88 CT scan with no PE Cardiac catheterization with PCI of OM vessel There is occluded proximal to mid RCA with collaterals from left to right, appeared chronic  Echocardiogram with ejection fraction 55-65% Previous total cholesterol 324 Statin intolerance in the past Repeat lab work showing total cholesterol 193 on Lipitor 80 mg daily  She reports that she has tried several cholesterol medications and all of these have called myalgias. She recalls trying Lipitor, simvastatin, Crestor, possibly others   she did not try coenzyme Q10 . She reports that her boyfriend takes this .  She reports having an episode of left-sided numbness on her face 11/14/2013. Workup was negative. Including cardiac enzymes, basic metabolic panels, CBC, chest x-ray and  urinalysis   PMH:   has a past medical history of CAD (coronary artery disease), Cancer ([place]), Colon cancer ([place]) (1992), Colon polyp (6283), Diastolic dysfunction, Essential hypertension, GERD (gastroesophageal reflux disease), Hyperlipidemia, and Lumbar spinal stenosis.  PSH:    Past Surgical History:  Procedure Laterality Date  . ABDOMINAL HYSTERECTOMY    . APPENDECTOMY    . BACK SURGERY    . CATARACT EXTRACTION W/PHACO Left 04/24/2016   Procedure: CATARACT EXTRACTION PHACO AND INTRAOCULAR LENS PLACEMENT (IOC);  Surgeon: Birder Robson, MD;  Location: [facility] ORS;  Service: Ophthalmology;  Laterality: Left;  [place] 45.8 AP% 16.4 CDE 7.53 FLUID PACK LOT # [id number] H  . CATARACT EXTRACTION W/PHACO Right 06/05/2016   Procedure: CATARACT EXTRACTION PHACO AND INTRAOCULAR LENS PLACEMENT (IOC);  Surgeon: Birder Robson, MD;  Location: [facility] ORS;  Service: Ophthalmology;  Laterality: Right;  [place] 25.9 AP% 21.9 CDE 5.68 Fluid pack lot # [id number] H  . COLON RESECTION  1992  . COLON SURGERY    . COLONOSCOPY  03-14-10   Dr Bary Castilla, tubular adenoma at 25 cm.  . COLONOSCOPY WITH PROPOFOL N/A 06/07/2015   Procedure: COLONOSCOPY WITH PROPOFOL;  Surgeon: Robert Bellow, MD;  Location: [facility] ENDOSCOPY;  Service: Endoscopy;  Laterality: N/A;  . CORONARY STENT INTERVENTION N/A 06/28/2017   Procedure: CORONARY STENT INTERVENTION;  Surgeon: Yolonda Kida, MD;  Location: [place] [facility];  Service: Cardiovascular;  Laterality: N/A;  . HERNIA REPAIR  0737   umbilical  . LEFT HEART CATH AND CORONARY ANGIOGRAPHY N/A 06/28/2017   Procedure: LEFT HEART CATH AND CORONARY  ANGIOGRAPHY;  Surgeon: Minna Merritts, MD;  Location: [place] [facility];  Service: Cardiovascular;  Laterality: N/A;  . NEPHRECTOMY  1992   left  . RIB RESECTION     removal 4 ribs  . TONSILLECTOMY      Current Outpatient Medications  Medication Sig Dispense Refill  . aspirin EC 81 MG tablet Take 1 tablet (81 mg total) by mouth  daily. 90 tablet 3  . atorvastatin (LIPITOR) 80 MG tablet Take 1 tablet (80 mg total) by mouth daily at 6 PM. 90 tablet 0  . carvedilol (COREG) 12.5 MG tablet Take 1 tablet (12.5 mg total) by mouth 2 (two) times daily with a meal. 180 tablet 0  . clopidogrel (PLAVIX) 75 MG tablet Take 1 tablet (75 mg total) by mouth daily. 90 tablet 3  . esomeprazole (NEXIUM) 40 MG capsule Take 40 mg by mouth daily before breakfast.      . furosemide (LASIX) 20 MG tablet Take 20 mg by mouth every morning.  0  . hydrochlorothiazide (HYDRODIURIL) 25 MG tablet Take 25 mg by mouth daily.    . isosorbide mononitrate (IMDUR) 30 MG 24 hr tablet Take 1 tablet (30 mg total) by mouth daily. 90 tablet 0  . Multiple Vitamins-Minerals (MULTIVITAMIN WITH MINERALS) tablet Take 1 tablet by mouth daily.    . nitroGLYCERIN (NITROSTAT) 0.4 MG SL tablet Place 1 tablet (0.4 mg total) under the tongue every 5 (five) minutes x 3 doses as needed for chest pain. 20 tablet 1   No current facility-administered medications for this visit.      Allergies:   Morphine and related and Latex   Social History:  The patient  reports that she has never smoked. She has never used smokeless tobacco. She reports that she does not drink alcohol or use drugs.   Family History:   family history includes Cerebral aneurysm in her father; Colon cancer in her mother.    Review of Systems: Review of Systems  Constitutional: Negative.   Respiratory: Negative.   Cardiovascular: Negative.   Gastrointestinal: Negative.   Musculoskeletal: Positive for back pain.  Neurological: Negative.   Psychiatric/Behavioral: Negative.   All other systems reviewed and are negative.    PHYSICAL EXAM: VS:  BP (!) 142/80 (BP Location: Left Arm, Patient Position: Sitting, Cuff Size: Normal)   Pulse 78   Ht 5\' 10"  (1.778 m)   Wt 250 lb (113.4 kg)   BMI 35.87 kg/m  , BMI Body mass index is 35.87 kg/m. Constitutional:  oriented to person, place, and time. No  distress.  HENT:  Head: Normocephalic and atraumatic.  Eyes:  no discharge. No scleral icterus.  Neck: Normal range of motion. Neck supple. No JVD present.  Cardiovascular: Normal rate, regular rhythm, normal heart sounds and intact distal pulses. Exam reveals no gallop and no friction rub. No edema No murmur heard. Pulmonary/Chest: Effort normal and breath sounds normal. No stridor. No respiratory distress.  no wheezes.  no rales.  no tenderness.  Abdominal: Soft.  no distension.  no tenderness.  Musculoskeletal: Normal range of motion.  no  tenderness or deformity.  Neurological:  normal muscle tone. Coordination normal. No atrophy Skin: Skin is warm and dry. No rash noted. not diaphoretic.  Psychiatric:  normal mood and affect. behavior is normal. Thought content normal.    Recent Labs: 06/29/2017: BUN 23; Creatinine, Ser 0.96; Hemoglobin 13.7; Platelets 306; Potassium 4.0; Sodium 139 09/04/2017: ALT 28    Lipid Panel Lab Results  Component Value Date   CHOL 193 09/04/2017   HDL 48 09/04/2017   LDLCALC 89 09/04/2017   TRIG 281 (H) 09/04/2017      Wt Readings from Last 3 Encounters:  03/03/18 250 lb (113.4 kg)  09/11/17 250 lb 8 oz (113.6 kg)  07/08/17 251 lb 4 oz (114 kg)       ASSESSMENT AND PLAN:  Non-STEMI (non-ST elevated myocardial infarction) (HCC) She's not having anginal symptoms Needs cortisone injection, "I cannot wait any longer" Discussed risk and benefit of stopping Plavix She is agreeable to stopping Plavix before the procedure Would recommend she try to stay on aspirin 81 mg daily Restart Plavix after the procedure is complete  Mixed hyperlipidemia She is currently on Lipitor 80 LDL above goal She will retry low-dose Zetia 5 mg daily  Essential hypertension Blood pressure is well controlled on today's visit. No changes made to the medications. Stable  Morbid obesity We have encouraged continued unable to exercise careful diet management in an  effort to lose weight.  Unable to exercise   Disposition:   F/U  12 months   Total encounter time more than 25 minutes  Greater than 50% was spent in counseling and coordination of care with the patient    Orders Placed This Encounter  Procedures  . EKG 12-Lead     Signed, Esmond Plants, M.D., Ph.D. 03/03/2018  [place], [place]

## 2018-03-03 NOTE — Telephone Encounter (Signed)
Ok to have surgery She can hold plavix before back surgery She is aware of risk and benefit of stopping plavix,  she does not to wait on the back surgery, would like to proceed asap with cortisone injection

## 2018-03-03 NOTE — Patient Instructions (Signed)
Medication Instructions:   Please retry zetia  Daily  Ok to hold plavix 5 days prior to back procedure Stay on aspirin if possible  Labwork:  No new labs needed  Testing/Procedures:  No further testing at this time   Follow-Up: It was a pleasure seeing you in the office today. Please call us if you have new issues that need to be addressed before your next appt.  [phone]  Your physician wants you to follow-up in: 12 months.  You will receive a reminder letter in the mail two months in advance. If you don't receive a letter, please call our office to schedule the follow-up appointment.  If you need a refill on your cardiac medications before your next appointment, please call your pharmacy.  For educational health videos Log in to : [email or web address] Or : [email or web address], password : [email or web address]

## 2018-03-04 ENCOUNTER — Encounter (INDEPENDENT_AMBULATORY_CARE_PROVIDER_SITE_OTHER): Payer: Medicare Other | Admitting: Ophthalmology

## 2018-03-04 NOTE — Telephone Encounter (Signed)
Please clarify how many days patient may hold plavix for. Thank you so much!  Routing to Dr Rockey Situ.

## 2018-03-04 NOTE — Telephone Encounter (Signed)
5 days (or more ) depending on what they need

## 2018-03-05 ENCOUNTER — Encounter (INDEPENDENT_AMBULATORY_CARE_PROVIDER_SITE_OTHER): Payer: Medicare Other | Admitting: Ophthalmology

## 2018-03-05 ENCOUNTER — Other Ambulatory Visit: Payer: Self-pay | Admitting: Neurosurgery

## 2018-03-05 DIAGNOSIS — H26493 Other secondary cataract, bilateral: Secondary | ICD-10-CM | POA: Diagnosis not present

## 2018-03-05 DIAGNOSIS — H59033 Cystoid macular edema following cataract surgery, bilateral: Secondary | ICD-10-CM | POA: Diagnosis not present

## 2018-03-05 DIAGNOSIS — H35033 Hypertensive retinopathy, bilateral: Secondary | ICD-10-CM

## 2018-03-05 DIAGNOSIS — I10 Essential (primary) hypertension: Secondary | ICD-10-CM

## 2018-03-05 DIAGNOSIS — H43813 Vitreous degeneration, bilateral: Secondary | ICD-10-CM | POA: Diagnosis not present

## 2018-03-05 DIAGNOSIS — D3131 Benign neoplasm of right choroid: Secondary | ICD-10-CM

## 2018-03-05 NOTE — Telephone Encounter (Signed)
No answer. Left detail message, ok per DPR, and to call back if any questions.

## 2018-03-05 NOTE — Telephone Encounter (Signed)
No answer. Left detail message with recommendations, ok per DPR, and to call back if any questions.  Clearance routed to number provided via Epic fax.

## 2018-03-05 NOTE — Telephone Encounter (Signed)
Patient returning call - will be in another doctors appt most of the morning - has been unable to listen to messages

## 2018-03-07 ENCOUNTER — Encounter (HOSPITAL_COMMUNITY): Payer: Self-pay | Admitting: *Deleted

## 2018-03-07 ENCOUNTER — Other Ambulatory Visit: Payer: Self-pay | Admitting: Neurosurgery

## 2018-03-07 ENCOUNTER — Other Ambulatory Visit: Payer: Self-pay

## 2018-03-07 NOTE — Progress Notes (Signed)
Anesthesia Chart Review:  Pt is a 72 year old female scheduled for L3-4, L4-5 laminectomy and foraminotomy on 03/10/2018 with Earnie Larsson, MD  - PCP is Estell Harpin, MD - Cardiologist is Ida Rogue, MD who cleared pt for surgery at last office visit 03/03/18  PMH includes:  CAD (DES to OM1, occluded RCA with L->R collaterals 06/28/17), HTN, hyperlipidemia, renal cancer (s/p partial L nephrectomy), GERD. Never smoker. BMI 36  Medications include: ASA 81mg , lipitor, carvedilol, plavix, nexium, lasix, hctz, imdur. Pt to hold plavix 5 days before surgery. Pt to continue ASA perioperatively.   Labs will be obtained day of surgery  CXR 06/26/17:  Left lung base atelectasis/ scarring versus infiltrate. Clinical correlation is recommended  EKG 03/03/18: NSR. Minimal voltage criteria for LVH, may be normal variant. Inferior infarct, age undetermined.   Cardiac cath 06/28/17:   1st Mrg lesion, 90 %stenosed. S/p DES to OM1  1st Diag-2 lesion, 75 %stenosed.  1st Diag-1 lesion, 80 %stenosed, calcified and tortuous  Mid RCA lesion, 100 %stenosed; likely CTO with left to right collaterals  Prox LAD lesion, 35 %stenosed.  The left ventricular ejection fraction is 55-65% by visual estimate.  The left ventricular systolic function is normal.  Pt with PCI/DES last August.  Pt has cardiac clearance from Dr. Rockey Situ, is holding plavix but continuing ASA perioperatively. If labs acceptable day of surgery, I anticipate pt can proceed with surgery as scheduled.   Willeen Cass, FNP-BC [facility] Short Stay [facility]/Anesthesiology Phone: [phone] 03/07/2018 12:06 PM

## 2018-03-09 NOTE — Anesthesia Preprocedure Evaluation (Addendum)
Anesthesia Evaluation  Patient identified by MRN, date of birth, ID band Patient awake    Reviewed: Allergy & Precautions, NPO status , Patient's Chart, lab work & pertinent test results  Airway Mallampati: II  TM Distance: >3 FB Neck ROM: Full    Dental  (+) Dental Advisory Given   Pulmonary neg pulmonary ROS,    breath sounds clear to auscultation       Cardiovascular hypertension, Pt. on medications and Pt. on home beta blockers + CAD, + Past MI and + Cardiac Stents   Rhythm:Regular Rate:Normal     Neuro/Psych negative neurological ROS     GI/Hepatic Neg liver ROS, GERD  ,  Endo/Other  negative endocrine ROS  Renal/GU negative Renal ROS     Musculoskeletal   Abdominal   Peds  Hematology negative hematology ROS (+)   Anesthesia Other Findings   Reproductive/Obstetrics                            Lab Results  Component Value Date   WBC 11.9 (H) 06/29/2017   HGB 13.7 06/29/2017   HCT 40.9 06/29/2017   MCV 87.9 06/29/2017   PLT 306 06/29/2017   Lab Results  Component Value Date   CREATININE 0.96 06/29/2017   BUN 23 (H) 06/29/2017   NA 139 06/29/2017   K 4.0 06/29/2017   CL 107 06/29/2017   CO2 23 06/29/2017    Anesthesia Physical Anesthesia Plan  ASA: III  Anesthesia Plan: General   Post-op Pain Management:    Induction: Intravenous  PONV Risk Score and Plan: 3 and Dexamethasone, Ondansetron and Treatment may vary due to age or medical condition  Airway Management Planned: Oral ETT  Additional Equipment:   Intra-op Plan:   Post-operative Plan: Extubation in OR  Informed Consent: I have reviewed the patients History and Physical, chart, labs and discussed the procedure including the risks, benefits and alternatives for the proposed anesthesia with the patient or authorized representative who has indicated his/her understanding and acceptance.   Dental advisory  given  Plan Discussed with: CRNA  Anesthesia Plan Comments:        Anesthesia Quick Evaluation

## 2018-03-10 ENCOUNTER — Ambulatory Visit (HOSPITAL_COMMUNITY)
Admission: RE | Disposition: A | Discharge status: Home / Self Care | Payer: Self-pay | Source: Ambulatory Visit | Attending: Neurosurgery

## 2018-03-10 ENCOUNTER — Inpatient Hospital Stay (HOSPITAL_COMMUNITY): Payer: Medicare Other | Admitting: Emergency Medicine

## 2018-03-10 ENCOUNTER — Observation Stay (HOSPITAL_COMMUNITY)
Admission: RE | Admit: 2018-03-10 | Discharge: 2018-03-11 | Disposition: A | Discharge status: Home / Self Care | Payer: Medicare Other | Source: Ambulatory Visit | Attending: Neurosurgery | Admitting: Neurosurgery

## 2018-03-10 ENCOUNTER — Encounter (HOSPITAL_COMMUNITY): Payer: Self-pay | Admitting: *Deleted

## 2018-03-10 ENCOUNTER — Observation Stay (HOSPITAL_COMMUNITY): Payer: Medicare Other

## 2018-03-10 ENCOUNTER — Other Ambulatory Visit: Payer: Self-pay

## 2018-03-10 DIAGNOSIS — Z7982 Long term (current) use of aspirin: Secondary | ICD-10-CM | POA: Diagnosis not present

## 2018-03-10 DIAGNOSIS — Z800 Family history of malignant neoplasm of digestive organs: Secondary | ICD-10-CM | POA: Insufficient documentation

## 2018-03-10 DIAGNOSIS — Z85528 Personal history of other malignant neoplasm of kidney: Secondary | ICD-10-CM | POA: Diagnosis not present

## 2018-03-10 DIAGNOSIS — Z85038 Personal history of other malignant neoplasm of large intestine: Secondary | ICD-10-CM | POA: Insufficient documentation

## 2018-03-10 DIAGNOSIS — Z885 Allergy status to narcotic agent status: Secondary | ICD-10-CM | POA: Insufficient documentation

## 2018-03-10 DIAGNOSIS — Z905 Acquired absence of kidney: Secondary | ICD-10-CM | POA: Insufficient documentation

## 2018-03-10 DIAGNOSIS — Z8249 Family history of ischemic heart disease and other diseases of the circulatory system: Secondary | ICD-10-CM | POA: Diagnosis not present

## 2018-03-10 DIAGNOSIS — Z955 Presence of coronary angioplasty implant and graft: Secondary | ICD-10-CM | POA: Insufficient documentation

## 2018-03-10 DIAGNOSIS — Z79899 Other long term (current) drug therapy: Secondary | ICD-10-CM | POA: Diagnosis not present

## 2018-03-10 DIAGNOSIS — E785 Hyperlipidemia, unspecified: Secondary | ICD-10-CM | POA: Insufficient documentation

## 2018-03-10 DIAGNOSIS — M48062 Spinal stenosis, lumbar region with neurogenic claudication: Principal | ICD-10-CM | POA: Insufficient documentation

## 2018-03-10 DIAGNOSIS — I2510 Atherosclerotic heart disease of native coronary artery without angina pectoris: Secondary | ICD-10-CM | POA: Diagnosis not present

## 2018-03-10 DIAGNOSIS — I10 Essential (primary) hypertension: Secondary | ICD-10-CM | POA: Diagnosis not present

## 2018-03-10 DIAGNOSIS — K219 Gastro-esophageal reflux disease without esophagitis: Secondary | ICD-10-CM | POA: Diagnosis not present

## 2018-03-10 DIAGNOSIS — Z91040 Latex allergy status: Secondary | ICD-10-CM | POA: Insufficient documentation

## 2018-03-10 DIAGNOSIS — Z9841 Cataract extraction status, right eye: Secondary | ICD-10-CM | POA: Insufficient documentation

## 2018-03-10 DIAGNOSIS — I252 Old myocardial infarction: Secondary | ICD-10-CM | POA: Insufficient documentation

## 2018-03-10 DIAGNOSIS — Z419 Encounter for procedure for purposes other than remedying health state, unspecified: Secondary | ICD-10-CM

## 2018-03-10 DIAGNOSIS — Z87442 Personal history of urinary calculi: Secondary | ICD-10-CM | POA: Insufficient documentation

## 2018-03-10 DIAGNOSIS — Z90710 Acquired absence of both cervix and uterus: Secondary | ICD-10-CM | POA: Diagnosis not present

## 2018-03-10 DIAGNOSIS — Z9842 Cataract extraction status, left eye: Secondary | ICD-10-CM | POA: Diagnosis not present

## 2018-03-10 HISTORY — PX: LUMBAR LAMINECTOMY/DECOMPRESSION MICRODISCECTOMY: SHX5026

## 2018-03-10 HISTORY — DX: Acute myocardial infarction, unspecified: I21.9

## 2018-03-10 HISTORY — DX: Personal history of urinary calculi: Z87.442

## 2018-03-10 HISTORY — DX: Other retinoschisis and retinal cysts, unspecified eye: H33.199

## 2018-03-10 LAB — BASIC METABOLIC PANEL
Anion gap: 12 (ref 5–15)
BUN: 21 mg/dL — ABNORMAL HIGH (ref 6–20)
CO2: 27 mmol/L (ref 22–32)
Calcium: 9.7 mg/dL (ref 8.9–10.3)
Chloride: 102 mmol/L (ref 101–111)
Creatinine, Ser: 0.94 mg/dL (ref 0.44–1.00)
GFR calc Af Amer: >60 mL/min (ref >60)
GFR calc non Af Amer: 59 mL/min — ABNORMAL LOW (ref >60)
Glucose, Bld: 108 mg/dL — ABNORMAL HIGH (ref 65–99)
Potassium: 3.5 mmol/L (ref 3.5–5.1)
Sodium: 141 mmol/L (ref 135–145)

## 2018-03-10 LAB — CBC WITH DIFFERENTIAL/PLATELET
Basophils Absolute: 0 10*3/uL (ref 0.0–0.1)
Basophils Relative: 0 %
Eosinophils Absolute: 0.1 10*3/uL (ref 0.0–0.7)
Eosinophils Relative: 1 %
HCT: 44.5 % (ref 36.0–46.0)
Hemoglobin: 14.3 g/dL (ref 12.0–15.0)
Lymphocytes Relative: 27 %
Lymphs Abs: 2.6 10*3/uL (ref 0.7–4.0)
MCH: 28.5 pg (ref 26.0–34.0)
MCHC: 32.1 g/dL (ref 30.0–36.0)
MCV: 88.8 fL (ref 78.0–100.0)
Monocytes Absolute: 0.5 10*3/uL (ref 0.1–1.0)
Monocytes Relative: 5 %
Neutro Abs: 6.5 10*3/uL (ref 1.7–7.7)
Neutrophils Relative %: 67 %
Platelets: 331 10*3/uL (ref 150–400)
RBC: 5.01 MIL/uL (ref 3.87–5.11)
RDW: 14.2 % (ref 11.5–15.5)
WBC: 9.8 10*3/uL (ref 4.0–10.5)

## 2018-03-10 SURGERY — LUMBAR LAMINECTOMY/DECOMPRESSION MICRODISCECTOMY 2 LEVELS
Anesthesia: General | Site: Back | Laterality: Bilateral

## 2018-03-10 MED ORDER — KETOROLAC TROMETHAMINE 30 MG/ML IJ SOLN
INTRAMUSCULAR | Status: DC | PRN
  Administered 2018-03-10: 30 mg via INTRAVENOUS

## 2018-03-10 MED ORDER — ARTIFICIAL TEARS OPHTHALMIC OINT
TOPICAL_OINTMENT | OPHTHALMIC | Status: DC | PRN
  Administered 2018-03-10: 1 via OPHTHALMIC

## 2018-03-10 MED ORDER — PHENOL 1.4 % MT LIQD: 1.0000 | OROMUCOSAL | Status: DC | PRN

## 2018-03-10 MED ORDER — ROCURONIUM BROMIDE 10 MG/ML (PF) SYRINGE
PREFILLED_SYRINGE | INTRAVENOUS | Status: AC
  Filled 2018-03-10: qty 10

## 2018-03-10 MED ORDER — HYDROCODONE-ACETAMINOPHEN 5-325 MG PO TABS
1.0000 | ORAL_TABLET | ORAL | Status: DC | PRN
  Administered 2018-03-10 – 2018-03-11 (×2): 1 via ORAL
  Filled 2018-03-10: qty 1

## 2018-03-10 MED ORDER — FENTANYL CITRATE (PF) 250 MCG/5ML IJ SOLN
INTRAMUSCULAR | Status: DC | PRN
  Administered 2018-03-10: 150 ug via INTRAVENOUS
  Administered 2018-03-10: 50 ug via INTRAVENOUS

## 2018-03-10 MED ORDER — MIDAZOLAM HCL 2 MG/2ML IJ SOLN
INTRAMUSCULAR | Status: DC | PRN
  Administered 2018-03-10: 2 mg via INTRAVENOUS

## 2018-03-10 MED ORDER — ONDANSETRON HCL 4 MG/2ML IJ SOLN
INTRAMUSCULAR | Status: DC | PRN
  Administered 2018-03-10: 4 mg via INTRAVENOUS

## 2018-03-10 MED ORDER — SODIUM CHLORIDE 0.9 % IV SOLN: 250.0000 mL | INTRAVENOUS | Status: DC

## 2018-03-10 MED ORDER — ONDANSETRON HCL 4 MG/2ML IJ SOLN: 4.0000 mg | Freq: Once | INTRAMUSCULAR | Status: DC | PRN

## 2018-03-10 MED ORDER — HYDROMORPHONE HCL 2 MG/ML IJ SOLN
INTRAMUSCULAR | Status: AC
  Administered 2018-03-10: 0.5 mg via INTRAVENOUS
  Filled 2018-03-10: qty 1

## 2018-03-10 MED ORDER — LIDOCAINE 2% (20 MG/ML) 5 ML SYRINGE
INTRAMUSCULAR | Status: DC | PRN
  Administered 2018-03-10: 100 mg via INTRAVENOUS

## 2018-03-10 MED ORDER — KETOROLAC TROMETHAMINE 15 MG/ML IJ SOLN
7.5000 mg | Freq: Four times a day (QID) | INTRAMUSCULAR | Status: AC
  Administered 2018-03-10 – 2018-03-11 (×4): 7.5 mg via INTRAVENOUS
  Filled 2018-03-10 (×3): qty 1

## 2018-03-10 MED ORDER — CEFAZOLIN SODIUM-DEXTROSE 2-4 GM/100ML-% IV SOLN: 2.0000 g | INTRAVENOUS | Status: DC

## 2018-03-10 MED ORDER — MIDAZOLAM HCL 2 MG/2ML IJ SOLN
INTRAMUSCULAR | Status: AC
  Filled 2018-03-10: qty 2

## 2018-03-10 MED ORDER — NITROGLYCERIN 0.4 MG SL SUBL: 0.4000 mg | SUBLINGUAL_TABLET | SUBLINGUAL | Status: DC | PRN

## 2018-03-10 MED ORDER — DEXAMETHASONE SODIUM PHOSPHATE 10 MG/ML IJ SOLN
10.0000 mg | INTRAMUSCULAR | Status: AC
  Administered 2018-03-10: 10 mg via INTRAVENOUS
  Filled 2018-03-10: qty 1

## 2018-03-10 MED ORDER — CHLORHEXIDINE GLUCONATE CLOTH 2 % EX PADS: 6.0000 | MEDICATED_PAD | Freq: Once | CUTANEOUS | Status: DC

## 2018-03-10 MED ORDER — ISOSORBIDE MONONITRATE ER 30 MG PO TB24
30.0000 mg | ORAL_TABLET | Freq: Every day | ORAL | Status: DC
  Administered 2018-03-11: 30 mg via ORAL
  Filled 2018-03-10: qty 1

## 2018-03-10 MED ORDER — PHENYLEPHRINE HCL 10 MG/ML IJ SOLN
INTRAMUSCULAR | Status: DC | PRN
  Administered 2018-03-10: 120 ug via INTRAVENOUS

## 2018-03-10 MED ORDER — DEXAMETHASONE SODIUM PHOSPHATE 10 MG/ML IJ SOLN: 10.0000 mg | INTRAMUSCULAR | Status: DC

## 2018-03-10 MED ORDER — ACETAMINOPHEN 650 MG RE SUPP: 650.0000 mg | RECTAL | Status: DC | PRN

## 2018-03-10 MED ORDER — SODIUM CHLORIDE 0.9% FLUSH: 3.0000 mL | INTRAVENOUS | Status: DC | PRN

## 2018-03-10 MED ORDER — PHENYLEPHRINE HCL 10 MG/ML IJ SOLN
INTRAVENOUS | Status: DC | PRN
  Administered 2018-03-10: 25 ug/min via INTRAVENOUS

## 2018-03-10 MED ORDER — THROMBIN 5000 UNITS EX SOLR
CUTANEOUS | Status: DC | PRN
  Administered 2018-03-10 (×2): 5000 [IU] via TOPICAL

## 2018-03-10 MED ORDER — ATORVASTATIN CALCIUM 80 MG PO TABS
80.0000 mg | ORAL_TABLET | Freq: Every day | ORAL | Status: DC
  Administered 2018-03-10: 80 mg via ORAL
  Filled 2018-03-10: qty 1

## 2018-03-10 MED ORDER — FUROSEMIDE 20 MG PO TABS
20.0000 mg | ORAL_TABLET | Freq: Every morning | ORAL | Status: DC
  Administered 2018-03-11: 20 mg via ORAL
  Filled 2018-03-10: qty 1

## 2018-03-10 MED ORDER — MENTHOL 3 MG MT LOZG: 1.0000 | LOZENGE | OROMUCOSAL | Status: DC | PRN

## 2018-03-10 MED ORDER — FENTANYL CITRATE (PF) 250 MCG/5ML IJ SOLN
INTRAMUSCULAR | Status: AC
  Filled 2018-03-10: qty 5

## 2018-03-10 MED ORDER — ONDANSETRON HCL 4 MG/2ML IJ SOLN
INTRAMUSCULAR | Status: AC
  Filled 2018-03-10: qty 2

## 2018-03-10 MED ORDER — ROCURONIUM BROMIDE 10 MG/ML (PF) SYRINGE
PREFILLED_SYRINGE | INTRAVENOUS | Status: DC | PRN
  Administered 2018-03-10: 60 mg via INTRAVENOUS
  Administered 2018-03-10: 20 mg via INTRAVENOUS

## 2018-03-10 MED ORDER — LACTATED RINGERS IV SOLN
INTRAVENOUS | Status: DC
  Administered 2018-03-10 (×2): via INTRAVENOUS

## 2018-03-10 MED ORDER — BUPIVACAINE HCL (PF) 0.25 % IJ SOLN
INTRAMUSCULAR | Status: DC | PRN
  Administered 2018-03-10: 20 mL

## 2018-03-10 MED ORDER — PHENYLEPHRINE 40 MCG/ML (10ML) SYRINGE FOR IV PUSH (FOR BLOOD PRESSURE SUPPORT)
PREFILLED_SYRINGE | INTRAVENOUS | Status: AC
  Filled 2018-03-10: qty 10

## 2018-03-10 MED ORDER — CEFAZOLIN SODIUM-DEXTROSE 1-4 GM/50ML-% IV SOLN
1.0000 g | Freq: Three times a day (TID) | INTRAVENOUS | Status: AC
  Administered 2018-03-10 – 2018-03-11 (×2): 1 g via INTRAVENOUS
  Filled 2018-03-10 (×2): qty 50

## 2018-03-10 MED ORDER — THROMBIN 5000 UNITS EX SOLR
CUTANEOUS | Status: AC
  Filled 2018-03-10: qty 10000

## 2018-03-10 MED ORDER — BUPIVACAINE HCL (PF) 0.25 % IJ SOLN
INTRAMUSCULAR | Status: AC
  Filled 2018-03-10: qty 30

## 2018-03-10 MED ORDER — PANTOPRAZOLE SODIUM 40 MG PO TBEC
40.0000 mg | DELAYED_RELEASE_TABLET | Freq: Every day | ORAL | Status: DC
  Administered 2018-03-10 – 2018-03-11 (×2): 40 mg via ORAL
  Filled 2018-03-10 (×2): qty 1

## 2018-03-10 MED ORDER — HYDROCODONE-ACETAMINOPHEN 5-325 MG PO TABS
ORAL_TABLET | ORAL | Status: AC
  Filled 2018-03-10: qty 1

## 2018-03-10 MED ORDER — ONDANSETRON HCL 4 MG/2ML IJ SOLN: 4.0000 mg | Freq: Four times a day (QID) | INTRAMUSCULAR | Status: DC | PRN

## 2018-03-10 MED ORDER — HYDROMORPHONE HCL 1 MG/ML IJ SOLN: 1.0000 mg | INTRAMUSCULAR | Status: DC | PRN

## 2018-03-10 MED ORDER — KETOROLAC TROMETHAMINE 30 MG/ML IJ SOLN
INTRAMUSCULAR | Status: AC
  Filled 2018-03-10: qty 1

## 2018-03-10 MED ORDER — 0.9 % SODIUM CHLORIDE (POUR BTL) OPTIME
TOPICAL | Status: DC | PRN
  Administered 2018-03-10: 1000 mL

## 2018-03-10 MED ORDER — CYCLOBENZAPRINE HCL 10 MG PO TABS
10.0000 mg | ORAL_TABLET | Freq: Three times a day (TID) | ORAL | Status: DC | PRN
  Administered 2018-03-10 – 2018-03-11 (×2): 10 mg via ORAL
  Filled 2018-03-10: qty 1

## 2018-03-10 MED ORDER — ADULT MULTIVITAMIN W/MINERALS CH
1.0000 | ORAL_TABLET | Freq: Every day | ORAL | Status: DC
  Administered 2018-03-11: 1 via ORAL
  Filled 2018-03-10 (×2): qty 1

## 2018-03-10 MED ORDER — PROPOFOL 10 MG/ML IV BOLUS
INTRAVENOUS | Status: DC | PRN
  Administered 2018-03-10: 200 mg via INTRAVENOUS

## 2018-03-10 MED ORDER — HYDROCHLOROTHIAZIDE 25 MG PO TABS
25.0000 mg | ORAL_TABLET | Freq: Every day | ORAL | Status: DC
  Administered 2018-03-11: 25 mg via ORAL
  Filled 2018-03-10: qty 1

## 2018-03-10 MED ORDER — CYCLOBENZAPRINE HCL 10 MG PO TABS
ORAL_TABLET | ORAL | Status: AC
  Filled 2018-03-10: qty 1

## 2018-03-10 MED ORDER — THROMBIN (RECOMBINANT) 5000 UNITS EX SOLR
OROMUCOSAL | Status: DC | PRN
  Administered 2018-03-10: 11:00:00 via TOPICAL

## 2018-03-10 MED ORDER — KETOROLAC TROMETHAMINE 15 MG/ML IJ SOLN
INTRAMUSCULAR | Status: AC
  Administered 2018-03-10: 7.5 mg via INTRAVENOUS
  Filled 2018-03-10: qty 1

## 2018-03-10 MED ORDER — SUGAMMADEX SODIUM 500 MG/5ML IV SOLN
INTRAVENOUS | Status: AC
  Filled 2018-03-10: qty 10

## 2018-03-10 MED ORDER — LIDOCAINE 2% (20 MG/ML) 5 ML SYRINGE
INTRAMUSCULAR | Status: AC
  Filled 2018-03-10: qty 5

## 2018-03-10 MED ORDER — SODIUM CHLORIDE 0.9% FLUSH
3.0000 mL | Freq: Two times a day (BID) | INTRAVENOUS | Status: DC
  Administered 2018-03-11: 3 mL via INTRAVENOUS

## 2018-03-10 MED ORDER — CARVEDILOL 12.5 MG PO TABS
12.5000 mg | ORAL_TABLET | Freq: Two times a day (BID) | ORAL | Status: DC
  Administered 2018-03-10 – 2018-03-11 (×2): 12.5 mg via ORAL
  Filled 2018-03-10 (×2): qty 1

## 2018-03-10 MED ORDER — HYDROMORPHONE HCL 2 MG/ML IJ SOLN
0.2500 mg | INTRAMUSCULAR | Status: DC | PRN
  Administered 2018-03-10: 0.5 mg via INTRAVENOUS

## 2018-03-10 MED ORDER — HYDROCODONE-ACETAMINOPHEN 10-325 MG PO TABS
2.0000 | ORAL_TABLET | ORAL | Status: DC | PRN
  Administered 2018-03-10 – 2018-03-11 (×2): 2 via ORAL
  Filled 2018-03-10 (×2): qty 2

## 2018-03-10 MED ORDER — SUGAMMADEX SODIUM 200 MG/2ML IV SOLN
INTRAVENOUS | Status: DC | PRN
  Administered 2018-03-10: 500 mg via INTRAVENOUS

## 2018-03-10 MED ORDER — ACETAMINOPHEN 325 MG PO TABS: 650.0000 mg | ORAL_TABLET | ORAL | Status: DC | PRN

## 2018-03-10 MED ORDER — SODIUM CHLORIDE 0.9 % IV SOLN
INTRAVENOUS | Status: DC | PRN
  Administered 2018-03-10: 09:00:00

## 2018-03-10 MED ORDER — THROMBIN 5000 UNITS EX SOLR
CUTANEOUS | Status: AC
  Filled 2018-03-10: qty 5000

## 2018-03-10 MED ORDER — ONDANSETRON HCL 4 MG PO TABS: 4.0000 mg | ORAL_TABLET | Freq: Four times a day (QID) | ORAL | Status: DC | PRN

## 2018-03-10 MED ORDER — HEMOSTATIC AGENTS (NO CHARGE) OPTIME
TOPICAL | Status: DC | PRN
  Administered 2018-03-10: 1 via TOPICAL

## 2018-03-10 MED ORDER — GABAPENTIN 100 MG PO CAPS: 100.0000 mg | ORAL_CAPSULE | Freq: Two times a day (BID) | ORAL | Status: DC | PRN

## 2018-03-10 MED ORDER — CEFAZOLIN SODIUM-DEXTROSE 2-4 GM/100ML-% IV SOLN
2.0000 g | INTRAVENOUS | Status: AC
  Administered 2018-03-10: 2 g via INTRAVENOUS
  Filled 2018-03-10: qty 100

## 2018-03-10 SURGICAL SUPPLY — 50 items
BAG DECANTER FOR FLEXI CONT (MISCELLANEOUS) ×3 IMPLANT
BENZOIN TINCTURE PRP APPL 2/3 (GAUZE/BANDAGES/DRESSINGS) ×3 IMPLANT
BLADE CLIPPER SURG (BLADE) IMPLANT
BUR CUTTER 7.0 ROUND (BURR) ×3 IMPLANT
CANISTER SUCT 3000ML PPV (MISCELLANEOUS) ×3 IMPLANT
CARTRIDGE OIL MAESTRO DRILL (MISCELLANEOUS) ×1 IMPLANT
CLOSURE WOUND 1/2 X4 (GAUZE/BANDAGES/DRESSINGS) ×1
DECANTER SPIKE VIAL GLASS SM (MISCELLANEOUS) ×3 IMPLANT
DERMABOND ADVANCED (GAUZE/BANDAGES/DRESSINGS) ×2
DERMABOND ADVANCED .7 DNX12 (GAUZE/BANDAGES/DRESSINGS) ×1 IMPLANT
DIFFUSER DRILL AIR PNEUMATIC (MISCELLANEOUS) ×3 IMPLANT
DRAPE HALF SHEET 40X57 (DRAPES) IMPLANT
DRAPE LAPAROTOMY 100X72X124 (DRAPES) ×3 IMPLANT
DRAPE MICROSCOPE LEICA (MISCELLANEOUS) ×3 IMPLANT
DRAPE SURG 17X23 STRL (DRAPES) ×6 IMPLANT
DRSG OPSITE POSTOP 3X4 (GAUZE/BANDAGES/DRESSINGS) ×3 IMPLANT
DURAPREP 26ML APPLICATOR (WOUND CARE) ×3 IMPLANT
ELECT REM PT RETURN 9FT ADLT (ELECTROSURGICAL) ×3
ELECTRODE REM PT RTRN 9FT ADLT (ELECTROSURGICAL) ×1 IMPLANT
GAUZE SPONGE 4X4 12PLY STRL (GAUZE/BANDAGES/DRESSINGS) ×3 IMPLANT
GAUZE SPONGE 4X4 16PLY XRAY LF (GAUZE/BANDAGES/DRESSINGS) IMPLANT
GLOVE BIOGEL PI IND STRL 6.5 (GLOVE) ×1 IMPLANT
GLOVE BIOGEL PI IND STRL 9 (GLOVE) ×1 IMPLANT
GLOVE BIOGEL PI INDICATOR 6.5 (GLOVE) ×2
GLOVE BIOGEL PI INDICATOR 9 (GLOVE) ×2
GLOVE ECLIPSE 9.0 STRL (GLOVE) IMPLANT
GLOVE EXAM NITRILE LRG STRL (GLOVE) IMPLANT
GLOVE EXAM NITRILE XL STR (GLOVE) IMPLANT
GLOVE EXAM NITRILE XS STR PU (GLOVE) IMPLANT
GOWN STRL REUS W/ TWL LRG LVL3 (GOWN DISPOSABLE) ×1 IMPLANT
GOWN STRL REUS W/ TWL XL LVL3 (GOWN DISPOSABLE) ×1 IMPLANT
GOWN STRL REUS W/TWL 2XL LVL3 (GOWN DISPOSABLE) IMPLANT
GOWN STRL REUS W/TWL LRG LVL3 (GOWN DISPOSABLE) ×2
GOWN STRL REUS W/TWL XL LVL3 (GOWN DISPOSABLE) ×2
KIT BASIN OR (CUSTOM PROCEDURE TRAY) ×3 IMPLANT
KIT TURNOVER KIT B (KITS) ×3 IMPLANT
NEEDLE HYPO 22GX1.5 SAFETY (NEEDLE) ×3 IMPLANT
NEEDLE SPNL 22GX3.5 QUINCKE BK (NEEDLE) ×3 IMPLANT
NS IRRIG 1000ML POUR BTL (IV SOLUTION) ×3 IMPLANT
OIL CARTRIDGE MAESTRO DRILL (MISCELLANEOUS) ×3
PACK LAMINECTOMY NEURO (CUSTOM PROCEDURE TRAY) ×3 IMPLANT
PAD ARMBOARD 7.5X6 YLW CONV (MISCELLANEOUS) ×9 IMPLANT
RUBBERBAND STERILE (MISCELLANEOUS) ×6 IMPLANT
SPONGE SURGIFOAM ABS GEL SZ50 (HEMOSTASIS) ×3 IMPLANT
STRIP CLOSURE SKIN 1/2X4 (GAUZE/BANDAGES/DRESSINGS) ×2 IMPLANT
SUT VIC AB 2-0 CT1 18 (SUTURE) ×3 IMPLANT
SUT VIC AB 3-0 SH 8-18 (SUTURE) ×3 IMPLANT
TOWEL GREEN STERILE (TOWEL DISPOSABLE) ×3 IMPLANT
TOWEL GREEN STERILE FF (TOWEL DISPOSABLE) ×3 IMPLANT
WATER STERILE IRR 1000ML POUR (IV SOLUTION) ×3 IMPLANT

## 2018-03-10 NOTE — H&P (Signed)
, Carrie Larsen is an 72 y.o. female.   Chief Complaint: Back and leg pain HPI: 72 year old female with progressive lower back pain with radiation to both lower extremities left greater than right.  Workup demonstrates evidence of marked lumbar stenosis at L3-4 and L4-5.  Patient is failed conservative management.  She has ongoing pain and symptoms of neurogenic claudication.  She presents now for lumbar decompressive surgery.  Past Medical History:  Diagnosis Date  . CAD (coronary artery disease)    a. 2012 ETT: no ischemia;  b. 06/2017 NSTEMI/PCI: LM nl, LAD nl, D1 70-80p, LCX nl, OM1 90 (3.0 x 23 Xience [place]), RCA dominant, 100p/m, fills via L->R collats, EF 55-65%.  . Cancer [facility])    a. s/p partial left nephrectomy.  . Colon cancer ([place]) 1992   T3, N1, M0.  . Colon polyp 2011  . Diastolic dysfunction    a. 08/2015 Echo: EF 60-65%, no rwma, Gr1 DD, midlly dil LA, PASP 44mHg. No significant valvular dzs; b. 06/2017 Echo: EF 60-65%, Gr1DD, mildly dil LA.  . Essential hypertension   . GERD (gastroesophageal reflux disease)   . History of kidney stones    passed - 2  . Hyperlipidemia   . Lumbar spinal stenosis   [place]  . Retinal cyst     Past Surgical History:  Procedure Laterality Date  . ABDOMINAL HYSTERECTOMY    . APPENDECTOMY    . CATARACT EXTRACTION W/PHACO Left 04/24/2016   Procedure: CATARACT EXTRACTION PHACO AND INTRAOCULAR LENS PLACEMENT (IOC);  Surgeon: WBirder Robson MD;  Location: [facility] ORS;  Service: Ophthalmology;  Laterality: Left;  [place]45.[place] AP% 16.4 CDE 7.53 FLUID PACK LOT # [id number]  . CATARACT EXTRACTION W/PHACO Right 06/05/2016   Procedure: CATARACT EXTRACTION PHACO AND INTRAOCULAR LENS PLACEMENT (IOC);  Surgeon: WBirder Robson MD;  Location: [facility] ORS;  Service: Ophthalmology;  Laterality: Right;  [place]25.9 AP% 21.9 CDE 5.68 Fluid pack lot # [id number]  . COLON RESECTION  03/04/1991  . COLON SURGERY    . COLONOSCOPY   03-14-10   Dr BBary Castilla tubular adenoma at 25 cm.  . COLONOSCOPY WITH PROPOFOL N/A 06/07/2015   Procedure: COLONOSCOPY WITH PROPOFOL;  Surgeon: JRobert Bellow MD;  [facility];  Service: Endoscopy;  Laterality: N/A;  . CORONARY STENT INTERVENTION N/A 06/28/2017   Procedure: CORONARY STENT INTERVENTION;  Surgeon: CYolonda Kida MD;  [facility];  Service: Cardiovascular;  Laterality: N/A;  . HERNIA REPAIR  [id number]  umbilical  . LEFT HEART CATH AND CORONARY ANGIOGRAPHY N/A 06/28/2017   Procedure: LEFT HEART CATH AND CORONARY ANGIOGRAPHY;  Surgeon: GMinna Merritts MD;  [facility];  Service: Cardiovascular;  Laterality: N/A;  . NEPHRECTOMY  1992   left- cancer  . RIB RESECTION     removal 4 ribs  . TONSILLECTOMY      Family History  Problem Relation Age of Onset  . Colon cancer Mother   . Cerebral aneurysm Father    Social History:  reports that she has never smoked. She has never used smokeless tobacco. She reports that she does not drink alcohol or use drugs.  Allergies:  Allergies  Allergen Reactions  . Morphine And Related Nausea Only  . Latex Rash    Medications Prior to Admission  Medication Sig Dispense Refill  . aspirin EC 81 MG tablet Take 1 tablet (81 mg total) by mouth daily. 90 tablet 3  .  atorvastatin (LIPITOR) 80 MG tablet Take 1 tablet (80 mg total) by mouth daily at 6 PM. 90 tablet 0  . carvedilol (COREG) 12.5 MG tablet Take 1 tablet (12.5 mg total) by mouth 2 (two) times daily with a meal. 180 tablet 0  . clopidogrel (PLAVIX) 75 MG tablet Take 1 tablet (75 mg total) by mouth daily. 90 tablet 3  . esomeprazole (NEXIUM) 40 MG capsule Take 40 mg by mouth daily at 3 pm.     . furosemide (LASIX) 20 MG tablet Take 20 mg by mouth every morning.  0  . gabapentin (NEURONTIN) 100 MG capsule Take 100 mg by mouth 2 (two) times daily as needed (nerve pain).    . hydrochlorothiazide (HYDRODIURIL) 25 MG tablet Take 25 mg by  mouth daily at 3 pm.     . isosorbide mononitrate (IMDUR) 30 MG 24 hr tablet Take 1 tablet (30 mg total) by mouth daily. 90 tablet 0  . Multiple Vitamins-Minerals (MULTIVITAMIN WITH MINERALS) tablet Take 1 tablet by mouth daily.    . nitroGLYCERIN (NITROSTAT) 0.4 MG SL tablet Place 1 tablet (0.4 mg total) under the tongue every 5 (five) minutes x 3 doses as needed for chest pain. 20 tablet 1    Results for orders placed or performed during the hospital encounter of 03/10/18 (from the past 48 hour(s))  Basic metabolic panel     Status: Abnormal   Collection Time: 03/10/18  7:24 AM  Result Value Ref Range   Sodium 141 135 - 145 mmol/L   Potassium 3.5 3.5 - 5.1 mmol/L   Chloride 102 101 - 111 mmol/L   CO2 27 22 - 32 mmol/L   Glucose, Bld 108 (H) 65 - 99 mg/dL   BUN 21 (H) 6 - 20 mg/dL   Creatinine, Ser 0.94 0.44 - 1.00 mg/dL   Calcium 9.7 8.9 - 10.3 mg/dL   GFR calc non Af Amer 59 (L) >60 mL/min   GFR calc Af Amer >60 >60 mL/min    Comment: (NOTE) The eGFR has been calculated using the CKD EPI equation. This calculation has not been validated in all clinical situations. eGFR's persistently <60 mL/min signify possible Chronic Kidney Disease.    Anion gap 12 5 - 15    Comment: Performed at [facility] [address] [place], [place] [address]  CBC WITH DIFFERENTIAL     Status: None   Collection Time: 03/10/18  7:24 AM  Result Value Ref Range   WBC 9.8 4.0 - 10.5 K/uL   RBC 5.01 3.87 - 5.11 MIL/uL   Hemoglobin 14.3 12.0 - 15.0 g/dL   HCT 44.5 36.0 - 46.0 %   MCV 88.8 78.0 - 100.0 fL   MCH 28.5 26.0 - 34.0 pg   MCHC 32.1 30.0 - 36.0 g/dL   RDW 14.2 11.5 - 15.5 %   Platelets 331 150 - 400 K/uL   Neutrophils Relative % 67 %   Neutro Abs 6.5 1.7 - 7.7 K/uL   Lymphocytes Relative 27 %   Lymphs Abs 2.6 0.7 - 4.0 K/uL   Monocytes Relative 5 %   Monocytes Absolute 0.5 0.1 - 1.0 K/uL   Eosinophils Relative 1 %   Eosinophils Absolute 0.1 0.0 - 0.7 K/uL   Basophils Relative 0  %   Basophils Absolute 0.0 0.0 - 0.1 K/uL    Comment: Performed at [facility], [place] [address], [place], [place] [address]   No results found.  Pertinent items noted  in HPI and remainder of comprehensive ROS otherwise negative.  Blood pressure (!) 159/88, pulse 72, temperature 98.1 F (36.7 C), temperature source Oral, resp. rate 18, weight 113.4 kg (250 lb), SpO2 98 %.  Patient is awake and alert.  She is oriented and appropriate.  Speech is fluent.  Judgment and insight are intact.  Cranial nerve function normal bilaterally.  Motor examination 5/5 bilaterally except she has some mild left-sided dorsiflexion weakness.  Sensory examination nonfocal.  Deep tendon reflexes normoactive.  No evidence of long track signs.  Gait antalgic.  Posture flexed.  Examination head ears eyes nose throat is unremarkable her chest and abdomen are benign.  Extremities are free of major deformity. Assessment/Plan L3-4, L4-5 stenosis.  Plan bilateral L3-4, L4-5 decompressive laminotomies and foraminotomies.  Risks and benefits of been explained.  Patient wishes to proceed.  Mallie Mussel A Nicle Connole 03/10/2018, 9:27 AM

## 2018-03-10 NOTE — Addendum Note (Signed)
Addendum  created 03/10/18 1403 by Bryson Corona, CRNA   Intraprocedure Meds edited

## 2018-03-10 NOTE — Anesthesia Procedure Notes (Signed)
Procedure Name: Intubation Date/Time: 03/10/2018 9:45 AM Performed by: Bryson Corona, CRNA Pre-anesthesia Checklist: Patient identified, Emergency Drugs available, Suction available and Patient being monitored Patient Re-evaluated:Patient Re-evaluated prior to induction Oxygen Delivery Method: Circle System Utilized Preoxygenation: Pre-oxygenation with 100% oxygen Induction Type: IV induction Ventilation: Mask ventilation without difficulty Laryngoscope Size: Mac and 3 Grade View: Grade I Tube type: Oral Tube size: 7.0 mm Number of attempts: 1 Airway Equipment and Method: Stylet Placement Confirmation: ETT inserted through vocal cords under direct vision,  positive ETCO2 and breath sounds checked- equal and bilateral Secured at: 21 cm Tube secured with: Tape Dental Injury: Teeth and Oropharynx as per pre-operative assessment

## 2018-03-10 NOTE — Transfer of Care (Signed)
Immediate Anesthesia Transfer of Care Note  Patient: Carrie Larsen  Procedure(s) Performed: Laminectomy and Foraminotomy - Lumbar three-Lumbar four - Lumbar four-Lumbar five - bilateral (Bilateral Back)  Patient Location: PACU  Anesthesia Type:General  Level of Consciousness: awake, alert  and oriented  Airway & Oxygen Therapy: Patient Spontanous Breathing and Patient connected to nasal cannula oxygen  Post-op Assessment: Report given to RN and Post -op Vital signs reviewed and stable  Post vital signs: Reviewed and stable  Last Vitals:  Vitals Value Taken Time  BP 141/82 03/10/2018 11:49 AM  Temp    Pulse 87 03/10/2018 11:50 AM  Resp 25 03/10/2018 11:50 AM  SpO2 93 % 03/10/2018 11:50 AM  Vitals shown include unvalidated device data.  Last Pain:  Vitals:   03/10/18 0717  TempSrc: Oral      Patients Stated Pain Goal: 5 (14/48/18 5631)  Complications: No apparent anesthesia complications

## 2018-03-10 NOTE — Anesthesia Postprocedure Evaluation (Signed)
Anesthesia Post Note  Patient: Carrie Larsen  Procedure(s) Performed: Laminectomy and Foraminotomy - Lumbar three-Lumbar four - Lumbar four-Lumbar five - bilateral (Bilateral Back)     Patient location during evaluation: PACU Anesthesia Type: General Level of consciousness: awake and alert Pain management: pain level controlled Vital Signs Assessment: post-procedure vital signs reviewed and stable Respiratory status: spontaneous breathing, nonlabored ventilation, respiratory function stable and patient connected to nasal cannula oxygen Cardiovascular status: blood pressure returned to baseline and stable Postop Assessment: no apparent nausea or vomiting Anesthetic complications: no    Last Vitals:  Vitals:   03/10/18 1250 03/10/18 1320  BP: 119/63 108/86  Pulse: 76 79  Resp: 20 17  Temp:    SpO2: 92% 92%    Last Pain:  Vitals:   03/10/18 1315  TempSrc:   PainSc: 3                  Lamesha Tibbits EDWARD

## 2018-03-10 NOTE — Brief Op Note (Signed)
03/10/2018  11:41 AM  PATIENT:  Carrie Larsen  72 y.o. female  PRE-OPERATIVE DIAGNOSIS:  Stenosis  POST-OPERATIVE DIAGNOSIS:  Stenosis  PROCEDURE:  Procedure(s): Laminectomy and Foraminotomy - Lumbar three-Lumbar four - Lumbar four-Lumbar five - bilateral (Bilateral)  SURGEON:  Surgeon(s) and Role:    Earnie Larsson, MD - Primary  PHYSICIAN ASSISTANT:   ASSISTANTS:    ANESTHESIA:   general  EBL:  200 mL   BLOOD ADMINISTERED:none  DRAINS: none   LOCAL MEDICATIONS USED:  MARCAINE     SPECIMEN:  No Specimen  DISPOSITION OF SPECIMEN:  N/A  COUNTS:  YES  TOURNIQUET:  * No tourniquets in log *  DICTATION: .Dragon Dictation  PLAN OF CARE: Admit for overnight observation  PATIENT DISPOSITION:  PACU - hemodynamically stable.   Delay start of Pharmacological VTE agent (>24hrs) due to surgical blood loss or risk of bleeding: yes

## 2018-03-10 NOTE — Op Note (Signed)
Date of procedure: 03/10/2018  Date of dictation: Same  Service: Neurosurgery  Preoperative diagnosis: Lumbar stenosis with neurogenic claudication  Postoperative diagnosis: Same   Procedure Name: Bilateral L3-L4, L4-L5 decompressive laminotomies with foraminotomies  Surgeon:Galdino Hinchman A.Edwin Cherian, M.D.  Asst. Surgeon: None  Anesthesia: General  Indication: 72 year old female with back and bilateral lower extremity symptoms left greater than right failing conservative management.  Workup demonstrates evidence of marked lumbar spondylosis with stenosis.  Patient is failed conservative management.  She presents now for decompressive surgery in hopes of improving her symptoms.  Operative note: After induction of anesthesia, patient position prone on the Wilson frame and appropriately padded.  Lumbar region prepped and draped sterilely.  Incision made overlying L3-L4-L5.  Dissection performed bilaterally.  Retractor placed.  X-ray taken.  Levels confirmed.  Laminotomies then performed using high-speed drill and Kerrison rongeurs to remove the inferior aspect lamina of L3 and L4 bilaterally.  The medial aspect of the L3-4 and L45 facet joints bilaterally and the superior rim of the L4 and L5 lamina bilaterally.  We have inflame elevated and resected.  Foraminotomies and completed on the course exiting L3-L4 and L5 nerve roots bilaterally.  At this point a very thorough decompression been achieved.  There was no evidence of injury to thecal sac or nerve roots.  Wound was then irrigated fanlike solution.  Gelfoam was placed topically for hemostasis.  Wounds and closed in layers with Vicryl sutures.  Steri-Strips and sterile dressing were applied.  No apparent complications.  Patient tolerated the procedure well and she returns to the recovery room postop.

## 2018-03-11 ENCOUNTER — Other Ambulatory Visit: Payer: Self-pay

## 2018-03-11 ENCOUNTER — Ambulatory Visit: Payer: Medicare Other | Admitting: Cardiovascular Disease

## 2018-03-11 ENCOUNTER — Encounter (HOSPITAL_COMMUNITY): Payer: Self-pay | Admitting: Neurosurgery

## 2018-03-11 DIAGNOSIS — M48062 Spinal stenosis, lumbar region with neurogenic claudication: Secondary | ICD-10-CM | POA: Diagnosis not present

## 2018-03-11 MED ORDER — CYCLOBENZAPRINE HCL 10 MG PO TABS: 10.0000 mg | ORAL_TABLET | Freq: Three times a day (TID) | ORAL | 0 refills | Status: DC | PRN

## 2018-03-11 MED ORDER — HYDROCODONE-ACETAMINOPHEN 5-325 MG PO TABS: 1.0000 | ORAL_TABLET | ORAL | 0 refills | Status: DC | PRN

## 2018-03-11 MED FILL — Thrombin For Soln 5000 Unit: CUTANEOUS | Qty: 5000 | Status: AC

## 2018-03-11 NOTE — Discharge Summary (Signed)
Physician Discharge Summary  Patient ID: Carrie Larsen MRN: [id number] DOB/AGE: [birth year] 72 y.o.  Admit date: 03/10/2018 Discharge date: 03/11/2018  Admission Diagnoses:  Discharge Diagnoses:  Active Problems:   Lumbar stenosis with neurogenic claudication   Discharged Condition: good  Hospital Course: The patient was admitted to the hospital where she underwent uncomplicated 2 level bilateral lumbar decompression.  Postoperatively she is doing well.  Ambulating without difficulty.  Ready for discharge home.  Consults:   Significant Diagnostic Studies:   Treatments:   Discharge Exam: Blood pressure 122/71, pulse 73, temperature 98.1 F (36.7 C), temperature source Oral, resp. rate 18, weight 113.4 kg (250 lb), SpO2 95 %. Awake and alert.  Oriented and appropriate.  Cranial nerve function intact.  Motor and sensory function extremities normal.  Wound clean and dry.  Chest and abdomen benign.  Disposition: Discharge disposition: 01-Home or Self Care        Allergies as of 03/11/2018      Reactions   Morphine And Related Nausea Only   Latex Rash      Medication List    TAKE these medications   aspirin EC 81 MG tablet Take 1 tablet (81 mg total) by mouth daily.   atorvastatin 80 MG tablet Commonly known as:  LIPITOR Take 1 tablet (80 mg total) by mouth daily at 6 PM.   carvedilol 12.5 MG tablet Commonly known as:  COREG Take 1 tablet (12.5 mg total) by mouth 2 (two) times daily with a meal.   clopidogrel 75 MG tablet Commonly known as:  PLAVIX Take 1 tablet (75 mg total) by mouth daily.   cyclobenzaprine 10 MG tablet Commonly known as:  FLEXERIL Take 1 tablet (10 mg total) by mouth 3 (three) times daily as needed for muscle spasms.   esomeprazole 40 MG capsule Commonly known as:  NEXIUM Take 40 mg by mouth daily at 3 pm.   furosemide 20 MG tablet Commonly known as:  LASIX Take 20 mg by mouth every morning.   gabapentin 100 MG capsule Commonly  known as:  NEURONTIN Take 100 mg by mouth 2 (two) times daily as needed (nerve pain).   hydrochlorothiazide 25 MG tablet Commonly known as:  HYDRODIURIL Take 25 mg by mouth daily at 3 pm.   HYDROcodone-acetaminophen 5-325 MG tablet Commonly known as:  NORCO/VICODIN Take 1-2 tablets by mouth every 4 (four) hours as needed for moderate pain ((score 4 to 6)).   isosorbide mononitrate 30 MG 24 hr tablet Commonly known as:  IMDUR Take 1 tablet (30 mg total) by mouth daily.   multivitamin with minerals tablet Take 1 tablet by mouth daily.   nitroGLYCERIN 0.4 MG SL tablet Commonly known as:  NITROSTAT Place 1 tablet (0.4 mg total) under the tongue every 5 (five) minutes x 3 doses as needed for chest pain.        Signed: Cooper Render Carrie Larsen 03/11/2018, 1:03 PM

## 2018-03-11 NOTE — Care Management Note (Signed)
Case Management Note  Patient Details  Name: Carrie Larsen MRN: [id number] Date of Birth: Jul 26, 1946  Subjective/Objective:    Pt admitted on 03/10/18 s/p 2 level bilateral lumbar decompression.  PTA, pt independent of ADLS.                  Action/Plan: Pt ambulating in halls without difficulty.  Plan dc home today; no dc needs identified.    Expected Discharge Date:  03/11/18               Expected Discharge Plan:  Home/Self Care  In-House Referral:     Discharge planning Services  CM Consult  Post Acute Care Choice:    Choice offered to:     DME Arranged:    DME Agency:     HH Arranged:    HH Agency:     Status of Service:  Completed, signed off  If discussed at [employer] of Stay Meetings, dates discussed:    Additional Comments:  Ella Bodo, RN 03/11/2018, 2:04 PM

## 2018-03-11 NOTE — Evaluation (Signed)
Occupational Therapy Evaluation Patient Details Name: Carrie Larsen MRN: [id number] DOB: 22-Oct-1946 Today's Date: 03/11/2018    History of Present Illness 72 yo female s/p Bilateral L3-L4, L4-L5 decompressive laminotomies with foraminotomies. PMH including CAD, cancer, HTN, and MI (2018).   Clinical Impression   PTA, pt was living alone and was independent. Currently, pt performing ADLs and functional mobility using RW at Mod I level. Provided education on compensatory techniques for LB ADLs, use of reacher, bed mobility, grooming, toileting, and shower transfer; pt demonstrated understanding. Answered all pt questions. Recommend dc home once medically stable per physician. All acute OT needs met and will sign off. Thank you.     Follow Up Recommendations  No OT follow up;Supervision - Intermittent    Equipment Recommendations  None recommended by OT    Recommendations for Other Services       Precautions / Restrictions Precautions Precautions: Back Precaution Booklet Issued: Yes (comment) Precaution Comments: Reviewed all back precautions. Pt demonstrating good understanding Required Braces or Orthoses: Other Brace/Splint Other Brace/Splint: No brace needed per MD order Restrictions Weight Bearing Restrictions: No      Mobility Bed Mobility               General bed mobility comments: OOB upon arrival. Education pt on bed mobility and log roll technique  Transfers Overall transfer level: Modified independent Equipment used: Rolling walker (2 wheeled)             General transfer comment: Pt demonstrating safe transfer technique with increase time and use of RW    Balance Overall balance assessment: No apparent balance deficits (not formally assessed)                                         ADL either performed or assessed with clinical judgement   ADL Overall ADL's : Modified independent                                        General ADL Comments: Providing pt with education on compensatory techniques for ADLs including LB ADLs, use of AE, toileting, grooming, bathing, and shower transfer. Pt donning socks and underwear at Mod I level demonstrating good understanding of back precautions.      Vision Baseline Vision/History: Wears glasses Wears Glasses: At all times Patient Visual Report: No change from baseline       Perception     Praxis      Pertinent Vitals/Pain Pain Assessment: Faces Faces Pain Scale: Hurts a little bit Pain Location: Back Pain Descriptors / Indicators: Constant;Discomfort Pain Intervention(s): Monitored during session;Repositioned     Hand Dominance Right   Extremity/Trunk Assessment Upper Extremity Assessment Upper Extremity Assessment: Overall WFL for tasks assessed   Lower Extremity Assessment Lower Extremity Assessment: Overall WFL for tasks assessed   Cervical / Trunk Assessment Cervical / Trunk Assessment: Other exceptions Cervical / Trunk Exceptions: s/p back surgery   Communication Communication Communication: No difficulties   Cognition Arousal/Alertness: Awake/alert Behavior During Therapy: WFL for tasks assessed/performed Overall Cognitive Status: Within Functional Limits for tasks assessed                                     General Comments  Pt with questions about car transfer, cooking, and exercises. Providing education on safe transfer techqnieus, body mechanics, and limitations.    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, and plays soft ball        OT Problem List: Decreased range of motion;Impaired balance (sitting and/or standing);Decreased knowledge of precautions;Decreased knowledge of use of DME or AE;Pain      OT Treatment/Interventions:      OT Goals(Current goals can be found in the care plan section) Acute Rehab OT Goals Patient Stated Goal: Go home OT Goal Formulation: With patient Time For Goal Achievement: 03/25/18 Potential to Achieve Goals: Good  OT Frequency:     Barriers to D/C:            Co-evaluation              AM-PAC PT "6 Clicks" Daily Activity     Outcome Measure Help from another person eating meals?: None Help from another person taking care of personal grooming?: None Help from another person toileting, which includes using toliet, bedpan, or urinal?: None Help from another person bathing (including washing, rinsing, drying)?: None Help from another person to put on and taking off regular upper body clothing?: None Help from another person to put on and taking off regular lower body clothing?: None 6 Click Score: 24   End of Session Equipment Utilized During Treatment: Rolling walker Nurse Communication: Mobility status  Activity Tolerance: Patient tolerated treatment well Patient left: in chair;with call bell/phone within reach  OT Visit Diagnosis: Pain;Unsteadiness on feet (R26.81) Pain - part of body: (Back)                Time: 7622-6333 OT Time Calculation (min): 20 min Charges:  OT General Charges $OT Visit: 1 Visit OT Evaluation $OT Eval Low Complexity: 1 Low G-Codes:     Andon Villard MSOT, OTR/L Acute Rehab Pager: [phone] Office: [place] 03/11/2018, 1:18 PM

## 2018-03-11 NOTE — Discharge Instructions (Signed)

## 2018-03-11 NOTE — Progress Notes (Signed)
Patient is discharged from room 4NP06 at this time. Alert and in stable condition. IV site d/c'd and instructions read to patient with understanding verbalized. Left unit via wheelchair with all belongings at side.

## 2018-04-10 ENCOUNTER — Other Ambulatory Visit: Payer: Self-pay | Admitting: Cardiovascular Disease

## 2018-04-16 ENCOUNTER — Encounter (INDEPENDENT_AMBULATORY_CARE_PROVIDER_SITE_OTHER): Payer: Medicare Other | Admitting: Ophthalmology

## 2018-04-16 DIAGNOSIS — D3131 Benign neoplasm of right choroid: Secondary | ICD-10-CM | POA: Diagnosis not present

## 2018-04-16 DIAGNOSIS — H26493 Other secondary cataract, bilateral: Secondary | ICD-10-CM | POA: Diagnosis not present

## 2018-04-16 DIAGNOSIS — H35033 Hypertensive retinopathy, bilateral: Secondary | ICD-10-CM

## 2018-04-16 DIAGNOSIS — I10 Essential (primary) hypertension: Secondary | ICD-10-CM | POA: Diagnosis not present

## 2018-04-16 DIAGNOSIS — H43813 Vitreous degeneration, bilateral: Secondary | ICD-10-CM

## 2018-04-16 DIAGNOSIS — H59032 Cystoid macular edema following cataract surgery, left eye: Secondary | ICD-10-CM | POA: Diagnosis not present

## 2018-05-28 ENCOUNTER — Encounter (INDEPENDENT_AMBULATORY_CARE_PROVIDER_SITE_OTHER): Payer: Medicare Other | Admitting: Ophthalmology

## 2018-05-28 DIAGNOSIS — H59032 Cystoid macular edema following cataract surgery, left eye: Secondary | ICD-10-CM

## 2018-05-28 DIAGNOSIS — H353111 Nonexudative age-related macular degeneration, right eye, early dry stage: Secondary | ICD-10-CM

## 2018-05-28 DIAGNOSIS — I10 Essential (primary) hypertension: Secondary | ICD-10-CM | POA: Diagnosis not present

## 2018-05-28 DIAGNOSIS — H35033 Hypertensive retinopathy, bilateral: Secondary | ICD-10-CM | POA: Diagnosis not present

## 2018-05-28 DIAGNOSIS — D3131 Benign neoplasm of right choroid: Secondary | ICD-10-CM

## 2018-05-28 DIAGNOSIS — H43813 Vitreous degeneration, bilateral: Secondary | ICD-10-CM

## 2018-06-02 ENCOUNTER — Telehealth: Payer: Self-pay | Admitting: Cardiovascular Disease

## 2018-06-02 NOTE — Telephone Encounter (Signed)
Pt would like lab orders for cholesterol and everything like she "had before." pt wants to have labs done prior to appt 06/19/18 with [place]. Pt wants appt for labs on a Thursday. Pt ph [phone].

## 2018-06-03 NOTE — Telephone Encounter (Signed)
Spoke with patient and reviewed that I do not see any orders for repeat labs at this time. Reviewed that she had some labs done in April 2019 and then cholesterol numbers are from October 2018. Reviewed those results with her and advised that once she comes in for appointment we can set up any additional labs if needed at that time. She verbalized understanding with no further questions at this time.

## 2018-06-03 NOTE — Telephone Encounter (Signed)
Left voicemail message to call back  

## 2018-06-16 ENCOUNTER — Encounter: Payer: Self-pay | Admitting: Podiatry

## 2018-06-16 ENCOUNTER — Ambulatory Visit (INDEPENDENT_AMBULATORY_CARE_PROVIDER_SITE_OTHER): Payer: Medicare Other | Admitting: Podiatry

## 2018-06-16 DIAGNOSIS — Q828 Other specified congenital malformations of skin: Secondary | ICD-10-CM

## 2018-06-16 DIAGNOSIS — M779 Enthesopathy, unspecified: Secondary | ICD-10-CM

## 2018-06-16 DIAGNOSIS — M778 Other enthesopathies, not elsewhere classified: Secondary | ICD-10-CM

## 2018-06-16 NOTE — Progress Notes (Signed)
She presents today chief complaint of painful callus sub-fifth metatarsal phalangeal joint of the left foot.  She is also interested in getting orthotics and possible injection to the second metatarsophalangeal joint left foot.  Objective: Vital signs are stable she is alert and oriented x3.  Pulses are palpable.  Recurrent reactive hyperkeratotic lesion plantar aspect fifth metatarsal left foot.  She has pain on palpation and range of motion of the second metatarsophalangeal joint left no range of motion of the first metatarsophalangeal joint left.  Reactive hyper keratoma is present painful.  Assessment: Pes planus with hallux limitus and capsulitis of the second metatarsal phalangeal joint left painful fifth metatarsal phalangeal joint porokeratotic lesion.  Plan: Discussed etiology pathology conservative surgical therapies at this point in time after sterile Betadine skin prep injected around the second metatarsophalangeal joint left.  Debrided reactive hyperkeratosis.  She will meet with Liliane Channel to have a pair of orthotics made with apertures.

## 2018-06-18 NOTE — Progress Notes (Signed)
Cardiology Office Note  Date:  06/19/2018   ID:  Carrie Larsen, [place] 1946-04-08, MRN [id number]  PCP:  Lavonne Chick, MD   Chief Complaint  Patient presents with  . other    Pt. c/o bruising all over. Meds reviewed by the pt. verbally.     HPI:  Ms. Campoy is a very pleasant 72 year old woman with a history of  Coronary artery disease PCI to an OM vessel 06/2017 Occluded RCA with collaterals from left to right hyperlipidemia,  colon cancer,  status post nephrectomy on the left, who presents for routine followup of her coronary artery disease, hyperlipidemia  No recent lipid panel available Chronic back pain, had surgery, much better Active  Completed aspirin Plavix for one year Now having significant bruising  Potassium 3.5 in 02/2018 While in the hospital for back surgery Hospital records reviewed with the patient in detail  Denies any chest pain, SOB on exertion  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 Stop the medication Tolerating Lipitor 80 daily  Significant bruising from the Plavix  Declined EKG  Other past medical history reviewed Hospital admission August 2018 for non-STEMI Troponin up to 0.88 CT scan with no PE Cardiac catheterization with PCI of OM vessel There is occluded proximal to mid RCA with collaterals from left to right, appeared chronic  Echocardiogram with ejection fraction 55-65% Previous total cholesterol 324 Statin intolerance in the past Repeat lab work showing total cholesterol 193 on Lipitor 80 mg daily  She reports that she has tried several cholesterol medications and all of these have called myalgias. She recalls trying Lipitor, simvastatin, Crestor, possibly others   She reports having an episode of left-sided numbness on her face 11/14/2013. Workup was negative. Including cardiac enzymes, basic metabolic panels, CBC, chest x-ray and  urinalysis   PMH:   has a past medical history of CAD (coronary artery disease), Cancer ([place]), Colon cancer ([place]) (1992), Colon polyp (5885), Diastolic dysfunction, Essential hypertension, GERD (gastroesophageal reflux disease), History of kidney stones, Hyperlipidemia, Lumbar spinal stenosis, Myocardial infarction ([place]), and Retinal cyst.  PSH:    Past Surgical History:  Procedure Laterality Date  . ABDOMINAL HYSTERECTOMY    . APPENDECTOMY    . CATARACT EXTRACTION W/PHACO Left 04/24/2016   Procedure: CATARACT EXTRACTION PHACO AND INTRAOCULAR LENS PLACEMENT (IOC);  Surgeon: Birder Robson, MD;  Location: [facility] ORS;  Service: Ophthalmology;  Laterality: Left;  [place] 45.8 AP% 16.4 CDE 7.53 FLUID PACK LOT # [id number]  . CATARACT EXTRACTION W/PHACO Right 06/05/2016   Procedure: CATARACT EXTRACTION PHACO AND INTRAOCULAR LENS PLACEMENT (IOC);  Surgeon: Birder Robson, MD;  Location: [facility] ORS;  Service: Ophthalmology;  Laterality: Right;  [place] 25.9 AP% 21.9 CDE 5.68 Fluid pack lot # [id number]  . COLON RESECTION  03/04/1991  . COLON SURGERY    . COLONOSCOPY  03-14-10   Dr Bary Castilla, tubular adenoma at 25 cm.  . COLONOSCOPY WITH PROPOFOL N/A 06/07/2015   Procedure: COLONOSCOPY WITH PROPOFOL;  Surgeon: Robert Bellow, MD;  Location: [facility] ENDOSCOPY;  Service: Endoscopy;  Laterality: N/A;  . CORONARY STENT INTERVENTION N/A 06/28/2017   Procedure: CORONARY STENT INTERVENTION;  Surgeon: Yolonda Kida, MD;  Location: [place] CV LAB;  Service: Cardiovascular;  Laterality: N/A;  . HERNIA REPAIR  [id number]   umbilical  . LEFT HEART CATH AND CORONARY ANGIOGRAPHY N/A 06/28/2017   Procedure: LEFT HEART CATH AND CORONARY ANGIOGRAPHY;  Surgeon: Minna Merritts, MD;  Location: [place] CV LAB;  Service: Cardiovascular;  Laterality: N/A;  . LUMBAR LAMINECTOMY/DECOMPRESSION MICRODISCECTOMY Bilateral 03/10/2018   Procedure: Laminectomy and Foraminotomy - Lumbar three-Lumbar four - Lumbar four-Lumbar five -  bilateral;  Surgeon: Earnie Larsson, MD;  Location: [place];  Service: Neurosurgery;  Laterality: Bilateral;  . NEPHRECTOMY  1992   left- cancer  . RIB RESECTION     removal 4 ribs  . TONSILLECTOMY      Current Outpatient Medications  Medication Sig Dispense Refill  . aspirin EC 81 MG tablet Take 1 tablet (81 mg total) by mouth daily. 90 tablet 3  . atorvastatin (LIPITOR) 80 MG tablet TAKE 1 TABLET BY MOUTH ONCE DAILY AT 6PM 90 tablet 2  . carvedilol (COREG) 12.5 MG tablet TAKE 1 TABLET BY MOUTH TWICE DAILY WITH A MEAL 180 tablet 2  . clopidogrel (PLAVIX) 75 MG tablet Take 1 tablet (75 mg total) by mouth daily. 90 tablet 3  . cyclobenzaprine (FLEXERIL) 10 MG tablet Take 1 tablet (10 mg total) by mouth 3 (three) times daily as needed for muscle spasms. 30 tablet 0  . esomeprazole (NEXIUM) 40 MG capsule Take 40 mg by mouth daily at 3 pm.     . furosemide (LASIX) 20 MG tablet Take 20 mg by mouth every morning.  0  . hydrochlorothiazide (HYDRODIURIL) 25 MG tablet Take 25 mg by mouth daily at 3 pm.     . isosorbide mononitrate (IMDUR) 30 MG 24 hr tablet Take 1 tablet (30 mg total) by mouth daily. 90 tablet 0  . Multiple Vitamins-Minerals (MULTIVITAMIN WITH MINERALS) tablet Take 1 tablet by mouth daily.    . nitroGLYCERIN (NITROSTAT) 0.4 MG SL tablet Place 1 tablet (0.4 mg total) under the tongue every 5 (five) minutes x 3 doses as needed for chest pain. 20 tablet 1   No current facility-administered medications for this visit.      Allergies:   Morphine and related and Latex   Social History:  The patient  reports that she has never smoked. She has never used smokeless tobacco. She reports that she does not drink alcohol or use drugs.   Family History:   family history includes Cerebral aneurysm in her father; Colon cancer in her mother; Heart attack in her father; Heart attack (age of onset: 52) in her brother.    Review of Systems: Review of Systems  Constitutional: Negative.    Respiratory: Negative.   Cardiovascular: Negative.   Gastrointestinal: Negative.   Musculoskeletal: Positive for back pain.  Skin:       bruising  Neurological: Negative.   Psychiatric/Behavioral: Negative.   All other systems reviewed and are negative.   PHYSICAL EXAM: VS:  BP 120/82 (BP Location: Left Arm, Patient Position: Sitting, Cuff Size: Normal)   Pulse 81   Ht 5\' 10"  (1.778 m)   Wt 250 lb 4 oz (113.5 kg)   BMI 35.91 kg/m  , BMI Body mass index is 35.91 kg/m.  No significant change in exam Constitutional:  oriented to person, place, and time. No distress.  HENT:  Head: Normocephalic and atraumatic.  Eyes:  no discharge. No scleral icterus.  Neck: Normal range of motion. Neck supple. No JVD present.  Cardiovascular: Normal rate, regular rhythm, normal heart sounds and intact distal pulses. Exam reveals no gallop and no friction rub. No edema No murmur heard. Pulmonary/Chest: Effort normal and breath sounds normal. No stridor. No respiratory distress.  no wheezes.  no  rales.  no tenderness.  Abdominal: Soft.  no distension.  no tenderness.  Musculoskeletal: Normal range of motion.  no  tenderness or deformity.  Neurological:  normal muscle tone. Coordination normal. No atrophy Skin: Skin is warm and dry. No rash noted. not diaphoretic.  Psychiatric:  normal mood and affect. behavior is normal. Thought content normal.    Recent Labs: 09/04/2017: ALT 28 03/10/2018: BUN 21; Creatinine, Ser 0.94; Hemoglobin 14.3; Platelets 331; Potassium 3.5; Sodium 141    Lipid Panel Lab Results  Component Value Date   CHOL 193 09/04/2017   HDL 48 09/04/2017   LDLCALC 89 09/04/2017   TRIG 281 (H) 09/04/2017      Wt Readings from Last 3 Encounters:  06/19/18 250 lb 4 oz (113.5 kg)  03/11/18 250 lb (113.4 kg)  03/03/18 250 lb (113.4 kg)       ASSESSMENT AND PLAN:  Non-STEMI (non-ST elevated myocardial infarction) ([place]) Currently with no symptoms of angina. No further  workup at this time. Continue current medication regimen. Stable Long discussion concerning her Plavix and bruising Discussed the data with her, no strong indication to stay on Plavix but would stay on aspirin She has indicated she might take Plavix every other day or half pill daily, nervous to stop it altogether  Mixed hyperlipidemia She is currently on Lipitor 80 Unable to tolerate Zetia, cause diarrhea Repeat lipid panel ordered for next Thursday of August 1  Essential hypertension Blood pressure is well controlled on today's visit. No changes made to the medications. Stable  Morbid obesity Active at baseline, recommended diet modification Walking program  Disposition:   F/U  12 months   Total encounter time more than 25 minutes  Greater than 50% was spent in counseling and coordination of care with the patient    No orders of the defined types were placed in this encounter.    Signed, Esmond Plants, M.D., Ph.D. 06/19/2018  [place], [place]

## 2018-06-19 ENCOUNTER — Ambulatory Visit (INDEPENDENT_AMBULATORY_CARE_PROVIDER_SITE_OTHER): Payer: Medicare Other | Admitting: Cardiovascular Disease

## 2018-06-19 ENCOUNTER — Encounter: Payer: Self-pay | Admitting: Cardiovascular Disease

## 2018-06-19 VITALS — BP 120/82 | HR 81 | Ht 70.0 in | Wt 250.2 lb

## 2018-06-19 DIAGNOSIS — I25118 Atherosclerotic heart disease of native coronary artery with other forms of angina pectoris: Secondary | ICD-10-CM

## 2018-06-19 DIAGNOSIS — I10 Essential (primary) hypertension: Secondary | ICD-10-CM | POA: Diagnosis not present

## 2018-06-19 DIAGNOSIS — E782 Mixed hyperlipidemia: Secondary | ICD-10-CM

## 2018-06-19 DIAGNOSIS — I214 Non-ST elevation (NSTEMI) myocardial infarction: Secondary | ICD-10-CM

## 2018-06-19 DIAGNOSIS — M48062 Spinal stenosis, lumbar region with neurogenic claudication: Secondary | ICD-10-CM

## 2018-06-19 MED ORDER — POTASSIUM CHLORIDE ER 10 MEQ PO TBCR: 10.0000 meq | EXTENDED_RELEASE_TABLET | Freq: Every day | ORAL | 3 refills | Status: DC

## 2018-06-19 NOTE — Patient Instructions (Addendum)
Medication Instructions:   Start potassium one a day (Take 2 pills the first three days)  Labwork:  Fasting lipids and LFTS next Thursday  Testing/Procedures:  No further testing at this time   Follow-Up: It was a pleasure seeing you in the office today. Please call us if you have new issues that need to be addressed before your next appt.  [phone]  Your physician wants you to follow-up in: 12 months.  You will receive a reminder letter in the mail two months in advance. If you don't receive a letter, please call our office to schedule the follow-up appointment.  If you need a refill on your cardiac medications before your next appointment, please call your pharmacy.  For educational health videos Log in to : [email or web address] Or : [email or web address], password : [email or web address]

## 2018-06-26 ENCOUNTER — Other Ambulatory Visit (INDEPENDENT_AMBULATORY_CARE_PROVIDER_SITE_OTHER): Payer: Medicare Other

## 2018-06-26 DIAGNOSIS — I10 Essential (primary) hypertension: Secondary | ICD-10-CM

## 2018-06-26 DIAGNOSIS — I214 Non-ST elevation (NSTEMI) myocardial infarction: Secondary | ICD-10-CM

## 2018-06-26 DIAGNOSIS — E782 Mixed hyperlipidemia: Secondary | ICD-10-CM

## 2018-06-27 LAB — HEPATIC FUNCTION PANEL
ALT: 22 IU/L (ref 0–32)
AST: 20 IU/L (ref 0–40)
Albumin: 4.4 g/dL (ref 3.5–4.8)
Alkaline Phosphatase: 169 IU/L — ABNORMAL HIGH (ref 39–117)
Bilirubin Total: 0.5 mg/dL (ref 0.0–1.2)
Bilirubin, Direct: 0.13 mg/dL (ref 0.00–0.40)
Total Protein: 6.9 g/dL (ref 6.0–8.5)

## 2018-06-27 LAB — LIPID PANEL
Chol/HDL Ratio: 3.3 ratio (ref 0.0–4.4)
Cholesterol, Total: 169 mg/dL (ref 100–199)
HDL: 51 mg/dL (ref >39)
LDL Calculated: 88 mg/dL (ref 0–99)
Triglycerides: 151 mg/dL — ABNORMAL HIGH (ref 0–149)
VLDL Cholesterol Cal: 30 mg/dL (ref 5–40)

## 2018-06-30 ENCOUNTER — Telehealth: Payer: Self-pay | Admitting: Cardiovascular Disease

## 2018-06-30 NOTE — Telephone Encounter (Signed)
Patient calling to check on status of lab results Please call when available

## 2018-06-30 NOTE — Telephone Encounter (Signed)
Patient would like lab results from 06/26/18. Preliminary reviewed with patient and she is aware that we will let her know final results once Dr Rockey Situ reviews. She was appreciative.

## 2018-07-02 ENCOUNTER — Telehealth: Payer: Self-pay | Admitting: Cardiovascular Disease

## 2018-07-02 ENCOUNTER — Other Ambulatory Visit: Payer: Medicare Other | Admitting: Orthotics

## 2018-07-02 NOTE — Telephone Encounter (Signed)
Patient returning call  States she will be at work til [employer] and will not be able to take a call Please leave a message

## 2018-07-02 NOTE — Telephone Encounter (Signed)
No answer. Left message to call back.   

## 2018-07-02 NOTE — Telephone Encounter (Signed)
Patient returning our call for lab results  Patient also would like [place] to know she spoke to her son Patient states she and Dr Rockey Situ spoke about her stopping her Plavix and he suggested that she could stop now or in 1M  Patient states her son advised her to not stop it. She is just letting us know she will stay on it

## 2018-07-03 MED ORDER — CLOPIDOGREL BISULFATE 75 MG PO TABS: 75.0000 mg | ORAL_TABLET | Freq: Every day | ORAL | 1 refills | Status: DC

## 2018-07-03 MED ORDER — EZETIMIBE 10 MG PO TABS: 10.0000 mg | ORAL_TABLET | Freq: Every day | ORAL | Status: DC

## 2018-07-03 NOTE — Telephone Encounter (Signed)
Patient returning call She really would like whatever it is to be left in a message, she has a hard time getting to phone calls and is frustrated with the back and forth calls

## 2018-07-03 NOTE — Telephone Encounter (Signed)
No answer. Left message to call back.   

## 2018-07-03 NOTE — Telephone Encounter (Signed)
I spoke with the patient and she is aware of her lab results and Dr. Donivan Scull recommendations.  She request that her plavix refill be sent in as well. I advised I will send this to the [facility] on [address].

## 2018-07-08 ENCOUNTER — Emergency Department
Admission: EM | Admit: 2018-07-08 | Discharge: 2018-07-09 | Disposition: A | Discharge status: Home / Self Care | Payer: Medicare Other | Attending: Emergency Medicine | Admitting: Emergency Medicine

## 2018-07-08 ENCOUNTER — Emergency Department: Payer: Medicare Other

## 2018-07-08 ENCOUNTER — Other Ambulatory Visit: Payer: Self-pay

## 2018-07-08 DIAGNOSIS — I252 Old myocardial infarction: Secondary | ICD-10-CM | POA: Insufficient documentation

## 2018-07-08 DIAGNOSIS — I2510 Atherosclerotic heart disease of native coronary artery without angina pectoris: Secondary | ICD-10-CM | POA: Diagnosis not present

## 2018-07-08 DIAGNOSIS — R079 Chest pain, unspecified: Secondary | ICD-10-CM | POA: Diagnosis not present

## 2018-07-08 DIAGNOSIS — Z79899 Other long term (current) drug therapy: Secondary | ICD-10-CM | POA: Insufficient documentation

## 2018-07-08 DIAGNOSIS — Z85038 Personal history of other malignant neoplasm of large intestine: Secondary | ICD-10-CM | POA: Insufficient documentation

## 2018-07-08 DIAGNOSIS — Z91040 Latex allergy status: Secondary | ICD-10-CM | POA: Diagnosis not present

## 2018-07-08 DIAGNOSIS — Z7902 Long term (current) use of antithrombotics/antiplatelets: Secondary | ICD-10-CM | POA: Insufficient documentation

## 2018-07-08 DIAGNOSIS — Z7982 Long term (current) use of aspirin: Secondary | ICD-10-CM | POA: Insufficient documentation

## 2018-07-08 DIAGNOSIS — I10 Essential (primary) hypertension: Secondary | ICD-10-CM | POA: Diagnosis not present

## 2018-07-08 LAB — CBC
HCT: 39.1 % (ref 35.0–47.0)
Hemoglobin: 13.5 g/dL (ref 12.0–16.0)
MCH: 29.3 pg (ref 26.0–34.0)
MCHC: 34.4 g/dL (ref 32.0–36.0)
MCV: 85.1 fL (ref 80.0–100.0)
Platelets: 310 10*3/uL (ref 150–440)
RBC: 4.59 MIL/uL (ref 3.80–5.20)
RDW: 15 % — ABNORMAL HIGH (ref 11.5–14.5)
WBC: 8 10*3/uL (ref 3.6–11.0)
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
## 2018-07-09 LAB — COMPREHENSIVE METABOLIC PANEL
ALT: 24 U/L (ref 0–44)
AST: 28 U/L (ref 15–41)
Albumin: 4.4 g/dL (ref 3.5–5.0)
Alkaline Phosphatase: 129 U/L — ABNORMAL HIGH (ref 38–126)
Anion gap: 12 (ref 5–15)
BUN: 27 mg/dL — ABNORMAL HIGH (ref 8–23)
CO2: 29 mmol/L (ref 22–32)
Calcium: 9.4 mg/dL (ref 8.9–10.3)
Chloride: 102 mmol/L (ref 98–111)
Creatinine, Ser: 1.15 mg/dL — ABNORMAL HIGH (ref 0.44–1.00)
GFR calc Af Amer: 54 mL/min — ABNORMAL LOW (ref >60)
GFR calc non Af Amer: 46 mL/min — ABNORMAL LOW (ref >60)
Glucose, Bld: 113 mg/dL — ABNORMAL HIGH (ref 70–99)
Potassium: 2.8 mmol/L — ABNORMAL LOW (ref 3.5–5.1)
Sodium: 143 mmol/L (ref 135–145)
Total Bilirubin: 1.1 mg/dL (ref 0.3–1.2)
Total Protein: 7.4 g/dL (ref 6.5–8.1)

## 2018-07-09 LAB — TROPONIN I: Troponin I: <0.03 ng/mL (ref <0.03)

## 2018-07-31 ENCOUNTER — Encounter (INDEPENDENT_AMBULATORY_CARE_PROVIDER_SITE_OTHER): Payer: Medicare Other | Admitting: Ophthalmology

## 2018-09-25 DIAGNOSIS — M545 Low back pain, unspecified: Secondary | ICD-10-CM | POA: Insufficient documentation

## 2018-09-30 ENCOUNTER — Other Ambulatory Visit: Payer: Self-pay | Admitting: Family Medicine

## 2018-09-30 DIAGNOSIS — Z1231 Encounter for screening mammogram for malignant neoplasm of breast: Secondary | ICD-10-CM

## 2018-10-02 ENCOUNTER — Emergency Department
Admission: EM | Admit: 2018-10-02 | Discharge: 2018-10-03 | Disposition: A | Discharge status: Home / Self Care | Payer: Medicare Other | Attending: Emergency Medicine | Admitting: Emergency Medicine

## 2018-10-02 ENCOUNTER — Other Ambulatory Visit: Payer: Self-pay

## 2018-10-02 ENCOUNTER — Emergency Department: Payer: Medicare Other

## 2018-10-02 DIAGNOSIS — E876 Hypokalemia: Secondary | ICD-10-CM | POA: Insufficient documentation

## 2018-10-02 DIAGNOSIS — Z7982 Long term (current) use of aspirin: Secondary | ICD-10-CM | POA: Insufficient documentation

## 2018-10-02 DIAGNOSIS — Z79899 Other long term (current) drug therapy: Secondary | ICD-10-CM | POA: Diagnosis not present

## 2018-10-02 DIAGNOSIS — N390 Urinary tract infection, site not specified: Secondary | ICD-10-CM | POA: Diagnosis not present

## 2018-10-02 DIAGNOSIS — I10 Essential (primary) hypertension: Secondary | ICD-10-CM | POA: Diagnosis not present

## 2018-10-02 DIAGNOSIS — Z85038 Personal history of other malignant neoplasm of large intestine: Secondary | ICD-10-CM | POA: Insufficient documentation

## 2018-10-02 DIAGNOSIS — I2510 Atherosclerotic heart disease of native coronary artery without angina pectoris: Secondary | ICD-10-CM | POA: Insufficient documentation

## 2018-10-02 DIAGNOSIS — Z7902 Long term (current) use of antithrombotics/antiplatelets: Secondary | ICD-10-CM | POA: Diagnosis not present

## 2018-10-02 DIAGNOSIS — R079 Chest pain, unspecified: Secondary | ICD-10-CM | POA: Diagnosis present

## 2018-10-02 LAB — COMPREHENSIVE METABOLIC PANEL
ALT: 26 U/L (ref 0–44)
AST: 26 U/L (ref 15–41)
Albumin: 4 g/dL (ref 3.5–5.0)
Alkaline Phosphatase: 125 U/L (ref 38–126)
Anion gap: 7 (ref 5–15)
BUN: 25 mg/dL — ABNORMAL HIGH (ref 8–23)
CO2: 31 mmol/L (ref 22–32)
Calcium: 9.3 mg/dL (ref 8.9–10.3)
Chloride: 102 mmol/L (ref 98–111)
Creatinine, Ser: 0.98 mg/dL (ref 0.44–1.00)
GFR calc Af Amer: >60 mL/min (ref >60)
GFR calc non Af Amer: 56 mL/min — ABNORMAL LOW (ref >60)
Glucose, Bld: 137 mg/dL — ABNORMAL HIGH (ref 70–99)
Potassium: 3.1 mmol/L — ABNORMAL LOW (ref 3.5–5.1)
Sodium: 140 mmol/L (ref 135–145)
Total Bilirubin: 0.6 mg/dL (ref 0.3–1.2)
Total Protein: 7.1 g/dL (ref 6.5–8.1)

## 2018-10-02 LAB — CBC
HCT: 38.8 % (ref 36.0–46.0)
Hemoglobin: 12.6 g/dL (ref 12.0–15.0)
MCH: 29.2 pg (ref 26.0–34.0)
MCHC: 32.5 g/dL (ref 30.0–36.0)
MCV: 90 fL (ref 80.0–100.0)
Platelets: 340 10*3/uL (ref 150–400)
RBC: 4.31 MIL/uL (ref 3.87–5.11)
RDW: 13.6 % (ref 11.5–15.5)
WBC: 7.4 10*3/uL (ref 4.0–10.5)
nRBC: 0 % (ref 0.0–0.2)

## 2018-10-02 LAB — LIPASE, BLOOD: Lipase: 50 U/L (ref 11–51)

## 2018-10-02 LAB — TROPONIN I: Troponin I: <0.03 ng/mL (ref <0.03)

## 2018-10-02 MED ORDER — FAMOTIDINE IN NACL 20-0.9 MG/50ML-% IV SOLN
20.0000 mg | Freq: Once | INTRAVENOUS | Status: AC
  Administered 2018-10-02: 20 mg via INTRAVENOUS
  Filled 2018-10-02: qty 50

## 2018-10-02 MED ORDER — ONDANSETRON HCL 4 MG/2ML IJ SOLN
4.0000 mg | Freq: Once | INTRAMUSCULAR | Status: AC
  Administered 2018-10-02: 4 mg via INTRAVENOUS
  Filled 2018-10-02: qty 2

## 2018-10-02 MED ORDER — SODIUM CHLORIDE 0.9 % IV BOLUS
500.0000 mL | Freq: Once | INTRAVENOUS | Status: AC
  Administered 2018-10-02: 500 mL via INTRAVENOUS
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
## 2018-10-02 NOTE — Discharge Instructions (Signed)
Call Dr. Rockey Situ tomorrow morning to arrange for close follow-up within the next several days.  Return to the ER immediately for new, worsening, or persistent chest pain, difficulty breathing, weakness or lightheadedness, nausea or vomiting, or any other new or worsening symptoms that concern you.

## 2018-10-02 NOTE — ED Provider Notes (Signed)
[facility] Emergency Department Provider Note ____________________________________________   First MD Initiated Contact with Patient 10/02/18 2136     (approximate)  I have reviewed the triage vital signs and the nursing notes.   HISTORY  Chief Complaint Chest Pain    HPI Carrie Larsen is a 72 y.o. female with PMH as noted below including CAD status post MI and stenting in 2018 who presents with chest pain, acute onset approximately 20 hours ago, somewhat improved during the day, and then worsening this evening, described as pressure and vague squeezing, associated with left arm discomfort, and associated with nausea and shortness of breath.  She denies prior history of this pain.  It is not exertional.   Past Medical History:  Diagnosis Date  . CAD (coronary artery disease)    a. 2012 ETT: no ischemia;  b. 06/2017 NSTEMI/PCI: LM nl, LAD nl, D1 70-80p, LCX nl, OM1 90 (3.0 x 23 Xience [place]), RCA dominant, 100p/m, fills via L->R collats, EF 55-65%.  . Cancer [facility])    a. s/p partial left nephrectomy.  . Colon cancer ([place]) 1992   T3, N1, M0.  . Colon polyp 2011  . Diastolic dysfunction    a. 08/2015 Echo: EF 60-65%, no rwma, Gr1 DD, midlly dil LA, PASP 71mmHg. No significant valvular dzs; b. 06/2017 Echo: EF 60-65%, Gr1DD, mildly dil LA.  . Essential hypertension   . GERD (gastroesophageal reflux disease)   . History of kidney stones    passed - 2  . Hyperlipidemia   . Lumbar spinal stenosis   . Myocardial infarction ([place])    06/2017  . Retinal cyst     Patient Active Problem List   Diagnosis Date Noted  . 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 Morbid obesity ([place]) 09/11/2017  . Non-STEMI (non-ST elevated myocardial infarction) ([place]) 06/27/2017  . Abdominal pain 02/28/2017  . Left leg swelling 08/22/2015  . Systolic murmur   . Retrosternal chest pain 07/08/2015  . History of colonic polyps 04/16/2015  . Chest pain  03/08/2011  . Hyperlipidemia 03/08/2011  . HTN (hypertension) 03/08/2011    Past Surgical History:  Procedure Laterality Date  . ABDOMINAL HYSTERECTOMY    . APPENDECTOMY    . CATARACT EXTRACTION W/PHACO Left 04/24/2016   Procedure: CATARACT EXTRACTION PHACO AND INTRAOCULAR LENS PLACEMENT (IOC);  Surgeon: Birder Robson, MD;  Location: [facility] ORS;  Service: Ophthalmology;  Laterality: Left;  [place] 45.8 AP% 16.4 CDE 7.53 FLUID PACK LOT # [id number]  . CATARACT EXTRACTION W/PHACO Right 06/05/2016   Procedure: CATARACT EXTRACTION PHACO AND INTRAOCULAR LENS PLACEMENT (IOC);  Surgeon: Birder Robson, MD;  Location: [facility] ORS;  Service: Ophthalmology;  Laterality: Right;  [place] 25.9 AP% 21.9 CDE 5.68 Fluid pack lot # [id number]  . COLON RESECTION  03/04/1991  . COLON SURGERY    . COLONOSCOPY  03-14-10   Dr Bary Castilla, tubular adenoma at 25 cm.  . COLONOSCOPY WITH PROPOFOL N/A 06/07/2015   Procedure: COLONOSCOPY WITH PROPOFOL;  Surgeon: Robert Bellow, MD;  Location: [facility] ENDOSCOPY;  Service: Endoscopy;  Laterality: N/A;  . CORONARY STENT INTERVENTION N/A 06/28/2017   Procedure: CORONARY STENT INTERVENTION;  Surgeon: Yolonda Kida, MD;  Location: [place] CV LAB;  Service: Cardiovascular;  Laterality: N/A;  . HERNIA REPAIR  5035   umbilical  . LEFT HEART CATH AND CORONARY ANGIOGRAPHY N/A 06/28/2017   Procedure: LEFT HEART CATH AND CORONARY ANGIOGRAPHY;  Surgeon: Minna Merritts, MD;  Location: [place] CV LAB;  Service: Cardiovascular;  Laterality: N/A;  . LUMBAR LAMINECTOMY/DECOMPRESSION MICRODISCECTOMY Bilateral 03/10/2018   Procedure: Laminectomy and Foraminotomy - Lumbar three-Lumbar four - Lumbar four-Lumbar five - bilateral;  Surgeon: Earnie Larsson, MD;  Location: [place];  Service: Neurosurgery;  Laterality: Bilateral;  . NEPHRECTOMY  1992   left- cancer  . RIB RESECTION     removal 4 ribs  . TONSILLECTOMY      Prior to Admission medications   Medication Sig Start Date End Date  Taking? Authorizing Provider  aspirin EC 81 MG tablet Take 1 tablet (81 mg total) by mouth daily. 07/08/17   Theora Gianotti, NP  atorvastatin (LIPITOR) 80 MG tablet TAKE 1 TABLET BY MOUTH ONCE DAILY AT [facility] 04/11/18   Minna Merritts, MD  carvedilol (COREG) 12.5 MG tablet TAKE 1 TABLET BY MOUTH TWICE DAILY WITH A MEAL 04/11/18   Gollan, Kathlene November, MD  clopidogrel (PLAVIX) 75 MG tablet Take 1 tablet (75 mg total) by mouth daily. 07/03/18   Minna Merritts, MD  cyclobenzaprine (FLEXERIL) 10 MG tablet Take 1 tablet (10 mg total) by mouth 3 (three) times daily as needed for muscle spasms. 03/11/18   Earnie Larsson, MD  esomeprazole (NEXIUM) 40 MG capsule Take 40 mg by mouth daily at 3 pm.     [provider]  ezetimibe (ZETIA) 10 MG tablet Take 1 tablet (10 mg total) by mouth daily. 07/03/18 10/01/18  Minna Merritts, MD  furosemide (LASIX) 20 MG tablet Take 20 mg by mouth every morning. 05/06/17   [provider]  hydrochlorothiazide (HYDRODIURIL) 25 MG tablet Take 25 mg by mouth daily at 3 pm.  04/15/16   [provider]  isosorbide mononitrate (IMDUR) 30 MG 24 hr tablet Take 1 tablet (30 mg total) by mouth daily. 07/24/17   Minna Merritts, MD  Multiple Vitamins-Minerals (MULTIVITAMIN WITH MINERALS) tablet Take 1 tablet by mouth daily.    [provider]  nitroGLYCERIN (NITROSTAT) 0.4 MG SL tablet Place 1 tablet (0.4 mg total) under the tongue every 5 (five) minutes x 3 doses as needed for chest pain. 06/29/17   Epifanio Lesches, MD  potassium chloride (K-DUR) 10 MEQ tablet Take 1 tablet (10 mEq total) by mouth daily. 06/19/18   Minna Merritts, MD    Allergies Morphine and related and Latex  Family History  Problem Relation Age of Onset  . Colon cancer Mother   . Cerebral aneurysm Father   . Heart attack Father   . Heart attack Brother 54    Social History Social History   Tobacco Use  . Smoking status: Never Smoker  . Smokeless tobacco: Never  Used  Substance Use Topics  . Alcohol use: No  . Drug use: No    Review of Systems  Constitutional: No fever. Eyes: No redness. ENT: No neck pain. Cardiovascular: Positive for chest pain. Respiratory: Positive for shortness of breath. Gastrointestinal: Positive for nausea.  Genitourinary: Negative for flank pain.  Musculoskeletal: Negative for back pain. Skin: Negative for rash. Neurological: Negative for headache.   ____________________________________________   PHYSICAL EXAM:  VITAL SIGNS: ED Triage Vitals [10/02/18 2132]  Enc Vitals Group     BP 132/76     Pulse Rate 79     Resp 20     Temp 98.4 F (36.9 C)     Temp Source Oral     SpO2 99 %     Weight 250 lb (113.4 kg)     Height 5\' 10"  (  1.778 m)     Head Circumference      Peak Flow      Pain Score 5     Pain Loc      Pain Edu?      Excl. in [place]?     Constitutional: Alert and oriented.  Relatively well appearing and in no acute distress. Eyes: Conjunctivae are normal.  Head: Atraumatic. Nose: No congestion/rhinnorhea. Mouth/Throat: Mucous membranes are moist.   Neck: Normal range of motion.  Cardiovascular: Normal rate, regular rhythm. Grossly normal heart sounds.  Good peripheral circulation. Respiratory: Normal respiratory effort.  No retractions. Lungs CTAB. Gastrointestinal: Soft with mild epigastric tenderness.  No distention.  Musculoskeletal: No lower extremity edema.  Extremities warm and well perfused.  Neurologic:  Normal speech and language. No gross focal neurologic deficits are appreciated.  Skin:  Skin is warm and dry. No rash noted. Psychiatric: Mood and affect are normal. Speech and behavior are normal.  ____________________________________________   LABS (all labs ordered are listed, but only abnormal results are displayed)  Labs Reviewed  COMPREHENSIVE METABOLIC PANEL - Abnormal; Notable for the following components:      Result Value   Potassium 3.1 (*)    Glucose, Bld 137 (*)     BUN 25 (*)    GFR calc non Af Amer 56 (*)    All other components within normal limits  CBC  TROPONIN I  LIPASE, BLOOD  URINALYSIS, COMPLETE (UACMP) WITH MICROSCOPIC  TROPONIN I   ____________________________________________  EKG  ED ECG REPORT I, Arta Silence, the attending physician, personally viewed and interpreted this ECG.  Date: 10/02/2018 EKG Time: 2132 Rate: 79 Rhythm: normal sinus rhythm QRS Axis: normal Intervals: normal ST/T Wave abnormalities: No acute abnormalities Narrative Interpretation: no evidence of acute ischemia; no significant change when compared to EKG of 02/05/2018  ____________________________________________  RADIOLOGY  CXR: Bibasilar atelectasis with no focal infiltrate  ____________________________________________   PROCEDURES  Procedure(s) performed: No  Procedures  Critical Care performed: No ____________________________________________   INITIAL IMPRESSION / ASSESSMENT AND PLAN / ED COURSE  Pertinent labs & imaging results that were available during my care of the patient were reviewed by me and considered in my medical decision making (see chart for details).  72 year old female with PMH as noted above including CAD history with a stent placed last year presents with chest/epigastric area pain for almost a whole day, with radiation to the left arm and some nausea and shortness of breath.  On exam, the patient is relatively comfortable appearing and her vital signs are normal.  She does have mild epigastric discomfort to palpation.  The remainder of the exam is unremarkable.  EKG shows no acute changes.  Overall given the patient's significant CAD history she is at elevated risk for ACS.  However given the somewhat epigastric location of the pain, the differential also includes gastritis, GERD, or other GI cause, or musculoskeletal pain.  There is no clinical evidence for DVT or PE.  There is also no evidence for aortic  dissection or other vascular etiology.  I will obtain basic labs, cardiac enzymes, chest x-ray, give symptomatic treatment with nausea medication and Pepcid, and reassess.  The patient already received aspirin by EMS.  Disposition will be based on the results of the work-up and the patient's symptoms.  ----------------------------------------- 11:16 PM on 10/02/2018 -----------------------------------------  The patient's pain and nausea are significantly improved.  She states she still feels slightly lightheaded so I am giving fluids.  I had  an extensive discussion with her about her ACS risk and the work-up and plan.  Overall I suspect a GI cause is more likely than ACS and the negative first troponin and normal EKG are reassuring.  But the patient still has elevated risk for ACS.  We agreed to observe the patient and obtain a second troponin.  I am signing the patient out to the oncoming physician Dr. Beather Arbour.  The plan will be that if the patient's symptoms continue to improve and her repeat troponin is negative, she will be discharged home and call Dr. Rockey Situ tomorrow.  If she has persistent discomfort or an abnormal troponin, will admit. ____________________________________________   FINAL CLINICAL IMPRESSION(S) / ED DIAGNOSES  Final diagnoses:  Chest pain, unspecified type      NEW MEDICATIONS STARTED DURING THIS VISIT:  New Prescriptions   No medications on file     Note:  This document was prepared using Dragon voice recognition software and may include unintentional dictation errors.    Arta Silence, MD 10/02/18 [phone]

## 2018-10-03 DIAGNOSIS — E876 Hypokalemia: Secondary | ICD-10-CM | POA: Diagnosis not present

## 2018-10-03 LAB — URINALYSIS, COMPLETE (UACMP) WITH MICROSCOPIC
Bacteria, UA: NONE SEEN
Bilirubin Urine: NEGATIVE
Glucose, UA: NEGATIVE mg/dL
Hgb urine dipstick: NEGATIVE
Ketones, ur: NEGATIVE mg/dL
Leukocytes, UA: NEGATIVE
Nitrite: POSITIVE — AB
Protein, ur: NEGATIVE mg/dL
Specific Gravity, Urine: 1.018 (ref 1.005–1.030)
pH: 5 (ref 5.0–8.0)

## 2018-10-03 LAB — TROPONIN I: Troponin I: <0.03 ng/mL (ref <0.03)

## 2018-10-03 MED ORDER — POTASSIUM CHLORIDE CRYS ER 20 MEQ PO TBCR
40.0000 meq | EXTENDED_RELEASE_TABLET | Freq: Once | ORAL | Status: AC
  Administered 2018-10-03: 40 meq via ORAL
  Filled 2018-10-03: qty 2

## 2018-10-03 MED ORDER — FOSFOMYCIN TROMETHAMINE 3 G PO PACK
3.0000 g | PACK | Freq: Once | ORAL | Status: AC
  Administered 2018-10-03: 3 g via ORAL
  Filled 2018-10-03: qty 3

## 2018-10-03 NOTE — ED Provider Notes (Signed)
-----------------------------------------   1:13 AM on 10/03/2018 -----------------------------------------  Repeat troponin remains unremarkable.  Fosfomycin given for nitrite positive UTI.  Potassium repleted.  She will follow-up with her cardiologist closely.  Strict return precautions given.  Patient verbalizes understanding agrees with plan of care.   Paulette Blanch, MD 10/03/18 [phone]

## 2018-10-06 ENCOUNTER — Telehealth: Payer: Self-pay | Admitting: Cardiovascular Disease

## 2018-10-06 NOTE — Telephone Encounter (Signed)
Patient calling to schedule follow up  Scheduled to see Carrie Larsen on 12/5, has to be scheduled on a Thursday Would like to know if this is too far or if her symptoms indicate anything too terribly serious where she should be seen sooner Please call to discuss, states it is ok to leave a detailed message as she will be at work

## 2018-10-06 NOTE — Telephone Encounter (Signed)
Lmov for patient to call and schedule ED fu  She was seen on 10/02/18 for CP  Will try again at a later time

## 2018-10-07 NOTE — Telephone Encounter (Signed)
Returned the call to the patient. Appointment has been moved to 3:30 on 12/5 with Angelica Ran, NP per the patient request.

## 2018-10-15 NOTE — Progress Notes (Addendum)
10/16/2018 8:37 PM   Gemma Payor [facility] [birth year] [id number]  Referring provider: Lavonne Chick, MD [address] [place], [place] [address]  Chief Complaint  Patient presents with   Establish Care    HPI: Carrie Larsen is a 72 yo F who presents today to establish urological care. She was last seen by Dr. Jacqlyn Larsen from [facility] on 02/20/2017 for nephrolithiasis. 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 She has a h/o recurrent stone disease.  She was treated 2 weeks ago for a UTI in the ED. She reports of having urgency at night for the past 6 weeks and but does note increased fluids prior to bedtime. She reports feeling "awful" for the last couple of weeks. She denies any kidney stone symptoms. Her UA is normal.   PMH: Past Medical History:  Diagnosis Date   CAD (coronary artery disease)    a. 2012 ETT: no ischemia;  b. 06/2017 NSTEMI/PCI: LM nl, LAD nl, D1 70-80p, LCX nl, OM1 90 (3.0 x 23 Xience [place]), RCA dominant, 100p/m, fills via L->R collats, EF 55-65%.   Cancer [facility])    a. s/p partial left nephrectomy.   Colon cancer ([place]) 1992   T3, N1, M0.   Colon polyp 5053   Diastolic dysfunction    a. 08/2015 Echo: EF 60-65%, no rwma, Gr1 DD, midlly dil LA, PASP 20mmHg. No significant valvular dzs; b. 06/2017 Echo: EF 60-65%, Gr1DD, mildly dil LA.   Essential hypertension    GERD (gastroesophageal reflux disease)    History of kidney stones    passed - 2   Hyperlipidemia    Lumbar spinal stenosis    Myocardial infarction ([place])    06/2017   Retinal cyst     Surgical History: Past Surgical History:  Procedure Laterality Date   ABDOMINAL HYSTERECTOMY     APPENDECTOMY     CATARACT EXTRACTION W/PHACO Left 04/24/2016   Procedure: CATARACT EXTRACTION PHACO AND INTRAOCULAR LENS PLACEMENT ([place]);  Surgeon: Birder Robson, MD;  Location: [facility] ORS;  Service: Ophthalmology;  Laterality: Left;  [place] 45.8 AP% 16.4 CDE 7.53 FLUID PACK LOT # [id number]   CATARACT  EXTRACTION W/PHACO Right 06/05/2016   Procedure: CATARACT EXTRACTION PHACO AND INTRAOCULAR LENS PLACEMENT (IOC);  Surgeon: Birder Robson, MD;  Location: [facility] ORS;  Service: Ophthalmology;  Laterality: Right;  [place] 25.9 AP% 21.9 CDE 5.68 Fluid pack lot # [id number]   COLON RESECTION  03/04/1991   COLON SURGERY     COLONOSCOPY  03-14-10   Dr Bary Castilla, tubular adenoma at 25 cm.   COLONOSCOPY WITH PROPOFOL N/A 06/07/2015   Procedure: COLONOSCOPY WITH PROPOFOL;  Surgeon: Robert Bellow, MD;  Location: [facility] ENDOSCOPY;  Service: Endoscopy;  Laterality: N/A;   CORONARY STENT INTERVENTION N/A 06/28/2017   Procedure: CORONARY STENT INTERVENTION;  Surgeon: Yolonda Kida, MD;  Location: [place] CV LAB;  Service: Cardiovascular;  Laterality: N/A;   HERNIA REPAIR  0973   umbilical   LEFT HEART CATH AND CORONARY ANGIOGRAPHY N/A 06/28/2017   Procedure: LEFT HEART CATH AND CORONARY ANGIOGRAPHY;  Surgeon: Minna Merritts, MD;  Location: [place] CV LAB;  Service: Cardiovascular;  Laterality: N/A;   LUMBAR LAMINECTOMY/DECOMPRESSION MICRODISCECTOMY Bilateral 03/10/2018   Procedure: Laminectomy and Foraminotomy - Lumbar three-Lumbar four - Lumbar four-Lumbar five - bilateral;  Surgeon: Earnie Larsson, MD;  Location: [place];  Service: Neurosurgery;  Laterality: Bilateral;   NEPHRECTOMY  1992   left- cancer   RIB RESECTION  removal 4 ribs   TONSILLECTOMY      Home Medications:  Allergies as of 10/16/2018      Reactions   Morphine And Related Nausea Only   Latex Rash      Medication List        Accurate as of 10/16/18  8:37 PM. Always use your most recent med list.          aspirin EC 81 MG tablet Take 1 tablet (81 mg total) by mouth daily.   atorvastatin 80 MG tablet Commonly known as:  LIPITOR TAKE 1 TABLET BY MOUTH ONCE DAILY AT 6PM   carvedilol 12.5 MG tablet Commonly known as:  COREG TAKE 1 TABLET BY MOUTH TWICE DAILY WITH A MEAL   clopidogrel 75 MG  tablet Commonly known as:  PLAVIX Take 1 tablet (75 mg total) by mouth daily.   cyclobenzaprine 10 MG tablet Commonly known as:  FLEXERIL Take 1 tablet (10 mg total) by mouth 3 (three) times daily as needed for muscle spasms.   esomeprazole 40 MG capsule Commonly known as:  NEXIUM Take 40 mg by mouth daily at 3 pm.   ezetimibe 10 MG tablet Commonly known as:  ZETIA Take 1 tablet (10 mg total) by mouth daily.   furosemide 20 MG tablet Commonly known as:  LASIX Take 20 mg by mouth every morning.   hydrochlorothiazide 25 MG tablet Commonly known as:  HYDRODIURIL Take 25 mg by mouth daily at 3 pm.   isosorbide mononitrate 30 MG 24 hr tablet Commonly known as:  IMDUR Take 1 tablet (30 mg total) by mouth daily.   multivitamin with minerals tablet Take 1 tablet by mouth daily.   NEXIUM I.V. 40 MG injection Generic drug:  esomeprazole Nexium 40 mg intravenous solution  Inject by intravenous route.   nitroGLYCERIN 0.4 MG SL tablet Commonly known as:  NITROSTAT Place 1 tablet (0.4 mg total) under the tongue every 5 (five) minutes x 3 doses as needed for chest pain.   nortriptyline 10 MG capsule Commonly known as:  PAMELOR   potassium chloride 10 MEQ tablet Commonly known as:  K-DUR Take 1 tablet (10 mEq total) by mouth daily.   ticagrelor 90 MG Tabs tablet Commonly known as:  BRILINTA Take by mouth.   traMADol 50 MG tablet Commonly known as:  ULTRAM tramadol 50 mg tablet  Take 1 tablet(s) EVERY 6 HOURS by oral route PRN pain       Allergies:  Allergies  Allergen Reactions   Morphine And Related Nausea Only   Latex Rash    Family History: Family History  Problem Relation Age of Onset   Colon cancer Mother    Cerebral aneurysm Father    Heart attack Father    Heart attack Brother 96    Social History:  reports that she has never smoked. She has never used smokeless tobacco. She reports that she does not drink alcohol or use  drugs.  ROS: UROLOGY Frequent Urination?: Yes Hard to postpone urination?: Yes Burning/pain with urination?: No Get up at night to urinate?: Yes Leakage of urine?: No Urine stream starts and stops?: No Trouble starting stream?: No Do you have to strain to urinate?: No Blood in urine?: No Urinary tract infection?: Yes Sexually transmitted disease?: No Injury to kidneys or bladder?: No Painful intercourse?: No Weak stream?: No Currently pregnant?: No Vaginal bleeding?: No Last menstrual period?: Hysterectomy  Gastrointestinal Nausea?: No Vomiting?: No Indigestion/heartburn?: No Diarrhea?: No Constipation?: No  Constitutional Fever:  No Night sweats?: No Weight loss?: No Fatigue?: Yes  Skin Skin rash/lesions?: No Itching?: No  Eyes Blurred vision?: No Double vision?: No  Ears/Nose/Throat Sore throat?: No Sinus problems?: No  Hematologic/Lymphatic Swollen glands?: No Easy bruising?: No  Cardiovascular Leg swelling?: No Chest pain?: No  Respiratory Cough?: No Shortness of breath?: No  Endocrine Excessive thirst?: No  Musculoskeletal Back pain?: Yes Joint pain?: No  Neurological Headaches?: No Dizziness?: Yes  Psychologic Depression?: No Anxiety?: No  Physical Exam: BP (!) 129/54 (BP Location: Left Arm, Patient Position: Sitting, Cuff Size: Large)    Pulse 76    Ht 5\' 10"  (1.778 m)    Wt 258 lb 9.6 oz (117.3 kg)    BMI 37.11 kg/m   Constitutional:  Alert and oriented, No acute distress. HEENT: [place] AT, moist mucus membranes.  Trachea midline, no masses. Cardiovascular: No clubbing, cyanosis, or edema. Respiratory: Normal respiratory effort, no increased work of breathing. GI: Abdomen is soft, nontender, nondistended, no abdominal masses GU: No CVA tenderness Skin: No rashes, bruises or suspicious lesions. Neurologic: Grossly intact, no focal deficits, moving all 4 extremities. Psychiatric: Normal mood and affect.  Laboratory Data: Lab  Results  Component Value Date   WBC 7.4 10/02/2018   HGB 12.6 10/02/2018   HCT 38.8 10/02/2018   MCV 90.0 10/02/2018   PLT 340 10/02/2018    Lab Results  Component Value Date   CREATININE 0.98 10/02/2018    Urinalysis Dipstick/microscopy negative  Assessment & Plan:    1. h/o Renal Cell Carcinoma s/p left nephrectomy    2. History nephrolithiasis KUB 1 year ago was negative  Will schedule RUS and call with report  Schedule for a yearly follow-up  2. Urinary urgency  Try fluid modification first, then return in no improvement If no improvement in symptoms, will discuss medical management    Abbie Sons, MD  [place] [name], [place], [place] [address] [phone]  I, Lucas Mallow, am acting as a scribe for Dr. Abbie Sons,  I have reviewed the note for accuracy and completeness and agree. John Giovanni, MD

## 2018-10-16 ENCOUNTER — Ambulatory Visit (INDEPENDENT_AMBULATORY_CARE_PROVIDER_SITE_OTHER): Payer: Medicare Other | Admitting: Urology

## 2018-10-16 ENCOUNTER — Encounter: Payer: Self-pay | Admitting: Urology

## 2018-10-16 VITALS — BP 129/54 | HR 76 | Ht 70.0 in | Wt 258.6 lb

## 2018-10-16 DIAGNOSIS — Z85528 Personal history of other malignant neoplasm of kidney: Secondary | ICD-10-CM | POA: Diagnosis not present

## 2018-10-16 DIAGNOSIS — M19049 Primary osteoarthritis, unspecified hand: Secondary | ICD-10-CM | POA: Insufficient documentation

## 2018-10-16 DIAGNOSIS — Z87440 Personal history of urinary (tract) infections: Secondary | ICD-10-CM | POA: Diagnosis not present

## 2018-10-16 DIAGNOSIS — Z87442 Personal history of urinary calculi: Secondary | ICD-10-CM

## 2018-10-16 DIAGNOSIS — M7020 Olecranon bursitis, unspecified elbow: Secondary | ICD-10-CM | POA: Insufficient documentation

## 2018-10-16 DIAGNOSIS — S8000XA Contusion of unspecified knee, initial encounter: Secondary | ICD-10-CM | POA: Insufficient documentation

## 2018-10-16 DIAGNOSIS — M1710 Unilateral primary osteoarthritis, unspecified knee: Secondary | ICD-10-CM | POA: Insufficient documentation

## 2018-10-16 LAB — URINALYSIS, COMPLETE
Bilirubin, UA: NEGATIVE
Glucose, UA: NEGATIVE
Ketones, UA: NEGATIVE
Leukocytes, UA: NEGATIVE
Nitrite, UA: NEGATIVE
Protein, UA: NEGATIVE
RBC, UA: NEGATIVE
Specific Gravity, UA: 1.025 (ref 1.005–1.030)
Urobilinogen, Ur: 0.2 mg/dL (ref 0.2–1.0)
pH, UA: 5.5 (ref 5.0–7.5)

## 2018-10-16 LAB — MICROSCOPIC EXAMINATION
RBC, UA: NONE SEEN /hpf (ref 0–2)
WBC, UA: NONE SEEN /hpf (ref 0–5)

## 2018-10-21 ENCOUNTER — Telehealth: Payer: Self-pay | Admitting: Urology

## 2018-10-21 DIAGNOSIS — N200 Calculus of kidney: Secondary | ICD-10-CM

## 2018-10-21 DIAGNOSIS — Z87442 Personal history of urinary calculi: Secondary | ICD-10-CM

## 2018-10-21 NOTE — Telephone Encounter (Signed)
Patient was seen on 10/16/18 and Dr. Bernardo Heater recommended a RUS and will call with results.  Please place order and have scheduling give her a call at [phone].  She is available to schedule anytime on a Thursday, except 10/30/18.

## 2018-10-22 NOTE — Telephone Encounter (Signed)
Order placed

## 2018-10-30 ENCOUNTER — Encounter: Payer: Self-pay | Admitting: Nurse Practitioner

## 2018-10-30 ENCOUNTER — Ambulatory Visit: Payer: Medicare Other | Admitting: Nurse Practitioner

## 2018-10-30 ENCOUNTER — Ambulatory Visit (INDEPENDENT_AMBULATORY_CARE_PROVIDER_SITE_OTHER): Payer: Medicare Other | Admitting: Nurse Practitioner

## 2018-10-30 VITALS — BP 138/70 | HR 71 | Ht 70.0 in | Wt 260.5 lb

## 2018-10-30 DIAGNOSIS — I10 Essential (primary) hypertension: Secondary | ICD-10-CM | POA: Diagnosis not present

## 2018-10-30 DIAGNOSIS — I739 Peripheral vascular disease, unspecified: Secondary | ICD-10-CM | POA: Diagnosis not present

## 2018-10-30 DIAGNOSIS — E785 Hyperlipidemia, unspecified: Secondary | ICD-10-CM

## 2018-10-30 DIAGNOSIS — I2510 Atherosclerotic heart disease of native coronary artery without angina pectoris: Secondary | ICD-10-CM | POA: Diagnosis not present

## 2018-10-30 MED ORDER — EZETIMIBE 10 MG PO TABS: 10.0000 mg | ORAL_TABLET | Freq: Every day | ORAL | 2 refills | Status: DC

## 2018-10-30 MED ORDER — CARVEDILOL 12.5 MG PO TABS: 18.7500 mg | ORAL_TABLET | Freq: Two times a day (BID) | ORAL | 2 refills | Status: DC

## 2018-10-30 NOTE — Patient Instructions (Signed)
Medication Instructions:  Your physician has recommended you make the following change in your medication:  1- Carvedilol : Take 1.5 tablets (18.75 mg total) by mouth 2 (two) times daily with a meal 2- Zetia: Take 1 tablet (10 mg) one time daily  If you need a refill on your cardiac medications before your next appointment, please call your pharmacy.   Lab work: None today   If you have labs (blood work) drawn today and your tests are completely normal, you will receive your results only by: [facility] Kitchen MyChart Message (if you have MyChart) OR . A paper copy in the mail If you have any lab test that is abnormal or we need to change your treatment, we will call you to review the results.  Testing/Procedures: Your physician has requested that you have an ankle brachial index (ABI). During this test an ultrasound and blood pressure cuff are used to evaluate the arteries that supply the arms and legs with blood. Allow thirty minutes for this exam. There are no restrictions or special instructions.   Follow-Up: At [facility], you and your health needs are our priority.  As part of our continuing mission to provide you with exceptional heart care, we have created designated Provider Care Teams.  These Care Teams include your primary Cardiologist (physician) and Advanced Practice Providers (APPs -  Physician Assistants and Nurse Practitioners) who all work together to provide you with the care you need, when you need it. You will need a follow up appointment in 6 months.  Please call our office 2 months in advance to schedule this appointment.  You may see Ida Rogue, MD or one of the following Advanced Practice Providers on your designated Care Team:   Murray Hodgkins, NP Christell Faith, PA-C . Marrianne Mood, PA-C

## 2018-10-30 NOTE — Progress Notes (Signed)
Office Visit    Patient Name: Carrie Larsen Date of Encounter: 10/30/2018  Primary Care Provider:  [place] Primary Cardiologist:  Ida Rogue, MD  Chief Complaint    72 year old female with a history of hypertension, hyperlipidemia, renal cell cancer status post partial left nephrectomy, colon cancer, GERD, obesity, and CAD status post non-STEMI and OM1 stenting in August 2018, who presents for follow-up after ER visit for chest pain in mid November.  Past Medical History    Past Medical History:  Diagnosis Date  . CAD (coronary artery disease)    a. 2012 ETT: no ischemia;  b. 06/2017 NSTEMI/PCI: LM nl, LAD nl, D1 70-80p, LCX nl, OM1 90 (3.0 x 23 Xience Alpine), RCA dominant, 100p/m, fills via L->R collats, EF 55-65%.  . Cancer [facility])    a. s/p partial left nephrectomy.  . Colon cancer ([place]) 1992   T3, N1, M0.  . Colon polyp 2011  . Diastolic dysfunction    a. 08/2015 Echo: EF 60-65%, no rwma, Gr1 DD, midlly dil LA, PASP 51mmHg. No significant valvular dzs; b. 06/2017 Echo: EF 60-65%, Gr1DD, mildly dil LA.  . Essential hypertension   . GERD (gastroesophageal reflux disease)   . History of kidney stones    passed - 2  . Hyperlipidemia   . Lumbar spinal stenosis   . Myocardial infarction ([place])    06/2017  . Retinal cyst    Past Surgical History:  Procedure Laterality Date  . ABDOMINAL HYSTERECTOMY    . APPENDECTOMY    . CATARACT EXTRACTION W/PHACO Left 04/24/2016   Procedure: CATARACT EXTRACTION PHACO AND INTRAOCULAR LENS PLACEMENT (IOC);  Surgeon: Birder Robson, MD;  Location: [facility] ORS;  Service: Ophthalmology;  Laterality: Left;  [place] 45.8 AP% 16.4 CDE 7.53 FLUID PACK LOT # [id number]  . CATARACT EXTRACTION W/PHACO Right 06/05/2016   Procedure: CATARACT EXTRACTION PHACO AND INTRAOCULAR LENS PLACEMENT (IOC);  Surgeon: Birder Robson, MD;  Location: [facility] ORS;  Service: Ophthalmology;  Laterality: Right;  [place] 25.9 AP% 21.9 CDE 5.68 Fluid pack lot  # [id number]  . COLON RESECTION  03/04/1991  . COLON SURGERY    . COLONOSCOPY  03-14-10   Dr Bary Castilla, tubular adenoma at 25 cm.  . COLONOSCOPY WITH PROPOFOL N/A 06/07/2015   Procedure: COLONOSCOPY WITH PROPOFOL;  Surgeon: Robert Bellow, MD;  Location: [facility] ENDOSCOPY;  Service: Endoscopy;  Laterality: N/A;  . CORONARY STENT INTERVENTION N/A 06/28/2017   Procedure: CORONARY STENT INTERVENTION;  Surgeon: Yolonda Kida, MD;  Location: [place] CV LAB;  Service: Cardiovascular;  Laterality: N/A;  . HERNIA REPAIR  [id number]   umbilical  . LEFT HEART CATH AND CORONARY ANGIOGRAPHY N/A 06/28/2017   Procedure: LEFT HEART CATH AND CORONARY ANGIOGRAPHY;  Surgeon: Minna Merritts, MD;  Location: [place] CV LAB;  Service: Cardiovascular;  Laterality: N/A;  . LUMBAR LAMINECTOMY/DECOMPRESSION MICRODISCECTOMY Bilateral 03/10/2018   Procedure: Laminectomy and Foraminotomy - Lumbar three-Lumbar four - Lumbar four-Lumbar five - bilateral;  Surgeon: Earnie Larsson, MD;  Location: [place];  Service: Neurosurgery;  Laterality: Bilateral;  . NEPHRECTOMY  1992   left- cancer  . RIB RESECTION     removal 4 ribs  . TONSILLECTOMY      Allergies  Allergies  Allergen Reactions  . Morphine And Related Nausea Only  . Latex Rash    History of Present Illness    72 year old female with the above complex past medical history including hypertension, hyperlipidemia, systolic murmur, renal cancer status post  partial left nephrectomy, colon cancer, GERD, spinal stenosis, and coronary artery disease status post non-STEMI in August 2018.  Catheterization at that time showed severe OM1 disease with chronic total occlusion of the RCA.  The OM1 was successfully treated with drug-eluting stent.  She was last seen in clinic here in July, at which time she was doing well.  Follow-up lipids in August showed an LDL of 88 and it was suggested that she might consider resuming Zetia.  She never did this.  She had previously come off  of it due to concerns about possible diarrhea.  More recent lipids drawn by primary care in October showed LDL of 110.  In mid November, she had a 3-day episode of persistent mild chest discomfort for which she eventually presented to the emergency department.  There, she was diagnosed with a urinary tract infection.  She was also hypokalemic.  Troponins were normal and ECG nonacute.  She was treated for UTI and sent home.  She has had no recurrence of chest discomfort.  She now works as a Quarry manager and denies any exertional chest pain or dyspnea.  She notes 15 pound weight gain in the setting of having been in CNA school and also postoperative from back surgery in the spring.  She says she has not been paying as close attention to what she is eating as she needs to be.  She has again indicated that she is going to try Zetia.  Her only new complaint today is that of bilateral lower extremity heaviness, especially in the calves, that occurs with ambulation.  Symptoms lasted few minutes and resolve with rest.  She has not had any pain, changes to hair distribution, or skin breakdown.  Home Medications    Prior to Admission medications   Medication Sig Start Date End Date Taking? Authorizing Provider  aspirin EC 81 MG tablet Take 1 tablet (81 mg total) by mouth daily. 07/08/17  Yes Theora Gianotti, NP  atorvastatin (LIPITOR) 80 MG tablet TAKE 1 TABLET BY MOUTH ONCE DAILY AT [facility] 04/11/18  Yes Gollan, Kathlene November, MD  carvedilol (COREG) 12.5 MG tablet TAKE 1 TABLET BY MOUTH TWICE DAILY WITH A MEAL 04/11/18  Yes Gollan, Kathlene November, MD  clopidogrel (PLAVIX) 75 MG tablet Take 1 tablet (75 mg total) by mouth daily. 07/03/18  Yes Minna Merritts, MD  esomeprazole (NEXIUM) 40 MG capsule Take 40 mg by mouth daily at 3 pm.    Yes [provider]  furosemide (LASIX) 20 MG tablet Take 20 mg by mouth every morning. 05/06/17  Yes [provider]  hydrochlorothiazide (HYDRODIURIL) 25 MG tablet Take 25 mg  by mouth daily at 3 pm.  04/15/16  Yes [provider]  isosorbide mononitrate (IMDUR) 30 MG 24 hr tablet Take 1 tablet (30 mg total) by mouth daily. 07/24/17  Yes Minna Merritts, MD  Multiple Vitamins-Minerals (MULTIVITAMIN WITH MINERALS) tablet Take 1 tablet by mouth daily.   Yes [provider]  nitroGLYCERIN (NITROSTAT) 0.4 MG SL tablet Place 1 tablet (0.4 mg total) under the tongue every 5 (five) minutes x 3 doses as needed for chest pain. 06/29/17  Yes Epifanio Lesches, MD  traMADol (ULTRAM) 50 MG tablet tramadol 50 mg tablet  Take 1 tablet(s) EVERY 6 HOURS by oral route PRN pain   Yes [provider]    Review of Systems    Episode of chest pain in November that lasted 3 days and resolve spontaneously.  Negative work-up in  the ED.  She has been noting bilateral lower extremity heaviness with ambulation and is concerned that she may have developed peripheral arterial disease.  She denies palpitations, PND, dyspnea, orthopnea, dizziness, syncope, or early satiety.  She sometimes notes lower extremity swelling at the end of the day.  All other systems reviewed and are otherwise negative except as noted above.  Physical Exam    VS:  BP 138/70 (BP Location: Left Arm, Patient Position: Sitting, Cuff Size: Large)   Pulse 71   Ht 5\' 10"  (1.778 m)   Wt 260 lb 8 oz (118.2 kg)   BMI 37.38 kg/m  , BMI Body mass index is 37.38 kg/m. GEN: Well nourished, well developed, in no acute distress. HEENT: normal. Neck: Supple, no JVD, carotid bruits, or masses. Cardiac: RRR, 2/6 stock murmur at the upper sternal borders, no rubs, or gallops. No clubbing, cyanosis, trace bilateral ankle edema.  Radials/DP/PT 2+ and equal bilaterally.  Respiratory:  Respirations regular and unlabored, clear to auscultation bilaterally. GI: Soft, nontender, nondistended, BS + x 4. MS: no deformity or atrophy. Skin: warm and dry, no rash. Neuro:  Strength and sensation are intact. Psych:  Normal affect.  Accessory Clinical Findings    ECG personally reviewed by me today -regular sinus rhythm, 71, prior inferior and anterior infarcts- no acute changes.  Assessment & Plan    1.  Atypical chest pain/CAD: Patient is status post drug-eluting stent placement to the OM1 in 2018.  She had a chronic total occlusion of the right coronary artery at that time.  In mid November, she had 3 days worth of mild retrosternal discomfort without associated symptoms.  She was seen in the emergency department with negative troponins and subsequently discharged.  Symptoms resolved within a day or 2 of ER visit and she has had no recurrence of those symptoms.  She has been walking some without chest pain or dyspnea and tolerating medications well.  In the absence of objective evidence of ischemia during ER visit despite prolonged symptoms, I would not pursue additional ischemic evaluation at this time.  She remains on aspirin, statin, beta-blocker, Plavix, nitrate therapy.  We did discuss possible discontinuation of Plavix as she is greater than 12 months out since her PCI however she will continue for now.  2.  Essential hypertension: Blood pressure elevated at 138/70.  She says it typically runs in the 140s at home.  I am going to increase her carvedilol to 18.75 mg twice daily.  3.  Hyperlipidemia: Recent LDL performed by primary care was 110.  Total cholesterol 214, triglycerides 278, HDL 48.  She is on Lipitor 80 mg and says that she has not been adhering to a heart healthy diet recently.  We had previously tried her on Zetia but this was stopped due to concerns about diarrhea.  When she had follow-up lipids in August, she was willing to retry Zetia but says she never did.  She now says that she will retry.  If she is not able to tolerate Zetia, we will need to refer her to lipid clinic for PCSK9 inhibitor initiation.  4.  Lower extremity claudication: Patient has been experiencing bilateral calf  heaviness and discomfort with ambulation.  She is interested in lower extremity arterial Dopplers and we will order.  Continue aspirin and statin therapy.  5.  Disposition: Follow-up ABIs.  Follow-up in clinic in 6 months or sooner if necessary.  Murray Hodgkins, NP 10/30/2018, 4:02 PM

## 2018-11-06 ENCOUNTER — Ambulatory Visit: Payer: Medicare Other

## 2018-11-18 ENCOUNTER — Telehealth: Payer: Self-pay | Admitting: Cardiovascular Disease

## 2018-11-18 NOTE — Telephone Encounter (Signed)
I reviewed the patient's chart and do not see that she has worn a recent heart monitor. I am unsure who called her.  I have left a message for the patient to please call back when she is able to give some details as to which company called her and what # she called them at.

## 2018-11-18 NOTE — Telephone Encounter (Signed)
Patient calling States that she received a phone call the other day stating she needed to turn in her patches and her monitor Patient called the number back to inquire and they told her to contact us Patient is not sure what this is about Please call to discuss - she will unable to answer phone calls so please leave a message

## 2018-11-20 DIAGNOSIS — D3131 Benign neoplasm of right choroid: Secondary | ICD-10-CM | POA: Insufficient documentation

## 2018-11-20 DIAGNOSIS — Z85528 Personal history of other malignant neoplasm of kidney: Secondary | ICD-10-CM | POA: Insufficient documentation

## 2018-11-20 DIAGNOSIS — H35352 Cystoid macular degeneration, left eye: Secondary | ICD-10-CM | POA: Insufficient documentation

## 2018-11-20 DIAGNOSIS — H353131 Nonexudative age-related macular degeneration, bilateral, early dry stage: Secondary | ICD-10-CM

## 2018-11-20 DIAGNOSIS — Z961 Presence of intraocular lens: Secondary | ICD-10-CM | POA: Insufficient documentation

## 2018-11-20 HISTORY — DX: Nonexudative age-related macular degeneration, bilateral, early dry stage: H35.3131

## 2018-11-20 HISTORY — DX: Cystoid macular degeneration, left eye: H35.352

## 2018-11-20 HISTORY — DX: Personal history of other malignant neoplasm of kidney: Z85.528

## 2018-11-21 NOTE — Telephone Encounter (Signed)
Call to patient, left VM. Call from monitor company was erroneous and we have removed her name from the website.  Encouraged pt to call back with any further questions.

## 2018-12-04 ENCOUNTER — Telehealth: Payer: Self-pay | Admitting: Cardiovascular Disease

## 2018-12-04 ENCOUNTER — Ambulatory Visit: Payer: Medicare Other

## 2018-12-04 DIAGNOSIS — I2510 Atherosclerotic heart disease of native coronary artery without angina pectoris: Secondary | ICD-10-CM

## 2018-12-04 NOTE — Telephone Encounter (Signed)
Patient feels the Lipitor has been doing this to her for a while now. Arms and legs have felt so stiff and sore over the past several months.  Says it has been so hard for her to even walk into [employer] because the of the pain and discomfort. She wants to keep working and stay active but its been hard to with the pain and discomfort. She stopped the Lipitor about 3 days ago and was able to walk into [employer] with no problem yesterday.  She already feels much better. Starting a new diet in order to lose weight.  Patient was to retry Zetia after last office visit but never did. Patient saw Ignacia Bayley, NP in December and there was mention of Lipid clinic. Routing to [place], NP for advice.

## 2018-12-04 NOTE — Telephone Encounter (Signed)
Pt c/o medication issue:  1. Name of Medication: Liptior  2. How are you currently taking this medication (dosage and times per day)? 80 mg dailey  3. Are you having a reaction (difficulty breathing--STAT)? No reacton, just not able to walk, muscles feel sore.  Arms and Legs ache. Pt states she has stopped this medication for 3 days and she has been able to walk a lot easier  4. What is your medication issue?  Muscle aches Pt states it is ok to leave a detailed message.

## 2018-12-05 NOTE — Telephone Encounter (Signed)
Reasonable to remain off of lipitor for 1-2 wks prior to any other changes.  I'd like for her to try crestor 20mg  daily after about 1-2 wks.  Not tolerating lipitor does not mean that she will not tolerate all statins, so it's worth giving a different statin a try.  In order to avoid confusion in case a side effect does crop up, she should hold off on starting zetia for now.  If she's willing to try crestor, she should have f/u lipids/lft's about 6 wks after starting.

## 2018-12-05 NOTE — Telephone Encounter (Signed)
Left a message for the patient to call back.  

## 2018-12-05 NOTE — Telephone Encounter (Signed)
Patient returning call, when calling back - if unavailable ok to leave detailed message Patient was also unable to r/s arterial at this time

## 2018-12-08 MED ORDER — ROSUVASTATIN CALCIUM 20 MG PO TABS: 20.0000 mg | ORAL_TABLET | Freq: Every day | ORAL | 0 refills | Status: DC

## 2018-12-08 NOTE — Telephone Encounter (Signed)
Call to patient to discuss POC for medication management given that she has not tolerated Lipitor. See written advice from Ignacia Bayley, NP. She agrees to POC. She has been off Lipitor for 1 week now. She will hold for another week before starting Crestor. Pt requested I only send in 15 days of medication, in case she is unable to tolerate.   Medication sent to pt preferred pharm. Lab order placed for 7 weeks in the event that she dose tolerate Crestor.  Advised pt to call for any further questions or concerns

## 2018-12-25 ENCOUNTER — Telehealth: Payer: Self-pay | Admitting: Urology

## 2018-12-25 ENCOUNTER — Ambulatory Visit
Admission: RE | Admit: 2018-12-25 | Discharge: 2018-12-25 | Disposition: A | Discharge status: Home / Self Care | Payer: Medicare Other | Source: Ambulatory Visit | Attending: Urology | Admitting: Urology

## 2018-12-25 DIAGNOSIS — N200 Calculus of kidney: Secondary | ICD-10-CM | POA: Insufficient documentation

## 2018-12-25 DIAGNOSIS — Z87442 Personal history of urinary calculi: Secondary | ICD-10-CM | POA: Insufficient documentation

## 2018-12-25 NOTE — Telephone Encounter (Signed)
Pt called office to ask Dr. Bernardo Heater to please leave a detailed message on her voicemail if she is unable to answer his call with her imaging results.  Pt states she works in the [employer] and it's hard to answer/call back right away. Reassures it's okay to leave detailed message with her results on her voicemail.  FYI

## 2018-12-29 NOTE — Telephone Encounter (Signed)
Patient notified and voiced understanding.

## 2018-12-29 NOTE — Telephone Encounter (Signed)
Renal ultrasound showed a normal-appearing right kidney.  There was a simple renal cyst present.  No renal calculi were noted.  No evidence of obstruction was noted.

## 2019-01-08 ENCOUNTER — Ambulatory Visit (INDEPENDENT_AMBULATORY_CARE_PROVIDER_SITE_OTHER): Payer: Medicare Other

## 2019-01-08 DIAGNOSIS — I739 Peripheral vascular disease, unspecified: Secondary | ICD-10-CM | POA: Diagnosis not present

## 2019-01-09 ENCOUNTER — Telehealth: Payer: Self-pay

## 2019-01-09 NOTE — Telephone Encounter (Signed)
Left voicemail message for patient to call back.

## 2019-01-09 NOTE — Telephone Encounter (Signed)
-----   Message from Theora Gianotti, NP sent at 01/08/2019  9:09 PM EST ----- Evidence of moderate blockage in the lower portions of the left leg and upper to mid portions of the right leg.  Please arrange for f/u with Dr. Fletcher Anon for new PV eval.

## 2019-01-09 NOTE — Telephone Encounter (Signed)
10:00 AM, left message for pt to call back for results.

## 2019-01-09 NOTE — Telephone Encounter (Signed)
Spoke to patient regarding results from lower extremity u/s. She verbalized understanding. Appt was made for next week with patient.   Advised pt to call for any further questions or concerns.

## 2019-01-12 ENCOUNTER — Other Ambulatory Visit: Payer: Self-pay

## 2019-01-12 MED ORDER — NITROGLYCERIN 0.4 MG SL SUBL: 0.4000 mg | SUBLINGUAL_TABLET | SUBLINGUAL | 1 refills | Status: DC | PRN

## 2019-01-12 NOTE — Telephone Encounter (Signed)
Requested Prescriptions   Signed Prescriptions Disp Refills  . nitroGLYCERIN (NITROSTAT) 0.4 MG SL tablet 20 tablet 1    Sig: Place 1 tablet (0.4 mg total) under the tongue every 5 (five) minutes x 3 doses as needed for chest pain.    Authorizing Provider: Minna Merritts    Ordering User: Janan Ridge

## 2019-01-15 ENCOUNTER — Ambulatory Visit: Payer: Medicare Other | Admitting: Cardiovascular Disease

## 2019-01-29 ENCOUNTER — Encounter: Payer: Self-pay | Admitting: Cardiovascular Disease

## 2019-01-29 ENCOUNTER — Ambulatory Visit (INDEPENDENT_AMBULATORY_CARE_PROVIDER_SITE_OTHER): Payer: Medicare Other | Admitting: Cardiovascular Disease

## 2019-01-29 VITALS — BP 130/68 | HR 77 | Ht 70.0 in | Wt 263.0 lb

## 2019-01-29 DIAGNOSIS — I739 Peripheral vascular disease, unspecified: Secondary | ICD-10-CM | POA: Diagnosis not present

## 2019-01-29 DIAGNOSIS — I2510 Atherosclerotic heart disease of native coronary artery without angina pectoris: Secondary | ICD-10-CM

## 2019-01-29 DIAGNOSIS — E785 Hyperlipidemia, unspecified: Secondary | ICD-10-CM

## 2019-01-29 NOTE — Patient Instructions (Signed)
Medication Instructions:  No changes If you need a refill on your cardiac medications before your next appointment, please call your pharmacy.   Lab work: None ordered  Testing/Procedures: None ordered  Follow-Up: As needed with Dr. Fletcher Anon

## 2019-01-29 NOTE — Progress Notes (Signed)
Cardiology Office Note   Date:  01/29/2019   ID:  Carrie, Larsen 1946/07/07, MRN [id number]  PCP:  [place]  Cardiologist:  Dr. Rockey Situ  Chief Complaint  Patient presents with  . Other    pv eval per Dr. Rockey Situ fu [facility]. Patient c/o Swelling in left leg and "streaking pain" up that leg. Meds reviewed verbally with patient.       History of Present Illness: Carrie Larsen is a 73 y.o. female who was referred by Dr. Rockey Situ for evaluation management of peripheral arterial disease.  She has known history of coronary artery disease with previous non-STEMI and OM stenting in August 2018, hypertension, hyperlipidemia, renal cell cancer status post partial left nephrectomy, colon cancer, GERD and obesity. She complained of bilateral leg pain with exertion.  She underwent noninvasive vascular studies recently which showed normal ABI bilaterally.  Toe brachial index was slightly decreased on the left side.  Duplex showed moderate right popliteal artery stenosis and moderate left TP trunk disease. She reports sharp pain affecting the lower part of both legs that sometimes wakes her up at night.  She also has numbness in both feet and heaviness feeling.  Past Medical History:  Diagnosis Date  . CAD (coronary artery disease)    a. 2012 ETT: no ischemia;  b. 06/2017 NSTEMI/PCI: LM nl, LAD nl, D1 70-80p, LCX nl, OM1 90 (3.0 x 23 Xience Alpine), RCA dominant, 100p/m, fills via L->R collats, EF 55-65%.  . Cancer [facility]    a. s/p partial left nephrectomy.  . Colon cancer ([place]) 1992   T3, N1, M0.  . Colon polyp 2011  . Diastolic dysfunction    a. 08/2015 Echo: EF 60-65%, no rwma, Gr1 DD, midlly dil LA, PASP 20mmHg. No significant valvular dzs; b. 06/2017 Echo: EF 60-65%, Gr1DD, mildly dil LA.  . Essential hypertension   . GERD (gastroesophageal reflux disease)   . History of kidney stones    passed - 2  . Hyperlipidemia   . Lumbar spinal stenosis   . Myocardial infarction  ([place])    06/2017  . Retinal cyst     Past Surgical History:  Procedure Laterality Date  . ABDOMINAL HYSTERECTOMY    . APPENDECTOMY    . CATARACT EXTRACTION W/PHACO Left 04/24/2016   Procedure: CATARACT EXTRACTION PHACO AND INTRAOCULAR LENS PLACEMENT (IOC);  Surgeon: Birder Robson, MD;  Location: [facility] ORS;  Service: Ophthalmology;  Laterality: Left;  [place] 45.8 AP% 16.4 CDE 7.53 FLUID PACK LOT # [id number]  . CATARACT EXTRACTION W/PHACO Right 06/05/2016   Procedure: CATARACT EXTRACTION PHACO AND INTRAOCULAR LENS PLACEMENT (IOC);  Surgeon: Birder Robson, MD;  Location: [facility] ORS;  Service: Ophthalmology;  Laterality: Right;  [place] 25.9 AP% 21.9 CDE 5.68 Fluid pack lot # [id number]  . COLON RESECTION  03/04/1991  . COLON SURGERY    . COLONOSCOPY  03-14-10   Dr Bary Castilla, tubular adenoma at 25 cm.  . COLONOSCOPY WITH PROPOFOL N/A 06/07/2015   Procedure: COLONOSCOPY WITH PROPOFOL;  Surgeon: Robert Bellow, MD;  Location: [facility] ENDOSCOPY;  Service: Endoscopy;  Laterality: N/A;  . CORONARY STENT INTERVENTION N/A 06/28/2017   Procedure: CORONARY STENT INTERVENTION;  Surgeon: Yolonda Kida, MD;  Location: [place] CV LAB;  Service: Cardiovascular;  Laterality: N/A;  . HERNIA REPAIR  [id number]   umbilical  . LEFT HEART CATH AND CORONARY ANGIOGRAPHY N/A 06/28/2017   Procedure: LEFT HEART CATH AND CORONARY ANGIOGRAPHY;  Surgeon: Minna Merritts, MD;  Location: [place] CV LAB;  Service: Cardiovascular;  Laterality: N/A;  . LUMBAR LAMINECTOMY/DECOMPRESSION MICRODISCECTOMY Bilateral 03/10/2018   Procedure: Laminectomy and Foraminotomy - Lumbar three-Lumbar four - Lumbar four-Lumbar five - bilateral;  Surgeon: Earnie Larsson, MD;  Location: [place];  Service: Neurosurgery;  Laterality: Bilateral;  . NEPHRECTOMY  1992   left- cancer  . RIB RESECTION     removal 4 ribs  . TONSILLECTOMY       Current Outpatient Medications  Medication Sig Dispense Refill  . aspirin EC 81 MG tablet Take 1 tablet (81  mg total) by mouth daily. 90 tablet 3  . carvedilol (COREG) 12.5 MG tablet Take 1.5 tablets (18.75 mg total) by mouth 2 (two) times daily with a meal. 180 tablet 2  . clopidogrel (PLAVIX) 75 MG tablet Take 1 tablet (75 mg total) by mouth daily. 90 tablet 1  . esomeprazole (NEXIUM) 40 MG capsule Take 40 mg by mouth daily at 3 pm.     . furosemide (LASIX) 20 MG tablet Take 20 mg by mouth every morning.  0  . hydrochlorothiazide (HYDRODIURIL) 25 MG tablet Take 25 mg by mouth daily at 3 pm.     . isosorbide mononitrate (IMDUR) 30 MG 24 hr tablet Take 1 tablet (30 mg total) by mouth daily. 90 tablet 0  . Multiple Vitamins-Minerals (MULTIVITAMIN WITH MINERALS) tablet Take 1 tablet by mouth daily.    . nitroGLYCERIN (NITROSTAT) 0.4 MG SL tablet Place 1 tablet (0.4 mg total) under the tongue every 5 (five) minutes x 3 doses as needed for chest pain. 20 tablet 1   No current facility-administered medications for this visit.     Allergies:   Morphine and related and Latex    Social History:  The patient  reports that she has never smoked. She has never used smokeless tobacco. She reports that she does not drink alcohol or use drugs.   Family History:  The patient's family history includes Cerebral aneurysm in her father; Colon cancer in her mother; Heart attack in her father; Heart attack (age of onset: 51) in her brother.    ROS:  Please see the history of present illness.   Otherwise, review of systems are positive for none.   All other systems are reviewed and negative.    PHYSICAL EXAM: VS:  BP 130/68 (BP Location: Left Arm, Patient Position: Sitting, Cuff Size: Normal)   Pulse 77   Ht 5\' 10"  (1.778 m)   Wt 263 lb (119.3 kg)   BMI 37.74 kg/m  , BMI Body mass index is 37.74 kg/m. GEN: Well nourished, well developed, in no acute distress  HEENT: normal  Neck: no JVD, carotid bruits, or masses Cardiac: RRR; no murmurs, rubs, or gallops,no edema  Respiratory:  clear to auscultation  bilaterally, normal work of breathing GI: soft, nontender, nondistended, + BS MS: no deformity or atrophy  Skin: warm and dry, no rash Neuro:  Strength and sensation are intact Psych: euthymic mood, full affect Vascular: Distal pulses are palpable bilaterally.  EKG:  EKG is not ordered today.   Recent Labs: 10/02/2018: ALT 26; BUN 25; Creatinine, Ser 0.98; Hemoglobin 12.6; Platelets 340; Potassium 3.1; Sodium 140    Lipid Panel    Component Value Date/Time   CHOL 169 06/26/2018 0823   TRIG 151 (H) 06/26/2018 0823   HDL 51 06/26/2018 0823   CHOLHDL 3.3 06/26/2018 0823   CHOLHDL 4.0 09/04/2017 1354   VLDL 56 (H) 09/04/2017 1354   LDLCALC 88  06/26/2018 0823      Wt Readings from Last 3 Encounters:  01/29/19 263 lb (119.3 kg)  10/30/18 260 lb 8 oz (118.2 kg)  10/16/18 258 lb 9.6 oz (117.3 kg)       No flowsheet data found.    ASSESSMENT AND PLAN:  1.  Peripheral arterial disease: The patient has evidence of nonobstructive peripheral arterial disease with normal ABI and near normal toe pressure.  Her current leg symptoms are not due to peripheral arterial disease and they seem to be due to neuropathy more than anything else.  I suggested discussing with her primary care physician about a trial of gabapentin. I recommend continuing aggressive medical therapy.  2.  Coronary artery disease involving native coronary arteries without angina: She had drug-eluting stent placement in August 2018.  If desired, Plavix can be discontinued at this time.  3.  Hyperlipidemia: Currently on high-dose atorvastatin.    Disposition:   FU with me as needed  Signed,  Kathlyn Sacramento, MD  01/29/2019 11:08 AM    [place]

## 2019-02-01 IMAGING — CR DG LUMBAR SPINE 1V
1 series · 1 of 1 positions shown · non-contrast
Comparison: Lumbar MRI 06/11/2017

CLINICAL DATA: Lumbar laminectomy and foraminotomy L3-4 L4-5

EXAM:
LUMBAR SPINE - 1 VIEW

[xtable lateral]
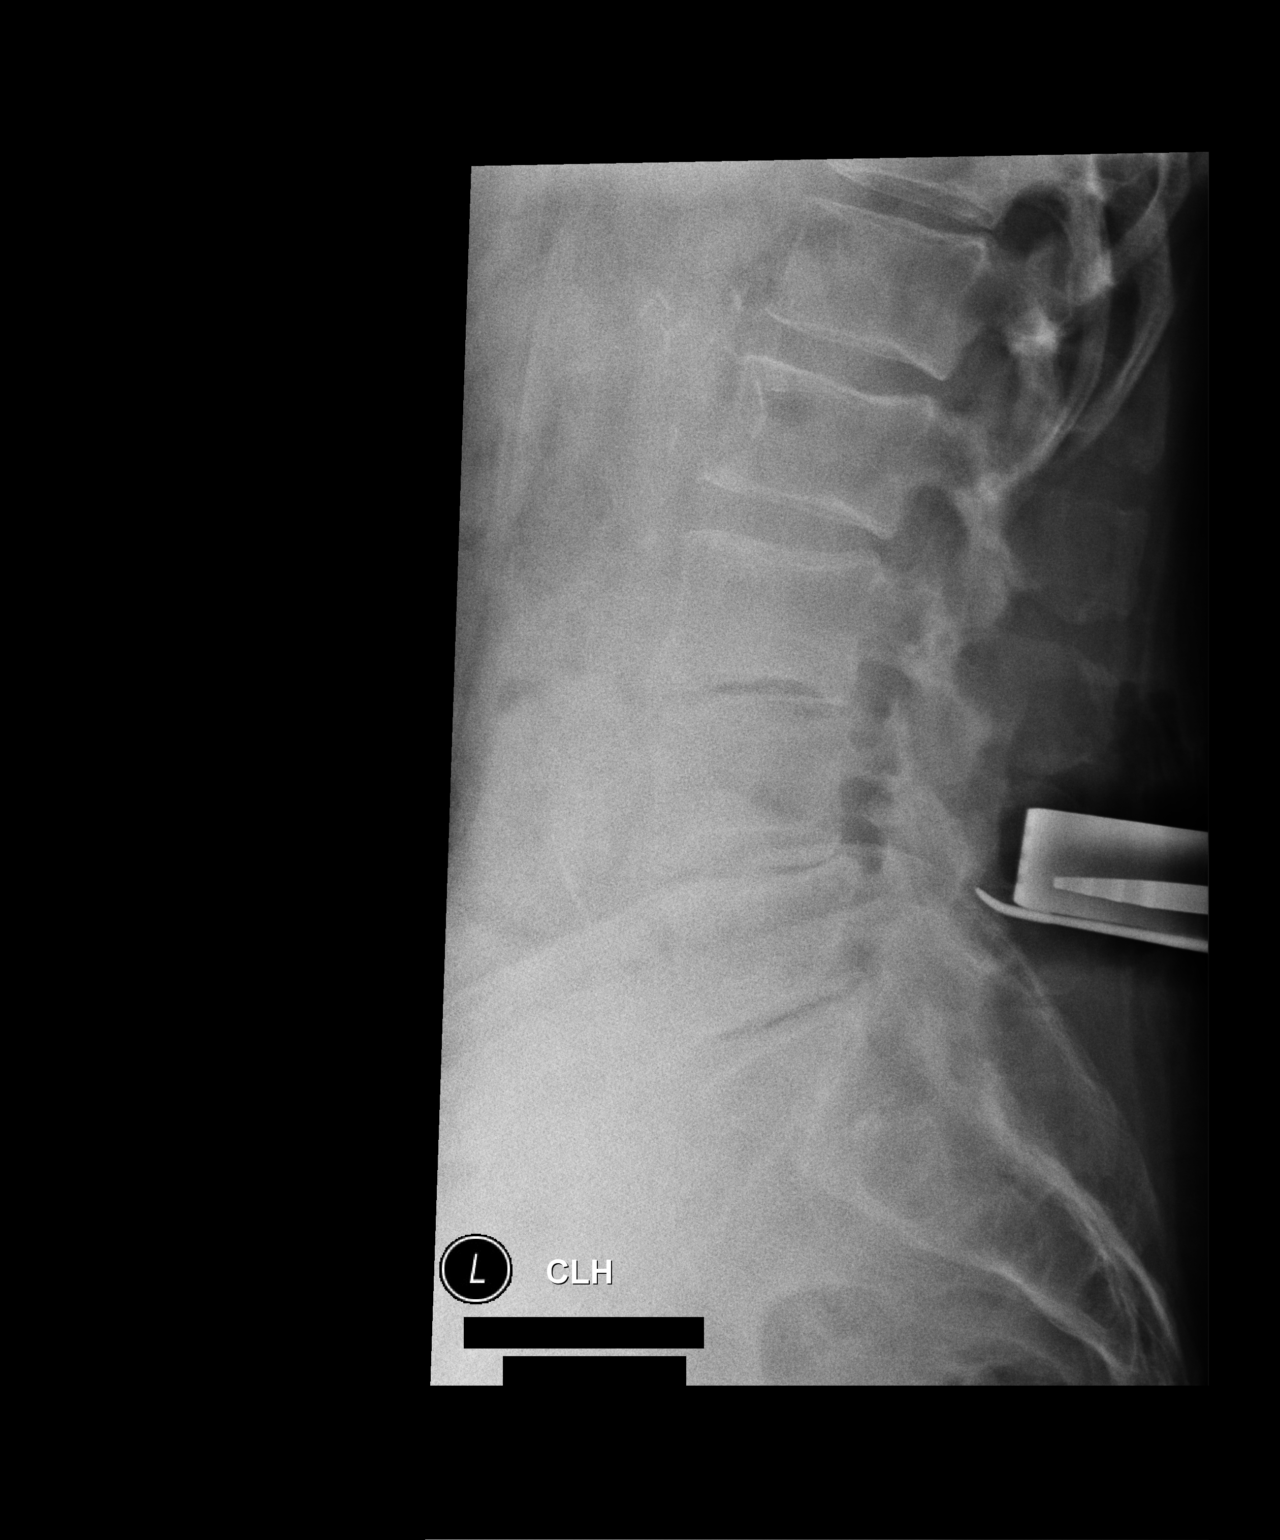

[1 of 1 positions shown; findings below may reference images not displayed]

FINDINGS: Single lateral images in the operating room. Tissue spreaders
instruments posterior to the L4-5 disc space.

Disc degeneration with disc space narrowing and spurring L3-4, L4-5,
L5-S1
IMPRESSION: L4-5 disc space localized in the operating room.

## 2019-02-03 ENCOUNTER — Ambulatory Visit: Payer: Medicare Other | Admitting: Nurse Practitioner

## 2019-03-02 ENCOUNTER — Telehealth: Payer: Self-pay | Admitting: Cardiovascular Disease

## 2019-03-02 NOTE — Telephone Encounter (Signed)
Pt c/o BP issue: STAT if pt c/o blurred vision, one-sided weakness or slurred speech  1. What are your last 5 BP readings?  Today 175/85-, 15 min ago 165/81  2. Are you having any other symptoms (ex. Dizziness, headache, blurred vision, passed out)? Eyes "feel funny"  3. What is your BP issue? elevated

## 2019-03-02 NOTE — Telephone Encounter (Addendum)
Triage contacted for guidance on elevated BP and "eyes feel funny." I attempted to call the patient, but went to voicemail. I left a voicemail and asked her to call back if her BP is not better. I asked her to go to the ER if she is having headache, changes in vision, slurred speech, or weakness.  Please try to touch base with the patient again in the next hour.

## 2019-03-03 NOTE — Telephone Encounter (Signed)
I attempted to call the patient again this morning to check on her BP and symptoms. Unable to reach, left a VM. I asked her to call our office if she continued to have elevated BP or symptoms of hypertensive emergency.

## 2019-03-15 ENCOUNTER — Emergency Department: Payer: Medicare Other

## 2019-03-15 ENCOUNTER — Emergency Department
Admission: EM | Admit: 2019-03-15 | Discharge: 2019-03-15 | Disposition: A | Discharge status: Home / Self Care | Payer: Medicare Other | Attending: Emergency Medicine | Admitting: Emergency Medicine

## 2019-03-15 DIAGNOSIS — I5030 Unspecified diastolic (congestive) heart failure: Secondary | ICD-10-CM | POA: Diagnosis not present

## 2019-03-15 DIAGNOSIS — Z79899 Other long term (current) drug therapy: Secondary | ICD-10-CM | POA: Insufficient documentation

## 2019-03-15 DIAGNOSIS — I2510 Atherosclerotic heart disease of native coronary artery without angina pectoris: Secondary | ICD-10-CM | POA: Insufficient documentation

## 2019-03-15 DIAGNOSIS — Z7982 Long term (current) use of aspirin: Secondary | ICD-10-CM | POA: Diagnosis not present

## 2019-03-15 DIAGNOSIS — K5900 Constipation, unspecified: Secondary | ICD-10-CM | POA: Diagnosis present

## 2019-03-15 DIAGNOSIS — I110 Hypertensive heart disease with heart failure: Secondary | ICD-10-CM | POA: Insufficient documentation

## 2019-03-15 DIAGNOSIS — K219 Gastro-esophageal reflux disease without esophagitis: Secondary | ICD-10-CM | POA: Insufficient documentation

## 2019-03-15 LAB — CBC WITH DIFFERENTIAL/PLATELET
Abs Immature Granulocytes: 0.14 10*3/uL — ABNORMAL HIGH (ref 0.00–0.07)
Basophils Absolute: 0 10*3/uL (ref 0.0–0.1)
Basophils Relative: 0 %
Eosinophils Absolute: 0.1 10*3/uL (ref 0.0–0.5)
Eosinophils Relative: 1 %
HCT: 41.5 % (ref 36.0–46.0)
Hemoglobin: 13.4 g/dL (ref 12.0–15.0)
Immature Granulocytes: 1 %
Lymphocytes Relative: 12 %
Lymphs Abs: 1.4 10*3/uL (ref 0.7–4.0)
MCH: 28 pg (ref 26.0–34.0)
MCHC: 32.3 g/dL (ref 30.0–36.0)
MCV: 86.6 fL (ref 80.0–100.0)
Monocytes Absolute: 0.8 10*3/uL (ref 0.1–1.0)
Monocytes Relative: 7 %
Neutro Abs: 9.5 10*3/uL — ABNORMAL HIGH (ref 1.7–7.7)
Neutrophils Relative %: 79 %
Platelets: 307 10*3/uL (ref 150–400)
RBC: 4.79 MIL/uL (ref 3.87–5.11)
RDW: 13.9 % (ref 11.5–15.5)
WBC: 11.8 10*3/uL — ABNORMAL HIGH (ref 4.0–10.5)
nRBC: 0 % (ref 0.0–0.2)

## 2019-03-15 LAB — COMPREHENSIVE METABOLIC PANEL
ALT: 27 U/L (ref 0–44)
AST: 29 U/L (ref 15–41)
Albumin: 4.3 g/dL (ref 3.5–5.0)
Alkaline Phosphatase: 143 U/L — ABNORMAL HIGH (ref 38–126)
Anion gap: 12 (ref 5–15)
BUN: 19 mg/dL (ref 8–23)
CO2: 30 mmol/L (ref 22–32)
Calcium: 9.5 mg/dL (ref 8.9–10.3)
Chloride: 99 mmol/L (ref 98–111)
Creatinine, Ser: 1 mg/dL (ref 0.44–1.00)
GFR calc Af Amer: >60 mL/min (ref >60)
GFR calc non Af Amer: 56 mL/min — ABNORMAL LOW (ref >60)
Glucose, Bld: 150 mg/dL — ABNORMAL HIGH (ref 70–99)
Potassium: 3.2 mmol/L — ABNORMAL LOW (ref 3.5–5.1)
Sodium: 141 mmol/L (ref 135–145)
Total Bilirubin: 0.8 mg/dL (ref 0.3–1.2)
Total Protein: 7.2 g/dL (ref 6.5–8.1)

## 2019-03-15 LAB — LIPASE, BLOOD: Lipase: 38 U/L (ref 11–51)

## 2019-03-15 MED ORDER — SODIUM CHLORIDE 0.9 % IV BOLUS
1000.0000 mL | Freq: Once | INTRAVENOUS | Status: AC
  Administered 2019-03-15: 1000 mL via INTRAVENOUS

## 2019-03-15 NOTE — Discharge Instructions (Addendum)
Constipation: Take colace twice a day everyday. Take senna once a day at bedtime. Take daily probiotics. Drink plenty of fluids and eat a diet rich in fiber. If you go more than 3 days without a bowel movement, take 1 cap full of Miralax in the morning and one in the evening up to 5 days.

## 2019-03-15 NOTE — ED Notes (Signed)
Patient had another loud liquid BM. Patient reports taking 1 bottle mag citrate before coming to this ED today.

## 2019-03-15 NOTE — ED Notes (Signed)
Patient had moderately sized, fully formed BM. MD informed.

## 2019-03-15 NOTE — ED Notes (Signed)
Reviewed discharge instructions, follow-up care, and OTC medications with patient. Patient verbalized understanding of all information reviewed. Patient stable, with no distress noted at this time.

## 2019-03-15 NOTE — ED Provider Notes (Signed)
[facility] Emergency Department Provider Note  ____________________________________________  Time seen: Approximately 8:29 PM  I have reviewed the triage vital signs and the nursing notes.   HISTORY  Chief Complaint Constipation; Urinary Retention; and Abdominal Pain   HPI Carrie Larsen is a 73 y.o. female with history of partial colectomy and nephrectomy in 1992 from colon cancer, diastolic CHF, GERD, hypertension, CAD, constipation who presents for evaluation of constipation and decreased urine output.  Patient reports that she has not had a bowel movement in 4 days.  Today had a small hard one at 5:00.  She is complaining of a lot of pressure in her rectum.  She denies abdominal pain, nausea, vomiting, fever, chills.  She also reports that she has not urinated since last night.  She denies dysuria or frequency.  Patient reports that she is supposed to be on MiraLAX however she forgets to take it.  She has not taken anything at home for her constipation.  Past Medical History:  Diagnosis Date  . CAD (coronary artery disease)    a. 2012 ETT: no ischemia;  b. 06/2017 NSTEMI/PCI: LM nl, LAD nl, D1 70-80p, LCX nl, OM1 90 (3.0 x 23 Xience [place]), RCA dominant, 100p/m, fills via L->R collats, EF 55-65%.  . Cancer [facility])    a. s/p partial left nephrectomy.  . Colon cancer ([place]) 1992   T3, N1, M0.  . Colon polyp 2011  . Diastolic dysfunction    a. 08/2015 Echo: EF 60-65%, no rwma, Gr1 DD, midlly dil LA, PASP 70mmHg. No significant valvular dzs; b. 06/2017 Echo: EF 60-65%, Gr1DD, mildly dil LA.  . Essential hypertension   . GERD (gastroesophageal reflux disease)   . History of kidney stones    passed - 2  . Hyperlipidemia   . Lumbar spinal stenosis   . Myocardial infarction ([place])    06/2017  . Retinal cyst     Patient Active Problem List   Diagnosis Date Noted  . Olecranon bursitis 10/16/2018  . Contusion of knee 10/16/2018  . Arthritis of knee 10/16/2018   . Arthritis of hand 10/16/2018  . 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 Morbid obesity ([place]) 09/11/2017  . Non-STEMI (non-ST elevated myocardial infarction) ([place]) 06/27/2017  . Abdominal pain 02/28/2017  . Renal colic 31/51/7616  . Nephrolithiasis 02/20/2017  . History of renal cell carcinoma 02/20/2017  . Knee pain 08/14/2016  . Strain of hamstring muscle 08/08/2016  . Left leg swelling 08/22/2015  . Systolic murmur   . Retrosternal chest pain 07/08/2015  . History of colonic polyps 04/16/2015  . Chest pain 03/08/2011  . Hyperlipidemia 03/08/2011  . HTN (hypertension) 03/08/2011    Past Surgical History:  Procedure Laterality Date  . ABDOMINAL HYSTERECTOMY    . APPENDECTOMY    . CATARACT EXTRACTION W/PHACO Left 04/24/2016   Procedure: CATARACT EXTRACTION PHACO AND INTRAOCULAR LENS PLACEMENT (IOC);  Surgeon: Birder Robson, MD;  Location: [facility] ORS;  Service: Ophthalmology;  Laterality: Left;  [place] 45.8 AP% 16.4 CDE 7.53 FLUID PACK LOT # [id number]  . CATARACT EXTRACTION W/PHACO Right 06/05/2016   Procedure: CATARACT EXTRACTION PHACO AND INTRAOCULAR LENS PLACEMENT (IOC);  Surgeon: Birder Robson, MD;  Location: [facility] ORS;  Service: Ophthalmology;  Laterality: Right;  [place] 25.9 AP% 21.9 CDE 5.68 Fluid pack lot # [id number]  . COLON RESECTION  03/04/1991  . COLON SURGERY    . COLONOSCOPY  03-14-10   Dr Bary Castilla, tubular adenoma at 25 cm.  . COLONOSCOPY  WITH PROPOFOL N/A 06/07/2015   Procedure: COLONOSCOPY WITH PROPOFOL;  Surgeon: Robert Bellow, MD;  [facility] ENDOSCOPY;  Service: Endoscopy;  Laterality: N/A;  . CORONARY STENT INTERVENTION N/A 06/28/2017   Procedure: CORONARY STENT INTERVENTION;  Surgeon: Yolonda Kida, MD;  Location: [place] CV LAB;  Service: Cardiovascular;  Laterality: N/A;  . HERNIA REPAIR  7619   umbilical  . LEFT HEART CATH AND CORONARY ANGIOGRAPHY N/A 06/28/2017   Procedure: LEFT HEART CATH AND CORONARY ANGIOGRAPHY;   Surgeon: Minna Merritts, MD;  Location: [place] CV LAB;  Service: Cardiovascular;  Laterality: N/A;  . LUMBAR LAMINECTOMY/DECOMPRESSION MICRODISCECTOMY Bilateral 03/10/2018   Procedure: Laminectomy and Foraminotomy - Lumbar three-Lumbar four - Lumbar four-Lumbar five - bilateral;  Surgeon: Earnie Larsson, MD;  Location: [place];  Service: Neurosurgery;  Laterality: Bilateral;  . NEPHRECTOMY  1992   left- cancer  . RIB RESECTION     removal 4 ribs  . TONSILLECTOMY      Prior to Admission medications   Medication Sig Start Date End Date Taking? Authorizing Provider  aspirin EC 81 MG tablet Take 1 tablet (81 mg total) by mouth daily. 07/08/17   Theora Gianotti, NP  atorvastatin (LIPITOR) 80 MG tablet Take 80 mg by mouth daily.    [provider]  carvedilol (COREG) 12.5 MG tablet Take 1.5 tablets (18.75 mg total) by mouth 2 (two) times daily with a meal. 10/30/18   Theora Gianotti, NP  clopidogrel (PLAVIX) 75 MG tablet Take 1 tablet (75 mg total) by mouth daily. 07/03/18   Minna Merritts, MD  esomeprazole (NEXIUM) 40 MG capsule Take 40 mg by mouth daily at 3 pm.     [provider]  furosemide (LASIX) 20 MG tablet Take 20 mg by mouth every morning. 05/06/17   [provider]  hydrochlorothiazide (HYDRODIURIL) 25 MG tablet Take 25 mg by mouth daily at 3 pm.  04/15/16   [provider]  isosorbide mononitrate (IMDUR) 30 MG 24 hr tablet Take 1 tablet (30 mg total) by mouth daily. 07/24/17   Minna Merritts, MD  Multiple Vitamins-Minerals (MULTIVITAMIN WITH MINERALS) tablet Take 1 tablet by mouth daily.    [provider]  nitroGLYCERIN (NITROSTAT) 0.4 MG SL tablet Place 1 tablet (0.4 mg total) under the tongue every 5 (five) minutes x 3 doses as needed for chest pain. 01/12/19   Minna Merritts, MD    Allergies Morphine and related and Latex  Family History  Problem Relation Age of Onset  . Colon cancer Mother   . Cerebral  aneurysm Father   . Heart attack Father   . Heart attack Brother 62    Social History Social History   Tobacco Use  . Smoking status: Never Smoker  . Smokeless tobacco: Never Used  Substance Use Topics  . Alcohol use: No  . Drug use: No    Review of Systems  Constitutional: Negative for fever. Eyes: Negative for visual changes. ENT: Negative for sore throat. Neck: No neck pain  Cardiovascular: Negative for chest pain. Respiratory: Negative for shortness of breath. Gastrointestinal: Negative for abdominal pain, vomiting. + constipation. Genitourinary: Negative for dysuria. + decreased UOP Musculoskeletal: Negative for back pain. Skin: Negative for rash. Neurological: Negative for headaches, weakness or numbness. Psych: No SI or HI  ____________________________________________   PHYSICAL EXAM:  VITAL SIGNS: ED Triage Vitals  Enc Vitals Group     BP 03/15/19 1947 (!) 150/67     Pulse  Rate 03/15/19 1947 80     Resp 03/15/19 1947 17     Temp 03/15/19 1947 98.7 F (37.1 C)     Temp src --      SpO2 03/15/19 1947 93 %     Weight 03/15/19 1948 262 lb 5.6 oz (119 kg)     Height 03/15/19 1950 5\' 10"  (1.778 m)     Head Circumference --      Peak Flow --      Pain Score 03/15/19 1950 10     Pain Loc --      Pain Edu? --      Excl. in [place]? --     Constitutional: Alert and oriented. Well appearing and in no apparent distress. HEENT:      Head: Normocephalic and atraumatic.         Eyes: Conjunctivae are normal. Sclera is non-icteric.       Mouth/Throat: Mucous membranes are moist.       Neck: Supple with no signs of meningismus. Cardiovascular: Regular rate and rhythm. No murmurs, gallops, or rubs. 2+ symmetrical distal pulses are present in all extremities. No JVD. Respiratory: Normal respiratory effort. Lungs are clear to auscultation bilaterally. No wheezes, crackles, or rhonchi.  Gastrointestinal: Obese, soft, non tender, and non distended with positive bowel  sounds. No rebound or guarding. Genitourinary: No CVA tenderness. Rectal exam showing soft stool in the rectal vault, no impaction Musculoskeletal: Nontender with normal range of motion in all extremities. No edema, cyanosis, or erythema of extremities. Neurologic: Normal speech and language. Face is symmetric. Moving all extremities. No gross focal neurologic deficits are appreciated. Skin: Skin is warm, dry and intact. No rash noted. Psychiatric: Mood and affect are normal. Speech and behavior are normal.  ____________________________________________   LABS (all labs ordered are listed, but only abnormal results are displayed)  Labs Reviewed  CBC WITH DIFFERENTIAL/PLATELET - Abnormal; Notable for the following components:      Result Value   WBC 11.8 (*)    Neutro Abs 9.5 (*)    Abs Immature Granulocytes 0.14 (*)    All other components within normal limits  COMPREHENSIVE METABOLIC PANEL - Abnormal; Notable for the following components:   Potassium 3.2 (*)    Glucose, Bld 150 (*)    Alkaline Phosphatase 143 (*)    GFR calc non Af Amer 56 (*)    All other components within normal limits  LIPASE, BLOOD  URINALYSIS, COMPLETE (UACMP) WITH MICROSCOPIC   ____________________________________________  EKG  none  ____________________________________________  RADIOLOGY  I have personally reviewed the images performed during this visit and I agree with the Radiologist's read.   Interpretation by Radiologist:  Dg Abd 2 Views  Result Date: 03/15/2019 CLINICAL DATA:  Abdominal pain and constipation for 5 days. EXAM: ABDOMEN - 2 VIEW COMPARISON:  07/13/2015 radiographs FINDINGS: The bowel gas pattern is normal. There is no evidence of free air. No radio-opaque calculi or other significant radiographic abnormality is seen. IMPRESSION: Negative. Electronically Signed   By: Margarette Canada M.D.   On: 03/15/2019 20:25    ____________________________________________   PROCEDURES   Procedure(s) performed: None Procedures Critical Care performed:  None ____________________________________________   INITIAL IMPRESSION / ASSESSMENT AND PLAN / ED COURSE  73 y.o. female with history of partial colectomy and nephrectomy in 1992 from colon cancer, diastolic CHF, GERD, hypertension, CAD, constipation who presents for evaluation of constipation x 4 days and decreased urine output in the last 24 hours. Bladder scan  showing 141 cc. Rectal exam showing no impaction. Upon arrival in the ED patient had a decent size BM, soft with no melena. Labs showing mild leukocytosis with WBC 11.8, UA pending. CMP and lipase WNL. KUB negative for large stool burden in the rectal vault or air fluid levels. Clinically no signs of SBO with no vomiting, no distention, and now a normal BM in the ED. No AKI however with uop in 24 hours, will give IVF and check UA. Anticipate dc home on bowel regimen.   Clinical Course as of Mar 14 2152  Nancy Fetter Mar 15, 2019  2151 Patient has had several large bowel movements since arriving to the emergency room.  Feels markedly improved and is requesting discharge. She urinated while having a BM and is unable to provide a specimen at this time and wishes to go home. With no dysuria I think it is reasonable to discharge her now. Will provide bowel regimen for the patient. Discussed my standard return precautions with her and f/u with PCP.   [CV]    Clinical Course User Index [CV] Alfred Levins [place], MD     As part of my medical decision making, I reviewed the following data within the [place] notes reviewed and incorporated, Labs reviewed , Old chart reviewed, Radiograph reviewed , Notes from prior ED visits and [place] Controlled Substance Database    Pertinent labs & imaging results that were available during my care of the patient were reviewed by me and considered in my medical decision making (see chart for details).     ____________________________________________   FINAL CLINICAL IMPRESSION(S) / ED DIAGNOSES  Final diagnoses:  Constipation      NEW MEDICATIONS STARTED DURING THIS VISIT:  ED Discharge Orders    None       Note:  This document was prepared using Dragon voice recognition software and may include unintentional dictation errors.    Rudene Re, MD 03/15/19 2153

## 2019-03-15 NOTE — ED Notes (Signed)
MD Alfred Levins performed rectal exam with this RN and Judson Roch NT as witness.

## 2019-03-15 NOTE — ED Triage Notes (Addendum)
Patient c/o lower abdominal pain, rectal pain/pressure and urinary retention. Patient reports hx of kidney stone 2 weeks ago and hx bowel resection post colon cancer. Patient only has one kidney, other removed after colon cancer moved to kidney. Patient reports last urination was last night. Patient c/o constipation X 4-5 days. Patient's last BM today (smaller than normal).

## 2019-03-15 NOTE — ED Notes (Signed)
Patient had large BM. MD informed. Patient reports significant decrease in pain post BM

## 2019-03-15 NOTE — ED Notes (Signed)
Patient had large liquid BM

## 2019-03-19 DIAGNOSIS — G5790 Unspecified mononeuropathy of unspecified lower limb: Secondary | ICD-10-CM | POA: Insufficient documentation

## 2019-03-19 DIAGNOSIS — G609 Hereditary and idiopathic neuropathy, unspecified: Secondary | ICD-10-CM | POA: Insufficient documentation

## 2019-03-24 ENCOUNTER — Ambulatory Visit: Payer: Medicare Other | Admitting: Cardiovascular Disease

## 2019-06-11 ENCOUNTER — Other Ambulatory Visit: Payer: Self-pay | Admitting: Cardiovascular Disease

## 2019-06-12 NOTE — Telephone Encounter (Signed)
Please review for refill. Appointment of 10/30/18 C. Berge increased Carvedilol to 18.5 mg twice a day. This request is for one 12.5 mg tab twice a day.

## 2019-06-16 ENCOUNTER — Telehealth: Payer: Self-pay | Admitting: Cardiovascular Disease

## 2019-06-16 DIAGNOSIS — E782 Mixed hyperlipidemia: Secondary | ICD-10-CM

## 2019-06-16 NOTE — Telephone Encounter (Signed)
Patient calling Would like to know if the labs that were ordered can be changed from [place] to in office Please advise

## 2019-06-16 NOTE — Telephone Encounter (Signed)
Orders in Epic for fasting lipid and lft to be scheduled for in office lab draw.

## 2019-06-17 NOTE — Telephone Encounter (Signed)
No ans no vm   °

## 2019-06-22 ENCOUNTER — Ambulatory Visit: Payer: Medicare Other | Admitting: Podiatry

## 2019-06-22 ENCOUNTER — Telehealth: Payer: Self-pay | Admitting: Cardiovascular Disease

## 2019-06-22 DIAGNOSIS — E785 Hyperlipidemia, unspecified: Secondary | ICD-10-CM

## 2019-06-22 DIAGNOSIS — I2510 Atherosclerotic heart disease of native coronary artery without angina pectoris: Secondary | ICD-10-CM

## 2019-06-22 NOTE — Telephone Encounter (Signed)
Patient is unable to come to [facility] for her labwork, please change order to [facility]

## 2019-06-22 NOTE — Telephone Encounter (Signed)
Orders [facility]. She will need to be fasting.

## 2019-06-23 ENCOUNTER — Other Ambulatory Visit: Payer: Medicare Other

## 2019-06-24 ENCOUNTER — Other Ambulatory Visit: Payer: Self-pay

## 2019-06-24 ENCOUNTER — Ambulatory Visit (INDEPENDENT_AMBULATORY_CARE_PROVIDER_SITE_OTHER): Payer: Medicare Other

## 2019-06-24 ENCOUNTER — Other Ambulatory Visit: Payer: Self-pay | Admitting: Podiatry

## 2019-06-24 ENCOUNTER — Encounter: Payer: Self-pay | Admitting: Podiatry

## 2019-06-24 ENCOUNTER — Ambulatory Visit (INDEPENDENT_AMBULATORY_CARE_PROVIDER_SITE_OTHER): Payer: Medicare Other | Admitting: Podiatry

## 2019-06-24 VITALS — Temp 97.3°F

## 2019-06-24 DIAGNOSIS — M109 Gout, unspecified: Secondary | ICD-10-CM | POA: Diagnosis not present

## 2019-06-24 DIAGNOSIS — M7752 Other enthesopathy of left foot: Secondary | ICD-10-CM

## 2019-06-24 DIAGNOSIS — M778 Other enthesopathies, not elsewhere classified: Secondary | ICD-10-CM

## 2019-06-24 DIAGNOSIS — M7751 Other enthesopathy of right foot: Secondary | ICD-10-CM | POA: Diagnosis not present

## 2019-06-24 DIAGNOSIS — I2510 Atherosclerotic heart disease of native coronary artery without angina pectoris: Secondary | ICD-10-CM | POA: Diagnosis not present

## 2019-06-24 DIAGNOSIS — M2042 Other hammer toe(s) (acquired), left foot: Secondary | ICD-10-CM

## 2019-06-24 DIAGNOSIS — M779 Enthesopathy, unspecified: Secondary | ICD-10-CM

## 2019-06-24 DIAGNOSIS — M2041 Other hammer toe(s) (acquired), right foot: Secondary | ICD-10-CM

## 2019-06-24 MED ORDER — METHYLPREDNISOLONE 4 MG PO TBPK: ORAL_TABLET | ORAL | 0 refills | Status: DC

## 2019-06-24 NOTE — Progress Notes (Signed)
She presents today crying frantically that both of her feet are hurting and that she is not able to perform her daily activities if she so enjoys without her feet killing her.  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  She states now that she is has burning and pain with redness and swelling across the dorsum of the feet but denies any changes in her past medical history medications allergies surgeries social history and denies any trauma.  States this is been going on now for many months and has worsened.  Review of systems.  She denies fever chills nausea vomiting muscle aches pains calf pain back pain chest pain shortness of breath.  Objective: Vital signs are stable she is alert and oriented x3.  Pulses are palpable.  Neurologic sensorium is slightly diminished per Semmes Weinstein monofilament.  Feet are still moderately edematous the right foot is more erythematous tender on palpation dorsal aspect of the bilateral foot bilaterally.  No open lesions or wounds.  Radiographs taken today demonstrate an osseously mature individual there is no fractures identified osteoarthritic changes of the midfoot are noted.  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  Cannot rule out a seropositive arthropathy.  Plan: Injected the dorsal aspect of the bilateral foot today encouraged her to start a Medrol Dosepak by this afternoon to help alleviate her symptoms.  We will notify her as to the results of the blood work.

## 2019-06-25 ENCOUNTER — Telehealth: Payer: Self-pay | Admitting: Urology

## 2019-06-25 ENCOUNTER — Telehealth: Payer: Self-pay | Admitting: Cardiovascular Disease

## 2019-06-25 ENCOUNTER — Telehealth: Payer: Self-pay

## 2019-06-25 ENCOUNTER — Other Ambulatory Visit
Admission: RE | Admit: 2019-06-25 | Discharge: 2019-06-25 | Disposition: A | Discharge status: Home / Self Care | Payer: Medicare Other | Source: Ambulatory Visit | Attending: Cardiovascular Disease | Admitting: Cardiovascular Disease

## 2019-06-25 DIAGNOSIS — E785 Hyperlipidemia, unspecified: Secondary | ICD-10-CM | POA: Insufficient documentation

## 2019-06-25 DIAGNOSIS — Z85528 Personal history of other malignant neoplasm of kidney: Secondary | ICD-10-CM

## 2019-06-25 DIAGNOSIS — I2510 Atherosclerotic heart disease of native coronary artery without angina pectoris: Secondary | ICD-10-CM | POA: Insufficient documentation

## 2019-06-25 DIAGNOSIS — Z87442 Personal history of urinary calculi: Secondary | ICD-10-CM

## 2019-06-25 DIAGNOSIS — Q600 Renal agenesis, unilateral: Secondary | ICD-10-CM

## 2019-06-25 DIAGNOSIS — IMO0002 Reserved for concepts with insufficient information to code with codable children: Secondary | ICD-10-CM

## 2019-06-25 LAB — CBC WITH DIFFERENTIAL/PLATELET
Basophils Absolute: 0.1 10*3/uL (ref 0.0–0.2)
Basos: 1 %
EOS (ABSOLUTE): 0.1 10*3/uL (ref 0.0–0.4)
Eos: 2 %
Hematocrit: 38.9 % (ref 34.0–46.6)
Hemoglobin: 13.4 g/dL (ref 11.1–15.9)
Immature Grans (Abs): 0 10*3/uL (ref 0.0–0.1)
Immature Granulocytes: 0 %
Lymphocytes Absolute: 2.2 10*3/uL (ref 0.7–3.1)
Lymphs: 27 %
MCH: 28.5 pg (ref 26.6–33.0)
MCHC: 34.4 g/dL (ref 31.5–35.7)
MCV: 83 fL (ref 79–97)
Monocytes Absolute: 0.7 10*3/uL (ref 0.1–0.9)
Monocytes: 8 %
Neutrophils Absolute: 5 10*3/uL (ref 1.4–7.0)
Neutrophils: 62 %
Platelets: 369 10*3/uL (ref 150–450)
RBC: 4.71 x10E6/uL (ref 3.77–5.28)
RDW: 13.3 % (ref 11.7–15.4)
WBC: 8.2 10*3/uL (ref 3.4–10.8)

## 2019-06-25 LAB — LIPID PANEL
Cholesterol: 172 mg/dL (ref 0–200)
HDL: 45 mg/dL (ref >40)
LDL Cholesterol: 100 mg/dL — ABNORMAL HIGH (ref 0–99)
Total CHOL/HDL Ratio: 3.8 RATIO
Triglycerides: 135 mg/dL (ref <150)
VLDL: 27 mg/dL (ref 0–40)

## 2019-06-25 LAB — HEPATIC FUNCTION PANEL
ALT: 22 U/L (ref 0–44)
AST: 23 U/L (ref 15–41)
Albumin: 4.1 g/dL (ref 3.5–5.0)
Alkaline Phosphatase: 131 U/L — ABNORMAL HIGH (ref 38–126)
Bilirubin, Direct: 0.1 mg/dL (ref 0.0–0.2)
Indirect Bilirubin: 0.6 mg/dL (ref 0.3–0.9)
Total Bilirubin: 0.7 mg/dL (ref 0.3–1.2)
Total Protein: 7.6 g/dL (ref 6.5–8.1)

## 2019-06-25 LAB — URIC ACID: Uric Acid: 10.6 mg/dL — ABNORMAL HIGH (ref 2.5–7.1)

## 2019-06-25 LAB — ANA: Anti Nuclear Antibody (ANA): NEGATIVE

## 2019-06-25 LAB — C-REACTIVE PROTEIN: CRP: 12 mg/L — ABNORMAL HIGH (ref 0–10)

## 2019-06-25 LAB — SEDIMENTATION RATE: Sed Rate: 22 mm/hr (ref 0–40)

## 2019-06-25 LAB — RHEUMATOID FACTOR: Rheumatoid fact SerPl-aCnc: 10.2 IU/mL (ref 0.0–13.9)

## 2019-06-25 MED ORDER — COLCHICINE 0.6 MG PO TABS: 0.6000 mg | ORAL_TABLET | Freq: Every day | ORAL | 1 refills | Status: DC

## 2019-06-25 NOTE — Telephone Encounter (Signed)
Returned call to pt with recommendation from [employer], PA> okay to take prednisone, take aspirin at a separate time than prednisone to dec GI upset. Also take both with small snack.   No further questions at this time. Advised pt to call for any further questions or concerns.

## 2019-06-25 NOTE — Telephone Encounter (Signed)
Patient calling in regarding her recent visit with her foot doctor. They prescribed her prednisone 4 mg and patient is calling to see if she should take this or not due to her cardiac hx. Patient is at work but states a detailed message can be left on her voicemail

## 2019-06-25 NOTE — Telephone Encounter (Signed)
Left message for patient to call office and discuss lab results

## 2019-06-25 NOTE — Telephone Encounter (Signed)
-----   Message from Garrel Ridgel, [place] sent at 06/25/2019  1:23 PM EDT ----- Positive for gout.  She still needs to take the medrol dose pack and we need to add colchicine (colcrys) 0.6 mg one twice daily for the next 3 days then one a day after that.

## 2019-06-25 NOTE — Telephone Encounter (Signed)
Patient was notified of results and Dr. Stephenie Acres instructions to take medications.  Script for Colchicine has been sent to pharmacy

## 2019-06-25 NOTE — Telephone Encounter (Signed)
I do not see a direct contraindication upon reviewing her chart.

## 2019-06-25 NOTE — Telephone Encounter (Signed)
Pt. Was diagnosed with gout and is very concerned about how this will affect her health due to having one kidney and she has not been seen by Dr.Stoioff since 10/16/18 and would like to know should she come in for an office visit, labs or ucx? She would like to get some advice from Dr. Bernardo Heater as soon as possible she is very upset.

## 2019-06-26 ENCOUNTER — Encounter: Payer: Self-pay | Admitting: General Surgery

## 2019-06-26 NOTE — Telephone Encounter (Signed)
App made and mailed to patient ° °Carrie Larsen °

## 2019-06-26 NOTE — Telephone Encounter (Signed)
Recent kidney function blood test was normal.  At last visit I had recommended a follow-up in November 2020 with a renal ultrasound.  It does not look like this has been scheduled.  Ultrasound order was entered.

## 2019-07-22 ENCOUNTER — Other Ambulatory Visit: Payer: Self-pay

## 2019-07-22 ENCOUNTER — Ambulatory Visit (INDEPENDENT_AMBULATORY_CARE_PROVIDER_SITE_OTHER): Payer: Medicare Other | Admitting: Podiatry

## 2019-07-22 ENCOUNTER — Encounter: Payer: Self-pay | Admitting: Podiatry

## 2019-07-22 DIAGNOSIS — I2510 Atherosclerotic heart disease of native coronary artery without angina pectoris: Secondary | ICD-10-CM | POA: Diagnosis not present

## 2019-07-22 DIAGNOSIS — M109 Gout, unspecified: Secondary | ICD-10-CM | POA: Diagnosis not present

## 2019-07-22 MED ORDER — COLCHICINE 0.6 MG PO TABS: 0.6000 mg | ORAL_TABLET | Freq: Every day | ORAL | 3 refills | Status: DC

## 2019-07-22 NOTE — Progress Notes (Signed)
She presents today for follow-up of her gout.  She states that she got the colchicine filled but never took it.  She took the Medrol Dosepak and the injections all that made 100% worth of difference.  She states that her feet have not hurt since that day.  Objective: Vital signs are stable she is alert and oriented x3.  There is no erythema edema cellulitis drainage odor she has no reproducible pain on palpation.  Assessment: Resolving gouty capsulitis bilateral foot right greater than left.  Plan: Discussed etiology pathology conservative surgical therapies at this point time went ahead and told her to start taking her colchicine 1 tablet daily I am going to follow-up with blood work checking BUN and creatinine and uric acid.  She has only 1 kidney so we will monitor her use of the colchicine.

## 2019-07-23 ENCOUNTER — Ambulatory Visit: Payer: Medicare Other | Admitting: Nurse Practitioner

## 2019-07-24 LAB — URIC ACID: Uric Acid: 9.9 mg/dL — ABNORMAL HIGH (ref 2.5–7.1)

## 2019-07-24 LAB — BUN+CREAT
BUN/Creatinine Ratio: 19 (ref 12–28)
BUN: 21 mg/dL (ref 8–27)
Creatinine, Ser: 1.08 mg/dL — ABNORMAL HIGH (ref 0.57–1.00)
GFR calc Af Amer: 59 mL/min/{1.73_m2} — ABNORMAL LOW (ref >59)
GFR calc non Af Amer: 51 mL/min/{1.73_m2} — ABNORMAL LOW (ref >59)

## 2019-07-27 ENCOUNTER — Telehealth: Payer: Self-pay

## 2019-07-27 NOTE — Telephone Encounter (Signed)
-----   Message from Andres Ege, RN sent at 07/27/2019  9:09 AM EDT ----- Janace Hoard, please assist pt. Carrie Larsen  ----- Message ----- From: Garrel Ridgel, [place] Sent: 07/25/2019  10:06 AM EDT To: Andres Ege, RN  BUN and creatinine look great uric acid is still elevated.  We will monitor her to see if this will come down.  If not then we will have to start her on allopurinol as she only has 1 kidney so I prefer to not have to do that if possible.

## 2019-07-27 NOTE — Telephone Encounter (Signed)
Left message for patient to call back to discuss lab results. 

## 2019-07-28 ENCOUNTER — Telehealth: Payer: Self-pay | Admitting: *Deleted

## 2019-07-28 DIAGNOSIS — U071 COVID-19: Secondary | ICD-10-CM

## 2019-07-28 HISTORY — DX: COVID-19: U07.1

## 2019-07-28 NOTE — Telephone Encounter (Signed)
Pt called states she is trying to get in touch with Angie in the [place] office to discuss a change of medication, the medication she is taking is making her nauseous.

## 2019-07-30 ENCOUNTER — Other Ambulatory Visit: Payer: Self-pay | Admitting: *Deleted

## 2019-07-30 NOTE — Telephone Encounter (Addendum)
I spoke with pt and she states she has diarrhea and just can't take the colchicine anymore, pt is crying and states she had colon cancer, this is too much. I told pt the colchicine orders stated to take until GI upset, so to stop. Pt states she is not going to take any colchicine. Pt states her sister-in-law said there is a topical colchicine, called Colcigel that can be put on the painful area. I asked pt the status of the gout symptoms. Pt states she didn't have any symptoms at the last office visit. I told pt that I would inform Dr. Milinda Pointer and call again. I told pt to treat the GI upset as her PCP ordered.

## 2019-07-30 NOTE — Telephone Encounter (Signed)
-----   Message from Simone Curia sent at 07/30/2019 10:37 AM EDT ----- Regarding: Patient feels sick from medication Patient called and wanted to speak to the nurse. Angie is out for the next couple of days. Patient was crying and stated that the medication that Dr. Milinda Pointer had prescribed her is making her "very sick" and has "bad diarrhea". Please give the patient a call at your earliest convenience @ [phone]. Thank you!

## 2019-07-30 NOTE — Telephone Encounter (Signed)
Left message for pt to call to inform of proper pharmacy.

## 2019-07-31 ENCOUNTER — Telehealth: Payer: Self-pay | Admitting: *Deleted

## 2019-07-31 MED ORDER — COLCIGEL EX GEL: CUTANEOUS | 3 refills | Status: AC

## 2019-07-31 NOTE — Telephone Encounter (Signed)
[facility] - Jocelyn Lamer asked if the instructions for the Colcigel gel apply after meals. And I confirmed.

## 2019-07-31 NOTE — Telephone Encounter (Signed)
Received notification through RX Request from Dr. Stephenie Acres assistant - A. Prevette, CMA that Dr. Milinda Pointer ordered stop the colchicine and okay to try Colcigel, needed up date pharmacy.

## 2019-07-31 NOTE — Telephone Encounter (Signed)
I spoke with pt 07/30/2019 concerning GI upset with Colchicine, pt discontinued the colchicine and requested a topical medication cicolgel. A. Prevette, CMA to complete the rx order.

## 2019-08-04 ENCOUNTER — Telehealth: Payer: Self-pay | Admitting: *Deleted

## 2019-08-04 NOTE — Telephone Encounter (Signed)
[facility] states Colcigel is not in pt's insurance formulary and will need further research for coverage.

## 2019-08-05 ENCOUNTER — Telehealth: Payer: Self-pay | Admitting: Podiatry

## 2019-08-05 ENCOUNTER — Telehealth: Payer: Self-pay | Admitting: *Deleted

## 2019-08-05 NOTE — Telephone Encounter (Signed)
Pt called about the status of the prior authorization for her colcigel.

## 2019-08-05 NOTE — Telephone Encounter (Signed)
[place] has form been completed by Dr. Milinda Pointer.

## 2019-08-05 NOTE — Telephone Encounter (Signed)
Pt states needs the Colcigel paperwork to pre-cert.

## 2019-08-05 NOTE — Telephone Encounter (Signed)
Forms have been filled out and faxed to [facility] for [place] request for Colcigel Patient has been notified and will wait to see if approved.

## 2019-08-06 NOTE — Telephone Encounter (Signed)
After trying to get an authorization for the [employer] is not going to cover it.  Is there anything else you can recommend she takes for gout?  She is contacting pharmacy to see what Colcigel cost out of pocket and will let me know.  Please advise

## 2019-08-06 NOTE — Telephone Encounter (Signed)
She needs colchicine once daily not three times daily.

## 2019-08-07 NOTE — Telephone Encounter (Signed)
I spoke with patient, she stated that she took two colchicine in same day which made her very nauseaus.  I instructed her to try one tablet daily to see how she responds.  She said she would still call pharmacy to see what Colcigel would cost out of pocket, but she would also try once daily colchicine and if she still had nausea she will let me know.

## 2019-08-18 ENCOUNTER — Telehealth: Payer: Medicare Other | Admitting: Nurse Practitioner

## 2019-08-19 ENCOUNTER — Emergency Department
Admission: EM | Admit: 2019-08-19 | Discharge: 2019-08-19 | Disposition: A | Discharge status: Home / Self Care | Payer: Medicare Other | Attending: Emergency Medicine | Admitting: Emergency Medicine

## 2019-08-19 ENCOUNTER — Other Ambulatory Visit: Payer: Self-pay

## 2019-08-19 ENCOUNTER — Emergency Department: Payer: Medicare Other

## 2019-08-19 ENCOUNTER — Ambulatory Visit: Payer: Medicare Other | Admitting: Podiatry

## 2019-08-19 DIAGNOSIS — J181 Lobar pneumonia, unspecified organism: Secondary | ICD-10-CM | POA: Diagnosis not present

## 2019-08-19 DIAGNOSIS — I5030 Unspecified diastolic (congestive) heart failure: Secondary | ICD-10-CM | POA: Diagnosis not present

## 2019-08-19 DIAGNOSIS — Z79899 Other long term (current) drug therapy: Secondary | ICD-10-CM | POA: Diagnosis not present

## 2019-08-19 DIAGNOSIS — U071 COVID-19: Secondary | ICD-10-CM | POA: Insufficient documentation

## 2019-08-19 DIAGNOSIS — I2510 Atherosclerotic heart disease of native coronary artery without angina pectoris: Secondary | ICD-10-CM | POA: Diagnosis not present

## 2019-08-19 DIAGNOSIS — I110 Hypertensive heart disease with heart failure: Secondary | ICD-10-CM | POA: Insufficient documentation

## 2019-08-19 DIAGNOSIS — R079 Chest pain, unspecified: Secondary | ICD-10-CM | POA: Diagnosis present

## 2019-08-19 DIAGNOSIS — Z85528 Personal history of other malignant neoplasm of kidney: Secondary | ICD-10-CM | POA: Diagnosis not present

## 2019-08-19 DIAGNOSIS — I252 Old myocardial infarction: Secondary | ICD-10-CM | POA: Diagnosis not present

## 2019-08-19 DIAGNOSIS — Z7982 Long term (current) use of aspirin: Secondary | ICD-10-CM | POA: Insufficient documentation

## 2019-08-19 DIAGNOSIS — Z7901 Long term (current) use of anticoagulants: Secondary | ICD-10-CM | POA: Insufficient documentation

## 2019-08-19 DIAGNOSIS — J189 Pneumonia, unspecified organism: Secondary | ICD-10-CM

## 2019-08-19 LAB — COMPREHENSIVE METABOLIC PANEL
ALT: 25 U/L (ref 0–44)
AST: 24 U/L (ref 15–41)
Albumin: 4 g/dL (ref 3.5–5.0)
Alkaline Phosphatase: 124 U/L (ref 38–126)
Anion gap: 11 (ref 5–15)
BUN: 17 mg/dL (ref 8–23)
CO2: 26 mmol/L (ref 22–32)
Calcium: 9.7 mg/dL (ref 8.9–10.3)
Chloride: 104 mmol/L (ref 98–111)
Creatinine, Ser: 0.86 mg/dL (ref 0.44–1.00)
GFR calc Af Amer: >60 mL/min (ref >60)
GFR calc non Af Amer: >60 mL/min (ref >60)
Glucose, Bld: 115 mg/dL — ABNORMAL HIGH (ref 70–99)
Potassium: 3.5 mmol/L (ref 3.5–5.1)
Sodium: 141 mmol/L (ref 135–145)
Total Bilirubin: 0.7 mg/dL (ref 0.3–1.2)
Total Protein: 7.2 g/dL (ref 6.5–8.1)

## 2019-08-19 LAB — CBC WITH DIFFERENTIAL/PLATELET
Abs Immature Granulocytes: 0.03 10*3/uL (ref 0.00–0.07)
Basophils Absolute: 0 10*3/uL (ref 0.0–0.1)
Basophils Relative: 1 %
Eosinophils Absolute: 0.1 10*3/uL (ref 0.0–0.5)
Eosinophils Relative: 2 %
HCT: 44 % (ref 36.0–46.0)
Hemoglobin: 14.4 g/dL (ref 12.0–15.0)
Immature Granulocytes: 0 %
Lymphocytes Relative: 25 %
Lymphs Abs: 1.9 10*3/uL (ref 0.7–4.0)
MCH: 28.8 pg (ref 26.0–34.0)
MCHC: 32.7 g/dL (ref 30.0–36.0)
MCV: 88 fL (ref 80.0–100.0)
Monocytes Absolute: 0.6 10*3/uL (ref 0.1–1.0)
Monocytes Relative: 8 %
Neutro Abs: 4.9 10*3/uL (ref 1.7–7.7)
Neutrophils Relative %: 64 %
Platelets: 286 10*3/uL (ref 150–400)
RBC: 5 MIL/uL (ref 3.87–5.11)
RDW: 13.9 % (ref 11.5–15.5)
WBC: 7.6 10*3/uL (ref 4.0–10.5)
nRBC: 0 % (ref 0.0–0.2)

## 2019-08-19 LAB — TROPONIN I (HIGH SENSITIVITY)
Troponin I (High Sensitivity): 10 ng/L (ref <18)
Troponin I (High Sensitivity): 6 ng/L (ref <18)

## 2019-08-19 LAB — MAGNESIUM: Magnesium: 1.9 mg/dL (ref 1.7–2.4)

## 2019-08-19 LAB — LIPASE, BLOOD: Lipase: 39 U/L (ref 11–51)

## 2019-08-19 MED ORDER — LIDOCAINE VISCOUS HCL 2 % MT SOLN
15.0000 mL | Freq: Once | OROMUCOSAL | Status: AC
  Administered 2019-08-19: 15 mL via ORAL
  Filled 2019-08-19: qty 15

## 2019-08-19 MED ORDER — DOXYCYCLINE MONOHYDRATE 100 MG PO TABS: 100.0000 mg | ORAL_TABLET | Freq: Two times a day (BID) | ORAL | 0 refills | Status: AC

## 2019-08-19 MED ORDER — ACETAMINOPHEN 325 MG PO TABS
650.0000 mg | ORAL_TABLET | Freq: Once | ORAL | Status: AC
  Administered 2019-08-19: 650 mg via ORAL
  Filled 2019-08-19: qty 2

## 2019-08-19 MED ORDER — ALUM & MAG HYDROXIDE-SIMETH 200-200-20 MG/5ML PO SUSP
30.0000 mL | Freq: Once | ORAL | Status: AC
  Administered 2019-08-19: 17:00:00 30 mL via ORAL
  Filled 2019-08-19: qty 30

## 2019-08-19 MED ORDER — KETOROLAC TROMETHAMINE 30 MG/ML IJ SOLN
15.0000 mg | Freq: Once | INTRAMUSCULAR | Status: AC
  Administered 2019-08-19: 17:00:00 15 mg via INTRAVENOUS

## 2019-08-19 MED ORDER — KETOROLAC TROMETHAMINE 30 MG/ML IJ SOLN
INTRAMUSCULAR | Status: AC
  Filled 2019-08-19: qty 1

## 2019-08-19 NOTE — Discharge Instructions (Signed)
Your work-up was reassuring.  There was concern for possible small pneumonia versus just normal atelectasis.  Will cover you with a course of antibiotics.  Your coronavirus testing is pending.  You should stay quarantine at home until results.     Person Under Monitoring Name: Carrie Larsen [facility]  Location: [address] [place]  [id number]   Infection Prevention Recommendations for Individuals Confirmed to have, or Being Evaluated for, 2019 Novel Coronavirus (COVID-19) Infection Who Receive Care at Home  Individuals who are confirmed to have, or are being evaluated for, COVID-19 should follow the prevention steps below until a healthcare provider or local or state health department says they can return to normal activities.  Stay home except to get medical care You should restrict activities outside your home, except for getting medical care. Do not go to work, school, or public areas, and do not use public transportation or taxis.  Call ahead before visiting your doctor Before your medical appointment, call the healthcare provider and tell them that you have, or are being evaluated for, COVID-19 infection. This will help the healthcare providers office take steps to keep other people from getting infected. Ask your healthcare provider to call the local or state health department.  Monitor your symptoms Seek prompt medical attention if your illness is worsening (e.g., difficulty breathing). Before going to your medical appointment, call the healthcare provider and tell them that you have, or are being evaluated for, COVID-19 infection. Ask your healthcare provider to call the local or state health department.  Wear a facemask You should wear a facemask that covers your nose and mouth when you are in the same room with other people and when you visit a healthcare provider. People who live with or visit you should also wear a facemask while they are in the same room with  you.  Separate yourself from other people in your home As much as possible, you should stay in a different room from other people in your home. Also, you should use a separate bathroom, if available.  Avoid sharing household items You should not share dishes, drinking glasses, cups, eating utensils, towels, bedding, or other items with other people in your home. After using these items, you should wash them thoroughly with soap and water.  Cover your coughs and sneezes Cover your mouth and nose with a tissue when you cough or sneeze, or you can cough or sneeze into your sleeve. Throw used tissues in a lined trash can, and immediately wash your hands with soap and water for at least 20 seconds or use an alcohol-based hand rub.  Wash your [facility] your hands often and thoroughly with soap and water for at least 20 seconds. You can use an alcohol-based hand sanitizer if soap and water are not available and if your hands are not visibly dirty. Avoid touching your eyes, nose, and mouth with unwashed hands.   Prevention Steps for Caregivers and Household Members of Individuals Confirmed to have, or Being Evaluated for, COVID-19 Infection Being Cared for in the Home  If you live with, or provide care at home for, a person confirmed to have, or being evaluated for, COVID-19 infection please follow these guidelines to prevent infection:  Follow healthcare providers instructions Make sure that you understand and can help the patient follow any healthcare provider instructions for all care.  Provide for the patients basic needs You should help the patient with basic needs in the home and provide  support for getting groceries, prescriptions, and other personal needs.  Monitor the patients symptoms If they are getting sicker, call his or her medical provider and tell them that the patient has, or is being evaluated for, COVID-19 infection. This will help the healthcare providers office  take steps to keep other people from getting infected. Ask the healthcare provider to call the local or state health department.  Limit the number of people who have contact with the patient If possible, have only one caregiver for the patient. Other household members should stay in another home or place of residence. If this is not possible, they should stay in another room, or be separated from the patient as much as possible. Use a separate bathroom, if available. Restrict visitors who do not have an essential need to be in the home.  Keep older adults, very young children, and other sick people away from the patient Keep older adults, very young children, and those who have compromised immune systems or chronic health conditions away from the patient. This includes people with chronic heart, lung, or kidney conditions, diabetes, and cancer.  Ensure good ventilation Make sure that shared spaces in the home have good air flow, such as from an air conditioner or an opened window, weather permitting.  Wash your hands often Wash your hands often and thoroughly with soap and water for at least 20 seconds. You can use an alcohol based hand sanitizer if soap and water are not available and if your hands are not visibly dirty. Avoid touching your eyes, nose, and mouth with unwashed hands. Use disposable paper towels to dry your hands. If not available, use dedicated cloth towels and replace them when they become wet.  Wear a facemask and gloves Wear a disposable facemask at all times in the room and gloves when you touch or have contact with the patients blood, body fluids, and/or secretions or excretions, such as sweat, saliva, sputum, nasal mucus, vomit, urine, or feces.  Ensure the mask fits over your nose and mouth tightly, and do not touch it during use. Throw out disposable facemasks and gloves after using them. Do not reuse. Wash your hands immediately after removing your facemask and  gloves. If your personal clothing becomes contaminated, carefully remove clothing and launder. Wash your hands after handling contaminated clothing. Place all used disposable facemasks, gloves, and other waste in a lined container before disposing them with other household waste. Remove gloves and wash your hands immediately after handling these items.  Do not share dishes, glasses, or other household items with the patient Avoid sharing household items. You should not share dishes, drinking glasses, cups, eating utensils, towels, bedding, or other items with a patient who is confirmed to have, or being evaluated for, COVID-19 infection. After the person uses these items, you should wash them thoroughly with soap and water.  Wash laundry thoroughly Immediately remove and wash clothes or bedding that have blood, body fluids, and/or secretions or excretions, such as sweat, saliva, sputum, nasal mucus, vomit, urine, or feces, on them. Wear gloves when handling laundry from the patient. Read and follow directions on labels of laundry or clothing items and detergent. In general, wash and dry with the warmest temperatures recommended on the label.  Clean all areas the individual has used often Clean all touchable surfaces, such as counters, tabletops, doorknobs, bathroom fixtures, toilets, phones, keyboards, tablets, and bedside tables, every day. Also, clean any surfaces that may have blood, body fluids, and/or secretions or excretions  on them. Wear gloves when cleaning surfaces the patient has come in contact with. Use a diluted bleach solution (e.g., dilute bleach with 1 part bleach and 10 parts water) or a household disinfectant with a label that says EPA-registered for coronaviruses. To make a bleach solution at home, add 1 tablespoon of bleach to 1 quart (4 cups) of water. For a larger supply, add  cup of bleach to 1 gallon (16 cups) of water. Read labels of cleaning products and follow  recommendations provided on product labels. Labels contain instructions for safe and effective use of the cleaning product including precautions you should take when applying the product, such as wearing gloves or eye protection and making sure you have good ventilation during use of the product. Remove gloves and wash hands immediately after cleaning.  Monitor yourself for signs and symptoms of illness Caregivers and household members are considered close contacts, should monitor their health, and will be asked to limit movement outside of the home to the extent possible. Follow the monitoring steps for close contacts listed on the symptom monitoring form.   ? If you have additional questions, contact your local health department or call the epidemiologist on call at [phone] (available 24/7). ? This guidance is subject to change. For the most up-to-date guidance from [facility], please refer to their website: [email or web address]

## 2019-08-19 NOTE — ED Triage Notes (Addendum)
Pt arrived to ED via EMS with c/o pressure in chest radiating to left shoulder that started at 10 this morning. Pt reports hx of MI 2 years ago with stent placement and HTN. Takes aspirin and plavix. EMS reports 324 aspirin given enroute. NAD noted at this time.

## 2019-08-19 NOTE — ED Notes (Signed)
R-AC PIV removed

## 2019-08-19 NOTE — ED Provider Notes (Signed)
[facility] Emergency Department Provider Note  ____________________________________________   First MD Initiated Contact with Patient 08/19/19 1523     (approximate)  I have reviewed the triage vital signs and the nursing notes.   HISTORY  Chief Complaint Chest Pain    HPI Carrie Larsen is a 73 y.o. female with prior colon cancer, partial left nephrectomy, diastolic dysfunction, MI in 2018 status post 1 stent who presents with some chest discomfort.  Patient endorses around 10 AM she started to have some chest pressure that radiates to her left shoulder.  She says it feels like a burning sensation or a tingling sensation that is on her front as well as her back.  She endorses the discomfort a 4 out of 10.  Nothing makes it better, nothing makes it worse.  Patient is on aspirin and Plavix.  Patient's normal blood pressures are around [id number] systolic.  She did already take 324 of aspirin.  She denies any shortness of breath.  Has had a cough for the past 2 or 3 days.  She is asking if she can get coronavirus testing.   Past Medical History:  Diagnosis Date   CAD (coronary artery disease)    a. 2012 ETT: no ischemia;  b. 06/2017 NSTEMI/PCI: LM nl, LAD nl, D1 70-80p, LCX nl, OM1 90 (3.0 x 23 Xience [place]), RCA dominant, 100p/m, fills via L->R collats, EF 55-65%.   Cancer [facility])    a. s/p partial left nephrectomy.   Colon cancer ([place]) 1992   T3, N1, M0.   Colon polyp [id number]   Diastolic dysfunction    a. 08/2015 Echo: EF 60-65%, no rwma, Gr1 DD, midlly dil LA, PASP 52mmHg. No significant valvular dzs; b. 06/2017 Echo: EF 60-65%, Gr1DD, mildly dil LA.   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   CME (cystoid macular edema), left 11/20/2018   History of kidney cancer 11/20/2018   Nonexudative age-related macular degeneration, bilateral, early dry stage 11/20/2018   Pseudophakia of both eyes 11/20/2018   Olecranon bursitis 10/16/2018   Contusion of knee 10/16/2018   Arthritis of knee 10/16/2018   Arthritis of hand 10/16/2018   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/10/2018   Morbid obesity ([place]) 09/11/2017   Non-STEMI (non-ST elevated myocardial infarction) ([place]) 06/27/2017   Abdominal pain [id number]   Renal colic 0000000   Nephrolithiasis 02/20/2017   History of renal cell carcinoma 02/20/2017   Knee pain 08/14/2016   Strain of hamstring muscle 08/08/2016   Left leg swelling [id number]   Systolic murmur    Retrosternal chest pain 07/08/2015   History of colonic polyps 04/16/2015   Chest pain 03/08/2011   Hyperlipidemia 03/08/2011   HTN (hypertension) 03/08/2011    Past Surgical History:  Procedure Laterality Date   ABDOMINAL HYSTERECTOMY     APPENDECTOMY     CATARACT EXTRACTION W/PHACO Left 04/24/2016   Procedure: CATARACT EXTRACTION PHACO AND INTRAOCULAR LENS PLACEMENT ([place]);  Surgeon: Birder Robson, MD;  Location: [facility] ORS;  Service: Ophthalmology;  Laterality: Left;  [place] 45.8 AP% 16.4 CDE 7.53 FLUID PACK LOT # [id number]   CATARACT EXTRACTION W/PHACO Right 06/05/2016  Procedure: CATARACT EXTRACTION PHACO AND INTRAOCULAR LENS PLACEMENT ([facility]);  Surgeon: Birder Robson, MD;  Location: [facility] ORS;  Service: Ophthalmology;  Laterality: Right;  [place] 25.9 AP% 21.9 CDE 5.68 Fluid pack lot # [id number]   COLON RESECTION  03/04/1991   COLON SURGERY     COLONOSCOPY  03-14-10   Dr Bary Castilla, tubular adenoma at 25 cm.   COLONOSCOPY WITH PROPOFOL N/A 06/07/2015   Procedure: COLONOSCOPY WITH PROPOFOL;  Surgeon: Robert Bellow, MD;  Location: [facility] ENDOSCOPY;  Service: Endoscopy;  Laterality: N/A;   CORONARY STENT  INTERVENTION N/A 06/28/2017   Procedure: CORONARY STENT INTERVENTION;  Surgeon: Yolonda Kida, MD;  Location: [place] CV LAB;  Service: Cardiovascular;  Laterality: N/A;   HERNIA REPAIR  [id number]   umbilical   LEFT HEART CATH AND CORONARY ANGIOGRAPHY N/A 06/28/2017   Procedure: LEFT HEART CATH AND CORONARY ANGIOGRAPHY;  Surgeon: Minna Merritts, MD;  Location: [place] CV LAB;  Service: Cardiovascular;  Laterality: N/A;   LUMBAR LAMINECTOMY/DECOMPRESSION MICRODISCECTOMY Bilateral 03/10/2018   Procedure: Laminectomy and Foraminotomy - Lumbar three-Lumbar four - Lumbar four-Lumbar five - bilateral;  Surgeon: Earnie Larsson, MD;  Location: [place];  Service: Neurosurgery;  Laterality: Bilateral;   NEPHRECTOMY  1992   left- cancer   RIB RESECTION     removal 4 ribs   TONSILLECTOMY      Prior to Admission medications   Medication Sig Start Date End Date Taking? Authorizing Provider  aspirin EC 81 MG tablet Take 1 tablet (81 mg total) by mouth daily. 07/08/17   Theora Gianotti, NP  atorvastatin (LIPITOR) 80 MG tablet Take 80 mg by mouth daily.    [provider]  carvedilol (COREG) 12.5 MG tablet Take 1.5 tablets (18.75 mg total) by mouth 2 (two) times daily with a meal. 10/30/18   Theora Gianotti, NP  clopidogrel (PLAVIX) 75 MG tablet Take 1 tablet (75 mg total) by mouth daily. 07/03/18   Minna Merritts, MD  colchicine 0.6 MG tablet Take 1 tablet (0.6 mg total) by mouth daily. Take 1 tablet TID until GI upset or flare subsides, then for maintenance, take 1 tablet daily. 07/22/19   Hyatt, Max T, DPM  DULoxetine (CYMBALTA) 30 MG capsule  03/19/19   [provider]  esomeprazole (NEXIUM) 40 MG capsule Take 40 mg by mouth daily at 3 pm.     [provider]  furosemide (LASIX) 20 MG tablet Take 20 mg by mouth every morning. 05/06/17   [provider]  gabapentin (NEURONTIN) 100 MG capsule  07/15/19   [provider]  Homeopathic  Products (COLCIGEL) GEL Apply 1 g topically 3 (three) times daily after meals for 30 days, THEN 1 g 3 (three) times daily after meals. 07/31/19 09/29/19  Hyatt, Max T, DPM  hydrochlorothiazide (HYDRODIURIL) 25 MG tablet Take 25 mg by mouth daily at 3 pm.  04/15/16   [provider]  isosorbide mononitrate (IMDUR) 30 MG 24 hr tablet Take 1 tablet (30 mg total) by mouth daily. 07/24/17   Minna Merritts, MD  methylPREDNISolone (MEDROL DOSEPAK) 4 MG TBPK tablet 6 day dose pack - take as directed 06/24/19   Hyatt, Max T, DPM  Multiple Vitamins-Minerals (MULTIVITAMIN WITH MINERALS) tablet Take 1 tablet by mouth daily.    [provider]  nitroGLYCERIN (NITROSTAT) 0.4 MG SL tablet Place 1 tablet (0.4 mg total) under the tongue every 5 (five) minutes x 3 doses as needed for chest pain. 01/12/19  Minna Merritts, MD  traMADol Veatrice [place]) 50 MG tablet  05/18/19   [provider]    Allergies Morphine and related and Latex  Family History  Problem Relation Age of Onset   Colon cancer Mother    Cerebral aneurysm Father    Heart attack Father    Heart attack Brother 48    Social History Social History   Tobacco Use   Smoking status: Never Smoker   Smokeless tobacco: Never Used  Substance Use Topics   Alcohol use: No   Drug use: No      Review of Systems Constitutional: No fever/chills Eyes: No visual changes. ENT: No sore throat. Cardiovascular: Positive chest pain Respiratory: Denies shortness of breath. + cough  Gastrointestinal: No abdominal pain.  No nausea, no vomiting.  No diarrhea.  No constipation. Genitourinary: Negative for dysuria. Musculoskeletal: Negative for back pain. Skin: Negative for rash. Neurological: Negative for headaches, focal weakness or numbness. All other ROS negative ____________________________________________   PHYSICAL EXAM:  VITAL SIGNS: Blood pressure 117/68, pulse 74, temperature 98.4 F (36.9 C), temperature  source Oral, resp. rate 17, height 5\' 10"  (1.778 m), weight 109.3 kg, SpO2 96 %.  Constitutional: Alert and oriented. Well appearing and in no acute distress. Eyes: Conjunctivae are normal. EOMI. Head: Atraumatic. Nose: No congestion/rhinnorhea. Mouth/Throat: Mucous membranes are moist.   Neck: No stridor. Trachea Midline. FROM Cardiovascular: Normal rate, regular rhythm. Grossly normal heart sounds.  Good peripheral circulation.  Equal pulses radially and DP Respiratory: Normal respiratory effort.  No retractions. Lungs CTAB. Gastrointestinal: Soft and nontender. No distention. No abdominal bruits.  Musculoskeletal: No lower extremity tenderness nor edema.  No joint effusions. Neurologic:  Normal speech and language. No gross focal neurologic deficits are appreciated.  Skin:  Skin is warm, dry and intact. No rash noted. Psychiatric: Mood and affect are normal. Speech and behavior are normal. GU: Deferred   ____________________________________________   LABS (all labs ordered are listed, but only abnormal results are displayed)  Labs Reviewed  COMPREHENSIVE METABOLIC PANEL - Abnormal; Notable for the following components:      Result Value   Glucose, Bld 115 (*)    All other components within normal limits  SARS CORONAVIRUS 2 (TAT 6-24 HRS)  CBC WITH DIFFERENTIAL/PLATELET  LIPASE, BLOOD  MAGNESIUM  TROPONIN I (HIGH SENSITIVITY)  TROPONIN I (HIGH SENSITIVITY)   ____________________________________________   ED ECG REPORT I, Vanessa [place], the attending physician, personally viewed and interpreted this ECG.  EKG is sinus rhythm rate of 74, no ST elevation, no T wave inversion, normal intervals, Q waves in the inferior leads, looks similar to prior. ____________________________________________  [place], personally viewed and evaluated these images (plain radiographs) as part of my medical decision making, as well as reviewing the written report by the  radiologist.  ED MD interpretation: Possible left lower lobe atelectasis versus infiltrate.  Official radiology report(s): Dg Chest Portable 1 View  Result Date: 08/19/2019 CLINICAL DATA:  Cough. EXAM: PORTABLE CHEST 1 VIEW COMPARISON:  Radiographs of October 02, 2018. FINDINGS: The heart size and mediastinal contours are within normal limits. No pneumothorax or pleural effusion is noted. Right lung is clear. Mild left basilar subsegmental atelectasis or infiltrate is noted. The visualized skeletal structures are unremarkable. IMPRESSION: Mild left basilar subsegmental atelectasis or infiltrate is noted. Electronically Signed   By: Marijo Conception M.D.   On: 08/19/2019 16:19    ____________________________________________   PROCEDURES  Procedure(s) performed (including Critical Care):  Procedures   ____________________________________________   INITIAL IMPRESSION / ASSESSMENT AND PLAN / ED COURSE   Ilyanna Biber was evaluated in Emergency Department on 08/19/2019 for the symptoms described in the history of present illness. She was evaluated in the context of the global COVID-19 pandemic, which necessitated consideration that the patient might be at risk for infection with the SARS-CoV-2 virus that causes COVID-19. Institutional protocols and algorithms that pertain to the evaluation of patients at risk for COVID-19 are in a state of rapid change based on information released by regulatory bodies including the CDC and federal and state organizations. These policies and algorithms were followed during the patient's care in the ED.    Most Likely DDx:  -MSK (atypical chest pain) but will get cardiac markers to evaluate for ACS given risk factors/age  DDx that was also considered d/t potential to cause harm, but was found less likely based on history and physical (as detailed above): -PNA (no fevers, but does have cough so CXR to evaluate) -PNX (reassured with equal b/l breath sounds,  CXR to evaluate) -Symptomatic anemia (will get H&H) -Pulmonary embolism as no sob at rest, not pleuritic in nature, no hypoxia -Aortic Dissection as no tearing pain and no radiation to the mid back, pulses equal -Pericarditis no rub on exam, EKG changes or hx to suggest dx -Tamponade (no notable SOB, tachycardic, hypotensive) -Esophageal rupture (no h/o diffuse vomitting/no crepitus)  White count is normal no evidence of infection.  No evidence of anemia. Chest xray possible infiltrate.  Reevaluated patient.  Patient is pain in her chest is almost completely gone.  She does have some chronic left shoulder pain that she is followed with her primary care doctor for rotator issue.  We will give a dose of Toradol will await repeat troponin.  Repeat trop rule out.   Will give course of doxy for possible PNA, covid pending.  Pt to quarantine at home until covid results back.       ____________________________________________   FINAL CLINICAL IMPRESSION(S) / ED DIAGNOSES   Final diagnoses:  Chest pain, unspecified type  Community acquired pneumonia of left lower lobe of lung ([place])     MEDICATIONS GIVEN DURING THIS VISIT:  Medications  alum & mag hydroxide-simeth (MAALOX/MYLANTA) 200-200-20 MG/5ML suspension 30 mL (30 mLs Oral Given 08/19/19 1700)    And  lidocaine (XYLOCAINE) 2 % viscous mouth solution 15 mL (15 mLs Oral Given 08/19/19 1701)  acetaminophen (TYLENOL) tablet 650 mg (650 mg Oral Given 08/19/19 1659)  ketorolac (TORADOL) 30 MG/ML injection 15 mg (15 mg Intravenous Given 08/19/19 1702)     ED Discharge Orders         Ordered    doxycycline (ADOXA) 100 MG tablet  2 times daily     08/19/19 1802           Note:  This document was prepared using Dragon voice recognition software and may include unintentional dictation errors.   Vanessa [place], MD 08/19/19 [phone]

## 2019-08-20 ENCOUNTER — Telehealth: Payer: Self-pay | Admitting: Emergency Medicine

## 2019-08-20 LAB — SARS CORONAVIRUS 2 (TAT 6-24 HRS): SARS Coronavirus 2: POSITIVE — AB

## 2019-08-20 NOTE — Telephone Encounter (Signed)
Called patient to inform patient of positive covid test. Explained cdc guidelines for quarnatine/contacts.  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  Says there are several other caregivers there.  I asked her to call [facility] to ask them if other staff needs to be tested in this situation.  She agrees.

## 2019-09-03 ENCOUNTER — Ambulatory Visit: Payer: Medicare Other | Admitting: Nurse Practitioner

## 2019-09-21 ENCOUNTER — Ambulatory Visit: Payer: Medicare Other | Admitting: Physician Assistant

## 2019-09-27 NOTE — Progress Notes (Deleted)
Cardiology Office Note    Date:  09/27/2019   ID:  Carrie Larsen Nov 27, 1945, MRN [id number]  PCP:  [place]  Cardiologist:  Carrie Rogue, MD  Electrophysiologist:  None   Chief Complaint: ***  History of Present Illness:   Carrie Larsen is a 73 y.o. female with history of recently diagnosed COVID-29 infection on 08/19/2019, CAD with non-STEMI in 06/2017 status post PCI to OM1, RCC status post partial left nephrectomy, colon cancer, HTN, HLD, obesity, spinal stenosis, and GERD who presents for ***.  Patient was admitted to the hospital in 06/2017 with a non-STEMI with cath showing severe OM1 disease with CTO of the RCA.  The OM1 was successfully treated with PCI/DES.  Patient was seen in the ED in 09/2018 with chest pain with normal troponins and EKG being nonacute.  She was evaluated by Dr. Velva Larsen in 01/2019 after noting bilateral leg pain with exertion with noninvasive vascular studies showing normal ABIs bilaterally with toe brachial index slightly decreased on the left side.  Duplex showed moderate right popliteal artery stenosis and moderate left TP trunk disease.  Patient symptoms were not felt to be due to peripheral arterial disease and seemed to be related to his neuropathy more than anything else.  She was advised to continue aggressive medical therapy and discuss further treatment options with her PCP.  More recently, she was seen in the ED in late 07/2019 with chest discomfort that radiated to her left shoulder.  High-sensitivity troponin of 10 with a delta of 6.  Patient requested to get tested for Covid which was subsequently noted to be positive.  CBC unremarkable.  Potassium 3.5, renal and liver function normal.  Chest x-ray showed mild left basilar subsegmental atelectasis or infiltrate.  EKG nonacute.  She was treated for atypical chest pain as well as given doxycycline for potential pneumonia.  ***   Labs: 07/2019 - magnesium 1.9, potassium 3.5, BUN 17,  serum creatinine 0.86, albumin 4.0, AST/ALT normal, Hgb 14.4, PLT 286 05/2019 - TC 172, TG 135, HDL 45, LDL 100   Past Medical History:  Diagnosis Date  . CAD (coronary artery disease)    a. 2012 ETT: no ischemia;  b. 06/2017 NSTEMI/PCI: LM nl, LAD nl, D1 70-80p, LCX nl, OM1 90 (3.0 x 23 Xience [place]), RCA dominant, 100p/m, fills via L->R collats, EF 55-65%.  . Cancer [facility])    a. s/p partial left nephrectomy.  . Colon cancer ([place]) 1992   T3, N1, M0.  . Colon polyp 2011  . Diastolic dysfunction    a. 08/2015 Echo: EF 60-65%, no rwma, Gr1 DD, midlly dil LA, PASP 3mmHg. No significant valvular dzs; b. 06/2017 Echo: EF 60-65%, Gr1DD, mildly dil LA.  . Essential hypertension   . GERD (gastroesophageal reflux disease)   . History of kidney stones    passed - 2  . Hyperlipidemia   . Lumbar spinal stenosis   . Myocardial infarction ([place])    06/2017  . Retinal cyst     Past Surgical History:  Procedure Laterality Date  . ABDOMINAL HYSTERECTOMY    . APPENDECTOMY    . CATARACT EXTRACTION W/PHACO Left 04/24/2016   Procedure: CATARACT EXTRACTION PHACO AND INTRAOCULAR LENS PLACEMENT (IOC);  Surgeon: Birder Robson, MD;  Location: [facility] ORS;  Service: Ophthalmology;  Laterality: Left;  [place] 45.8 AP% 16.4 CDE 7.53 FLUID PACK LOT # [id number]  . CATARACT EXTRACTION W/PHACO Right 06/05/2016   Procedure: CATARACT EXTRACTION PHACO AND INTRAOCULAR LENS  PLACEMENT (IOC);  Surgeon: Birder Robson, MD;  Location: [facility] ORS;  Service: Ophthalmology;  Laterality: Right;  [place] 25.9 AP% 21.9 CDE 5.68 Fluid pack lot # [id number]  . COLON RESECTION  03/04/1991  . COLON SURGERY    . COLONOSCOPY  03-14-10   Dr Bary Castilla, tubular adenoma at 25 cm.  . COLONOSCOPY WITH PROPOFOL N/A 06/07/2015   Procedure: COLONOSCOPY WITH PROPOFOL;  Surgeon: Robert Bellow, MD;  Location: [facility] ENDOSCOPY;  Service: Endoscopy;  Laterality: N/A;  . CORONARY STENT INTERVENTION N/A 06/28/2017   Procedure: CORONARY STENT INTERVENTION;   Surgeon: Yolonda Kida, MD;  Location: [place] CV LAB;  Service: Cardiovascular;  Laterality: N/A;  . HERNIA REPAIR  [id number]   umbilical  . LEFT HEART CATH AND CORONARY ANGIOGRAPHY N/A 06/28/2017   Procedure: LEFT HEART CATH AND CORONARY ANGIOGRAPHY;  Surgeon: Minna Merritts, MD;  Location: [place] CV LAB;  Service: Cardiovascular;  Laterality: N/A;  . LUMBAR LAMINECTOMY/DECOMPRESSION MICRODISCECTOMY Bilateral 03/10/2018   Procedure: Laminectomy and Foraminotomy - Lumbar three-Lumbar four - Lumbar four-Lumbar five - bilateral;  Surgeon: Earnie Larsson, MD;  Location: [place];  Service: Neurosurgery;  Laterality: Bilateral;  . NEPHRECTOMY  1992   left- cancer  . RIB RESECTION     removal 4 ribs  . TONSILLECTOMY      Current Medications: No outpatient medications have been marked as taking for the 09/29/19 encounter (Appointment) with Carrie Mu, PA-C.    Allergies:   Morphine and related and Latex   Social History   Socioeconomic History  . Marital status: Single    Spouse name: Not on file  . Number of children: Not on file  . Years of education: Not on file  . Highest education level: Not on file  Occupational History  . Occupation: Metallurgist: [employer]  . Financial resource strain: Not on file  . Food insecurity    Worry: Not on file    Inability: Not on file  . Transportation needs    Medical: Not on file    Non-medical: Not on file  Tobacco Use  . Smoking status: Never Smoker  . Smokeless tobacco: Never Used  Substance and Sexual Activity  . Alcohol use: No  . Drug use: No  . Sexual activity: Not on file  Lifestyle  . Physical activity    Days per week: Not on file    Minutes per session: Not on file  . Stress: Not on file  Relationships  . Social Herbalist on phone: Not on file    Gets together: Not on file    Attends religious service: Not on file    Active member of club or organization: Not on file     Attends meetings of clubs or organizations: Not on file    Relationship status: Not on file  Other Topics Concern  . Not on file  Social History Narrative  . Not on file     Family History:  The patient's family history includes Cerebral aneurysm in her father; Colon cancer in her mother; Heart attack in her father; Heart attack (age of onset: 48) in her brother.  ROS:   ROS   EKGs/Labs/Other Studies Reviewed:    Studies reviewed were summarized above. The additional studies were reviewed today: As above.  EKG:  EKG is ordered today.  The EKG ordered today demonstrates ***  Recent Labs: 08/19/2019: ALT 25; BUN 17; Creatinine, Ser  0.86; Hemoglobin 14.4; Magnesium 1.9; Platelets 286; Potassium 3.5; Sodium 141  Recent Lipid Panel    Component Value Date/Time   CHOL 172 06/25/2019 1018   CHOL 169 06/26/2018 0823   TRIG 135 06/25/2019 1018   HDL 45 06/25/2019 1018   HDL 51 06/26/2018 0823   CHOLHDL 3.8 06/25/2019 1018   VLDL 27 06/25/2019 1018   LDLCALC 100 (H) 06/25/2019 1018   LDLCALC 88 06/26/2018 0823    PHYSICAL EXAM:    VS:  There were no vitals taken for this visit.  BMI: There is no height or weight on file to calculate BMI.  Physical Exam  Wt Readings from Last 3 Encounters:  08/19/19 241 lb (109.3 kg)  03/15/19 262 lb 5.6 oz (119 kg)  01/29/19 263 lb (119.3 kg)     ASSESSMENT & PLAN:   1. ***  Disposition: F/u with Dr. Rockey Situ or an APP in ***.   Medication Adjustments/Labs and Tests Ordered: Current medicines are reviewed at length with the patient today.  Concerns regarding medicines are outlined above. Medication changes, Labs and Tests ordered today are summarized above and listed in the Patient Instructions accessible in Encounters.   Signed, Christell Faith, PA-C 09/27/2019 9:13 AM     [facility] [address] [place], [place] [address] [phone]

## 2019-09-28 ENCOUNTER — Telehealth: Payer: Self-pay | Admitting: Cardiovascular Disease

## 2019-09-28 ENCOUNTER — Other Ambulatory Visit: Payer: Self-pay

## 2019-09-28 MED ORDER — CLOPIDOGREL BISULFATE 75 MG PO TABS: 75.0000 mg | ORAL_TABLET | Freq: Every day | ORAL | 1 refills | Status: DC

## 2019-09-28 NOTE — Telephone Encounter (Signed)
clopidogrel (PLAVIX) 75 MG tablet 90 tablet 1 09/28/2019    Sig - Route: Take 1 tablet (75 mg total) by mouth daily. - Oral   Sent to pharmacy as: clopidogrel (PLAVIX) 75 MG tablet   E-Prescribing Status: Sent to pharmacy (09/28/2019 9:52 AM EST)

## 2019-09-28 NOTE — Telephone Encounter (Signed)
°*  STAT* If patient is at the pharmacy, call can be transferred to refill team.   1. Which medications need to be refilled? (please list name of each medication and dose if known)   plavix 75 mg po q d  2. Which pharmacy/location (including street and city if local pharmacy) is medication to be sent to?  [place]   3. Do they need a 30 day or 90 day supply? [place]

## 2019-09-29 ENCOUNTER — Ambulatory Visit: Payer: Medicare Other | Admitting: Physician Assistant

## 2019-10-06 ENCOUNTER — Ambulatory Visit: Payer: Medicare Other | Admitting: Urology

## 2019-10-07 ENCOUNTER — Ambulatory Visit: Payer: Medicare Other | Admitting: Urology

## 2019-10-08 ENCOUNTER — Other Ambulatory Visit: Payer: Self-pay

## 2019-10-08 ENCOUNTER — Ambulatory Visit (INDEPENDENT_AMBULATORY_CARE_PROVIDER_SITE_OTHER): Payer: Medicare Other | Admitting: Urology

## 2019-10-08 ENCOUNTER — Ambulatory Visit: Payer: Medicare Other

## 2019-10-08 ENCOUNTER — Encounter: Payer: Self-pay | Admitting: Urology

## 2019-10-08 VITALS — BP 189/77 | HR 80 | Ht 67.0 in | Wt 240.0 lb

## 2019-10-08 DIAGNOSIS — I2510 Atherosclerotic heart disease of native coronary artery without angina pectoris: Secondary | ICD-10-CM | POA: Diagnosis not present

## 2019-10-08 DIAGNOSIS — IMO0002 Reserved for concepts with insufficient information to code with codable children: Secondary | ICD-10-CM

## 2019-10-08 DIAGNOSIS — Q600 Renal agenesis, unilateral: Secondary | ICD-10-CM

## 2019-10-08 DIAGNOSIS — R300 Dysuria: Secondary | ICD-10-CM | POA: Diagnosis not present

## 2019-10-08 LAB — MICROSCOPIC EXAMINATION
Bacteria, UA: NONE SEEN
RBC, Urine: NONE SEEN /hpf (ref 0–2)

## 2019-10-08 LAB — URINALYSIS, COMPLETE
Bilirubin, UA: NEGATIVE
Glucose, UA: NEGATIVE
Ketones, UA: NEGATIVE
Leukocytes,UA: NEGATIVE
Nitrite, UA: NEGATIVE
Protein,UA: NEGATIVE
RBC, UA: NEGATIVE
Specific Gravity, UA: 1.015 (ref 1.005–1.030)
Urobilinogen, Ur: 0.2 mg/dL (ref 0.2–1.0)
pH, UA: 6.5 (ref 5.0–7.5)

## 2019-10-08 LAB — BLADDER SCAN AMB NON-IMAGING: Scan Result: 0

## 2019-10-08 NOTE — Progress Notes (Signed)
10/08/2019 8:18 AM   [facility] 11/26/1946 [id number]  Referring provider: [facility] [address] [place],  [place] [address]  Chief Complaint  Patient presents with  . Dysuria    HPI: Carrie Larsen is a 73 year old female with a history of RCC (s/p left nephrectomy ~27 years ago), history of nephrolithiasis and nocturia who presents today complaining of a UTI.  History of RCC S/P left nephrectomy ~ 27 years ago  History of nephrolithiasis RUS 11/2018 no acute findings.  No hydronephrosis.  Prior left nephrectomy.  No visible right renal stones.  KUB 02/2019 negative   Today, she is having symptoms of burning, nocturia and incontinence.  She is concerned she may have a urinary tract infection.  She states that she has noticed a decrease in her urination during the day, but she is still experiencing large volumes of nighttime incontinence.  She states that she has just felt "awful" for the last couple weeks.  Patient denies any gross hematuria or suprapubic/flank pain.  Patient denies any fevers, chills, nausea or vomiting.  Her UA is benign.  Her PVR 0 mL.  She is scheduled for renal ultrasound this afternoon.  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  She states she is feeling overwhelmed with amount responsibility and expectations regarding caring for these 2 individuals.  She feels that the other care providers hired to take care of the 2 individuals are not doing their part, so she has increased burden of taking over their responsibilities.    PMH: Past Medical History:  Diagnosis Date  . CAD (coronary artery disease)    a. 2012 ETT: no ischemia;  b. 06/2017 NSTEMI/PCI: LM nl, LAD nl, D1 70-80p, LCX nl, OM1 90 (3.0 x 23 Xience [place]), RCA dominant, 100p/m, fills via L->R collats, EF 55-65%.  . Cancer [facility])    a. s/p partial left nephrectomy.  . Colon  cancer ([place]) 1992   T3, N1, M0.  . Colon polyp 2011  . Diastolic dysfunction    a. 08/2015 Echo: EF 60-65%, no rwma, Gr1 DD, midlly dil LA, PASP 44mmHg. No significant valvular dzs; b. 06/2017 Echo: EF 60-65%, Gr1DD, mildly dil LA.  . Essential hypertension   . GERD (gastroesophageal reflux disease)   . History of kidney stones    passed - 2  . Hyperlipidemia   . Lumbar spinal stenosis   . Myocardial infarction ([place])    06/2017  . Retinal cyst     Surgical History: Past Surgical History:  Procedure Laterality Date  . ABDOMINAL HYSTERECTOMY    . APPENDECTOMY    . CATARACT EXTRACTION W/PHACO Left 04/24/2016   Procedure: CATARACT EXTRACTION PHACO AND INTRAOCULAR LENS PLACEMENT (IOC);  Surgeon: Birder Robson, MD;  Location: [facility] ORS;  Service: Ophthalmology;  Laterality: Left;  [place] 45.8 AP% 16.4 CDE 7.53 FLUID PACK LOT # [id number]  . CATARACT EXTRACTION W/PHACO Right 06/05/2016   Procedure: CATARACT EXTRACTION PHACO AND INTRAOCULAR LENS PLACEMENT (IOC);  Surgeon: Birder Robson, MD;  Location: [facility] ORS;  Service: Ophthalmology;  Laterality: Right;  [place] 25.9 AP% 21.9 CDE 5.68 Fluid pack lot # [id number]  . COLON RESECTION  03/04/1991  . COLON SURGERY    . COLONOSCOPY  03-14-10   Dr Bary Castilla, tubular adenoma at 25 cm.  . COLONOSCOPY WITH PROPOFOL N/A 06/07/2015   Procedure: COLONOSCOPY WITH PROPOFOL;  Surgeon: Dellis Filbert  Amedeo Kinsman, MD;  Location: [facility] ENDOSCOPY;  Service: Endoscopy;  Laterality: N/A;  . CORONARY STENT INTERVENTION N/A 06/28/2017   Procedure: CORONARY STENT INTERVENTION;  Surgeon: Yolonda Kida, MD;  Location: [place] CV LAB;  Service: Cardiovascular;  Laterality: N/A;  . HERNIA REPAIR  [id number]   umbilical  . LEFT HEART CATH AND CORONARY ANGIOGRAPHY N/A 06/28/2017   Procedure: LEFT HEART CATH AND CORONARY ANGIOGRAPHY;  Surgeon: Minna Merritts, MD;  Location: [place] CV LAB;  Service: Cardiovascular;  Laterality: N/A;  . LUMBAR LAMINECTOMY/DECOMPRESSION  MICRODISCECTOMY Bilateral 03/10/2018   Procedure: Laminectomy and Foraminotomy - Lumbar three-Lumbar four - Lumbar four-Lumbar five - bilateral;  Surgeon: Earnie Larsson, MD;  Location: [place];  Service: Neurosurgery;  Laterality: Bilateral;  . NEPHRECTOMY  1992   left- cancer  . RIB RESECTION     removal 4 ribs  . TONSILLECTOMY      Home Medications:  Allergies as of 10/08/2019      Reactions   Morphine And Related Nausea Only   Latex Rash      Medication List       Accurate as of October 08, 2019 11:59 PM. If you have any questions, ask your nurse or doctor.        aspirin EC 81 MG tablet Take 1 tablet (81 mg total) by mouth daily.   atorvastatin 80 MG tablet Commonly known as: LIPITOR Take 80 mg by mouth daily.   carvedilol 12.5 MG tablet Commonly known as: COREG Take 1.5 tablets (18.75 mg total) by mouth 2 (two) times daily with a meal.   clopidogrel 75 MG tablet Commonly known as: PLAVIX Take 1 tablet (75 mg total) by mouth daily.   colchicine 0.6 MG tablet Take 1 tablet (0.6 mg total) by mouth daily. Take 1 tablet TID until GI upset or flare subsides, then for maintenance, take 1 tablet daily.   DULoxetine 30 MG capsule Commonly known as: CYMBALTA   esomeprazole 40 MG capsule Commonly known as: NEXIUM Take 40 mg by mouth daily at 3 pm.   furosemide 20 MG tablet Commonly known as: LASIX Take 20 mg by mouth every morning.   gabapentin 100 MG capsule Commonly known as: NEURONTIN   hydrochlorothiazide 25 MG tablet Commonly known as: HYDRODIURIL Take 25 mg by mouth daily at 3 pm.   isosorbide mononitrate 30 MG 24 hr tablet Commonly known as: IMDUR Take 1 tablet (30 mg total) by mouth daily.   methylPREDNISolone 4 MG Tbpk tablet Commonly known as: MEDROL DOSEPAK 6 day dose pack - take as directed   multivitamin with minerals tablet Take 1 tablet by mouth daily.   nitroGLYCERIN 0.4 MG SL tablet Commonly known as: NITROSTAT Place 1 tablet (0.4 mg  total) under the tongue every 5 (five) minutes x 3 doses as needed for chest pain.   traMADol 50 MG tablet Commonly known as: ULTRAM       Allergies:  Allergies  Allergen Reactions  . Morphine And Related Nausea Only  . Latex Rash    Family History: Family History  Problem Relation Age of Onset  . Colon cancer Mother   . Cerebral aneurysm Father   . Heart attack Father   . Heart attack Brother 28    Social History:  reports that she has never smoked. She has never used smokeless tobacco. She reports that she does not drink alcohol or use drugs.  ROS: UROLOGY Frequent Urination?: No Hard to postpone urination?: No Burning/pain with urination?:  Yes Get up at night to urinate?: Yes Leakage of urine?: Yes Urine stream starts and stops?: No Trouble starting stream?: No Do you have to strain to urinate?: No Blood in urine?: No Urinary tract infection?: Yes Sexually transmitted disease?: No Injury to kidneys or bladder?: No Painful intercourse?: No Weak stream?: No Currently pregnant?: No Vaginal bleeding?: No Last menstrual period?: n  Gastrointestinal Nausea?: No Vomiting?: No Indigestion/heartburn?: No Diarrhea?: No Constipation?: No  Constitutional Fever: No Night sweats?: No Weight loss?: No Fatigue?: No  Skin Skin rash/lesions?: No Itching?: No  Eyes Blurred vision?: No Double vision?: No  Ears/Nose/Throat Sore throat?: No Sinus problems?: No  Hematologic/Lymphatic Swollen glands?: No Easy bruising?: No  Cardiovascular Leg swelling?: No Chest pain?: No  Respiratory Cough?: No Shortness of breath?: No  Endocrine Excessive thirst?: No  Musculoskeletal Back pain?: No Joint pain?: No  Neurological Headaches?: No Dizziness?: No  Psychologic Depression?: No Anxiety?: No  Physical Exam: BP (!) 189/77   Pulse 80   Ht 5\' 7"  (1.702 m)   Wt 240 lb (108.9 kg)   BMI 37.59 kg/m   Constitutional:  Well nourished. Alert and  oriented, No acute distress. HEENT: [place] AT, moist mucus membranes.  Trachea midline, no masses. Cardiovascular: No clubbing, cyanosis, or edema. Respiratory: Normal respiratory effort, no increased work of breathing. Neurologic: Grossly intact, no focal deficits, moving all 4 extremities. Psychiatric: Normal mood and affect.  Laboratory Data: Lab Results  Component Value Date   WBC 7.6 08/19/2019   HGB 14.4 08/19/2019   HCT 44.0 08/19/2019   MCV 88.0 08/19/2019   PLT 286 08/19/2019    Lab Results  Component Value Date   CREATININE 0.86 08/19/2019    No results found for: PSA  No results found for: TESTOSTERONE  Lab Results  Component Value Date   HGBA1C 5.8 (H) 06/27/2017    Lab Results  Component Value Date   TSH 2.592 10/22/2016       Component Value Date/Time   CHOL 172 06/25/2019 1018   CHOL 169 06/26/2018 0823   HDL 45 06/25/2019 1018   HDL 51 06/26/2018 0823   CHOLHDL 3.8 06/25/2019 1018   VLDL 27 06/25/2019 1018   LDLCALC 100 (H) 06/25/2019 1018   LDLCALC 88 06/26/2018 0823    Lab Results  Component Value Date   AST 24 08/19/2019   Lab Results  Component Value Date   ALT 25 08/19/2019   No components found for: ALKALINEPHOPHATASE No components found for: BILIRUBINTOTAL  No results found for: ESTRADIOL  Urinalysis Component     Latest Ref Rng & Units 10/08/2019  Specific Gravity, UA     1.005 - 1.030 1.015  pH, UA     5.0 - 7.5 6.5  Color, UA     Yellow Yellow  Appearance Ur     Clear Clear  Leukocytes,UA     Negative Negative  Protein,UA     Negative/Trace Negative  Glucose, UA     Negative Negative  Ketones, UA     Negative Negative  RBC, UA     Negative Negative  Bilirubin, UA     Negative Negative  Urobilinogen, Ur     0.2 - 1.0 mg/dL 0.2  Nitrite, UA     Negative Negative  Microscopic Examination      See below:   Component     Latest Ref Rng & Units 10/08/2019  WBC, UA     0 - 5 /hpf 0-5  RBC  0 - 2 /hpf  None seen  Epithelial Cells (non renal)     0 - 10 /hpf 0-10  Bacteria, UA     None seen/Few None seen    I have reviewed the labs.   Pertinent Imaging: Results for ALEXIAH, CITTADINO [facility] (MRN [id number]) as of 10/13/2019 08:14  Ref. Range 10/08/2019 11:04  Scan Result Unknown 0     Assessment & Plan:    1. Dysuria - Urinalysis, Complete -benign - CULTURE, URINE COMPREHENSIVE -to rule out indolent infection as patient states she just feels awful - Bladder Scan (Post Void Residual) in office  2. Solitary kidney - Renal ultrasound pending - Basic metabolic panel; Future  3.  Situational anxiety Offered patient referral to a therapist and encouraged her to reach out to her primary care regarding the situation  Return for pending rus  and lab results.  These notes generated with voice recognition software. I apologize for typographical errors.  Carrie Council, PA-C  [facility] [address]  [place], [place] [address] [phone]  I spent 25 with this patient in a face to face  Visit of which greater than 50% was spent in counseling and coordination of care with the patient dedicated to her distress with her current employer.

## 2019-10-11 LAB — CULTURE, URINE COMPREHENSIVE

## 2019-10-12 ENCOUNTER — Ambulatory Visit: Payer: Medicare Other | Admitting: Physician Assistant

## 2019-10-12 ENCOUNTER — Telehealth: Payer: Self-pay | Admitting: Family Medicine

## 2019-10-12 NOTE — Telephone Encounter (Signed)
-----   Message from Nori Riis, PA-C sent at 10/12/2019  8:17 AM EST ----- Please let Mrs. [place] know that her urine culture was negative.  She did not get her RUS or her blood work and I encourage her to do so.

## 2019-10-12 NOTE — Telephone Encounter (Signed)
LMOM for patient to return call.

## 2019-10-13 NOTE — Telephone Encounter (Signed)
Pt. Called back I gave her ucx results and encouraged her to get the RUS rescheduled and blood work done. Pt. Stated she will call this week and get those rescheduled.

## 2019-10-14 ENCOUNTER — Ambulatory Visit (INDEPENDENT_AMBULATORY_CARE_PROVIDER_SITE_OTHER): Payer: Medicare Other | Admitting: Podiatry

## 2019-10-14 ENCOUNTER — Encounter: Payer: Self-pay | Admitting: Podiatry

## 2019-10-14 ENCOUNTER — Other Ambulatory Visit: Payer: Self-pay

## 2019-10-14 DIAGNOSIS — I2510 Atherosclerotic heart disease of native coronary artery without angina pectoris: Secondary | ICD-10-CM

## 2019-10-14 DIAGNOSIS — M109 Gout, unspecified: Secondary | ICD-10-CM

## 2019-10-14 NOTE — Progress Notes (Signed)
She presents today for follow-up of her gout bilaterally right foot greater than left states that her not having any pain and very appreciative for that.  States that she stopped the colchicine after she finished her 30 pills and did not refill it because of her single kidney.  Objective: Vital signs are stable she is alert and oriented x3 pulses are palpable.  No pain on palpation or range of motion of the affected joints.  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  Plan: I had planned on requesting a BUN and creatinine today as well as a uric acid level however she states that she is seeing her nephrologist/urologist next week and that he will more than likely do all of these tests.  I explained to her that there is no reason to do another set of blood work if he is going to do it.  I will request copies of the blood work.  I will follow-up with her in 3 months at which time more than likely we will do another uric acid just to make sure that she is not diabetic chronic gout patient.

## 2019-10-15 ENCOUNTER — Ambulatory Visit: Payer: Medicare Other | Admitting: Urology

## 2019-10-20 ENCOUNTER — Ambulatory Visit: Payer: Medicare Other | Admitting: Cardiovascular Disease

## 2019-11-09 ENCOUNTER — Encounter: Payer: Self-pay | Admitting: Urology

## 2019-11-09 NOTE — Progress Notes (Deleted)
Cardiology Office Note  Date:  11/09/2019   ID:  Carrie Larsen, Carrie Larsen 09-May-1946, MRN [id number]  PCP:  [facility]   No chief complaint on file.   HPI:  Carrie Larsen is a very pleasant 73 year old woman with a history of  Coronary artery disease PCI to an OM vessel 06/2017 Occluded RCA with collaterals from left to right hyperlipidemia,  colon cancer,  status post nephrectomy on the left, who presents for routine followup of her coronary artery disease, hyperlipidemia  No recent lipid panel available Chronic back pain, had surgery, much better Active  Completed aspirin Plavix for one year Now having significant bruising  Potassium 3.5 in 02/2018 While in the hospital for back surgery Hospital records reviewed with the patient in detail  Denies any chest pain, SOB on exertion  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 Stop the medication Tolerating Lipitor 80 daily  Significant bruising from the Plavix  Declined EKG  Other past medical history reviewed Hospital admission August 2018 for non-STEMI Troponin up to 0.88 CT scan with no PE Cardiac catheterization with PCI of OM vessel There is occluded proximal to mid RCA with collaterals from left to right, appeared chronic  Echocardiogram with ejection fraction 55-65% Previous total cholesterol 324 Statin intolerance in the past Repeat lab work showing total cholesterol 193 on Lipitor 80 mg daily  She reports that she has tried several cholesterol medications and all of these have called myalgias. She recalls trying Lipitor, simvastatin, Crestor, possibly others   She reports having an episode of left-sided numbness on her face 11/14/2013. Workup was negative. Including cardiac enzymes, basic metabolic panels, CBC, chest x-ray and urinalysis   PMH:   has a past medical history of CAD (coronary artery disease),  Cancer ([place]), Colon cancer ([place]) (1992), Colon polyp ([id number]), Diastolic dysfunction, Essential hypertension, GERD (gastroesophageal reflux disease), History of kidney stones, Hyperlipidemia, Lumbar spinal stenosis, Myocardial infarction ([place]), and Retinal cyst.  PSH:    Past Surgical History:  Procedure Laterality Date  . ABDOMINAL HYSTERECTOMY    . APPENDECTOMY    . CATARACT EXTRACTION W/PHACO Left 04/24/2016   Procedure: CATARACT EXTRACTION PHACO AND INTRAOCULAR LENS PLACEMENT (IOC);  Surgeon: Birder Robson, MD;  Location: [facility] ORS;  Service: Ophthalmology;  Laterality: Left;  [place] 45.8 AP% 16.4 CDE 7.53 FLUID PACK LOT # [id number]  . CATARACT EXTRACTION W/PHACO Right 06/05/2016   Procedure: CATARACT EXTRACTION PHACO AND INTRAOCULAR LENS PLACEMENT (IOC);  Surgeon: Birder Robson, MD;  Location: [facility] ORS;  Service: Ophthalmology;  Laterality: Right;  [place] 25.9 AP% 21.9 CDE 5.68 Fluid pack lot # [id number]  . COLON RESECTION  03/04/1991  . COLON SURGERY    . COLONOSCOPY  03-14-10   Dr Bary Castilla, tubular adenoma at 25 cm.  . COLONOSCOPY WITH PROPOFOL N/A 06/07/2015   Procedure: COLONOSCOPY WITH PROPOFOL;  Surgeon: Robert Bellow, MD;  Location: [facility];  Service: Endoscopy;  Laterality: N/A;  . CORONARY STENT INTERVENTION N/A 06/28/2017   Procedure: CORONARY STENT INTERVENTION;  Surgeon: Yolonda Kida, MD;  Location: [facility] CV [facility];  Service: Cardiovascular;  Laterality: N/A;  . HERNIA REPAIR  [id number]   umbilical  . LEFT HEART CATH AND CORONARY ANGIOGRAPHY N/A 06/28/2017   Procedure: LEFT HEART CATH AND CORONARY ANGIOGRAPHY;  Surgeon: Minna Merritts, MD;  Location: [facility];  Service: Cardiovascular;  Laterality: N/A;  .  LUMBAR LAMINECTOMY/DECOMPRESSION MICRODISCECTOMY Bilateral 03/10/2018   Procedure: Laminectomy and Foraminotomy - Lumbar three-Lumbar four - Lumbar four-Lumbar five - bilateral;  Surgeon: Earnie Larsson, MD;  Location: [facility];  Service: Neurosurgery;   Laterality: Bilateral;  . NEPHRECTOMY  1992   left- cancer  . RIB RESECTION     removal 4 ribs  . TONSILLECTOMY      Current Outpatient Medications  Medication Sig Dispense Refill  . aspirin EC 81 MG tablet Take 1 tablet (81 mg total) by mouth daily. 90 tablet 3  . atorvastatin (LIPITOR) 80 MG tablet Take 80 mg by mouth daily.    . carvedilol (COREG) 12.5 MG tablet Take 1.5 tablets (18.75 mg total) by mouth 2 (two) times daily with a meal. 180 tablet 2  . clopidogrel (PLAVIX) 75 MG tablet Take 1 tablet (75 mg total) by mouth daily. 90 tablet 1  . colchicine 0.6 MG tablet Take 1 tablet (0.6 mg total) by mouth daily. Take 1 tablet TID until GI upset or flare subsides, then for maintenance, take 1 tablet daily. 90 tablet 3  . DULoxetine (CYMBALTA) 30 MG capsule     . esomeprazole (NEXIUM) 40 MG capsule Take 40 mg by mouth daily at 3 pm.     . furosemide (LASIX) 20 MG tablet Take 20 mg by mouth every morning.  0  . gabapentin (NEURONTIN) 100 MG capsule     . hydrochlorothiazide (HYDRODIURIL) 25 MG tablet Take 25 mg by mouth daily at 3 pm.     . isosorbide mononitrate (IMDUR) 30 MG 24 hr tablet Take 1 tablet (30 mg total) by mouth daily. 90 tablet 0  . methylPREDNISolone (MEDROL DOSEPAK) 4 MG TBPK tablet 6 day dose pack - take as directed 21 tablet 0  . Multiple Vitamins-Minerals (MULTIVITAMIN WITH MINERALS) tablet Take 1 tablet by mouth daily.    . nitroGLYCERIN (NITROSTAT) 0.4 MG SL tablet Place 1 tablet (0.4 mg total) under the tongue every 5 (five) minutes x 3 doses as needed for chest pain. 20 tablet 1  . traMADol (ULTRAM) 50 MG tablet      No current facility-administered medications for this visit.     Allergies:   Morphine and related and Latex   Social History:  The patient  reports that she has never smoked. She has never used smokeless tobacco. She reports that she does not drink alcohol or use drugs.   Family History:   family history includes Cerebral aneurysm in her father;  Colon cancer in her mother; Heart attack in her father; Heart attack (age of onset: 5) in her brother.    Review of Systems: Review of Systems  Constitutional: Negative.   Respiratory: Negative.   Cardiovascular: Negative.   Gastrointestinal: Negative.   Musculoskeletal: Positive for back pain.  Skin:       bruising  Neurological: Negative.   Psychiatric/Behavioral: Negative.   All other systems reviewed and are negative.   PHYSICAL EXAM: VS:  There were no vitals taken for this visit. , BMI There is no height or weight on file to calculate BMI.  No significant change in exam Constitutional:  oriented to person, place, and time. No distress.  HENT:  Head: Normocephalic and atraumatic.  Eyes:  no discharge. No scleral icterus.  Neck: Normal range of motion. Neck supple. No JVD present.  Cardiovascular: Normal rate, regular rhythm, normal heart sounds and intact distal pulses. Exam reveals no gallop and no friction rub. No edema No murmur heard. Pulmonary/Chest: Effort normal  and breath sounds normal. No stridor. No respiratory distress.  no wheezes.  no rales.  no tenderness.  Abdominal: Soft.  no distension.  no tenderness.  Musculoskeletal: Normal range of motion.  no  tenderness or deformity.  Neurological:  normal muscle tone. Coordination normal. No atrophy Skin: Skin is warm and dry. No rash noted. not diaphoretic.  Psychiatric:  normal mood and affect. behavior is normal. Thought content normal.    Recent Labs: 08/19/2019: ALT 25; BUN 17; Creatinine, Ser 0.86; Hemoglobin 14.4; Magnesium 1.9; Platelets 286; Potassium 3.5; Sodium 141    Lipid Panel Lab Results  Component Value Date   CHOL 172 06/25/2019   HDL 45 06/25/2019   LDLCALC 100 (H) 06/25/2019   TRIG 135 06/25/2019      Wt Readings from Last 3 Encounters:  10/08/19 240 lb (108.9 kg)  08/19/19 241 lb (109.3 kg)  03/15/19 262 lb 5.6 oz (119 kg)       ASSESSMENT AND PLAN:  Non-STEMI (non-ST elevated  myocardial infarction) ([place]) Currently with no symptoms of angina. No further workup at this time. Continue current medication regimen. Stable Long discussion concerning her Plavix and bruising Discussed the data with her, no strong indication to stay on Plavix but would stay on aspirin She has indicated she might take Plavix every other day or half pill daily, nervous to stop it altogether  Mixed hyperlipidemia She is currently on Lipitor 80 Unable to tolerate Zetia, cause diarrhea Repeat lipid panel ordered for next Thursday of August 1  Essential hypertension Blood pressure is well controlled on today's visit. No changes made to the medications. Stable  Morbid obesity Active at baseline, recommended diet modification Walking program  Disposition:   F/U  12 months   Total encounter time more than 25 minutes  Greater than 50% was spent in counseling and coordination of care with the patient    No orders of the defined types were placed in this encounter.    Signed, Esmond Plants, M.D., Ph.D. 11/09/2019  [place], [place]

## 2019-11-10 ENCOUNTER — Ambulatory Visit: Payer: Medicare Other | Admitting: Cardiovascular Disease

## 2019-11-10 DIAGNOSIS — I739 Peripheral vascular disease, unspecified: Secondary | ICD-10-CM

## 2019-11-10 DIAGNOSIS — I25118 Atherosclerotic heart disease of native coronary artery with other forms of angina pectoris: Secondary | ICD-10-CM

## 2019-11-10 DIAGNOSIS — I10 Essential (primary) hypertension: Secondary | ICD-10-CM

## 2019-11-10 DIAGNOSIS — E782 Mixed hyperlipidemia: Secondary | ICD-10-CM

## 2019-11-24 ENCOUNTER — Ambulatory Visit: Payer: Medicare Other

## 2019-12-15 ENCOUNTER — Other Ambulatory Visit: Payer: Self-pay

## 2019-12-15 ENCOUNTER — Ambulatory Visit
Admission: RE | Admit: 2019-12-15 | Discharge: 2019-12-15 | Disposition: A | Discharge status: Home / Self Care | Payer: Medicare Other | Source: Ambulatory Visit | Attending: Urology | Admitting: Urology

## 2019-12-15 DIAGNOSIS — IMO0002 Reserved for concepts with insufficient information to code with codable children: Secondary | ICD-10-CM

## 2019-12-15 DIAGNOSIS — Z85528 Personal history of other malignant neoplasm of kidney: Secondary | ICD-10-CM

## 2019-12-15 DIAGNOSIS — Q600 Renal agenesis, unilateral: Secondary | ICD-10-CM | POA: Insufficient documentation

## 2019-12-15 DIAGNOSIS — Z87442 Personal history of urinary calculi: Secondary | ICD-10-CM

## 2019-12-23 ENCOUNTER — Other Ambulatory Visit: Payer: Self-pay

## 2019-12-23 ENCOUNTER — Ambulatory Visit (INDEPENDENT_AMBULATORY_CARE_PROVIDER_SITE_OTHER): Payer: Medicare Other | Admitting: Podiatry

## 2019-12-23 ENCOUNTER — Encounter: Payer: Self-pay | Admitting: Podiatry

## 2019-12-23 DIAGNOSIS — M109 Gout, unspecified: Secondary | ICD-10-CM

## 2019-12-23 MED ORDER — GABAPENTIN 300 MG PO CAPS: 300.0000 mg | ORAL_CAPSULE | Freq: Three times a day (TID) | ORAL | 3 refills | Status: DC

## 2019-12-23 NOTE — Progress Notes (Signed)
She presents today stating that her gout attack has run its course.  She states that now it feels that she is having burning feet at nighttime.  She states that they does burn all the time and they are painful all the time even walking.  Objective: Vital signs are stable she is alert and oriented x3.  Pulses are palpable.  Neuropathy is noticeable bilateral.  No signs of gout anywhere on the foot.  Assessment: Resolution of gout neuropathy.  Plan: Started on 300 mg of gabapentin 3 times a day follow-up with me in 1 month to discuss changes.

## 2019-12-24 ENCOUNTER — Telehealth: Payer: Self-pay | Admitting: Urology

## 2019-12-24 ENCOUNTER — Telehealth: Payer: Self-pay | Admitting: Cardiovascular Disease

## 2019-12-24 ENCOUNTER — Ambulatory Visit: Payer: Medicare Other | Admitting: Urology

## 2019-12-24 ENCOUNTER — Telehealth (INDEPENDENT_AMBULATORY_CARE_PROVIDER_SITE_OTHER): Payer: Medicare Other | Admitting: Urology

## 2019-12-24 DIAGNOSIS — Z85528 Personal history of other malignant neoplasm of kidney: Secondary | ICD-10-CM | POA: Diagnosis not present

## 2019-12-24 DIAGNOSIS — Z87442 Personal history of urinary calculi: Secondary | ICD-10-CM

## 2019-12-24 NOTE — Telephone Encounter (Signed)
Virtual Visit Pre-Appointment Phone Call  "(Name), I am calling you today to discuss your upcoming appointment. We are currently trying to limit exposure to the virus that causes COVID-19 by seeing patients at home rather than in the office."  1. "What is the BEST phone number to call the day of the visit?" - include this in appointment notes  2. Do you have or have access to (through a family member/friend) a smartphone with video capability that we can use for your visit?" a. If yes - list this number in appt notes as cell (if different from BEST phone #) and list the appointment type as a VIDEO visit in appointment notes b. If no - list the appointment type as a PHONE visit in appointment notes  3. Confirm consent - "In the setting of the current Covid19 crisis, you are scheduled for a (phone or video) visit with your provider on (date) at (time).  Just as we do with many in-office visits, in order for you to participate in this visit, we must obtain consent.  If you'd like, I can send this to your mychart (if signed up) or email for you to review.  Otherwise, I can obtain your verbal consent now.  All virtual visits are billed to your insurance company just like a normal visit would be.  By agreeing to a virtual visit, we'd like you to understand that the technology does not allow for your provider to perform an examination, and thus may limit your provider's ability to fully assess your condition. If your provider identifies any concerns that need to be evaluated in person, we will make arrangements to do so.  Finally, though the technology is pretty good, we cannot assure that it will always work on either your or our end, and in the setting of a video visit, we may have to convert it to a phone-only visit.  In either situation, we cannot ensure that we have a secure connection.  Are you willing to proceed?" STAFF: Did the patient verbally acknowledge consent to telehealth visit? Document  YES/NO here: yes  4. Advise patient to be prepared - "Two hours prior to your appointment, go ahead and check your blood pressure, pulse, oxygen saturation, and your weight (if you have the equipment to check those) and write them all down. When your visit starts, your provider will ask you for this information. If you have an Apple Watch or Kardia device, please plan to have heart rate information ready on the day of your appointment. Please have a pen and paper handy nearby the day of the visit as well."  5. Give patient instructions for MyChart download to smartphone OR Doximity/Doxy.me as below if video visit (depending on what platform provider is using)  6. Inform patient they will receive a phone call 15 minutes prior to their appointment time (may be from unknown caller ID) so they should be prepared to answer    Carrie Larsen has been deemed a candidate for a follow-up tele-health visit to limit community exposure during the Covid-19 pandemic. I spoke with the patient via phone to ensure availability of phone/video source, confirm preferred email & phone number, and discuss instructions and expectations.  I reminded Carrie Larsen to be prepared with any vital sign and/or heart rhythm information that could potentially be obtained via home monitoring, at the time of her visit. I reminded Carrie Larsen to expect a phone call prior to  her visit.  Clarisse Gouge 12/24/2019 2:08 PM   INSTRUCTIONS FOR DOWNLOADING THE MYCHART APP TO SMARTPHONE  - The patient must first make sure to have activated MyChart and know their login information - If Apple, go to [employer] and type in MyChart in the search bar and download the app. If Android, ask patient to go to [employer] and type in [place] in the search bar and download the app. The app is free but as with any other app downloads, their phone may require them to verify saved payment information or Apple/Android  password.  - The patient will need to then log into the app with their MyChart username and password, and select [facility] as their healthcare provider to link the account. When it is time for your visit, go to the MyChart app, find appointments, and click Begin Video Visit. Be sure to Select Allow for your device to access the Microphone and Camera for your visit. You will then be connected, and your provider will be with you shortly.  **If they have any issues connecting, or need assistance please contact MyChart service desk [phone])**  **If using a computer, in order to ensure the best quality for their visit they will need to use either of the following Internet Browsers: [employer], or Google Chrome**  IF USING DOXIMITY or DOXY.ME - The patient will receive a link just prior to their visit by text.     FULL LENGTH CONSENT FOR TELE-HEALTH VISIT   I hereby voluntarily request, consent and authorize [facility] and its employed or contracted physicians, physician assistants, nurse practitioners or other licensed health care professionals (the Practitioner), to provide me with telemedicine health care services (the Services") as deemed necessary by the treating Practitioner. I acknowledge and consent to receive the Services by the Practitioner via telemedicine. I understand that the telemedicine visit will involve communicating with the Practitioner through live audiovisual communication technology and the disclosure of certain medical information by electronic transmission. I acknowledge that I have been given the opportunity to request an in-person assessment or other available alternative prior to the telemedicine visit and am voluntarily participating in the telemedicine visit.  I understand that I have the right to withhold or withdraw my consent to the use of telemedicine in the course of my care at any time, without affecting my right to future care or treatment,  and that the Practitioner or I may terminate the telemedicine visit at any time. I understand that I have the right to inspect all information obtained and/or recorded in the course of the telemedicine visit and may receive copies of available information for a reasonable fee.  I understand that some of the potential risks of receiving the Services via telemedicine include:   Delay or interruption in medical evaluation due to technological equipment failure or disruption;  Information transmitted may not be sufficient (e.g. poor resolution of images) to allow for appropriate medical decision making by the Practitioner; and/or   In rare instances, security protocols could fail, causing a breach of personal health information.  Furthermore, I acknowledge that it is my responsibility to provide information about my medical history, conditions and care that is complete and accurate to the best of my ability. I acknowledge that Practitioner's advice, recommendations, and/or decision may be based on factors not within their control, such as incomplete or inaccurate data provided by me or distortions of diagnostic images or specimens that may result from electronic transmissions. I  understand that the practice of medicine is not an exact science and that Practitioner makes no warranties or guarantees regarding treatment outcomes. I acknowledge that I will receive a copy of this consent concurrently upon execution via email to the email address I last provided but may also request a printed copy by calling the office of [facility].    I understand that my insurance will be billed for this visit.   I have read or had this consent read to me.  I understand the contents of this consent, which adequately explains the benefits and risks of the Services being provided via telemedicine.   I have been provided ample opportunity to ask questions regarding this consent and the Services and have had my questions  answered to my satisfaction.  I give my informed consent for the services to be provided through the use of telemedicine in my medical care  By participating in this telemedicine visit I agree to the above.

## 2019-12-24 NOTE — Telephone Encounter (Signed)
Pt called and wants to know if [facility] would be willing to do a virtual today.  Please advise.  She was on schedule for this afternoon and can't r/s her transportation this soon.

## 2019-12-25 DIAGNOSIS — Z87442 Personal history of urinary calculi: Secondary | ICD-10-CM | POA: Insufficient documentation

## 2019-12-25 NOTE — Progress Notes (Signed)
Virtual Visit via Telephone Note  I connected with Neveen Ulrick on 12/25/19 at 10:15 AM EST by telephone and verified that I am speaking with the correct person using two identifiers.  Location: Patient: Home Provider: Office, [facility]   I discussed the limitations, risks, security and privacy concerns of performing an evaluation and management service by telephone and the availability of in person appointments. I also discussed with the patient that there may be a patient responsible charge related to this service. The patient expressed understanding and agreed to proceed.    History of Present Illness: 74 y.o. female seen last year to establish care after being followed by Dr. Jacqlyn Larsen for several years ago.  Urologic history: -History renal cell carcinoma status post left nephrectomy 1992 -History nephrolithiasis  She states she has been doing fairly well since her visit last year.  She did see Larene Beach in our office November 2020 for dysuria and had a negative urine culture.  Denies flank, abdominal or pelvic pain.  Denies gross hematuria.  Renal ultrasound performed 12/15/2019 shows the right kidney with a stable 3.8 cm inferior pole simple cyst.  No masses, hydronephrosis or calcifications are identified.  The left kidney is surgically absent without mass seen in the nephrectomy bed.  Survey views of the bladder were unremarkable.   Observations/Objective: N/A  Assessment and Plan: -History kidney stones Recent renal ultrasound without recurrent stones  -History of renal cell carcinoma  Follow Up Instructions: Continue annual follow-up   I discussed the assessment and treatment plan with the patient. The patient was provided an opportunity to ask questions and all were answered. The patient agreed with the plan and demonstrated an understanding of the instructions.   The patient was advised to call back or seek an in-person evaluation if the symptoms worsen or if  the condition fails to improve as anticipated.  I provided 10 minutes of non-face-to-face time during this encounter.   Abbie Sons, MD

## 2019-12-29 ENCOUNTER — Ambulatory Visit: Payer: Medicare Other | Admitting: Cardiovascular Disease

## 2020-01-13 ENCOUNTER — Other Ambulatory Visit: Payer: Self-pay

## 2020-01-13 ENCOUNTER — Ambulatory Visit (INDEPENDENT_AMBULATORY_CARE_PROVIDER_SITE_OTHER): Payer: Medicare Other | Admitting: Podiatry

## 2020-01-13 ENCOUNTER — Encounter: Payer: Self-pay | Admitting: Podiatry

## 2020-01-13 DIAGNOSIS — M109 Gout, unspecified: Secondary | ICD-10-CM

## 2020-01-13 MED ORDER — ALLOPURINOL 100 MG PO TABS: 100.0000 mg | ORAL_TABLET | Freq: Two times a day (BID) | ORAL | 1 refills | Status: DC

## 2020-01-13 NOTE — Progress Notes (Signed)
She presents today for follow-up of her bilateral foot she states my left foot is really killing me I think of another gout attack.  Brought her blood work and paperwork from her primary doctor suggesting that we switch her over to allopurinol.  Was not taking the gabapentin because it made her dizzy during the daytime and sleepy so she was not taking it at all but the majority of her pain comes at nighttime she states.  Objective: Vital signs are stable she is alert and oriented x3 she has erythema and edema with warmth on palpation midfoot left.  Right foot minimally so.  Assessment: Diabetic neuropathy with gouty arthritis.  Plan: Recommend that she take 300 mg of gabapentin at bedtime.  We are starting her on allopurinol 100 mg twice daily I will follow-up with her in 1 month for reevaluation and blood work.  At that time we performed uric acid sed rate and a complete metabolic panel.  I also injected her today bilateral dorsum of the foot 20 mg Kenalog 5 mg Marcaine bilateral.

## 2020-01-15 ENCOUNTER — Telehealth: Payer: Self-pay | Admitting: Cardiovascular Disease

## 2020-01-15 NOTE — Telephone Encounter (Signed)
Per pt message. Ok to leave a detailed message on her voicemail. Lmom.  Pt has cortisone injections a couple of days ago. Cortisone can cause elevated BP. - Advised patient to continue to monitor her BP daily for the next 1-2 weeks. - She has checked her BP several times today. She should avoid checking her BP several times a day (anxiousness could also elevate BP). - She should ck her BP 2 hours after BP medications and record them. -She should contact the office with readings, if her BP is consistently elevated, or if symptoms develop.  Advised the patient that an update has been fwd to Dr. Rockey Situ. She should contact the office in the interim if needed.

## 2020-01-15 NOTE — Telephone Encounter (Signed)
Pt c/o BP issue: STAT if pt c/o blurred vision, one-sided weakness or slurred speech  1. What are your last 5 BP readings?   2/19    164/88 HR63  143/82 HR 74  178/87 HR 79    2. Are you having any other symptoms (ex. Dizziness, headache, blurred vision, passed out)? Face burning up  3. What is your BP issue? Extremely high for patient. Patient received cortisone shot Wednesday in both feet at [facility] and woke up today with this BP issue  Please advise patient, states a voicemail can be left

## 2020-01-15 NOTE — Telephone Encounter (Signed)
If BP continues to run high, She could take extra coreg 1/2 dose BID prn (6.25 mg )

## 2020-01-20 ENCOUNTER — Telehealth: Payer: Medicare Other | Admitting: Cardiovascular Disease

## 2020-01-20 NOTE — Telephone Encounter (Signed)
Left voicemail message to call back  

## 2020-01-21 NOTE — Telephone Encounter (Signed)
Spoke with patient and she states that after 3 days from her cortisone shot her blood pressures improved and subsided. She would keep those recommendations in mind if she should have any further problems. She was appreciative for the call back with no further questions at this time.

## 2020-02-15 NOTE — Progress Notes (Signed)
Cardiology Office Note  Date:  02/16/2020   ID:  Carrie Larsen, [place] December 31, 1945, MRN [id number]  PCP:  Carrie Pac, FNP   Chief Complaint  Patient presents with  . other    Follow up from [facility]; chest pain. Meds reviewed by the pt. verbally. 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 SOB with exertion, feels that she has got herself Larsen down  Arthritis pain,  Weight up 20 pounds over the past year  Does nursing care  Stopped HCTZ secondary to gout, Still on lasix daily No significant leg swelling after change in medication Blood pressure stable  Seen in the hospital September 2020 for chest pain Cardiac enzymes negative EKG unchanged Felt to be most likely musculoskeletal  Chronic back pain, had surgery, much better  Denies any chest pain, SOB on exertion  Tolerating Lipitor 80 daily  EKG personally reviewed by myself on todays visit NSR rate 66 BPM, no ST or T wave changes  Other past medical history reviewed Hospital admission August 2018 for non-STEMI Troponin up to 0.88 CT scan with no PE Cardiac catheterization with PCI of OM vessel There is occluded proximal to mid RCA with collaterals from left to right, appeared chronic  Echocardiogram with ejection fraction 55-65%  She reports having an episode of left-sided numbness on her face 11/14/2013. Workup was negative. Including cardiac enzymes, basic metabolic panels, CBC, chest x-ray and urinalysis   PMH:   has a past medical history of CAD (coronary artery disease), Cancer (Carrie Larsen), Colon cancer (Carrie Larsen) (1992), Colon polyp (2011),  COVID-19 ([id number]), Diastolic dysfunction, Essential hypertension, GERD (gastroesophageal reflux disease), Gout, History of kidney stones, Hyperlipidemia, Lumbar spinal stenosis, Myocardial infarction (Carrie Larsen), and Retinal cyst.  PSH:    Past Surgical History:  Procedure Laterality Date  . ABDOMINAL HYSTERECTOMY    . APPENDECTOMY    . CATARACT EXTRACTION W/PHACO Left 04/24/2016   Procedure: CATARACT EXTRACTION PHACO AND INTRAOCULAR LENS PLACEMENT (IOC);  Surgeon: Birder Robson, MD;  Location: [facility] ORS;  Service: Ophthalmology;  Laterality: Left;  [place] 45.8 AP% 16.4 CDE 7.53 FLUID PACK LOT # [id number]  . CATARACT EXTRACTION W/PHACO Right 06/05/2016   Procedure: CATARACT EXTRACTION PHACO AND INTRAOCULAR LENS PLACEMENT (IOC);  Surgeon: Birder Robson, MD;  Location: [facility] ORS;  Service: Ophthalmology;  Laterality: Right;  [place] 25.9 AP% 21.9 CDE 5.68 Fluid pack lot # [id number]  . COLON RESECTION  03/04/1991  . COLON SURGERY    . COLONOSCOPY  03-14-10   Dr Bary Castilla, tubular adenoma at 25 cm.  . COLONOSCOPY WITH PROPOFOL N/A 06/07/2015   Procedure: COLONOSCOPY WITH PROPOFOL;  Surgeon: Robert Bellow, MD;  Location: [facility] ENDOSCOPY;  Service: Endoscopy;  Laterality: N/A;  . CORONARY STENT INTERVENTION N/A 06/28/2017   Procedure: CORONARY STENT INTERVENTION;  Surgeon: Yolonda Kida, MD;  Location: [place];  Service: Cardiovascular;  Laterality: N/A;  . HERNIA REPAIR  [id number]   umbilical  . LEFT HEART CATH AND CORONARY ANGIOGRAPHY N/A 06/28/2017   Procedure: LEFT HEART CATH AND CORONARY ANGIOGRAPHY;  Surgeon: Minna Merritts, MD;  Location: [facility];  Service: Cardiovascular;  Laterality: N/A;  . LUMBAR LAMINECTOMY/DECOMPRESSION MICRODISCECTOMY Bilateral 03/10/2018   Procedure: Laminectomy and Foraminotomy - Lumbar three-Lumbar four - Lumbar four-Lumbar five - bilateral;  Surgeon: Earnie Larsson, MD;  Location: [place];  Service: Neurosurgery;  Laterality: Bilateral;  . NEPHRECTOMY   1992   left- cancer  . RIB RESECTION     removal 4 ribs  . TONSILLECTOMY      Current Outpatient Medications  Medication Sig Dispense Refill  . allopurinol (ZYLOPRIM) 100 MG tablet Take 1 tablet (100 mg total) by mouth 2 (two) times daily. 60 tablet 1  . aspirin EC 81 MG tablet Take 1 tablet (81 mg total) by mouth daily. 90 tablet 3  . atorvastatin (LIPITOR) 80 MG tablet Take 80 mg by mouth daily.    . carvedilol (COREG) 12.5 MG tablet Take 1.5 tablets (18.75 mg total) by mouth 2 (two) times daily with a meal. 180 tablet 2  . clopidogrel (PLAVIX) 75 MG tablet Take 1 tablet (75 mg total) by mouth daily. 90 tablet 1  . colchicine 0.6 MG tablet Take 1 tablet (0.6 mg total) by mouth daily. Take 1 tablet TID until GI upset or flare subsides, then for maintenance, take 1 tablet daily. 90 tablet 3  . esomeprazole (NEXIUM) 40 MG capsule Take 40 mg by mouth daily at 3 pm.     . furosemide (LASIX) 20 MG tablet Take 20 mg by mouth every morning.  0  . hydrochlorothiazide (HYDRODIURIL) 25 MG tablet Take 25 mg by mouth daily at 3 pm.     . isosorbide mononitrate (IMDUR) 30 MG 24 hr tablet Take 1 tablet (30 mg total) by mouth daily. 90 tablet 0  . Multiple Vitamins-Minerals (MULTIVITAMIN WITH MINERALS) tablet Take 1 tablet by mouth daily.    . nitroGLYCERIN (NITROSTAT) 0.4 MG SL tablet Place 1 tablet (0.4 mg total) under the tongue every 5 (five) minutes x 3 doses as needed for chest pain. 20 tablet 1  . traMADol (ULTRAM) 50 MG tablet     . naproxen (NAPROSYN) 375 MG tablet Take 375 mg by mouth 2 (two) times daily as needed.     No current facility-administered medications for this visit.    Allergies:   Morphine and related and Latex   Social History:  The patient  reports that she has never smoked. She has never used smokeless tobacco. She reports that she does not drink alcohol or use drugs.   Family History:   family history includes Cerebral aneurysm in her father; Colon cancer in her mother;  Heart attack in her father; Heart attack (age of onset: 58) in her brother.    Review of Systems: Review of Systems  Constitutional: Negative.   Respiratory: Negative.   Cardiovascular: Negative.   Gastrointestinal: Negative.   Musculoskeletal: Positive for back pain.  Neurological: Negative.   Psychiatric/Behavioral: Negative.   All other systems reviewed and are negative.  PHYSICAL EXAM: VS:  BP 140/80 (BP Location: Left Arm, Patient Position: Sitting, Cuff Size: Normal)   Pulse 66   Ht 5\' 10"  (1.778 m)   Wt 260 lb 8 oz (118.2 kg)   SpO2 97%   BMI 37.38 kg/m  , BMI Body mass index is 37.38 kg/m.  Constitutional:  oriented to person, place, and time. No distress.  HENT:  Head: Grossly normal Eyes:  no discharge. No scleral icterus.  Neck: No JVD, no carotid bruits  Cardiovascular: Regular rate and rhythm, no murmurs appreciated Pulmonary/Chest: Clear to auscultation  bilaterally, no wheezes or rails Abdominal: Soft.  no distension.  no tenderness.  Musculoskeletal: Normal range of motion Neurological:  normal muscle tone. Coordination normal. No atrophy Skin: Skin warm and dry Psychiatric: normal affect, pleasant   Recent Labs: 08/19/2019: ALT 25; BUN 17; Creatinine, Ser 0.86; Hemoglobin 14.4; Magnesium 1.9; Platelets 286; Potassium 3.5; Sodium 141    Lipid Panel Lab Results  Component Value Date   CHOL 172 06/25/2019   HDL 45 06/25/2019   LDLCALC 100 (H) 06/25/2019   TRIG 135 06/25/2019      Wt Readings from Last 3 Encounters:  02/16/20 260 lb 8 oz (118.2 kg)  10/08/19 240 lb (108.9 kg)  08/19/19 241 lb (109.3 kg)      ASSESSMENT AND PLAN:  Non-STEMI (non-ST elevated myocardial infarction) ([place]) Stay on plavix Holding asa Stay on statin No anginal sx  Mixed hyperlipidemia She is currently on Lipitor 80 Unable to tolerate Zetia, cause diarrhea Work on weight loss  Essential hypertension Blood pressure is well controlled on today's visit. No  changes made to the medications.  Morbid obesity We have encouraged continued exercise, careful diet management in an effort to lose weight.  Shortness of breath Recommend she work on her conditioning, walking program at [employer] Weight is up, suggested dietary changes   Disposition:   F/U  12 months   Total encounter time more than 25 minutes  Greater than 50% was spent in counseling and coordination of care with the patient    Orders Placed This Encounter  Procedures  . EKG 12-Lead     Signed, Esmond Plants, M.D., Ph.D. 02/16/2020  [place], [place]

## 2020-02-16 ENCOUNTER — Encounter: Payer: Self-pay | Admitting: Cardiovascular Disease

## 2020-02-16 ENCOUNTER — Ambulatory Visit (INDEPENDENT_AMBULATORY_CARE_PROVIDER_SITE_OTHER): Payer: Medicare Other | Admitting: Cardiovascular Disease

## 2020-02-16 ENCOUNTER — Other Ambulatory Visit: Payer: Self-pay

## 2020-02-16 VITALS — BP 140/80 | HR 66 | Ht 70.0 in | Wt 260.5 lb

## 2020-02-16 DIAGNOSIS — I25118 Atherosclerotic heart disease of native coronary artery with other forms of angina pectoris: Secondary | ICD-10-CM | POA: Diagnosis not present

## 2020-02-16 DIAGNOSIS — I739 Peripheral vascular disease, unspecified: Secondary | ICD-10-CM | POA: Diagnosis not present

## 2020-02-16 DIAGNOSIS — E782 Mixed hyperlipidemia: Secondary | ICD-10-CM

## 2020-02-16 DIAGNOSIS — I10 Essential (primary) hypertension: Secondary | ICD-10-CM

## 2020-02-16 MED ORDER — EZETIMIBE 10 MG PO TABS: 10.0000 mg | ORAL_TABLET | Freq: Every day | ORAL | 3 refills | Status: DC

## 2020-02-16 NOTE — Addendum Note (Signed)
Addended by: Valora Corporal on: 02/16/2020 04:02 PM   Modules accepted: Orders

## 2020-02-16 NOTE — Patient Instructions (Addendum)
Watch the weight Start walking program, walk at [employer]  Medication Instructions:  Zetia 10 mg every other day  Hold asa  If you need a refill on your cardiac medications before your next appointment, please call your pharmacy.   Lab work: No new labs needed   If you have labs (blood work) drawn today and your tests are completely normal, you will receive your results only by: Marland Kitchen MyChart Message (if you have MyChart) OR . A paper copy in the mail If you have any lab test that is abnormal or we need to change your treatment, we will call you to review the results.   Testing/Procedures: No new testing needed   Follow-Up: At [facility], you and your health needs are our priority.  As part of our continuing mission to provide you with exceptional heart care, we have created designated Provider Care Teams.  These Care Teams include your primary Cardiologist (physician) and Advanced Practice Providers (APPs -  Physician Assistants and Nurse Practitioners) who all work together to provide you with the care you need, when you need it.  . You will need a follow up appointment in 12 months .  Marland Kitchen Providers on your designated Care Team:   . Murray Hodgkins, NP . Christell Faith, PA-C . Marrianne Mood, PA-C  Any Other Special Instructions Will Be Listed Below (If Applicable).  For educational health videos Log in to : [email or web address] Or : [email or web address], password : [email or web address]

## 2020-02-17 ENCOUNTER — Ambulatory Visit (INDEPENDENT_AMBULATORY_CARE_PROVIDER_SITE_OTHER): Payer: Medicare Other | Admitting: Podiatry

## 2020-02-17 ENCOUNTER — Encounter: Payer: Self-pay | Admitting: Podiatry

## 2020-02-17 ENCOUNTER — Other Ambulatory Visit: Payer: Self-pay

## 2020-02-17 DIAGNOSIS — I25118 Atherosclerotic heart disease of native coronary artery with other forms of angina pectoris: Secondary | ICD-10-CM | POA: Diagnosis not present

## 2020-02-17 DIAGNOSIS — M109 Gout, unspecified: Secondary | ICD-10-CM

## 2020-02-17 NOTE — Progress Notes (Signed)
She presents today for follow-up of her gouty capsulitis states that my feet are doing great and happy with the allopurinol but are not taking the gabapentin because it makes me feel woozy.  Objective: Vital signs are stable she is alert and oriented x3.  Physical exam does not demonstrate any type of inflammatory event at this point in time I see no signs of gout or inflammation.  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  Plan: Since she just started the allopurinol last time I saw her I like to go ahead and get a complete metabolic panel checked a BUN creatinine AST ALT and also get a uric acid on her to make sure that we are at therapeutic level on the allopurinol.  I will follow-up with her in 6 weeks to 2 months.  Should the blood will come back abnormal we will notify her immediately.  We may need to adjust her allopurinol levels

## 2020-03-04 LAB — CMP14+EGFR
ALT: 21 IU/L (ref 0–32)
AST: 21 IU/L (ref 0–40)
Albumin/Globulin Ratio: 1.8 (ref 1.2–2.2)
Albumin: 4.4 g/dL (ref 3.7–4.7)
Alkaline Phosphatase: 138 IU/L — ABNORMAL HIGH (ref 39–117)
BUN/Creatinine Ratio: 18 (ref 12–28)
BUN: 17 mg/dL (ref 8–27)
Bilirubin Total: 0.4 mg/dL (ref 0.0–1.2)
CO2: 26 mmol/L (ref 20–29)
Calcium: 10.3 mg/dL (ref 8.7–10.3)
Chloride: 104 mmol/L (ref 96–106)
Creatinine, Ser: 0.92 mg/dL (ref 0.57–1.00)
GFR calc Af Amer: 71 mL/min/{1.73_m2} (ref >59)
GFR calc non Af Amer: 62 mL/min/{1.73_m2} (ref >59)
Globulin, Total: 2.4 g/dL (ref 1.5–4.5)
Glucose: 110 mg/dL — ABNORMAL HIGH (ref 65–99)
Potassium: 4.4 mmol/L (ref 3.5–5.2)
Sodium: 143 mmol/L (ref 134–144)
Total Protein: 6.8 g/dL (ref 6.0–8.5)

## 2020-03-04 LAB — URIC ACID: Uric Acid: 7.1 mg/dL (ref 3.1–7.9)

## 2020-03-04 LAB — SEDIMENTATION RATE: Sed Rate: 9 mm/hr (ref 0–40)

## 2020-03-16 ENCOUNTER — Other Ambulatory Visit: Payer: Self-pay | Admitting: Podiatry

## 2020-03-16 MED ORDER — ALLOPURINOL 300 MG PO TABS: 300.0000 mg | ORAL_TABLET | Freq: Every day | ORAL | 6 refills | Status: DC

## 2020-03-16 NOTE — Progress Notes (Unsigned)
a 

## 2020-03-21 ENCOUNTER — Telehealth: Payer: Self-pay

## 2020-03-21 MED ORDER — ALLOPURINOL 300 MG PO TABS: 300.0000 mg | ORAL_TABLET | Freq: Every day | ORAL | 3 refills | Status: DC

## 2020-03-21 NOTE — Telephone Encounter (Signed)
I called patient and informed her of lab results and recommendation per Dr. Milinda Pointer.  She will start 300mg  once daily as directed.  Medication has been sent to pharmacy.

## 2020-03-21 NOTE — Telephone Encounter (Signed)
-----   Message from Garrel Ridgel, [place] sent at 03/16/2020  7:01 AM EDT ----- Let her know that we are increasing her allopurinol.  Her levels are not where we would like to see them so we are increasing to 300mg   per day.  She is currently taking 100mg  TWICE daily.  The new tablet is a 300mg  tablet.  She is to only take it ONCE a day.  Make sure she understands that and we need to see her in one month after she has started the new regimen to get more blood work.

## 2020-03-24 ENCOUNTER — Other Ambulatory Visit: Payer: Self-pay

## 2020-03-24 MED ORDER — CARVEDILOL 12.5 MG PO TABS: 18.7500 mg | ORAL_TABLET | Freq: Two times a day (BID) | ORAL | 2 refills | Status: DC

## 2020-04-06 ENCOUNTER — Other Ambulatory Visit: Payer: Self-pay | Admitting: Podiatry

## 2020-04-07 ENCOUNTER — Telehealth: Payer: Self-pay

## 2020-04-07 MED ORDER — METHYLPREDNISOLONE 4 MG PO TABS: ORAL_TABLET | ORAL | 0 refills | Status: DC

## 2020-04-07 NOTE — Telephone Encounter (Signed)
Patient called stated that she was having a gout attack last week, she took 3 Colchicine on the first and second days of attack, then 1 every day since.  She said she had bad diarrhea for 1-2 days and then went away.  She has been taking 1/2 Colchicine tablet since.  I spoke with Dr. Milinda Pointer and per his verbal order, he wants her to start Medrol dose pack which has been sent to her pharmacy,  Continue taking 1 colchicine daily as long as no diarrhea and continue Allopurinol 300mg  daily.  I have informed patient of instructions via voice mail

## 2020-04-12 DIAGNOSIS — Z0000 Encounter for general adult medical examination without abnormal findings: Secondary | ICD-10-CM | POA: Insufficient documentation

## 2020-04-12 DIAGNOSIS — Z13820 Encounter for screening for osteoporosis: Secondary | ICD-10-CM | POA: Insufficient documentation

## 2020-04-12 DIAGNOSIS — Z9181 History of falling: Secondary | ICD-10-CM | POA: Insufficient documentation

## 2020-04-18 ENCOUNTER — Other Ambulatory Visit: Payer: Self-pay

## 2020-04-18 ENCOUNTER — Ambulatory Visit (INDEPENDENT_AMBULATORY_CARE_PROVIDER_SITE_OTHER): Payer: Medicare Other | Admitting: Podiatry

## 2020-04-18 DIAGNOSIS — M109 Gout, unspecified: Secondary | ICD-10-CM | POA: Diagnosis not present

## 2020-04-18 MED ORDER — METHYLPREDNISOLONE 4 MG PO TBPK: ORAL_TABLET | ORAL | 0 refills | Status: DC

## 2020-04-18 NOTE — Progress Notes (Signed)
She presents today for follow-up of her gout.  States that we got to do something nothing seems to be improving I can even sleep at night.  Objective: Vital signs are stable alert and oriented x3.  Pulses are palpable.  The dorsal aspect of the bilateral foot right greater than left is red and hot to the touch.  Assessment: Chronic gout.  Plan: I am going to start her on 400 mg of allopurinol twice daily.  I am then going to request another uric acid and complete metabolic panel.  Also reinjected the dorsal aspect of the bilateral foot today.  Started on a Medrol Dosepak.  We will refer her to [facility] for rheumatology.  She states that she really does not want to go there however I am hoping they might be able to find a way to get her off the allopurinol.  I still feel that that hydrochlorothiazide is most likely her culprit.

## 2020-04-19 ENCOUNTER — Telehealth: Payer: Self-pay

## 2020-04-19 ENCOUNTER — Other Ambulatory Visit: Payer: Self-pay | Admitting: Family Medicine

## 2020-04-19 DIAGNOSIS — M1A0790 Idiopathic chronic gout, unspecified ankle and foot, without tophus (tophi): Secondary | ICD-10-CM

## 2020-04-19 DIAGNOSIS — Z13820 Encounter for screening for osteoporosis: Secondary | ICD-10-CM

## 2020-04-19 NOTE — Telephone Encounter (Signed)
-----   Message from Rip Harbour, [facility] sent at 04/18/2020  4:20 PM EDT ----- Regarding: Rheumatology Rheumatology consult - chronic gout, taking allopurinol, just increased to 800mg  daily

## 2020-04-19 NOTE — Telephone Encounter (Signed)
Patient called requesting to go to [facility] Rheumatology Dr. Patrecia Pour.  Referral has been placed in chart. Patient has been notified of referral

## 2020-04-28 ENCOUNTER — Other Ambulatory Visit: Payer: Self-pay

## 2020-04-28 DIAGNOSIS — Z1211 Encounter for screening for malignant neoplasm of colon: Secondary | ICD-10-CM

## 2020-05-05 ENCOUNTER — Encounter: Payer: Self-pay | Admitting: Emergency Medicine

## 2020-05-05 ENCOUNTER — Other Ambulatory Visit: Payer: Self-pay

## 2020-05-05 DIAGNOSIS — Z85038 Personal history of other malignant neoplasm of large intestine: Secondary | ICD-10-CM | POA: Insufficient documentation

## 2020-05-05 DIAGNOSIS — I252 Old myocardial infarction: Secondary | ICD-10-CM | POA: Diagnosis not present

## 2020-05-05 DIAGNOSIS — R531 Weakness: Secondary | ICD-10-CM | POA: Insufficient documentation

## 2020-05-05 DIAGNOSIS — Z85528 Personal history of other malignant neoplasm of kidney: Secondary | ICD-10-CM | POA: Diagnosis not present

## 2020-05-05 DIAGNOSIS — I5032 Chronic diastolic (congestive) heart failure: Secondary | ICD-10-CM | POA: Insufficient documentation

## 2020-05-05 DIAGNOSIS — I110 Hypertensive heart disease with heart failure: Secondary | ICD-10-CM | POA: Insufficient documentation

## 2020-05-05 DIAGNOSIS — Z8616 Personal history of COVID-19: Secondary | ICD-10-CM | POA: Insufficient documentation

## 2020-05-05 DIAGNOSIS — I2510 Atherosclerotic heart disease of native coronary artery without angina pectoris: Secondary | ICD-10-CM | POA: Insufficient documentation

## 2020-05-05 DIAGNOSIS — Z79899 Other long term (current) drug therapy: Secondary | ICD-10-CM | POA: Diagnosis not present

## 2020-05-05 DIAGNOSIS — Z7901 Long term (current) use of anticoagulants: Secondary | ICD-10-CM | POA: Insufficient documentation

## 2020-05-05 LAB — CBC WITH DIFFERENTIAL/PLATELET
Abs Immature Granulocytes: 0.03 10*3/uL (ref 0.00–0.07)
Basophils Absolute: 0.1 10*3/uL (ref 0.0–0.1)
Basophils Relative: 1 %
Eosinophils Absolute: 0.1 10*3/uL (ref 0.0–0.5)
Eosinophils Relative: 2 %
HCT: 43.6 % (ref 36.0–46.0)
Hemoglobin: 14.2 g/dL (ref 12.0–15.0)
Immature Granulocytes: 0 %
Lymphocytes Relative: 29 %
Lymphs Abs: 2.7 10*3/uL (ref 0.7–4.0)
MCH: 29.5 pg (ref 26.0–34.0)
MCHC: 32.6 g/dL (ref 30.0–36.0)
MCV: 90.6 fL (ref 80.0–100.0)
Monocytes Absolute: 0.7 10*3/uL (ref 0.1–1.0)
Monocytes Relative: 8 %
Neutro Abs: 5.5 10*3/uL (ref 1.7–7.7)
Neutrophils Relative %: 60 %
Platelets: 287 10*3/uL (ref 150–400)
RBC: 4.81 MIL/uL (ref 3.87–5.11)
RDW: 13.1 % (ref 11.5–15.5)
WBC: 9.2 10*3/uL (ref 4.0–10.5)
nRBC: 0 % (ref 0.0–0.2)

## 2020-05-05 LAB — URINALYSIS, COMPLETE (UACMP) WITH MICROSCOPIC
Bilirubin Urine: NEGATIVE
Glucose, UA: NEGATIVE mg/dL
Hgb urine dipstick: NEGATIVE
Ketones, ur: NEGATIVE mg/dL
Nitrite: NEGATIVE
Protein, ur: NEGATIVE mg/dL
Specific Gravity, Urine: 1.021 (ref 1.005–1.030)
pH: 5 (ref 5.0–8.0)

## 2020-05-05 LAB — COMPREHENSIVE METABOLIC PANEL
ALT: 23 U/L (ref 0–44)
AST: 21 U/L (ref 15–41)
Albumin: 4.1 g/dL (ref 3.5–5.0)
Alkaline Phosphatase: 108 U/L (ref 38–126)
Anion gap: 10 (ref 5–15)
BUN: 23 mg/dL (ref 8–23)
CO2: 27 mmol/L (ref 22–32)
Calcium: 9.6 mg/dL (ref 8.9–10.3)
Chloride: 104 mmol/L (ref 98–111)
Creatinine, Ser: 1 mg/dL (ref 0.44–1.00)
GFR calc Af Amer: >60 mL/min (ref >60)
GFR calc non Af Amer: 55 mL/min — ABNORMAL LOW (ref >60)
Glucose, Bld: 108 mg/dL — ABNORMAL HIGH (ref 70–99)
Potassium: 4.8 mmol/L (ref 3.5–5.1)
Sodium: 141 mmol/L (ref 135–145)
Total Bilirubin: 0.7 mg/dL (ref 0.3–1.2)
Total Protein: 7.2 g/dL (ref 6.5–8.1)

## 2020-05-05 NOTE — ED Notes (Signed)
First nurse note: Pt to the er via ems for HTN, and dizziness. BP 177/100 on truck, the last pressure was 135/101. Hx of MI but 12 lead unremarkable. Takes carvedilol. Pt compliant with meds. BP worse over the last few days with weakness. Nausea with standing.

## 2020-05-05 NOTE — ED Triage Notes (Signed)
Pt to triage via w/c with no distress noted, mask in place, brought in by EMS; st last few days having weakness and HTN

## 2020-05-06 ENCOUNTER — Emergency Department
Admission: EM | Admit: 2020-05-06 | Discharge: 2020-05-06 | Disposition: A | Discharge status: Home / Self Care | Payer: Medicare Other | Attending: Emergency Medicine | Admitting: Emergency Medicine

## 2020-05-06 DIAGNOSIS — I10 Essential (primary) hypertension: Secondary | ICD-10-CM

## 2020-05-06 DIAGNOSIS — R531 Weakness: Secondary | ICD-10-CM

## 2020-05-06 LAB — TROPONIN I (HIGH SENSITIVITY): Troponin I (High Sensitivity): 6 ng/L (ref <18)

## 2020-05-06 NOTE — ED Notes (Signed)
Lab notified at this time of add-on Urine Culture order to previously collected/resulted UA.

## 2020-05-06 NOTE — ED Provider Notes (Signed)
[facility] Emergency Department Provider Note   ____________________________________________   First MD Initiated Contact with Patient 05/06/20 0217     (approximate)  I have reviewed the triage vital signs and the nursing notes.   HISTORY  Chief Complaint Weakness    HPI Carrie Larsen is a 74 y.o. female with medical history of CAD, hypertension, hyperlipidemia, and CHF who presents to the ED complaining of generalized weakness.  Patient reports that she has been feeling increasingly weak over the past couple of days and when she has been checking her blood pressure it has been higher than she is used to.  She denies any fevers, cough, chest pain, shortness of breath, dysuria, hematuria, vomiting, or diarrhea.  She has not had any recent changes in her medications and has been taking her blood pressure medications as prescribed.  She denies any focal weakness, vision or speech changes.        Past Medical History:  Diagnosis Date  . CAD (coronary artery disease)    a. 2012 ETT: no ischemia;  b. 06/2017 NSTEMI/PCI: LM nl, LAD nl, D1 70-80p, LCX nl, OM1 90 (3.0 x 23 Xience [place]), RCA dominant, 100p/m, fills via L->R collats, EF 55-65%.  . Cancer [facility])    a. s/p partial left nephrectomy.  . Colon cancer ([place]) 1992   T3, N1, M0.  . Colon polyp 2011  . COVID-19 07/2019  . Diastolic dysfunction    a. 08/2015 Echo: EF 60-65%, no rwma, Gr1 DD, midlly dil LA, PASP 5mmHg. No significant valvular dzs; b. 06/2017 Echo: EF 60-65%, Gr1DD, mildly dil LA.  . Essential hypertension   . GERD (gastroesophageal reflux disease)   . Gout   . History of kidney stones    passed - 2  . Hyperlipidemia   . Lumbar spinal stenosis   . Myocardial infarction ([place])    06/2017  . Retinal cyst     Patient Active Problem List   Diagnosis Date Noted  . Personal history of kidney stones 12/25/2019  . 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 CME (cystoid macular edema),  left 11/20/2018  . Nonexudative age-related macular degeneration, bilateral, early dry stage 11/20/2018  . Pseudophakia of both eyes 11/20/2018  . Olecranon bursitis 10/16/2018  . Contusion of knee 10/16/2018  . Arthritis of knee 10/16/2018  . Arthritis of hand 10/16/2018  . 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 Morbid obesity ([place]) 09/11/2017  . Non-STEMI (non-ST elevated myocardial infarction) ([place]) 06/27/2017  . Abdominal pain 02/28/2017  . History of renal cell carcinoma 02/20/2017  . Knee pain 08/14/2016  . Strain of hamstring muscle 08/08/2016  . Left leg swelling 08/22/2015  . Systolic murmur   . Retrosternal chest pain 07/08/2015  . History of colonic polyps 04/16/2015  . Chest pain 03/08/2011  . Hyperlipidemia 03/08/2011  . HTN (hypertension) 03/08/2011    Past Surgical History:  Procedure Laterality Date  . ABDOMINAL HYSTERECTOMY    . APPENDECTOMY    . CATARACT EXTRACTION W/PHACO Left 04/24/2016   Procedure: CATARACT EXTRACTION PHACO AND INTRAOCULAR LENS PLACEMENT (IOC);  Surgeon: Birder Robson, MD;  Location: [facility] ORS;  Service: Ophthalmology;  Laterality: Left;  [place] 45.8 AP% 16.4 CDE 7.53 FLUID PACK LOT # [id number]  . CATARACT EXTRACTION W/PHACO Right 06/05/2016   Procedure: CATARACT EXTRACTION PHACO AND INTRAOCULAR LENS PLACEMENT (IOC);  Surgeon: Birder Robson, MD;  Location: [facility] ORS;  Service: Ophthalmology;  Laterality: Right;  [place] 25.9 AP% 21.9 CDE 5.68 Fluid  pack lot # [id number]  . COLON RESECTION  03/04/1991  . COLON SURGERY    . COLONOSCOPY  03-14-10   Dr Bary Castilla, tubular adenoma at 25 cm.  . COLONOSCOPY WITH PROPOFOL N/A 06/07/2015   Procedure: COLONOSCOPY WITH PROPOFOL;  Surgeon: Robert Bellow, MD;  Location: [facility] ENDOSCOPY;  Service: Endoscopy;  Laterality: N/A;  . CORONARY STENT INTERVENTION N/A 06/28/2017   Procedure: CORONARY STENT INTERVENTION;  Surgeon: Yolonda Kida, MD;  Location: [place] CV LAB;  Service:  Cardiovascular;  Laterality: N/A;  . HERNIA REPAIR  1950   umbilical  . LEFT HEART CATH AND CORONARY ANGIOGRAPHY N/A 06/28/2017   Procedure: LEFT HEART CATH AND CORONARY ANGIOGRAPHY;  Surgeon: Minna Merritts, MD;  Location: [place] CV LAB;  Service: Cardiovascular;  Laterality: N/A;  . LUMBAR LAMINECTOMY/DECOMPRESSION MICRODISCECTOMY Bilateral 03/10/2018   Procedure: Laminectomy and Foraminotomy - Lumbar three-Lumbar four - Lumbar four-Lumbar five - bilateral;  Surgeon: Earnie Larsson, MD;  Location: [place];  Service: Neurosurgery;  Laterality: Bilateral;  . NEPHRECTOMY  1992   left- cancer  . RIB RESECTION     removal 4 ribs  . TONSILLECTOMY      Prior to Admission medications   Medication Sig Start Date End Date Taking? Authorizing Provider  allopurinol (ZYLOPRIM) 100 MG tablet Take 1 tablet (100 mg total) by mouth 2 (two) times daily. 01/13/20   Hyatt, Max T, DPM  allopurinol (ZYLOPRIM) 300 MG tablet Take 1 tablet (300 mg total) by mouth daily. 03/16/20   Hyatt, Max T, DPM  allopurinol (ZYLOPRIM) 300 MG tablet Take 1 tablet (300 mg total) by mouth daily. 03/21/20   Hyatt, Max T, DPM  atorvastatin (LIPITOR) 80 MG tablet Take 80 mg by mouth daily.    [provider]  carvedilol (COREG) 12.5 MG tablet Take 1.5 tablets (18.75 mg total) by mouth 2 (two) times daily with a meal. 03/24/20   Theora Gianotti, NP  clopidogrel (PLAVIX) 75 MG tablet Take 1 tablet (75 mg total) by mouth daily. 09/28/19   Minna Merritts, MD  colchicine 0.6 MG tablet Take 1 tablet (0.6 mg total) by mouth daily. Take 1 tablet TID until GI upset or flare subsides, then for maintenance, take 1 tablet daily. 07/22/19   Hyatt, Max T, DPM  esomeprazole (NEXIUM) 40 MG capsule Take 40 mg by mouth daily at 3 pm.     [provider]  ezetimibe (ZETIA) 10 MG tablet Take 1 tablet (10 mg total) by mouth daily. 02/16/20 05/16/20  Minna Merritts, MD  furosemide (LASIX) 20 MG tablet Take 20 mg by mouth every  morning. 05/06/17   [provider]  hydrochlorothiazide (HYDRODIURIL) 25 MG tablet Take 25 mg by mouth daily at 3 pm.  04/15/16   [provider]  isosorbide mononitrate (IMDUR) 30 MG 24 hr tablet Take 1 tablet (30 mg total) by mouth daily. 07/24/17   Minna Merritts, MD  methylPREDNISolone (MEDROL DOSEPAK) 4 MG TBPK tablet 6 day dose pack - take as directed 04/18/20   Hyatt, Max T, DPM  Multiple Vitamins-Minerals (MULTIVITAMIN WITH MINERALS) tablet Take 1 tablet by mouth daily.    [provider]  naproxen (NAPROSYN) 375 MG tablet Take 375 mg by mouth 2 (two) times daily as needed. 11/13/19   [provider]  nitroGLYCERIN (NITROSTAT) 0.4 MG SL tablet Place 1 tablet (0.4 mg total) under the tongue every 5 (five) minutes x 3 doses as needed for chest pain. 01/12/19  Minna Merritts, MD  traMADol Veatrice Bourbon) 50 MG tablet  05/18/19   [provider]    Allergies Morphine and related and Latex  Family History  Problem Relation Age of Onset  . Colon cancer Mother   . Cerebral aneurysm Father   . Heart attack Father   . Heart attack Brother 90    Social History Social History   Tobacco Use  . Smoking status: Never Smoker  . Smokeless tobacco: Never Used  Vaping Use  . Vaping Use: Never used  Substance Use Topics  . Alcohol use: No  . Drug use: No    Review of Systems  Constitutional: No fever/chills.  Positive for generalized weakness. Eyes: No visual changes. ENT: No sore throat. Cardiovascular: Denies chest pain. Respiratory: Denies shortness of breath. Gastrointestinal: No abdominal pain.  No nausea, no vomiting.  No diarrhea.  No constipation. Genitourinary: Negative for dysuria. Musculoskeletal: Negative for back pain. Skin: Negative for rash. Neurological: Negative for headaches, focal weakness or numbness.  ____________________________________________   PHYSICAL EXAM:  VITAL SIGNS: ED Triage Vitals  Enc Vitals Group      BP 05/05/20 2310 (!) 154/80     Pulse Rate 05/05/20 2310 65     Resp 05/05/20 2310 18     Temp 05/05/20 2310 98.3 F (36.8 C)     Temp Source 05/05/20 2310 Oral     SpO2 05/05/20 2310 97 %     Weight 05/05/20 2309 255 lb (115.7 kg)     Height 05/05/20 2309 5\' 10"  (1.778 m)     Head Circumference --      Peak Flow --      Pain Score 05/05/20 2309 0     Pain Loc --      Pain Edu? --      Excl. in [place]? --     Constitutional: Alert and oriented. Eyes: Conjunctivae are normal. Head: Atraumatic. Nose: No congestion/rhinnorhea. Mouth/Throat: Mucous membranes are moist. Neck: Normal ROM Cardiovascular: Normal rate, regular rhythm. Grossly normal heart sounds. Respiratory: Normal respiratory effort.  No retractions. Lungs CTAB. Gastrointestinal: Soft and nontender. No distention. Genitourinary: deferred Musculoskeletal: No lower extremity tenderness nor edema. Neurologic:  Normal speech and language. No gross focal neurologic deficits are appreciated. Skin:  Skin is warm, dry and intact. No rash noted. Psychiatric: Mood and affect are normal. Speech and behavior are normal.  ____________________________________________   LABS (all labs ordered are listed, but only abnormal results are displayed)  Labs Reviewed  COMPREHENSIVE METABOLIC PANEL - Abnormal; Notable for the following components:      Result Value   Glucose, Bld 108 (*)    GFR calc non Af Amer 55 (*)    All other components within normal limits  URINALYSIS, COMPLETE (UACMP) WITH MICROSCOPIC - Abnormal; Notable for the following components:   Color, Urine YELLOW (*)    APPearance CLEAR (*)    Leukocytes,Ua SMALL (*)    Bacteria, UA RARE (*)    All other components within normal limits  URINE CULTURE  CBC WITH DIFFERENTIAL/PLATELET  TROPONIN I (HIGH SENSITIVITY)   ____________________________________________  EKG  ED ECG REPORT I, Blake Divine, the attending physician, personally viewed and interpreted this  ECG.   Date: 05/06/2020  EKG Time: 23:18  Rate: 66  Rhythm: normal sinus rhythm  Axis: Normal  Intervals:Incomplete RBBB  ST&T Change: Inferior Q waves similar to prior, no acute changes   PROCEDURES  Procedure(s) performed (including Critical Care):  Procedures   ____________________________________________  INITIAL IMPRESSION / ASSESSMENT AND PLAN / ED COURSE       74 year old female with history of CAD, hypertension, hyperlipidemia, and CHF presents to the ED complaining of 3 to 4 days of generalized weakness along with fluctuations in her blood pressure.  She has no focal weakness on exam and I doubt stroke.  UA is borderline for UTI, but she denies any urinary symptoms, we will send for culture but hold off on antibiotics.  EKG shows no evidence of arrhythmia or ischemia, Q waves similar to prior and troponin within normal limits.  Remainder of lab work is unremarkable and her blood pressure seems to have normalized here in the ED.  I would hold off on any changes to her blood pressure medications, there is no evidence of hypertensive emergency.  She was counseled to follow-up with her PCP for recheck of her blood pressure and otherwise return to the ED for new worsening symptoms.  Patient agrees with plan.      ____________________________________________   FINAL CLINICAL IMPRESSION(S) / ED DIAGNOSES  Final diagnoses:  Generalized weakness  Essential hypertension     ED Discharge Orders    None       Note:  This document was prepared using Dragon voice recognition software and may include unintentional dictation errors.   Blake Divine, MD 05/06/20 [phone]

## 2020-05-07 LAB — URINE CULTURE: Culture: 30000 — AB

## 2020-05-17 LAB — COMPREHENSIVE METABOLIC PANEL
ALT: 24 IU/L (ref 0–32)
AST: 19 IU/L (ref 0–40)
Albumin/Globulin Ratio: 1.8 (ref 1.2–2.2)
Albumin: 4.4 g/dL (ref 3.7–4.7)
Alkaline Phosphatase: 142 IU/L — ABNORMAL HIGH (ref 48–121)
BUN/Creatinine Ratio: 19 (ref 12–28)
BUN: 19 mg/dL (ref 8–27)
Bilirubin Total: 0.4 mg/dL (ref 0.0–1.2)
CO2: 23 mmol/L (ref 20–29)
Calcium: 9.7 mg/dL (ref 8.7–10.3)
Chloride: 107 mmol/L — ABNORMAL HIGH (ref 96–106)
Creatinine, Ser: 0.98 mg/dL (ref 0.57–1.00)
GFR calc Af Amer: 66 mL/min/{1.73_m2} (ref >59)
GFR calc non Af Amer: 57 mL/min/{1.73_m2} — ABNORMAL LOW (ref >59)
Globulin, Total: 2.5 g/dL (ref 1.5–4.5)
Glucose: 132 mg/dL — ABNORMAL HIGH (ref 65–99)
Potassium: 4.4 mmol/L (ref 3.5–5.2)
Sodium: 142 mmol/L (ref 134–144)
Total Protein: 6.9 g/dL (ref 6.0–8.5)

## 2020-05-17 LAB — URIC ACID: Uric Acid: 6.5 mg/dL (ref 3.1–7.9)

## 2020-05-18 ENCOUNTER — Ambulatory Visit (INDEPENDENT_AMBULATORY_CARE_PROVIDER_SITE_OTHER): Payer: Medicare Other | Admitting: Podiatry

## 2020-05-18 ENCOUNTER — Other Ambulatory Visit: Payer: Self-pay

## 2020-05-18 ENCOUNTER — Encounter: Payer: Self-pay | Admitting: Podiatry

## 2020-05-18 DIAGNOSIS — M1A0790 Idiopathic chronic gout, unspecified ankle and foot, without tophus (tophi): Secondary | ICD-10-CM

## 2020-05-18 NOTE — Progress Notes (Signed)
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 I informed her to call us if she has any problems or flare ups. She is scheduled with Dr. Estanislado Pandy on 08/17/20.

## 2020-05-24 ENCOUNTER — Telehealth: Payer: Self-pay | Admitting: Cardiovascular Disease

## 2020-05-24 NOTE — Telephone Encounter (Addendum)
Spoke with patient she is very tearful stating that her blood pressures are elevated and provided readings below. 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 She states it is very stressful and she worries when she gets home and is unable to get any rest. Inquired if she has a psychiatrist that she uses and reviewed that it may be a good idea so she can get some help with her anxiety. Instructed her to try taking a nitroglycerin in the morning if her blood pressures remain elevated then take her morning medications and then check her blood pressures in 2 hours. Instructed her to go to ED if her blood pressures persistently remain elevated and also discussed how anxiety and stress can make this worse. Scheduled her to see provider on Tuesday here in the office and she confirmed date, time, and location. She verbalized understanding of our conversation, agreement with plan, and had no further questions at this time.   199/109 09:30 182/99 10:30 173/95 12:00 159/84 1:30 149/81 3:00 156/80 5:00 161/90

## 2020-05-24 NOTE — Telephone Encounter (Signed)
Recommend increasing Imdur to 60mg  daily to assist with elevated blood pressures. She may take two of her 30mg  tablets together to get a dose of 60mg . Most common side effect is headache so please educate her to call our office if she notes headache.   Recommend sitting and resting 10 minutes before checking blood pressure and checking at least 1 hour after medications.   Loel Dubonnet, NP

## 2020-05-24 NOTE — Telephone Encounter (Signed)
PAtient calling in because she is still waiting for a call back. Patient states her BP is 149/81 now and she is still very concered  Please advise patient

## 2020-05-24 NOTE — Telephone Encounter (Signed)
Pt c/o BP issue: STAT if pt c/o blurred vision, one-sided weakness or slurred speech  1. What are your last 5 BP readings?  An hour ago 199/109 HR 67 After medications, just now 182/99 HR 64  2. Are you having any other symptoms (ex. Dizziness, headache, blurred vision, passed out)? No other symptoms  3. What is your BP issue? Elevated

## 2020-05-25 MED ORDER — ISOSORBIDE MONONITRATE ER 30 MG PO TB24: 30.0000 mg | ORAL_TABLET | Freq: Two times a day (BID) | ORAL | 3 refills | Status: DC

## 2020-05-25 NOTE — Telephone Encounter (Signed)
Spoke with patient and reviewed provider recommendations to increase isosorbide mononitrate to 60 mg daily. She did inquire about taking it twice a day to decrease chance of headaches and advised that would be fine. She as appreciative for the call with no further questions at this time.

## 2020-05-31 ENCOUNTER — Ambulatory Visit: Payer: Medicare Other | Admitting: Family

## 2020-06-14 ENCOUNTER — Other Ambulatory Visit: Payer: Self-pay | Admitting: Family Medicine

## 2020-06-14 ENCOUNTER — Other Ambulatory Visit: Payer: Self-pay | Admitting: General Surgery

## 2020-06-14 DIAGNOSIS — Z1231 Encounter for screening mammogram for malignant neoplasm of breast: Secondary | ICD-10-CM

## 2020-06-27 ENCOUNTER — Other Ambulatory Visit: Payer: Self-pay

## 2020-06-27 ENCOUNTER — Ambulatory Visit (INDEPENDENT_AMBULATORY_CARE_PROVIDER_SITE_OTHER): Payer: Medicare Other | Admitting: Podiatry

## 2020-06-27 ENCOUNTER — Encounter: Payer: Self-pay | Admitting: Podiatry

## 2020-06-27 DIAGNOSIS — M1A0790 Idiopathic chronic gout, unspecified ankle and foot, without tophus (tophi): Secondary | ICD-10-CM

## 2020-06-27 DIAGNOSIS — I25118 Atherosclerotic heart disease of native coronary artery with other forms of angina pectoris: Secondary | ICD-10-CM

## 2020-06-27 DIAGNOSIS — L603 Nail dystrophy: Secondary | ICD-10-CM

## 2020-06-27 NOTE — Progress Notes (Signed)
She presents today states that she is doing great so far has not had a gout attack no flareups recently she states that she was bit by her cat just the other day but it seems to be healing very nicely no problems with that she denies fever chills nausea vomiting muscle aches pains.  She denies any problems taking 400 mg of allopurinol.  Objective: Vital signs are stable she is alert oriented x3 currently no flareups all of her joints full range of motion without crepitation.  She does have hammertoe deformities.  Toenails are long thick yellow dystrophic possibly mycotic.  Assessment: Chronic gout currently controlled with allopurinol.  Nail dystrophy.  Cannot rule out onychomycosis.  Plan: Discussed etiology pathology conservative versus surgical therapies.  At this point went ahead and took samples of the nails to be sent for pathologic evaluation also requested a complete metabolic panel and a uric acid level for evaluation.  Should her blood work come back abnormal we will notify her immediately.

## 2020-07-01 LAB — COMPREHENSIVE METABOLIC PANEL
ALT: 22 IU/L (ref 0–32)
AST: 18 IU/L (ref 0–40)
Albumin/Globulin Ratio: 2 (ref 1.2–2.2)
Albumin: 4.5 g/dL (ref 3.7–4.7)
Alkaline Phosphatase: 148 IU/L — ABNORMAL HIGH (ref 48–121)
BUN/Creatinine Ratio: 19 (ref 12–28)
BUN: 17 mg/dL (ref 8–27)
Bilirubin Total: 0.4 mg/dL (ref 0.0–1.2)
CO2: 24 mmol/L (ref 20–29)
Calcium: 9.8 mg/dL (ref 8.7–10.3)
Chloride: 106 mmol/L (ref 96–106)
Creatinine, Ser: 0.88 mg/dL (ref 0.57–1.00)
GFR calc Af Amer: 75 mL/min/{1.73_m2} (ref >59)
GFR calc non Af Amer: 65 mL/min/{1.73_m2} (ref >59)
Globulin, Total: 2.3 g/dL (ref 1.5–4.5)
Glucose: 113 mg/dL — ABNORMAL HIGH (ref 65–99)
Potassium: 4.6 mmol/L (ref 3.5–5.2)
Sodium: 143 mmol/L (ref 134–144)
Total Protein: 6.8 g/dL (ref 6.0–8.5)

## 2020-07-01 LAB — URIC ACID: Uric Acid: 6.2 mg/dL (ref 3.1–7.9)

## 2020-07-05 ENCOUNTER — Telehealth: Payer: Self-pay

## 2020-07-05 NOTE — Telephone Encounter (Signed)
-----   Message from Garrel Ridgel, [place] sent at 07/01/2020  4:27 PM EDT ----- You can let her know that her blood work looks good.

## 2020-07-05 NOTE — Telephone Encounter (Signed)
Patient notified of results via voice mail °

## 2020-07-06 ENCOUNTER — Telehealth: Payer: Self-pay

## 2020-07-06 MED ORDER — METHYLPREDNISOLONE 4 MG PO TABS
ORAL_TABLET | ORAL | 0 refills | Status: DC
Start: 2020-07-06 — End: 2020-07-14

## 2020-07-06 NOTE — Telephone Encounter (Signed)
Patient called stated that she is having a flare up of gout on both feet and toes which started about 3am this morning.  She said she has started taking her Colchicine and has been taking Allopurinol.  She wanted to know what else she could take to help relieve pain.   I spoke with Dr. Milinda Pointer and per his verbal order, script for Medrol dose pack has been sent to pharmacy.  Patient was informed of medrol dose pack and instructed to continue with Colchicine and Allopurinol and if no better in the next couple of days to call back    She verbalized instructions

## 2020-07-14 ENCOUNTER — Other Ambulatory Visit: Payer: Self-pay

## 2020-07-14 ENCOUNTER — Telehealth (INDEPENDENT_AMBULATORY_CARE_PROVIDER_SITE_OTHER): Payer: Medicare Other | Admitting: Nurse Practitioner

## 2020-07-14 ENCOUNTER — Encounter: Payer: Self-pay | Admitting: Nurse Practitioner

## 2020-07-14 VITALS — BP 169/70 | HR 66 | Ht 70.0 in | Wt 260.0 lb

## 2020-07-14 DIAGNOSIS — I2510 Atherosclerotic heart disease of native coronary artery without angina pectoris: Secondary | ICD-10-CM | POA: Diagnosis not present

## 2020-07-14 DIAGNOSIS — I10 Essential (primary) hypertension: Secondary | ICD-10-CM

## 2020-07-14 DIAGNOSIS — E785 Hyperlipidemia, unspecified: Secondary | ICD-10-CM

## 2020-07-14 NOTE — Progress Notes (Signed)
Virtual Visit via Telephone Note   This visit type was conducted due to national recommendations for restrictions regarding the COVID-19 Pandemic (e.g. social distancing) in an effort to limit this patient's exposure and mitigate transmission in our community.  Due to her co-morbid illnesses, this patient is at least at moderate risk for complications without adequate follow up.  This format is felt to be most appropriate for this patient at this time.  The patient did not have access to video technology/had technical difficulties with video requiring transitioning to audio format only (telephone).  All issues noted in this document were discussed and addressed.  No physical exam could be performed with this format.  Please refer to the patient's chart for her  consent to telehealth for [facility]. Evaluation Performed:  Follow-up visit  This visit type was conducted due to national recommendations for restrictions regarding the COVID-19 Pandemic (e.g. social distancing).  This format is felt to be most appropriate for this patient at this time.  All issues noted in this document were discussed and addressed.  No physical exam was performed (except for noted visual exam findings with Video Visits).  Please refer to the patient's chart (MyChart message for video visits and phone note for telephone visits) for the patient's consent to telehealth for [facility]. _____________   Date:  07/14/2020   Patient ID:  Carrie Larsen, DOB Feb 15, 1946, MRN [id number] Patient Location:  Home Provider location:   Office  Primary Care Provider:  Gennette Pac, FNP Primary Cardiologist:  Ida Rogue, MD  Chief Complaint    74 year old female with history of hypertension, hyperlipidemia, renal cell cancer status post partial left nephrectomy, colon cancer, GERD, obesity, and CAD status post non-STEMI and OM1 stenting in August 2018, who presents for virtual follow-up related to hypertension.  Past  Medical History    Past Medical History:  Diagnosis Date   CAD (coronary artery disease)    a. 2012 ETT: no ischemia;  b. 06/2017 NSTEMI/PCI: LM nl, LAD nl, D1 70-80p, LCX nl, OM1 90 (3.0 x 23 Xience Alpine), RCA dominant, 100p/m, fills via L->R collats, EF 55-65%.   Cancer [facility])    a. s/p partial left nephrectomy.   Colon cancer ([place]) 1992   T3, N1, M0.   Colon polyp 2011   COVID-19 14/4818   Diastolic dysfunction    a. 08/2015 Echo: EF 60-65%, no rwma, Gr1 DD, midlly dil LA, PASP 69mmHg. No significant valvular dzs; b. 06/2017 Echo: EF 60-65%, Gr1DD, mildly dil LA.   Essential hypertension    GERD (gastroesophageal reflux disease)    Gout    History of kidney stones    passed - 2   Hyperlipidemia    Lumbar spinal stenosis    Myocardial infarction ([place])    06/2017   Retinal cyst    Past Surgical History:  Procedure Laterality Date   ABDOMINAL HYSTERECTOMY     APPENDECTOMY     CATARACT EXTRACTION W/PHACO Left 04/24/2016   Procedure: CATARACT EXTRACTION PHACO AND INTRAOCULAR LENS PLACEMENT ([place]);  Surgeon: Birder Robson, MD;  Location: [facility] ORS;  Service: Ophthalmology;  Laterality: Left;  [place] 45.8 AP% 16.4 CDE 7.53 FLUID PACK LOT # [id number]   CATARACT EXTRACTION W/PHACO Right 06/05/2016   Procedure: CATARACT EXTRACTION PHACO AND INTRAOCULAR LENS PLACEMENT ([facility]);  Surgeon: Birder Robson, MD;  Location: [facility] ORS;  Service: Ophthalmology;  Laterality: Right;  [place] 25.9 AP% 21.9 CDE 5.68 Fluid pack lot # [id number]   COLON RESECTION  03/04/1991  COLON SURGERY     COLONOSCOPY  03-14-10   Dr Bary Castilla, tubular adenoma at 25 cm.   COLONOSCOPY WITH PROPOFOL N/A 06/07/2015   Procedure: COLONOSCOPY WITH PROPOFOL;  Surgeon: Robert Bellow, MD;  Location: [facility] ENDOSCOPY;  Service: Endoscopy;  Laterality: N/A;   CORONARY STENT INTERVENTION N/A 06/28/2017   Procedure: CORONARY STENT INTERVENTION;  Surgeon: Yolonda Kida, MD;  Location: [place] CV LAB;   Service: Cardiovascular;  Laterality: N/A;   HERNIA REPAIR  2426   umbilical   LEFT HEART CATH AND CORONARY ANGIOGRAPHY N/A 06/28/2017   Procedure: LEFT HEART CATH AND CORONARY ANGIOGRAPHY;  Surgeon: Minna Merritts, MD;  Location: [place] CV LAB;  Service: Cardiovascular;  Laterality: N/A;   LUMBAR LAMINECTOMY/DECOMPRESSION MICRODISCECTOMY Bilateral 03/10/2018   Procedure: Laminectomy and Foraminotomy - Lumbar three-Lumbar four - Lumbar four-Lumbar five - bilateral;  Surgeon: Earnie Larsson, MD;  Location: [place];  Service: Neurosurgery;  Laterality: Bilateral;   NEPHRECTOMY  1992   left- cancer   RIB RESECTION     removal 4 ribs   TONSILLECTOMY      Allergies  Allergies  Allergen Reactions   Morphine And Related Nausea Only   Latex Rash    History of Present Illness    Carrie Larsen is a 74 y.o. female who presents via audio/video conferencing for a telehealth visit today.  As above, she has a history of hypertension, hyperlipidemia, renal cancer status post partial left nephrectomy, colon cancer, GERD, spinal stenosis, and CAD status post non-STEMI in August 2018.  Catheterization at that time showed severe OM1 disease with chronic total occlusion of the RCA.  The OM1 was successfully treated with a drug-eluting stent.  She has done reasonably well since her non-STEMI and was last seen in cardiology clinic in March, at which time she was asymptomatic.  She recently contacted our office in late June in the setting of elevated blood pressures.  She was advised to increase her isosorbide mononitrate to 30 mg twice daily.  She notes that since doing so, blood pressures have been much better controlled and typically in the 130s.  Her blood pressure is elevated today but she notes that she had a very upsetting event occur at work yesterday and it still been bothering her today.  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  She takes care of an  elderly schizophrenic female and female suffering from dementia.  She denies chest pain, dyspnea, palpitations, PND, orthopnea, dizziness, syncope, edema, or early satiety.  She is due to receive her second Covid vaccination in the next few weeks and wears a mask at work and in public.  The patient does not have symptoms concerning for COVID-19 infection (fever, chills, cough, or new shortness of breath).   Home Medications    Prior to Admission medications   Medication Sig Start Date End Date Taking? Authorizing Provider  allopurinol (ZYLOPRIM) 100 MG tablet Take 1 tablet (100 mg total) by mouth 2 (two) times daily. 01/13/20  Yes Hyatt, Max T, DPM  atorvastatin (LIPITOR) 80 MG tablet Take 80 mg by mouth daily.   Yes [provider]  carvedilol (COREG) 12.5 MG tablet Take 1.5 tablets (18.75 mg total) by mouth 2 (two) times daily with a meal. 03/24/20  Yes Theora Gianotti, NP  clopidogrel (PLAVIX) 75 MG tablet Take 1 tablet (75 mg total) by mouth daily. 09/28/19  Yes Minna Merritts, MD  colchicine 0.6 MG  tablet Take 1 tablet (0.6 mg total) by mouth daily. Take 1 tablet TID until GI upset or flare subsides, then for maintenance, take 1 tablet daily. 07/22/19  Yes Hyatt, Max T, DPM  esomeprazole (NEXIUM) 40 MG capsule Take 40 mg by mouth daily at 3 pm.    Yes [provider]  isosorbide mononitrate (IMDUR) 30 MG 24 hr tablet Take 1 tablet (30 mg total) by mouth in the morning and at bedtime. 05/25/20  Yes Loel Dubonnet, NP  Multiple Vitamins-Minerals (MULTIVITAMIN WITH MINERALS) tablet Take 1 tablet by mouth daily.   Yes [provider]  nitroGLYCERIN (NITROSTAT) 0.4 MG SL tablet Place 1 tablet (0.4 mg total) under the tongue every 5 (five) minutes x 3 doses as needed for chest pain. 01/12/19  Yes Gollan, Kathlene November, MD  traMADol (ULTRAM) 50 MG tablet Take 50 mg by mouth daily.  05/18/19  Yes [provider]    Review of Systems    She denies chest pain,  palpitations, dyspnea, pnd, orthopnea, n, v, dizziness, syncope, edema, weight gain, or early satiety.  All other systems reviewed and are otherwise negative except as noted above.  Physical Exam    Vital Signs:  BP (!) 169/70    Pulse 66    Ht 5\' 10"  (1.778 m)    Wt 260 lb (117.9 kg)    BMI 37.31 kg/m    Pleasant female no acute distress.  Awake alert and oriented x3.  Speech is unlabored.  Accessory Clinical Findings    Lab Results  Component Value Date   WBC 9.2 05/05/2020   HGB 14.2 05/05/2020   HCT 43.6 05/05/2020   MCV 90.6 05/05/2020   PLT 287 05/05/2020   Lab Results  Component Value Date   CREATININE 0.88 06/30/2020   BUN 17 06/30/2020   NA 143 06/30/2020   K 4.6 06/30/2020   CL 106 06/30/2020   CO2 24 06/30/2020   Lab Results  Component Value Date   CHOL 172 06/25/2019   HDL 45 06/25/2019   LDLCALC 100 (H) 06/25/2019   TRIG 135 06/25/2019   CHOLHDL 3.8 06/25/2019   Lab Results  Component Value Date   ALT 22 06/30/2020   AST 18 06/30/2020   ALKPHOS 148 (H) 06/30/2020   BILITOT 0.4 06/30/2020   Lab Results  Component Value Date   TSH 2.592 10/22/2016   Lab Results  Component Value Date   HGBA1C 5.8 (H) 06/27/2017     Assessment & Plan    1.  Coronary artery disease: Status post non-STEMI 2015 with drug-eluting stent placement to the first obtuse marginal and finding of chronic total occlusion of the RCA.  She has been doing well from a cardiac standpoint remains reasonably active without symptoms or limitations.  She remains on statin, beta-blocker, Plavix, and nitrate therapy.  2.  Essential hypertension: Blood pressures recently trending higher at the end of June and her isosorbide mononitrate dose was increased to 30 mg twice daily.  She says that since then, pressures are now trending in the 130s.  Pressure is higher today, which she attributes to a stressful event occurring at work yesterday, which is continued to bother her today.  She will  continue to trend blood pressures at home and I will not make any changes today.  Continue current doses of carvedilol and isosorbide.  If pressures trend higher going forward, we could always consider low-dose amlodipine versus losartan, though it is notable that potassium trends on  the high end of normal.  3.  Hyperlipidemia: LDL was 100 last July.  She remains on statin therapy.  She did not tolerate Zetia secondary to diarrhea.  Could consider PCSK9 inhibitor therapy if she is interested in the future.  4.  Disposition: Follow-up in clinic in 6 months or sooner if necessary.   COVID-19 Education: The signs and symptoms of COVID-19 were discussed with the patient and how to seek care for testing (follow up with PCP or arrange E-visit).  The importance of social distancing was discussed today.  Patient Risk:   After full review of this patient's history and clinical status, I feel that he is at least moderate risk for cardiac complications at this time, thus necessitating a telehealth visit sooner than our first available in office visit.  Time:   Today, I have spent 15 minutes with the patient with telehealth technology discussing medical history, symptoms, and management plan.     Murray Hodgkins, NP 07/14/2020, 12:29 PM

## 2020-07-14 NOTE — Patient Instructions (Signed)
Medication Instructions:  Your physician recommends that you continue on your current medications as directed. Please refer to the Current Medication list given to you today.  *If you need a refill on your cardiac medications before your next appointment, please call your pharmacy*   Lab Work: None ordered  If you have labs (blood work) drawn today and your tests are completely normal, you will receive your results only by: . MyChart Message (if you have MyChart) OR . A paper copy in the mail If you have any lab test that is abnormal or we need to change your treatment, we will call you to review the results.   Testing/Procedures: None ordered    Follow-Up: At [facility], you and your health needs are our priority.  As part of our continuing mission to provide you with exceptional heart care, we have created designated Provider Care Teams.  These Care Teams include your primary Cardiologist (physician) and Advanced Practice Providers (APPs -  Physician Assistants and Nurse Practitioners) who all work together to provide you with the care you need, when you need it.  We recommend signing up for the patient portal called "MyChart".  Sign up information is provided on this After Visit Summary.  MyChart is used to connect with patients for Virtual Visits (Telemedicine).  Patients are able to view lab/test results, encounter notes, upcoming appointments, etc.  Non-urgent messages can be sent to your provider as well.   To learn more about what you can do with MyChart, go to [email or web address].    Your next appointment:   6 month(s)  The format for your next appointment:   In Person  Provider:    You may see Timothy Gollan, MD or Christopher Berge, NP 

## 2020-07-21 ENCOUNTER — Inpatient Hospital Stay: Admission: RE | Admit: 2020-07-21 | Payer: Medicare Other | Source: Ambulatory Visit

## 2020-08-03 ENCOUNTER — Other Ambulatory Visit: Payer: Self-pay

## 2020-08-03 ENCOUNTER — Ambulatory Visit (INDEPENDENT_AMBULATORY_CARE_PROVIDER_SITE_OTHER): Payer: Medicare Other | Admitting: Podiatry

## 2020-08-03 ENCOUNTER — Encounter: Payer: Medicare Other | Admitting: Podiatry

## 2020-08-03 DIAGNOSIS — G5793 Unspecified mononeuropathy of bilateral lower limbs: Secondary | ICD-10-CM

## 2020-08-03 DIAGNOSIS — I2510 Atherosclerotic heart disease of native coronary artery without angina pectoris: Secondary | ICD-10-CM

## 2020-08-03 DIAGNOSIS — M1A0790 Idiopathic chronic gout, unspecified ankle and foot, without tophus (tophi): Secondary | ICD-10-CM

## 2020-08-03 MED ORDER — PREGABALIN 75 MG PO CAPS
75.0000 mg | ORAL_CAPSULE | Freq: Two times a day (BID) | ORAL | 3 refills | Status: DC
Start: 2020-08-03 — End: 2021-02-06

## 2020-08-03 NOTE — Progress Notes (Deleted)
Office Visit Note  Patient: Carrie Larsen             Date of Birth: 1946-10-30           MRN: [id number]             PCP: Gennette Pac, [place] Referring: Garrel Ridgel, DPM Visit Date: 08/17/2020 Occupation: @GUAROCC @  Subjective:  No chief complaint on file.   History of Present Illness: Carrie Larsen is a 74 y.o. female ***   Activities of Daily Living:  Patient reports morning stiffness for *** {minute/hour:19697}.   Patient {ACTIONS;DENIES/REPORTS:21021675::"Denies"} nocturnal pain.  Difficulty dressing/grooming: {ACTIONS;DENIES/REPORTS:21021675::"Denies"} Difficulty climbing stairs: {ACTIONS;DENIES/REPORTS:21021675::"Denies"} Difficulty getting out of chair: {ACTIONS;DENIES/REPORTS:21021675::"Denies"} Difficulty using hands for taps, buttons, cutlery, and/or writing: {ACTIONS;DENIES/REPORTS:21021675::"Denies"}  No Rheumatology ROS completed.   PMFS History:  Patient Active Problem List   Diagnosis Date Noted   Personal history of kidney stones 12/25/2019   Choroidal nevus, 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   CME (cystoid macular edema), left 11/20/2018   Nonexudative age-related macular degeneration, bilateral, early dry stage 11/20/2018   Pseudophakia of both eyes 11/20/2018   Olecranon bursitis 10/16/2018   Contusion of knee 10/16/2018   Arthritis of knee 10/16/2018   Arthritis of hand 10/16/2018   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/10/2018   Morbid obesity ([place]) 09/11/2017   Non-STEMI (non-ST elevated myocardial infarction) ([place]) 06/27/2017   Abdominal pain 02/28/2017   History of renal cell carcinoma 02/20/2017   Knee pain 08/14/2016   Strain of hamstring muscle 08/08/2016   Left leg swelling 01/74/9449   Systolic murmur    Retrosternal chest pain 07/08/2015   History of colonic polyps 04/16/2015   Chest pain 03/08/2011   Hyperlipidemia 03/08/2011   HTN (hypertension) 03/08/2011    Past Medical History:  Diagnosis Date   CAD  (coronary artery disease)    a. 2012 ETT: no ischemia;  b. 06/2017 NSTEMI/PCI: LM nl, LAD nl, D1 70-80p, LCX nl, OM1 90 (3.0 x 23 Xience [place]), RCA dominant, 100p/m, fills via L->R collats, EF 55-65%.   Cancer [facility])    a. s/p partial left nephrectomy.   Colon cancer ([place]) 1992   T3, N1, M0.   Colon polyp 2011   COVID-19 67/5916   Diastolic dysfunction    a. 08/2015 Echo: EF 60-65%, no rwma, Gr1 DD, midlly dil LA, PASP 102mmHg. No significant valvular dzs; b. 06/2017 Echo: EF 60-65%, Gr1DD, mildly dil LA.   Essential hypertension    GERD (gastroesophageal reflux disease)    Gout    History of kidney stones    passed - 2   Hyperlipidemia    Lumbar spinal stenosis    Myocardial infarction [facility])    06/2017   Retinal cyst     Family History  Problem Relation Age of Onset   Colon cancer Mother    Cerebral aneurysm Father    Heart attack Father    Heart attack Brother 2   Past Surgical History:  Procedure Laterality Date   ABDOMINAL HYSTERECTOMY     APPENDECTOMY     CATARACT EXTRACTION W/PHACO Left 04/24/2016   Procedure: CATARACT EXTRACTION PHACO AND INTRAOCULAR LENS PLACEMENT ([place]);  Surgeon: Birder Robson, MD;  Location: [facility] ORS;  Service: Ophthalmology;  Laterality: Left;  [place] 45.8 AP% 16.4 CDE 7.53 FLUID PACK LOT # [id number]   CATARACT EXTRACTION W/PHACO Right 06/05/2016   Procedure: CATARACT EXTRACTION PHACO AND INTRAOCULAR LENS PLACEMENT ([facility]);  Surgeon: Birder Robson, MD;  Location: [facility] ORS;  Service: Ophthalmology;  Laterality: Right;  [place] 25.9 AP% 21.9 CDE 5.68 Fluid pack lot # [id number]   COLON RESECTION  03/04/1991   COLON SURGERY     COLONOSCOPY  03-14-10   Dr Bary Castilla, tubular adenoma at 25 cm.   COLONOSCOPY WITH PROPOFOL N/A 06/07/2015   Procedure: COLONOSCOPY WITH PROPOFOL;  Surgeon: Robert Bellow, MD;  Location: [facility] ENDOSCOPY;  Service: Endoscopy;  Laterality: N/A;   CORONARY STENT INTERVENTION N/A 06/28/2017   Procedure: CORONARY  STENT INTERVENTION;  Surgeon: Yolonda Kida, MD;  Location: [place] CV LAB;  Service: Cardiovascular;  Laterality: N/A;   HERNIA REPAIR  1655   umbilical   LEFT HEART CATH AND CORONARY ANGIOGRAPHY N/A 06/28/2017   Procedure: LEFT HEART CATH AND CORONARY ANGIOGRAPHY;  Surgeon: Minna Merritts, MD;  Location: [place] CV LAB;  Service: Cardiovascular;  Laterality: N/A;   LUMBAR LAMINECTOMY/DECOMPRESSION MICRODISCECTOMY Bilateral 03/10/2018   Procedure: Laminectomy and Foraminotomy - Lumbar three-Lumbar four - Lumbar four-Lumbar five - bilateral;  Surgeon: Earnie Larsson, MD;  Location: [place];  Service: Neurosurgery;  Laterality: Bilateral;   NEPHRECTOMY  1992   left- cancer   RIB RESECTION     removal 4 ribs   TONSILLECTOMY     Social History   Social History Narrative   Not on file   Immunization History  Administered Date(s) Administered   Influenza, High Dose Seasonal PF 09/20/2018   Pneumococcal Polysaccharide-23 06/29/2017     Objective: Vital Signs: There were no vitals taken for this visit.   Physical Exam   Musculoskeletal Exam: ***  CDAI Exam: CDAI Score: -- Patient Global: --; Provider Global: -- Swollen: --; Tender: -- Joint Exam 08/17/2020   No joint exam has been documented for this visit   There is currently no information documented on the homunculus. Go to the Rheumatology activity and complete the homunculus joint exam.  Investigation: No additional findings.  Imaging: No results found.  Recent Labs: Lab Results  Component Value Date   WBC 9.2 05/05/2020   HGB 14.2 05/05/2020   PLT 287 05/05/2020   NA 143 06/30/2020   K 4.6 06/30/2020   CL 106 06/30/2020   CO2 24 06/30/2020   GLUCOSE 113 (H) 06/30/2020   BUN 17 06/30/2020   CREATININE 0.88 06/30/2020   BILITOT 0.4 06/30/2020   ALKPHOS 148 (H) 06/30/2020   AST 18 06/30/2020   ALT 22 06/30/2020   PROT 6.8 06/30/2020   ALBUMIN 4.5 06/30/2020   CALCIUM 9.8 06/30/2020    GFRAA 75 06/30/2020    Speciality Comments: No specialty comments available.  Procedures:  No procedures performed Allergies: Morphine and related and Latex   Assessment / Plan:     Visit Diagnoses: Idiopathic chronic gout of multiple sites without tophus - Allopurinol 100 mg 1 tablet 2 times daily, colchicine 0.6 mg 1 tablet daily. uric acid 6.2 on 06/30/20  Personal history of kidney stones  Non-STEMI (non-ST elevated myocardial infarction) ([place])  Essential hypertension  Systolic murmur  Pseudophakia of both eyes  Nonexudative age-related macular degeneration, bilateral, early dry stage  Lumbar stenosis with neurogenic claudication  History of renal cell carcinoma  History of colonic polyps  History of hyperlipidemia  Orders: No orders of the defined types were placed in this encounter.  No orders of the defined types were placed in this encounter.   Face-to-face time spent with patient was *** minutes. Greater than 50% of time was spent in counseling and coordination of care.  Follow-Up Instructions: No follow-ups  on file.   Ofilia Neas, PA-C  Note - This record has been created using Dragon software.  Chart creation errors have been sought, but may not always  have been located. Such creation errors do not reflect on  the standard of medical care.

## 2020-08-03 NOTE — Progress Notes (Signed)
Carrie Larsen presents today stating that her neuropathy is just killing her.  Objective: Vital signs are stable she is alert and oriented x3 pulses are palpable.  Currently no gout attacks she has pain in her toes extending to the metatarsophalangeal joints but really do not worsen with examination.  Decreased sensation per Thornell Mule monofilament.  Assessment: Neuropathy.  Plan: This point we will start her back on Lyrica 75 mg 1 p.o. twice daily she will take it at night first for about a week and that and that 1 in the morning.

## 2020-08-09 ENCOUNTER — Other Ambulatory Visit: Payer: Self-pay

## 2020-08-09 ENCOUNTER — Other Ambulatory Visit
Admission: RE | Admit: 2020-08-09 | Discharge: 2020-08-09 | Disposition: A | Discharge status: Home / Self Care | Payer: Medicare Other | Source: Ambulatory Visit | Attending: General Surgery | Admitting: General Surgery

## 2020-08-09 ENCOUNTER — Encounter: Payer: Self-pay | Admitting: General Surgery

## 2020-08-09 DIAGNOSIS — Z20822 Contact with and (suspected) exposure to covid-19: Secondary | ICD-10-CM | POA: Insufficient documentation

## 2020-08-09 DIAGNOSIS — Z01812 Encounter for preprocedural laboratory examination: Secondary | ICD-10-CM | POA: Diagnosis present

## 2020-08-09 NOTE — H&P (Signed)
Patient ID: Carrie Larsen is a 74 y.o. female.  HPI  The following portions of the patient's history were reviewed and updated as appropriate.  This an established patient is here today for: office visit. Here today having a colonoscopy, last completed in 2016. Bowels move daily, no bleeding.  Review of Systems  Constitutional: Negative for chills and fever.  Respiratory: Negative for cough.          Chief Complaint  Patient presents with  . Pre-op Exam    discuss colonoscopy     BP 131/56   Pulse 57   Temp 36.5 C (97.7 F)   Ht 177.8 cm (5\' 10" )   Wt (!) 122.5 kg (270 lb)   SpO2 95%   BMI 38.74 kg/m       Past Medical History:  Diagnosis Date  . Anxiety, generalized   . Chicken pox   . CME (cystoid macular edema), left 11/20/2018  . Colon cancer (CMS-HCC) 1992   T3,N1,M0  . Colon polyp 2011  . GERD (gastroesophageal reflux disease)   . History of kidney cancer   . History of kidney cancer 11/20/2018   S/p REMOVAL OF ONE KIDNEY  1991  . Hyperlipidemia   . Hypertension   . Nonexudative age-related macular degeneration, bilateral, early dry stage 11/20/2018  . Obesity (BMI 30-39.9), unspecified   . Osteoarthritis   . Prediabetes   . Pseudophakia of both eyes 11/20/2018   ~ 2016          Past Surgical History:  Procedure Laterality Date  . APPENDECTOMY    . COLONOSCOPY  06/07/2015  . HYSTERECTOMY    . INJECTION INTRAVITREAL PHARMACOLOGIC AGENT Left 2019   KENALOG ([facility] - Dr. Zigmund Daniel)  . LAPAROSCOPIC COLON RESECTION    . LENS EYE SURGERY Bilateral 2016   CE AND PCIOL OU  . REMOVAL OF LEFT KIDNEY Left    KIDNEY CANCER  . REPAIR OF HERNIA     AT POINT OF COLON RESECTION  . SPIN SX  2019   FOR PINCHED NERVE             OB History    Gravida  1   Para  1   Term      Preterm      AB      Living        SAB      TAB      Ectopic      Molar      Multiple      Live Births           Obstetric Comments  Age at first period 33 Age of first pregnancy 76 Age of menopause 37        Social History          Socioeconomic History  . Marital status: Single    Spouse name: Not on file  . Number of children: Not on file  . Years of education: Not on file  . Highest education level: Not on file  Occupational History  . Not on file  Tobacco Use  . Smoking status: Never Smoker  . Smokeless tobacco: Never Used  . Tobacco comment: AND NO EXPOSURE IN CHILDHOOD HOME  Substance and Sexual Activity  . Alcohol use: No  . Drug use: No  . Sexual activity: Never    Partners: Male  Other Topics Concern  . Not on file  Social History Narrative  . Not  on file   Social Determinants of Health      Financial Resource Strain:   . Difficulty of Paying Living Expenses:   Food Insecurity:   . Worried About Charity fundraiser in the Last Year:   . Arboriculturist in the Last Year:   Transportation Needs:   . Film/video editor (Medical):   Marland Kitchen Lack of Transportation (Non-Medical):            Allergies  Allergen Reactions  . Amoxicillin Unknown  . Mevacor [Lovastatin] Unknown  . Morphine Unknown  . Prednisone Nausea and Vomiting  . Latex Rash    Current Medications        Current Outpatient Medications  Medication Sig Dispense Refill  . aspirin 81 MG EC tablet Take by mouth.    Marland Kitchen atorvastatin (LIPITOR) 80 MG tablet Take by mouth.    . carvedilol (COREG) 12.5 MG tablet Take by mouth.    . clopidogrel (PLAVIX) 75 mg tablet Take by mouth    . colchicine (COLCRYS) 0.6 mg tablet Take by mouth as needed       . esomeprazole (NEXIUM) 40 MG DR capsule Take 40 mg by mouth once daily.    Marland Kitchen lidocaine (LIDODERM) 5 % patch Place 1 patch onto the skin daily Apply patch to the most painful area for up to 12 hours in a 24 hour period.    . multivitamin with minerals tablet Take by mouth.    . nitroGLYcerin (NITROSTAT) 0.4 MG SL tablet  Place under the tongue.    . polyethylene glycol (MIRALAX) powder One bottle for colonoscopy prep. Use as directed. 255 g 0   No current facility-administered medications for this visit.           Family History  Problem Relation Age of Onset  . Colon cancer Mother   . High blood pressure (Hypertension) Mother   . Heart disease Father   . High blood pressure (Hypertension) Father   . Heart disease Sister   . Blindness Neg Hx   . Glaucoma Neg Hx   . Macular degeneration Neg Hx   . Diabetes Neg Hx   . Breast cancer Neg Hx     Labs and Radiology:        Chief Complaint  Patient presents with  . Pre-op Exam    discuss colonoscopy     BP 131/56   Pulse 57   Temp 36.5 C (97.7 F)   Ht 177.8 cm (5\' 10" )   Wt (!) 122.5 kg (270 lb)   SpO2 95%   BMI 38.74 kg/m       Past Medical History:  Diagnosis Date  . Anxiety, generalized   . Chicken pox   . CME (cystoid macular edema), left 11/20/2018  . Colon cancer (CMS-HCC) 1992   T3,N1,M0  . Colon polyp 2011  . GERD (gastroesophageal reflux disease)   . History of kidney cancer   . History of kidney cancer 11/20/2018   S/p REMOVAL OF ONE KIDNEY  1991  . Hyperlipidemia   . Hypertension   . Nonexudative age-related macular degeneration, bilateral, early dry stage 11/20/2018  . Obesity (BMI 30-39.9), unspecified   . Osteoarthritis   . Prediabetes   . Pseudophakia of both eyes 11/20/2018   ~ 2016          Past Surgical History:  Procedure Laterality Date  . APPENDECTOMY    . COLONOSCOPY  06/07/2015  . HYSTERECTOMY    .  INJECTION INTRAVITREAL PHARMACOLOGIC AGENT Left 2019   KENALOG ([facility] - Dr. Zigmund Daniel)  . LAPAROSCOPIC COLON RESECTION    . LENS EYE SURGERY Bilateral 2016   CE AND PCIOL OU  . REMOVAL OF LEFT KIDNEY Left    KIDNEY CANCER  . REPAIR OF HERNIA     AT POINT OF COLON RESECTION  . SPIN SX  2019   FOR PINCHED NERVE             OB History     Gravida  1   Para  1   Term      Preterm      AB      Living        SAB      TAB      Ectopic      Molar      Multiple      Live Births          Obstetric Comments  Age at first period 69 Age of first pregnancy 64 Age of menopause 89        Social History          Socioeconomic History  . Marital status: Single    Spouse name: Not on file  . Number of children: Not on file  . Years of education: Not on file  . Highest education level: Not on file  Occupational History  . Not on file  Tobacco Use  . Smoking status: Never Smoker  . Smokeless tobacco: Never Used  . Tobacco comment: AND NO EXPOSURE IN CHILDHOOD HOME  Substance and Sexual Activity  . Alcohol use: No  . Drug use: No  . Sexual activity: Never    Partners: Male  Other Topics Concern  . Not on file  Social History Narrative  . Not on file   Social Determinants of Health   Financial Resource Strain:   . Difficulty of Paying Living Expenses:   Food Insecurity:   . Worried About Charity fundraiser in the Last Year:   . Arboriculturist in the Last Year:   Transportation Needs:   . Film/video editor (Medical):   Marland Kitchen Lack of Transportation (Non-Medical):            Allergies  Allergen Reactions  . Amoxicillin Unknown  . Mevacor [Lovastatin] Unknown  . Morphine Unknown  . Prednisone Nausea and Vomiting  . Latex Rash    Current Medications        Current Outpatient Medications  Medication Sig Dispense Refill  . aspirin 81 MG EC tablet Take by mouth.    Marland Kitchen atorvastatin (LIPITOR) 80 MG tablet Take by mouth.    . carvedilol (COREG) 12.5 MG tablet Take by mouth.    . clopidogrel (PLAVIX) 75 mg tablet Take by mouth    . colchicine (COLCRYS) 0.6 mg tablet Take by mouth as needed       . esomeprazole (NEXIUM) 40 MG DR capsule Take 40 mg by mouth once daily.    Marland Kitchen lidocaine (LIDODERM) 5 % patch Place 1 patch onto the skin daily Apply patch to the most  painful area for up to 12 hours in a 24 hour period.    . multivitamin with minerals tablet Take by mouth.    . nitroGLYcerin (NITROSTAT) 0.4 MG SL tablet Place under the tongue.    . polyethylene glycol (MIRALAX) powder One bottle for colonoscopy prep. Use as directed. 255 g 0   No current  facility-administered medications for this visit.           Family History  Problem Relation Age of Onset  . Colon cancer Mother   . High blood pressure (Hypertension) Mother   . Heart disease Father   . High blood pressure (Hypertension) Father   . Heart disease Sister   . Blindness Neg Hx   . Glaucoma Neg Hx   . Macular degeneration Neg Hx   . Diabetes Neg Hx   . Breast cancer Neg Hx     Labs and Radiology:       Objective:   Physical Exam Exam conducted with a chaperone present.  Constitutional:      Appearance: Normal appearance.  Cardiovascular:     Rate and Rhythm: Normal rate and regular rhythm.     Pulses: Normal pulses.     Heart sounds: Normal heart sounds.  Pulmonary:     Effort: Pulmonary effort is normal.     Breath sounds: Normal breath sounds.  Musculoskeletal:     Cervical back: Neck supple.  Skin:    General: Skin is warm and dry.  Neurological:     Mental Status: She is alert and oriented to person, place, and time.  Psychiatric:        Mood and Affect: Mood normal.        Behavior: Behavior normal.        Assessment:     Candidate for repeat colonoscopy.    Plan:     The patient was instructed in regards to colonoscopy preparation.  She is well familiar with the risks and benefits.  She will hold her Plavix for 5 days prior to the procedure.  Entered by Ledell Noss, CMA, acting as a scribe for Dr. Hervey Ard, MD.   The documentation recorded by the scribe accurately reflects the service I personally performed and the decisions made by me.   Robert Bellow, MD FACS

## 2020-08-10 ENCOUNTER — Encounter
Admission: RE | Disposition: A | Discharge status: Home / Self Care | Payer: Self-pay | Source: Home / Self Care | Attending: General Surgery

## 2020-08-10 ENCOUNTER — Ambulatory Visit: Payer: Medicare Other | Admitting: Certified Registered Nurse Anesthetist

## 2020-08-10 ENCOUNTER — Other Ambulatory Visit: Payer: Self-pay

## 2020-08-10 ENCOUNTER — Ambulatory Visit
Admission: RE | Admit: 2020-08-10 | Discharge: 2020-08-10 | Disposition: A | Discharge status: Home / Self Care | Payer: Medicare Other | Attending: General Surgery | Admitting: General Surgery

## 2020-08-10 ENCOUNTER — Encounter: Payer: Self-pay | Admitting: General Surgery

## 2020-08-10 DIAGNOSIS — D128 Benign neoplasm of rectum: Secondary | ICD-10-CM | POA: Insufficient documentation

## 2020-08-10 DIAGNOSIS — Z85038 Personal history of other malignant neoplasm of large intestine: Secondary | ICD-10-CM | POA: Insufficient documentation

## 2020-08-10 DIAGNOSIS — M1990 Unspecified osteoarthritis, unspecified site: Secondary | ICD-10-CM | POA: Diagnosis not present

## 2020-08-10 DIAGNOSIS — Z7902 Long term (current) use of antithrombotics/antiplatelets: Secondary | ICD-10-CM | POA: Diagnosis not present

## 2020-08-10 DIAGNOSIS — Z7982 Long term (current) use of aspirin: Secondary | ICD-10-CM | POA: Diagnosis not present

## 2020-08-10 DIAGNOSIS — F411 Generalized anxiety disorder: Secondary | ICD-10-CM | POA: Insufficient documentation

## 2020-08-10 DIAGNOSIS — I2510 Atherosclerotic heart disease of native coronary artery without angina pectoris: Secondary | ICD-10-CM | POA: Insufficient documentation

## 2020-08-10 DIAGNOSIS — E669 Obesity, unspecified: Secondary | ICD-10-CM | POA: Diagnosis not present

## 2020-08-10 DIAGNOSIS — E785 Hyperlipidemia, unspecified: Secondary | ICD-10-CM | POA: Diagnosis not present

## 2020-08-10 DIAGNOSIS — Z1211 Encounter for screening for malignant neoplasm of colon: Secondary | ICD-10-CM | POA: Diagnosis present

## 2020-08-10 DIAGNOSIS — Z79899 Other long term (current) drug therapy: Secondary | ICD-10-CM | POA: Insufficient documentation

## 2020-08-10 DIAGNOSIS — Z6830 Body mass index (BMI) 30.0-30.9, adult: Secondary | ICD-10-CM | POA: Diagnosis not present

## 2020-08-10 DIAGNOSIS — Z8616 Personal history of COVID-19: Secondary | ICD-10-CM | POA: Insufficient documentation

## 2020-08-10 DIAGNOSIS — K219 Gastro-esophageal reflux disease without esophagitis: Secondary | ICD-10-CM | POA: Diagnosis not present

## 2020-08-10 DIAGNOSIS — I10 Essential (primary) hypertension: Secondary | ICD-10-CM | POA: Diagnosis not present

## 2020-08-10 DIAGNOSIS — D122 Benign neoplasm of ascending colon: Secondary | ICD-10-CM | POA: Insufficient documentation

## 2020-08-10 HISTORY — DX: Anxiety disorder, unspecified: F41.9

## 2020-08-10 HISTORY — DX: Presence of intraocular lens: Z96.1

## 2020-08-10 HISTORY — DX: Varicella without complication: B01.9

## 2020-08-10 HISTORY — DX: Obesity, unspecified: E66.9

## 2020-08-10 HISTORY — DX: Unspecified osteoarthritis, unspecified site: M19.90

## 2020-08-10 HISTORY — PX: COLONOSCOPY WITH PROPOFOL: SHX5780

## 2020-08-10 HISTORY — DX: Prediabetes: R73.03

## 2020-08-10 LAB — SARS CORONAVIRUS 2 (TAT 6-24 HRS): SARS Coronavirus 2: NEGATIVE

## 2020-08-10 SURGERY — COLONOSCOPY WITH PROPOFOL
Anesthesia: General

## 2020-08-10 MED ORDER — LIDOCAINE HCL (CARDIAC) PF 100 MG/5ML IV SOSY
PREFILLED_SYRINGE | INTRAVENOUS | Status: DC | PRN
  Administered 2020-08-10: 50 mg via INTRAVENOUS

## 2020-08-10 MED ORDER — PROPOFOL 500 MG/50ML IV EMUL
INTRAVENOUS | Status: AC
  Filled 2020-08-10: qty 50

## 2020-08-10 MED ORDER — CLOPIDOGREL BISULFATE 75 MG PO TABS: 75.0000 mg | ORAL_TABLET | Freq: Every day | ORAL | 1 refills | Status: DC

## 2020-08-10 MED ORDER — PROPOFOL 500 MG/50ML IV EMUL
INTRAVENOUS | Status: DC | PRN
  Administered 2020-08-10: 125 ug/kg/min via INTRAVENOUS

## 2020-08-10 MED ORDER — PHENYLEPHRINE HCL (PRESSORS) 10 MG/ML IV SOLN
INTRAVENOUS | Status: AC
  Filled 2020-08-10: qty 1

## 2020-08-10 MED ORDER — SODIUM CHLORIDE 0.9 % IV SOLN: INTRAVENOUS | Status: DC

## 2020-08-10 MED ORDER — PROPOFOL 10 MG/ML IV BOLUS
INTRAVENOUS | Status: DC | PRN
  Administered 2020-08-10: 50 mg via INTRAVENOUS
  Administered 2020-08-10: 30 mg via INTRAVENOUS

## 2020-08-10 MED ORDER — GLYCOPYRROLATE 0.2 MG/ML IJ SOLN
INTRAMUSCULAR | Status: AC
  Filled 2020-08-10: qty 1

## 2020-08-10 MED ORDER — LIDOCAINE HCL (PF) 2 % IJ SOLN
INTRAMUSCULAR | Status: AC
  Filled 2020-08-10: qty 5

## 2020-08-10 MED ORDER — PROPOFOL 10 MG/ML IV BOLUS
INTRAVENOUS | Status: AC
  Filled 2020-08-10: qty 20

## 2020-08-10 MED ORDER — PHENYLEPHRINE HCL (PRESSORS) 10 MG/ML IV SOLN
INTRAVENOUS | Status: DC | PRN
  Administered 2020-08-10 (×2): 50 ug via INTRAVENOUS

## 2020-08-10 NOTE — H&P (Signed)
Carrie Larsen [id number] 11/27/1945     HPI:  Colon cancer 30 years ago.  For screening colonoscopy.   Medications Prior to Admission  Medication Sig Dispense Refill Last Dose  . aspirin EC 81 MG tablet Take 81 mg by mouth daily. Swallow whole.   08/09/2020 at 0800  . atorvastatin (LIPITOR) 80 MG tablet Take 80 mg by mouth daily.   08/09/2020 at 1800  . carvedilol (COREG) 12.5 MG tablet Take 1.5 tablets (18.75 mg total) by mouth 2 (two) times daily with a meal. 270 tablet 2 08/10/2020 at 0600  . clopidogrel (PLAVIX) 75 MG tablet Take 1 tablet (75 mg total) by mouth daily. 90 tablet 1 08/04/2020 at Unknown time  . colchicine 0.6 MG tablet Take 1 tablet (0.6 mg total) by mouth daily. Take 1 tablet TID until GI upset or flare subsides, then for maintenance, take 1 tablet daily. 90 tablet 3 Past Month at Unknown time  . esomeprazole (NEXIUM) 40 MG capsule Take 40 mg by mouth daily at 3 pm.    08/09/2020 at 0800  . isosorbide mononitrate (IMDUR) 30 MG 24 hr tablet Take 1 tablet (30 mg total) by mouth in the morning and at bedtime. 180 tablet 3 08/09/2020 at 0800  . lidocaine (LIDODERM) 5 % Place 1 patch onto the skin daily. Remove & Discard patch within 12 hours or as directed by MD   Past Week at Unknown time  . Multiple Vitamins-Minerals (MULTIVITAMIN WITH MINERALS) tablet Take 1 tablet by mouth daily.   08/09/2020 at 0800  . nitroGLYCERIN (NITROSTAT) 0.4 MG SL tablet Place 1 tablet (0.4 mg total) under the tongue every 5 (five) minutes x 3 doses as needed for chest pain. 20 tablet 1 Past Month at Unknown time  . traMADol (ULTRAM) 50 MG tablet Take 50 mg by mouth daily.    Past Week at Unknown time  . allopurinol (ZYLOPRIM) 100 MG tablet Take 1 tablet (100 mg total) by mouth 2 (two) times daily. (Patient not taking: Reported on 08/10/2020) 60 tablet 1 Not Taking at Unknown time  . pregabalin (LYRICA) 75 MG capsule Take 1 capsule (75 mg total) by mouth 2 (two) times daily. 60 capsule 3    Allergies   Allergen Reactions  . Morphine And Related Nausea Only  . Prednisone Nausea And Vomiting  . Latex Rash   Past Medical History:  Diagnosis Date  . Anxiety   . Arthritis    osteoarthritis  . CAD (coronary artery disease)    a. 2012 ETT: no ischemia;  b. 06/2017 NSTEMI/PCI: LM nl, LAD nl, D1 70-80p, LCX nl, OM1 90 (3.0 x 23 Xience [place]), RCA dominant, 100p/m, fills via L->R collats, EF 55-65%.  . Cancer [facility])    a. s/p partial left nephrectomy.  . Chicken pox   . CME (cystoid macular edema), left 11/20/2018  . Colon cancer ([place]) 1992   T3, N1, M0. colon   . Colon polyp 2011  . COVID-19 07/2019  . Diastolic dysfunction    a. 08/2015 Echo: EF 60-65%, no rwma, Gr1 DD, midlly dil LA, PASP 110mmHg. No significant valvular dzs; b. 06/2017 Echo: EF 60-65%, Gr1DD, mildly dil LA.  . Essential hypertension   . GERD (gastroesophageal reflux disease)   . Gout   . History of appendectomy 06/07/2015  . History of kidney cancer 11/20/2018  . History of kidney stones    passed - 2  . Hyperlipidemia   . Lumbar spinal stenosis   . Myocardial infarction (  [place])    06/2017  . Nonexudative age-related macular degeneration, bilateral, early dry stage 11/20/2018  . Obesity   . Prediabetes   . Pseudophakia of both eyes   . Retinal cyst    Past Surgical History:  Procedure Laterality Date  . ABDOMINAL HYSTERECTOMY    . APPENDECTOMY  06/07/2015  . CARDIAC CATHETERIZATION    . CATARACT EXTRACTION W/PHACO Left 04/24/2016   Procedure: CATARACT EXTRACTION PHACO AND INTRAOCULAR LENS PLACEMENT (IOC);  Surgeon: Birder Robson, MD;  Location: [facility] ORS;  Service: Ophthalmology;  Laterality: Left;  [place] 45.8 AP% 16.4 CDE 7.53 FLUID PACK LOT # [id number]  . CATARACT EXTRACTION W/PHACO Right 06/05/2016   Procedure: CATARACT EXTRACTION PHACO AND INTRAOCULAR LENS PLACEMENT (IOC);  Surgeon: Birder Robson, MD;  Location: [facility] ORS;  Service: Ophthalmology;  Laterality: Right;  [place] 25.9 AP% 21.9 CDE 5.68 Fluid  pack lot # [id number]  . COLON RESECTION  03/04/1991  . COLON SURGERY    . COLONOSCOPY  03-14-10   Dr Bary Castilla, tubular adenoma at 25 cm.  . COLONOSCOPY WITH PROPOFOL N/A 06/07/2015   Procedure: COLONOSCOPY WITH PROPOFOL;  Surgeon: Robert Bellow, MD;  Location: [facility] END[facility]COPY;  Service: Endoscopy;  Laterality: N/A;  . CORONARY STENT INTERVENTION N/A 06/28/2017   Procedure: CORONARY STENT INTERVENTION;  Surgeon: Yolonda Kida, MD;  Location: [place] CV LAB;  Service: Cardiovascular;  Laterality: N/A;  . EYE SURGERY     lens eye surgery  . HERNIA REPAIR  5009   umbilical  . LEFT HEART CATH AND CORONARY ANGIOGRAPHY N/A 06/28/2017   Procedure: LEFT HEART CATH AND CORONARY ANGIOGRAPHY;  Surgeon: Minna Merritts, MD;  Location: [place] CV LAB;  Service: Cardiovascular;  Laterality: N/A;  . LUMBAR LAMINECTOMY/DECOMPRESSION MICRODISCECTOMY Bilateral 03/10/2018   Procedure: Laminectomy and Foraminotomy - Lumbar three-Lumbar four - Lumbar four-Lumbar five - bilateral;  Surgeon: Earnie Larsson, MD;  Location: [place];  Service: Neurosurgery;  Laterality: Bilateral;  . NEPHRECTOMY  1992   left- cancer  . RIB RESECTION     removal 4 ribs  . TONSILLECTOMY     Social History   Socioeconomic History  . Marital status: Single    Spouse name: Not on file  . Number of children: Not on file  . Years of education: Not on file  . Highest education level: Not on file  Occupational History  . Occupation: Metallurgist: [employer]  Tobacco Use  . Smoking status: Never Smoker  . Smokeless tobacco: Never Used  Vaping Use  . Vaping Use: Never used  Substance and Sexual Activity  . Alcohol use: No  . Drug use: No  . Sexual activity: Not on file  Other Topics Concern  . Not on file  Social History Narrative  . Not on file   Social Determinants of Health   Financial Resource Strain:   . Difficulty of Paying Living Expenses: Not on file  Food Insecurity:   . Worried About  Charity fundraiser in the Last Year: Not on file  . Ran Out of Food in the Last Year: Not on file  Transportation Needs:   . Lack of Transportation (Medical): Not on file  . Lack of Transportation (Non-Medical): Not on file  Physical Activity:   . Days of Exercise per Week: Not on file  . Minutes of Exercise per Session: Not on file  Stress:   . Feeling of Stress : Not on file  Social Connections:   .  Frequency of Communication with Friends and Family: Not on file  . Frequency of Social Gatherings with Friends and Family: Not on file  . Attends Religious Services: Not on file  . Active Member of Clubs or Organizations: Not on file  . Attends Archivist Meetings: Not on file  . Marital Status: Not on file  Intimate Partner Violence:   . Fear of Current or Ex-Partner: Not on file  . Emotionally Abused: Not on file  . Physically Abused: Not on file  . Sexually Abused: Not on file   Social History   Social History Narrative  . Not on file     ROS: Negative.     PE: HEENT: Negative. Lungs: Clear. Cardio: RR.    Assessment/Plan:  Proceed with planned endoscopy.   Forest Gleason [facility] 08/10/2020

## 2020-08-10 NOTE — Transfer of Care (Signed)
Immediate Anesthesia Transfer of Care Note  Patient: Carrie Larsen  Procedure(s) Performed: COLONOSCOPY WITH PROPOFOL (N/A )  Patient Location: PACU  Anesthesia Type:General  Level of Consciousness: drowsy and patient cooperative  Airway & Oxygen Therapy: Patient Spontanous Breathing and Patient connected to nasal cannula oxygen  Post-op Assessment: Report given to RN and Post -op Vital signs reviewed and stable  Post vital signs: Reviewed and stable  Last Vitals:  Vitals Value Taken Time  BP 132/66 08/10/20 0820  Temp    Pulse 56 08/10/20 0820  Resp 27 08/10/20 0820  SpO2 100 % 08/10/20 0820  Vitals shown include unvalidated device data.  Last Pain:  Vitals:   08/10/20 0820  TempSrc:   PainSc: Asleep         Complications: No complications documented.

## 2020-08-10 NOTE — Anesthesia Postprocedure Evaluation (Signed)
Anesthesia Post Note  Patient: Carrie Larsen  Procedure(s) Performed: COLONOSCOPY WITH PROPOFOL (N/A )  Patient location during evaluation: PACU Anesthesia Type: General Level of consciousness: awake and alert Pain management: pain level controlled Vital Signs Assessment: post-procedure vital signs reviewed and stable Respiratory status: spontaneous breathing, nonlabored ventilation, respiratory function stable and patient connected to nasal cannula oxygen Cardiovascular status: blood pressure returned to baseline and stable Postop Assessment: no apparent nausea or vomiting Anesthetic complications: no   No complications documented.   Last Vitals:  Vitals:   08/10/20 0830 08/10/20 0840  BP: (!) 145/68 (!) 154/84  Pulse: (!) 54 (!) 55  Resp: (!) 24 14  Temp:    SpO2: 100% 100%    Last Pain:  Vitals:   08/10/20 0840  TempSrc:   PainSc: 0-No pain                 Molli Barrows

## 2020-08-10 NOTE — Op Note (Signed)
[facility] Patient Name: Carrie Larsen Procedure Date: 08/10/2020 7:29 AM MRN: [id number] Account #: [id number] Date of Birth: Jul 27, 1946 Admit Type: Outpatient Age: 74 Room: [facility] ENDO ROOM 1 Gender: Female Note Status: Finalized Procedure:             Colonoscopy Indications:           High risk colon cancer surveillance: Personal history                         of colon cancer Providers:             Robert Bellow, MD Referring MD:          No Local Md, MD (Referring MD) Medicines:             Monitored Anesthesia Care Complications:         No immediate complications. Procedure:             Pre-Anesthesia Assessment:                        - Prior to the procedure, a History and Physical was                         performed, and patient medications, allergies and                         sensitivities were reviewed. The patient's tolerance                         of previous anesthesia was reviewed.                        - The risks and benefits of the procedure and the                         sedation options and risks were discussed with the                         patient. All questions were answered and informed                         consent was obtained.                        After obtaining informed consent, the colonoscope was                         passed under direct vision. Throughout the procedure,                         the patient's blood pressure, pulse, and oxygen                         saturations were monitored continuously. The was                         introduced through the anus and advanced to the the                         cecum, identified  by appendiceal orifice and ileocecal                         valve. The colonoscopy was somewhat difficult due to a                         tortuous colon. Successful completion of the procedure                         was aided by using manual pressure. The patient                          tolerated the procedure well. The quality of the bowel                         preparation was adequate to identify polyps 6 mm and                         larger in size. Findings:      A 5 mm polyp was found in the ascending colon. The polyp was sessile.       Biopsies were taken with a cold forceps for histology. Removed       completely.      A 12 mm polyp was found in the sigmoid colon. The polyp was sessile. The       polyp was removed with a hot snare. Resection and retrieval were       complete. To prevent bleeding post-intervention, one hemostatic clip was       successfully placed (MR conditional). There was no bleeding during, or       at the end, of the procedure.      A 5 mm polyp was found in the rectum. The polyp was sessile. The polyp       was removed with a hot snare. Resection and retrieval were complete. Impression:            - One 5 mm polyp in the ascending colon. Biopsied.                        - One 12 mm polyp in the sigmoid colon, removed with a                         hot snare. Resected and retrieved. Clip (MR                         conditional) was placed.                        - One 5 mm polyp in the rectum, removed with a hot                         snare. Resected and retrieved. Recommendation:        - Telephone endoscopist for pathology results in 1                         week. Procedure Code(s):     --- Professional ---  45385, Colonoscopy, flexible; with removal of                         tumor(s), polyp(s), or other lesion(s) by snare                         technique                        45380, 59, Colonoscopy, flexible; with biopsy, single                         or multiple Diagnosis Code(s):     --- Professional ---                        U13.244, Personal history of other malignant neoplasm                         of large intestine                        K63.5, Polyp of colon                         K62.1, Rectal polyp CPT copyright 2019 [employer] Association. All rights reserved. The codes documented in this report are preliminary and upon coder review may  be revised to meet current compliance requirements. Robert Bellow, MD 08/10/2020 8:19:52 AM This report has been signed electronically. Number of Addenda: 0 Note Initiated On: 08/10/2020 7:29 AM Scope Withdrawal Time: 0 hours 21 minutes 27 seconds  Total Procedure Duration: 0 hours 40 minutes 50 seconds  Estimated Blood Loss:  Estimated blood loss: none.      [facility]

## 2020-08-10 NOTE — Anesthesia Preprocedure Evaluation (Signed)
Anesthesia Evaluation  Patient identified by MRN, date of birth, ID band Patient awake    Reviewed: Allergy & Precautions, H&P , NPO status , Patient's Chart, lab work & pertinent test results, reviewed documented beta blocker date and time   Airway Mallampati: II   Neck ROM: full    Dental  (+) Poor Dentition   Pulmonary neg pulmonary ROS,    Pulmonary exam normal        Cardiovascular Exercise Tolerance: Poor hypertension, On Medications + CAD, + Past MI and + Cardiac Stents  (-) CHF Normal cardiovascular exam+ Valvular Problems/Murmurs  Rhythm:regular Rate:Normal     Neuro/Psych Anxiety  Neuromuscular disease negative psych ROS   GI/Hepatic Neg liver ROS, GERD  Medicated,  Endo/Other  negative endocrine ROS  Renal/GU Renal disease  negative genitourinary   Musculoskeletal   Abdominal   Peds  Hematology negative hematology ROS (+)   Anesthesia Other Findings Past Medical History: No date: Anxiety No date: Arthritis     Comment:  osteoarthritis No date: CAD (coronary artery disease)     Comment:  a. 2012 ETT: no ischemia;  b. 06/2017 NSTEMI/PCI: LM nl,               LAD nl, D1 70-80p, LCX nl, OM1 90 (3.0 x 23 Xience               [place]), RCA dominant, 100p/m, fills via L->R collats, EF              55-65%. No date: Cancer [facility])     Comment:  a. s/p partial left nephrectomy. No date: Chicken pox 11/20/2018: CME (cystoid macular edema), left 1992: Colon cancer ([place])     Comment:  T3, N1, M0. colon  2011: Colon polyp 07/2019: [id number] No date: Diastolic dysfunction     Comment:  a. 08/2015 Echo: EF 60-65%, no rwma, Gr1 DD, midlly dil               LA, PASP 49mmHg. No significant valvular dzs; b. 06/2017               Echo: EF 60-65%, Gr1DD, mildly dil LA. No date: Essential hypertension No date: GERD (gastroesophageal reflux disease) No date: Gout 06/07/2015: History of appendectomy 11/20/2018: History  of kidney cancer No date: History of kidney stones     Comment:  passed - 2 No date: Hyperlipidemia No date: Lumbar spinal stenosis No date: Myocardial infarction [facility])     Comment:  06/2017 11/20/2018: Nonexudative age-related macular degeneration, bilateral,  early dry stage No date: Obesity No date: Prediabetes No date: Pseudophakia of both eyes No date: Retinal cyst Past Surgical History: No date: ABDOMINAL HYSTERECTOMY 06/07/2015: APPENDECTOMY No date: CARDIAC CATHETERIZATION 04/24/2016: CATARACT EXTRACTION W/PHACO; Left     Comment:  Procedure: CATARACT EXTRACTION PHACO AND INTRAOCULAR               LENS PLACEMENT (IOC);  Surgeon: Birder Robson, MD;                Location: [facility] ORS;  Service: Ophthalmology;  Laterality:              Left;  [place] 45.8 AP% 16.4 CDE 7.53 FLUID PACK LOT #               [id number] 06/05/2016: CATARACT EXTRACTION W/PHACO; Right     Comment:  Procedure: CATARACT EXTRACTION PHACO AND INTRAOCULAR  LENS PLACEMENT (IOC);  Surgeon: Birder Robson, MD;                Location: [facility] ORS;  Service: Ophthalmology;  Laterality:              Right;  [place] 25.9 AP% 21.9 CDE 5.68 Fluid pack lot #               [id number] 03/04/1991: COLON RESECTION No date: COLON SURGERY 03-14-10: COLONOSCOPY     Comment:  Dr Bary Castilla, tubular adenoma at 25 cm. 06/07/2015: COLONOSCOPY WITH PROPOFOL; N/A     Comment:  Procedure: COLONOSCOPY WITH PROPOFOL;  Surgeon: Robert Bellow, MD;  Location: [facility] ENDOSCOPY;  Service:               Endoscopy;  Laterality: N/A; 06/28/2017: CORONARY STENT INTERVENTION; N/A     Comment:  Procedure: CORONARY STENT INTERVENTION;  Surgeon:               Yolonda Kida, MD;  Location: [place] CV LAB;               Service: Cardiovascular;  Laterality: N/A; No date: EYE SURGERY     Comment:  lens eye surgery 1993: HERNIA REPAIR     Comment:  umbilical 03/01/8591: LEFT HEART CATH AND CORONARY ANGIOGRAPHY; N/A      Comment:  Procedure: LEFT HEART CATH AND CORONARY ANGIOGRAPHY;                Surgeon: Minna Merritts, MD;  Location: [place]               CV LAB;  Service: Cardiovascular;  Laterality: N/A; 03/10/2018: LUMBAR LAMINECTOMY/DECOMPRESSION MICRODISCECTOMY; Bilateral     Comment:  Procedure: Laminectomy and Foraminotomy - Lumbar               three-Lumbar four - Lumbar four-Lumbar five - bilateral;               Surgeon: Earnie Larsson, MD;  Location: [place];  Service:               Neurosurgery;  Laterality: Bilateral; 1992: NEPHRECTOMY     Comment:  left- cancer No date: RIB RESECTION     Comment:  removal 4 ribs No date: TONSILLECTOMY BMI    Body Mass Index: 35.87 kg/m     Reproductive/Obstetrics negative OB ROS                             Anesthesia Physical Anesthesia Plan  ASA: III  Anesthesia Plan: General   Post-op Pain Management:    Induction:   PONV Risk Score and Plan:   Airway Management Planned:   Additional Equipment:   Intra-op Plan:   Post-operative Plan:   Informed Consent: I have reviewed the patients History and Physical, chart, labs and discussed the procedure including the risks, benefits and alternatives for the proposed anesthesia with the patient or authorized representative who has indicated his/her understanding and acceptance.     Dental Advisory Given  Plan Discussed with: CRNA  Anesthesia Plan Comments:         Anesthesia Quick Evaluation

## 2020-08-11 LAB — SURGICAL PATHOLOGY

## 2020-08-17 ENCOUNTER — Ambulatory Visit: Payer: Medicare Other | Admitting: Rheumatology

## 2020-09-05 ENCOUNTER — Encounter: Payer: Self-pay | Admitting: Podiatry

## 2020-09-05 ENCOUNTER — Other Ambulatory Visit: Payer: Self-pay

## 2020-09-05 ENCOUNTER — Ambulatory Visit (INDEPENDENT_AMBULATORY_CARE_PROVIDER_SITE_OTHER): Payer: Medicare Other | Admitting: Podiatry

## 2020-09-05 DIAGNOSIS — I2510 Atherosclerotic heart disease of native coronary artery without angina pectoris: Secondary | ICD-10-CM | POA: Diagnosis not present

## 2020-09-05 DIAGNOSIS — G5793 Unspecified mononeuropathy of bilateral lower limbs: Secondary | ICD-10-CM | POA: Diagnosis not present

## 2020-09-05 NOTE — Progress Notes (Signed)
She presents today for follow-up of her neuropathy as well as her arthropathy particular associated with gout.  She states right now with Lyrica she is doing great she is not taking her colchicine and is only taking 100 mg of allopurinol.  Objective: Vital signs are stable she is alert and oriented x3 there is no erythema edema cellulitis drainage or odor severe osteoarthritis and bunion deformity with hammertoe deformities are noted bilaterally no open lesions or wounds.  Assessment: Neuropathy under control at this point with 75 mg of Lyrica nightly.  Plan: Continue the Lyrica follow-up with me as needed or in 3 months

## 2020-09-08 ENCOUNTER — Other Ambulatory Visit: Payer: Self-pay | Admitting: Podiatry

## 2020-09-13 ENCOUNTER — Ambulatory Visit: Payer: Medicare Other | Admitting: Rheumatology

## 2020-09-21 ENCOUNTER — Emergency Department: Payer: Medicare Other

## 2020-09-21 ENCOUNTER — Encounter: Payer: Self-pay | Admitting: Emergency Medicine

## 2020-09-21 ENCOUNTER — Emergency Department
Admission: EM | Admit: 2020-09-21 | Discharge: 2020-09-21 | Disposition: A | Discharge status: Home / Self Care | Payer: Medicare Other | Attending: Student in an Organized Health Care Education/Training Program | Admitting: Student in an Organized Health Care Education/Training Program

## 2020-09-21 ENCOUNTER — Other Ambulatory Visit: Payer: Self-pay

## 2020-09-21 DIAGNOSIS — R531 Weakness: Secondary | ICD-10-CM | POA: Insufficient documentation

## 2020-09-21 DIAGNOSIS — Z5321 Procedure and treatment not carried out due to patient leaving prior to being seen by health care provider: Secondary | ICD-10-CM | POA: Insufficient documentation

## 2020-09-21 DIAGNOSIS — R002 Palpitations: Secondary | ICD-10-CM | POA: Insufficient documentation

## 2020-09-21 LAB — TROPONIN I (HIGH SENSITIVITY): Troponin I (High Sensitivity): 6 ng/L (ref <18)

## 2020-09-21 LAB — CBC
HCT: 43.3 % (ref 36.0–46.0)
Hemoglobin: 14.3 g/dL (ref 12.0–15.0)
MCH: 29.2 pg (ref 26.0–34.0)
MCHC: 33 g/dL (ref 30.0–36.0)
MCV: 88.4 fL (ref 80.0–100.0)
Platelets: 241 10*3/uL (ref 150–400)
RBC: 4.9 MIL/uL (ref 3.87–5.11)
RDW: 13.2 % (ref 11.5–15.5)
WBC: 6.9 10*3/uL (ref 4.0–10.5)
nRBC: 0 % (ref 0.0–0.2)

## 2020-09-21 LAB — BASIC METABOLIC PANEL
Anion gap: 9 (ref 5–15)
BUN: 21 mg/dL (ref 8–23)
CO2: 24 mmol/L (ref 22–32)
Calcium: 9.8 mg/dL (ref 8.9–10.3)
Chloride: 106 mmol/L (ref 98–111)
Creatinine, Ser: 0.81 mg/dL (ref 0.44–1.00)
GFR, Estimated: >60 mL/min (ref >60)
Glucose, Bld: 114 mg/dL — ABNORMAL HIGH (ref 70–99)
Potassium: 4.3 mmol/L (ref 3.5–5.1)
Sodium: 139 mmol/L (ref 135–145)

## 2020-09-21 NOTE — ED Triage Notes (Signed)
Pt comes into the ED via [facility] c/o weakness and palpitations.  Pt states that the weakness has been ongoing for a couple days, but the palpitations started today.  Pt has even and unlabored respirations at this time.  Pt denies any CP, SHOB, but does have dizziness that started a couple days ago.  Pt neurologically intact at this time. Pt does have a cardiac stent in place.

## 2020-09-21 NOTE — ED Triage Notes (Signed)
Pt comes into the ED via EMS from home with c/o weakness and palpitations fr the past couples of days. Pt is in NAD, VSS

## 2020-09-22 ENCOUNTER — Ambulatory Visit: Payer: Medicare Other | Admitting: Internal Medicine

## 2020-09-22 ENCOUNTER — Telehealth: Payer: Self-pay | Admitting: Emergency Medicine

## 2020-09-22 ENCOUNTER — Telehealth: Payer: Self-pay | Admitting: Cardiovascular Disease

## 2020-09-22 NOTE — Telephone Encounter (Signed)
Spoke with patient and she states that she went to ED and had to leave due to long waits. They did do EKG and told her to call our office for provider to review. She also wanted appointment to come in and see provider to discuss these results. I did advise her that there are numerous things that can cause various readings of EKG such as lead placement and she still persisted that she would like to come in and speak with provider. Advised that I would send this to provider for review and see if he can review compared to her previous EKG's and would call her back if no appointment is needed. She verbalized understanding with no further questions at this time.

## 2020-09-22 NOTE — Telephone Encounter (Signed)
Called patient due to left emergency department before provider exam to inquire about condition and follow up plans.  She has not called her doctor yet.  I asked her to call and  Have them review labs and ekg.  She will call now.

## 2020-09-22 NOTE — Telephone Encounter (Signed)
Patient went to ED through EMS. Patient was instructed to call our office regarding her EKG done in ED. Patient states a voicemail is fine

## 2020-09-23 ENCOUNTER — Ambulatory Visit
Admission: EM | Admit: 2020-09-23 | Discharge: 2020-09-23 | Disposition: A | Discharge status: Home / Self Care | Payer: Medicare Other | Attending: Physician Assistant | Admitting: Physician Assistant

## 2020-09-23 ENCOUNTER — Telehealth: Payer: Self-pay

## 2020-09-23 ENCOUNTER — Other Ambulatory Visit: Payer: Self-pay

## 2020-09-23 ENCOUNTER — Encounter: Payer: Self-pay | Admitting: Emergency Medicine

## 2020-09-23 DIAGNOSIS — N3000 Acute cystitis without hematuria: Secondary | ICD-10-CM | POA: Insufficient documentation

## 2020-09-23 DIAGNOSIS — R300 Dysuria: Secondary | ICD-10-CM

## 2020-09-23 LAB — URINALYSIS, COMPLETE (UACMP) WITH MICROSCOPIC
Bilirubin Urine: NEGATIVE
Glucose, UA: NEGATIVE mg/dL
Hgb urine dipstick: NEGATIVE
Ketones, ur: NEGATIVE mg/dL
Nitrite: NEGATIVE
Protein, ur: NEGATIVE mg/dL
Specific Gravity, Urine: >1.03 — ABNORMAL HIGH (ref 1.005–1.030)
pH: 5 (ref 5.0–8.0)

## 2020-09-23 MED ORDER — NITROFURANTOIN MONOHYD MACRO 100 MG PO CAPS: 100.0000 mg | ORAL_CAPSULE | Freq: Two times a day (BID) | ORAL | 0 refills | Status: AC

## 2020-09-23 NOTE — ED Triage Notes (Signed)
Patient c/o burning when urinating and lower back pain that started 3 days ago.

## 2020-09-23 NOTE — Discharge Instructions (Signed)

## 2020-09-23 NOTE — Telephone Encounter (Signed)
Natasha, [facility] agent, attempted to put call from pt. Through, call dropped. Will try to call pt. Back. Left message to call back.

## 2020-09-23 NOTE — ED Provider Notes (Signed)
[facility]    CSN: [id number] Arrival date & time: 09/23/20  1401      History   Chief Complaint Chief Complaint  Patient presents with   Dysuria   Back Pain    HPI Georgianna Band is a 74 y.o. female presenting for dysuria and right lower back pain for the past 3 days.  Denies injury.  Denies any urgency or frequency and says that she has been having difficulty urinating.  Denies any associated fever, chills, fatigue, abdominal pain, abnormal vaginal discharge, nausea or vomiting.  Denies hematuria.  She has not taken any medication for symptoms.   There are no other complaints or concerns at this time.  HPI  Past Medical History:  Diagnosis Date   Anxiety    Arthritis    osteoarthritis   CAD (coronary artery disease)    a. 2012 ETT: no ischemia;  b. 06/2017 NSTEMI/PCI: LM nl, LAD nl, D1 70-80p, LCX nl, OM1 90 (3.0 x 23 Xience [place]), RCA dominant, 100p/m, fills via L->R collats, EF 55-65%.   Cancer [facility])    a. s/p partial left nephrectomy.   Chicken pox    CME (cystoid macular edema), left 11/20/2018   Colon cancer ([place]) 1992   T3, N1, M0. colon    Colon polyp 2011   COVID-19 40/9811   Diastolic dysfunction    a. 08/2015 Echo: EF 60-65%, no rwma, Gr1 DD, midlly dil LA, PASP 55mmHg. No significant valvular dzs; b. 06/2017 Echo: EF 60-65%, Gr1DD, mildly dil LA.   Essential hypertension    GERD (gastroesophageal reflux disease)    Gout    History of appendectomy 06/07/2015   History of kidney cancer 11/20/2018   History of kidney stones    passed - 2   Hyperlipidemia    Lumbar spinal stenosis    Myocardial infarction ([place])    06/2017   Nonexudative age-related macular degeneration, bilateral, early dry stage 11/20/2018   Obesity    Prediabetes    Pseudophakia of both eyes    Retinal cyst     Patient Active Problem List   Diagnosis Date Noted   Personal history of kidney stones 12/25/2019   Choroidal nevus, right eye  11/20/2018   CME (cystoid macular edema), left 11/20/2018   Nonexudative age-related macular degeneration, bilateral, early dry stage 11/20/2018   Pseudophakia of both eyes 11/20/2018   Olecranon bursitis 10/16/2018   Contusion of knee 10/16/2018   Arthritis of knee 10/16/2018   Arthritis of hand 10/16/2018   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/10/2018   Morbid obesity ([place]) 09/11/2017   Non-STEMI (non-ST elevated myocardial infarction) ([place]) 06/27/2017   Abdominal pain 02/28/2017   History of renal cell carcinoma 02/20/2017   Knee pain 08/14/2016   Strain of hamstring muscle 08/08/2016   Left leg swelling 91/47/8295   Systolic murmur    Retrosternal chest pain 07/08/2015   History of colonic polyps 04/16/2015   Chest pain 03/08/2011   Hyperlipidemia 03/08/2011   HTN (hypertension) 03/08/2011    Past Surgical History:  Procedure Laterality Date   ABDOMINAL HYSTERECTOMY     APPENDECTOMY  06/07/2015   CARDIAC CATHETERIZATION     CATARACT EXTRACTION W/PHACO Left 04/24/2016   Procedure: CATARACT EXTRACTION PHACO AND INTRAOCULAR LENS PLACEMENT ([place]);  Surgeon: Birder Robson, MD;  Location: [facility] ORS;  Service: Ophthalmology;  Laterality: Left;  [place] 45.8 AP% 16.4 CDE 7.53 FLUID PACK LOT # [id number]   CATARACT EXTRACTION W/PHACO Right 06/05/2016   Procedure: CATARACT  EXTRACTION PHACO AND INTRAOCULAR LENS PLACEMENT (IOC);  Surgeon: Birder Robson, MD;  Location: [facility] ORS;  Service: Ophthalmology;  Laterality: Right;  [place] 25.9 AP% 21.9 CDE 5.68 Fluid pack lot # [id number]   COLON RESECTION  03/04/1991   COLON SURGERY     COLONOSCOPY  03-14-10   Dr Bary Castilla, tubular adenoma at 25 cm.   COLONOSCOPY WITH PROPOFOL N/A 06/07/2015   Procedure: COLONOSCOPY WITH PROPOFOL;  Surgeon: Robert Bellow, MD;  Location: [facility] ENDOSCOPY;  Service: Endoscopy;  Laterality: N/A;   COLONOSCOPY WITH PROPOFOL N/A 08/10/2020   Procedure: COLONOSCOPY WITH PROPOFOL;   Surgeon: Robert Bellow, MD;  Location: [facility] ENDOSCOPY;  Service: Endoscopy;  Laterality: N/A;   CORONARY STENT INTERVENTION N/A 06/28/2017   Procedure: CORONARY STENT INTERVENTION;  Surgeon: Yolonda Kida, MD;  Location: [place] CV LAB;  Service: Cardiovascular;  Laterality: N/A;   EYE SURGERY     lens eye surgery   HERNIA REPAIR  [id number]   umbilical   LEFT HEART CATH AND CORONARY ANGIOGRAPHY N/A 06/28/2017   Procedure: LEFT HEART CATH AND CORONARY ANGIOGRAPHY;  Surgeon: Minna Merritts, MD;  Location: [place] CV LAB;  Service: Cardiovascular;  Laterality: N/A;   LUMBAR LAMINECTOMY/DECOMPRESSION MICRODISCECTOMY Bilateral 03/10/2018   Procedure: Laminectomy and Foraminotomy - Lumbar three-Lumbar four - Lumbar four-Lumbar five - bilateral;  Surgeon: Earnie Larsson, MD;  Location: [place];  Service: Neurosurgery;  Laterality: Bilateral;   NEPHRECTOMY  1992   left- cancer   RIB RESECTION     removal 4 ribs   TONSILLECTOMY      OB History    Gravida  1   Para  1   Term      Preterm      AB      Living  1     SAB      TAB      Ectopic      Multiple      Live Births           Obstetric Comments  1st Menstrual Cycle:  15 1st Pregnancy:  25         Home Medications    Prior to Admission medications   Medication Sig Start Date End Date Taking? Authorizing Provider  aspirin EC 81 MG tablet Take 81 mg by mouth daily. Swallow whole.   Yes [provider]  atorvastatin (LIPITOR) 80 MG tablet Take 80 mg by mouth daily.   Yes [provider]  carvedilol (COREG) 12.5 MG tablet Take 1.5 tablets (18.75 mg total) by mouth 2 (two) times daily with a meal. 03/24/20  Yes Theora Gianotti, NP  clopidogrel (PLAVIX) 75 MG tablet Take 1 tablet (75 mg total) by mouth daily. 08/14/20  Yes Byrnett, Forest Gleason, MD  colchicine 0.6 MG tablet TAKE 1 TABLET BY MOUTH THREE TIMES DAILY UNTIL GI UPSET OR FLARE SUBSIDES, THEN FOR MAINTENANCE TAKE 1  TABLET ONCE DAILY 09/08/20  Yes Hyatt, Max T, DPM  esomeprazole (NEXIUM) 40 MG capsule Take 40 mg by mouth daily at 3 pm.    Yes [provider]  isosorbide mononitrate (IMDUR) 30 MG 24 hr tablet Take 1 tablet (30 mg total) by mouth in the morning and at bedtime. 05/25/20  Yes Loel Dubonnet, NP  Multiple Vitamins-Minerals (MULTIVITAMIN WITH MINERALS) tablet Take 1 tablet by mouth daily.   Yes [provider]  traMADol (ULTRAM) 50 MG tablet Take 50 mg by mouth daily.  05/18/19  Yes [provider]  allopurinol (ZYLOPRIM) 100 MG tablet Take 1 tablet (100 mg total) by mouth 2 (two) times daily. Patient not taking: Reported on 08/10/2020 01/13/20   Hyatt, Max T, DPM  lidocaine (LIDODERM) 5 % Place 1 patch onto the skin daily. Remove & Discard patch within 12 hours or as directed by MD    [provider]  nitrofurantoin, macrocrystal-monohydrate, (MACROBID) 100 MG capsule Take 1 capsule (100 mg total) by mouth 2 (two) times daily for 5 days. 09/23/20 09/28/20  Danton Clap, PA-C  nitroGLYCERIN (NITROSTAT) 0.4 MG SL tablet Place 1 tablet (0.4 mg total) under the tongue every 5 (five) minutes x 3 doses as needed for chest pain. 01/12/19   Minna Merritts, MD  pregabalin (LYRICA) 75 MG capsule Take 1 capsule (75 mg total) by mouth 2 (two) times daily. 08/03/20   Hyatt, Max T, DPM    Family History Family History  Problem Relation Age of Onset   Colon cancer Mother    Cerebral aneurysm Father    Heart attack Father    Heart attack Brother 48    Social History Social History   Tobacco Use   Smoking status: Never Smoker   Smokeless tobacco: Never Used  Scientific laboratory technician Use: Never used  Substance Use Topics   Alcohol use: No   Drug use: No     Allergies   Morphine and related, Prednisone, and Latex   Review of Systems Review of Systems  Constitutional: Negative for fatigue and fever.  Gastrointestinal: Negative for abdominal pain, nausea  and vomiting.  Genitourinary: Positive for difficulty urinating, dysuria and flank pain. Negative for frequency, hematuria, urgency, vaginal bleeding, vaginal discharge and vaginal pain.  Musculoskeletal: Positive for back pain.  Skin: Negative for rash.  Neurological: Negative for weakness.     Physical Exam Triage Vital Signs ED Triage Vitals  Enc Vitals Group     BP 09/23/20 1527 (!) 159/103     Pulse Rate 09/23/20 1527 77     Resp 09/23/20 1527 16     Temp 09/23/20 1527 97.9 F (36.6 C)     Temp Source 09/23/20 1527 Oral     SpO2 09/23/20 1527 99 %     Weight 09/23/20 1525 260 lb (117.9 kg)     Height 09/23/20 1525 5\' 10"  (1.778 m)     Head Circumference --      Peak Flow --      Pain Score 09/23/20 1524 6     Pain Loc --      Pain Edu? --      Excl. in [place]? --    No data found.  Updated Vital Signs BP (!) 159/103 (BP Location: Left Arm)    Pulse 77    Temp 97.9 F (36.6 C) (Oral)    Resp 16    Ht 5\' 10"  (1.778 m)    Wt 260 lb (117.9 kg)    SpO2 99%    BMI 37.31 kg/m       Physical Exam Vitals and nursing note reviewed.  Constitutional:      General: She is not in acute distress.    Appearance: Normal appearance. She is obese. She is not ill-appearing or toxic-appearing.  HENT:     Head: Normocephalic and atraumatic.  Eyes:     General: No scleral icterus.       Right eye: No discharge.        Left eye: No discharge.  Conjunctiva/sclera: Conjunctivae normal.  Cardiovascular:     Rate and Rhythm: Normal rate and regular rhythm.     Heart sounds: Normal heart sounds.  Pulmonary:     Effort: Pulmonary effort is normal. No respiratory distress.     Breath sounds: Normal breath sounds.  Abdominal:     Palpations: Abdomen is soft.     Tenderness: There is no abdominal tenderness. There is no right CVA tenderness, left CVA tenderness or guarding.  Musculoskeletal:     Cervical back: Neck supple.  Skin:    General: Skin is dry.  Neurological:     General:  No focal deficit present.     Mental Status: She is alert. Mental status is at baseline.     Motor: No weakness.     Gait: Gait normal.  Psychiatric:        Mood and Affect: Mood normal.        Behavior: Behavior normal.        Thought Content: Thought content normal.      UC Treatments / Results  Labs (all labs ordered are listed, but only abnormal results are displayed) Labs Reviewed  URINALYSIS, COMPLETE (UACMP) WITH MICROSCOPIC - Abnormal; Notable for the following components:      Result Value   APPearance HAZY (*)    Specific Gravity, Urine >1.030 (*)    Leukocytes,Ua SMALL (*)    Bacteria, UA FEW (*)    All other components within normal limits  URINE CULTURE    EKG   Radiology No results found.  Procedures Procedures (including critical care time)  Medications Ordered in UC Medications - No data to display  Initial Impression / Assessment and Plan / UC Course  I have reviewed the triage vital signs and the nursing notes.  Pertinent labs & imaging results that were available during my care of the patient were reviewed by me and considered in my medical decision making (see chart for details).   UA shows small white blood cells.  Specific gravity is elevated greater than 1.030.  Urine sent for culture.  Based on patient symptoms and urinalysis results, suspect possible UTI at this time.  Also advised patient that symptoms could be due to the fact that she is low dehydrated.  Advised her to increase fluid intake and begin antibiotics.  Advised that we will call with results in the next couple days.  She has any new or worsening symptoms she should return or give [place] a call.  ED precautions discussed including fever and any increased back or abdominal pain.  Final Clinical Impressions(s) / UC Diagnoses   Final diagnoses:  Acute cystitis without hematuria  Dysuria     Discharge Instructions     UTI: Based on either symptoms or urinalysis, you may have a  urinary tract infection. We will send the urine for culture and call with results in a few days. Begin antibiotics at this time. Your symptoms should be much improved over the next 2-3 days. Increase rest and fluid intake. If for some reason symptoms are worsening or not improving after a couple of days or the urine culture determines the antibiotics you are taking will not treat the infection, the antibiotics may be changed. Return or go to ER for fever, back pain, worsening urinary pain, discharge, increased blood in urine. May take Tylenol or Motrin OTC for pain relief or consider AZO if no contraindications     ED Prescriptions    Medication Sig Dispense Auth.  Provider   nitrofurantoin, macrocrystal-monohydrate, (MACROBID) 100 MG capsule Take 1 capsule (100 mg total) by mouth 2 (two) times daily for 5 days. 10 capsule Danton Clap, PA-C     PDMP not reviewed this encounter.   Danton Clap, PA-C 09/23/20 1629

## 2020-09-24 ENCOUNTER — Telehealth: Payer: Self-pay | Admitting: Emergency Medicine

## 2020-09-24 NOTE — Telephone Encounter (Signed)
Patient left message that her pharmacy has not received her prescription that he e- scribed yesterday at her visit.  Patient's prescription for Macrobid was called into her [facility] in [place] on [address].

## 2020-09-25 LAB — URINE CULTURE

## 2020-09-25 NOTE — Telephone Encounter (Signed)
EKG is unchanged from prior EKGs She was seen 2 days later for what sounded like a urinary tract infection, may be related or causing her palpitations? She is welcome to schedule a visit if she wants

## 2020-09-27 NOTE — Telephone Encounter (Signed)
Spoke with patient and reviewed provider review of previous EKG's with no changes noted. She verbalized understanding and decided that she would like to cancel appointment with provider at this time. Advised that I would be happy to take care of that and if she should have any further concerns to call back and we can reschedule if needed. She verbalized understanding of our conversation, agreement with plan, and had no further questions at this time.

## 2020-09-27 NOTE — Telephone Encounter (Signed)
Left voicemail message to call back to review recommendations.

## 2020-09-27 NOTE — Telephone Encounter (Signed)
Patient is returning your call, states it is okay to leave a detailed message on her voicemail.

## 2020-10-03 ENCOUNTER — Ambulatory Visit: Payer: Medicare Other | Admitting: Cardiovascular Disease

## 2020-10-12 ENCOUNTER — Other Ambulatory Visit: Payer: Self-pay

## 2020-10-12 MED ORDER — NITROGLYCERIN 0.4 MG SL SUBL: 0.4000 mg | SUBLINGUAL_TABLET | SUBLINGUAL | 1 refills | Status: DC | PRN

## 2020-11-16 ENCOUNTER — Other Ambulatory Visit: Payer: Self-pay | Admitting: Podiatry

## 2020-11-21 ENCOUNTER — Telehealth: Payer: Self-pay | Admitting: Cardiovascular Disease

## 2020-11-21 MED ORDER — CARVEDILOL 12.5 MG PO TABS
18.7500 mg | ORAL_TABLET | Freq: Two times a day (BID) | ORAL | 2 refills | Status: DC
Start: 2020-11-21 — End: 2021-08-17

## 2020-11-21 NOTE — Telephone Encounter (Signed)
Received fax from pharmacy requesting refill for Carvedilol 12.5 mg---1 & 1/2 tab po BID. Rx request sent to pharmacy.

## 2020-11-23 ENCOUNTER — Other Ambulatory Visit: Payer: Self-pay

## 2020-11-23 ENCOUNTER — Ambulatory Visit (INDEPENDENT_AMBULATORY_CARE_PROVIDER_SITE_OTHER): Payer: Medicare Other | Admitting: Podiatry

## 2020-11-23 ENCOUNTER — Encounter: Payer: Self-pay | Admitting: Podiatry

## 2020-11-23 ENCOUNTER — Ambulatory Visit: Payer: Medicare Other | Admitting: Podiatry

## 2020-11-23 DIAGNOSIS — M10471 Other secondary gout, right ankle and foot: Secondary | ICD-10-CM

## 2020-11-23 DIAGNOSIS — M10472 Other secondary gout, left ankle and foot: Secondary | ICD-10-CM | POA: Diagnosis not present

## 2020-11-23 MED ORDER — DEXAMETHASONE SODIUM PHOSPHATE 4 MG/ML IJ SOLN: 4.0000 mg | Freq: Once | INTRAMUSCULAR | Status: DC

## 2020-11-23 MED ORDER — TRIAMCINOLONE ACETONIDE 10 MG/ML IJ SUSP: 10.0000 mg | Freq: Once | INTRAMUSCULAR | Status: DC

## 2020-11-23 NOTE — Progress Notes (Signed)
°  Subjective:  Patient ID: Carrie Larsen, female    DOB: 1946/08/24,  MRN: [id number]  Chief Complaint  Patient presents with   Gout    Patient presents today for gout flare up past 5 days bilat feet/ankles.  She says its a little better today and the swelling has gone down, but still really sore.      74 y.o. female presents with the above complaint. History confirmed with patient.  She previously has seen Dr. Al Corpus for this.  He recommended her to follow-up with rheumatologist as this recurrent chronic issue for her.  She has not done so.  Objective:  Physical Exam: warm, good capillary refill, no trophic changes or ulcerative lesions, normal DP and PT pulses and normal sensory exam.  Bilaterally she has mild diffuse edema throughout the foot and ankle with left worse than right.  She has pain on palpation and with range of motion of the left ankle joint.  Less so with the right ankle joint.  None and metatarsophalangeal joints.  Assessment:   1. Other secondary acute gout of left ankle   2. Other secondary acute gout of right ankle      Plan:  Patient was evaluated and treated and all questions answered.  Encouraged her that she needs to see rheumatology to get better chronic management of her gout so this is not a recurrent flare issue for her.  She is currently taking oral colchicine and this is helping.  Advised her to continue this.  Recommended injection of the bilateral ankle joint today.  Following sterile prep with Betadine, 0.5 cc each of 0.5% Marcaine plain and 2% Xylocaine plain as well as 5 mg of Kenalog and 2 mg of dexamethasone phosphate was injected into each ankle.  She tolerated the procedure well and was dressed with a Band-Aid.   Return if symptoms worsen or fail to improve.

## 2020-12-15 ENCOUNTER — Other Ambulatory Visit: Payer: Self-pay

## 2020-12-15 MED ORDER — ATORVASTATIN CALCIUM 80 MG PO TABS
80.0000 mg | ORAL_TABLET | Freq: Every day | ORAL | 0 refills | Status: DC
Start: 2020-12-15 — End: 2021-07-25

## 2020-12-15 MED ORDER — CLOPIDOGREL BISULFATE 75 MG PO TABS
75.0000 mg | ORAL_TABLET | Freq: Every day | ORAL | 0 refills | Status: DC
Start: 2020-12-15 — End: 2021-03-15

## 2020-12-26 ENCOUNTER — Ambulatory Visit: Payer: Medicare Other | Admitting: Urology

## 2020-12-28 ENCOUNTER — Ambulatory Visit: Payer: Medicare Other | Admitting: Urology

## 2021-01-08 NOTE — Progress Notes (Signed)
01/09/2021 3:09 PM   [place] 1946-06-23 [id number]  Referring provider: [facility] [address] [place],   [address]  Chief Complaint  Patient presents with  . Follow-up    1 year follow-up   Urological history: 1. Renal cell carcinoma - s/p left nephrectomy ~27 years ago for RCC  2. Nephrolithiasis - no documented stones > 10 years  HPI: Carrie Larsen is a 75 y.o. female who presents today for a yearly follow up.    She has developed nocturia x 4-5 a night and filling up incontinence pads.  This has been going on for the last 6 months.  She sleeps alone, so she does not know if she is snoring or having apneic episodes.    She has tried restricting fluids after 5 pm, but that has not helped.  She denies any pedal edema.    She is not having daytime frequency or urgency.    Her PVR is 0 mL.    UA with 21-50 WBC's, rare bacteria and WBC clumps.   PMH: Past Medical History:  Diagnosis Date  . Anxiety   . Arthritis    osteoarthritis  . CAD (coronary artery disease)    a. 2012 ETT: no ischemia;  b. 06/2017 NSTEMI/PCI: LM nl, LAD nl, D1 70-80p, LCX nl, OM1 90 (3.0 x 23 Xience [place]), RCA dominant, 100p/m, fills via L->R collats, EF 55-65%.  . Cancer [facility])    a. s/p partial left nephrectomy.  . Chicken pox   . CME (cystoid macular edema), left 11/20/2018  . Colon cancer ([place]) 1992   T3, N1, M0. colon   . Colon polyp 2011  . COVID-19 07/2019  . Diastolic dysfunction    a. 08/2015 Echo: EF 60-65%, no rwma, Gr1 DD, midlly dil LA, PASP 20mmHg. No significant valvular dzs; b. 06/2017 Echo: EF 60-65%, Gr1DD, mildly dil LA.  . Essential hypertension   . GERD (gastroesophageal reflux disease)   . Gout   . History of appendectomy 06/07/2015  . History of kidney cancer 11/20/2018  . History of kidney stones    passed - 2  . Hyperlipidemia   . Lumbar spinal stenosis   . Myocardial infarction ([place])    06/2017  . Nonexudative age-related  macular degeneration, bilateral, early dry stage 11/20/2018  . Obesity   . Prediabetes   . Pseudophakia of both eyes   . Retinal cyst     Surgical History: Past Surgical History:  Procedure Laterality Date  . ABDOMINAL HYSTERECTOMY    . APPENDECTOMY  06/07/2015  . CARDIAC CATHETERIZATION    . CATARACT EXTRACTION W/PHACO Left 04/24/2016   Procedure: CATARACT EXTRACTION PHACO AND INTRAOCULAR LENS PLACEMENT (IOC);  Surgeon: Birder Robson, MD;  Location: [facility] ORS;  Service: Ophthalmology;  Laterality: Left;  [place] 45.8 AP% 16.4 CDE 7.53 FLUID PACK LOT # [id number]  . CATARACT EXTRACTION W/PHACO Right 06/05/2016   Procedure: CATARACT EXTRACTION PHACO AND INTRAOCULAR LENS PLACEMENT (IOC);  Surgeon: Birder Robson, MD;  Location: [facility] ORS;  Service: Ophthalmology;  Laterality: Right;  [place] 25.9 AP% 21.9 CDE 5.68 Fluid pack lot # [id number]  . COLON RESECTION  03/04/1991  . COLON SURGERY    . COLONOSCOPY  03-14-10   Dr Bary Castilla, tubular adenoma at 25 cm.  . COLONOSCOPY WITH PROPOFOL N/A 06/07/2015   Procedure: COLONOSCOPY WITH PROPOFOL;  Surgeon: Robert Bellow, MD;  Location: [facility]COPY;  Service: Endoscopy;  Laterality: N/A;  . COLONOSCOPY WITH PROPOFOL N/A 08/10/2020  Procedure: COLONOSCOPY WITH PROPOFOL;  Surgeon: Robert Bellow, MD;  Location: [facility];  Service: Endoscopy;  Laterality: N/A;  . CORONARY STENT INTERVENTION N/A 06/28/2017   Procedure: CORONARY STENT INTERVENTION;  Surgeon: Yolonda Kida, MD;  Location: [place] CV LAB;  Service: Cardiovascular;  Laterality: N/A;  . EYE SURGERY     lens eye surgery  . HERNIA REPAIR  [id number]   umbilical  . LEFT HEART CATH AND CORONARY ANGIOGRAPHY N/A 06/28/2017   Procedure: LEFT HEART CATH AND CORONARY ANGIOGRAPHY;  Surgeon: Minna Merritts, MD;  Location: [facility] CV LAB;  Service: Cardiovascular;  Laterality: N/A;  . LUMBAR LAMINECTOMY/DECOMPRESSION MICRODISCECTOMY Bilateral 03/10/2018   Procedure: Laminectomy and  Foraminotomy - Lumbar three-Lumbar four - Lumbar four-Lumbar five - bilateral;  Surgeon: Earnie Larsson, MD;  Location: [place];  Service: Neurosurgery;  Laterality: Bilateral;  . NEPHRECTOMY  1992   left- cancer  . RIB RESECTION     removal 4 ribs  . TONSILLECTOMY      Home Medications:  Allergies as of 01/09/2021      Reactions   Morphine And Related Nausea Only   Prednisone Nausea And Vomiting   Latex Rash      Medication List       Accurate as of January 09, 2021  3:09 PM. If you have any questions, ask your nurse or doctor.        STOP taking these medications   allopurinol 100 MG tablet Commonly known as: ZYLOPRIM Stopped by: Zara Council, PA-C     TAKE these medications   aspirin EC 81 MG tablet Take 81 mg by mouth daily. Swallow whole.   atorvastatin 80 MG tablet Commonly known as: LIPITOR Take 1 tablet (80 mg total) by mouth daily.   carvedilol 12.5 MG tablet Commonly known as: COREG Take 1.5 tablets (18.75 mg total) by mouth 2 (two) times daily with a meal.   clopidogrel 75 MG tablet Commonly known as: PLAVIX Take 1 tablet (75 mg total) by mouth daily.   colchicine 0.6 MG tablet TAKE 1 TABLET BY MOUTH THREE TIMES DAILY UNTIL GI UPSET OR FLARE SUBSIDES, THEN FOR MAINTENANCE TAKE 1 TABLET ONCE DAILY   esomeprazole 40 MG capsule Commonly known as: NEXIUM Take 40 mg by mouth daily in the afternoon.   Gemtesa 75 MG Tabs Generic drug: Vibegron Take 75 mg by mouth daily. Started by: Zara Council, PA-C   isosorbide mononitrate 30 MG 24 hr tablet Commonly known as: IMDUR Take 1 tablet (30 mg total) by mouth in the morning and at bedtime.   lidocaine 5 % Commonly known as: LIDODERM Place 1 patch onto the skin daily. Remove & Discard patch within 12 hours or as directed by MD   multivitamin with minerals tablet Take 1 tablet by mouth daily.   nitroGLYCERIN 0.4 MG SL tablet Commonly known as: NITROSTAT Place 1 tablet (0.4 mg total) under the tongue  every 5 (five) minutes x 3 doses as needed for chest pain.   pregabalin 75 MG capsule Commonly known as: Lyrica Take 1 capsule (75 mg total) by mouth 2 (two) times daily.   traMADol 50 MG tablet Commonly known as: ULTRAM Take 50 mg by mouth daily.       Allergies:  Allergies  Allergen Reactions  . Morphine And Related Nausea Only  . Prednisone Nausea And Vomiting  . Latex Rash    Family History: Family History  Problem Relation Age of Onset  . Colon cancer Mother   .  Cerebral aneurysm Father   . Heart attack Father   . Heart attack Brother 76    Social History:  reports that she has never smoked. She has never used smokeless tobacco. She reports that she does not drink alcohol and does not use drugs.  ROS: Pertinent ROS in HPI  Physical Exam: BP (!) 175/110   Pulse 85   Ht 5\' 10"  (1.778 m)   Wt 273 lb (123.8 kg)   BMI 39.17 kg/m   Constitutional:  Well nourished. Alert and oriented, No acute distress. HEENT: [place] AT, mask in place.  Trachea midline, no masses. Cardiovascular: No clubbing, cyanosis, or edema. Respiratory: Normal respiratory effort, no increased work of breathing. Neurologic: Grossly intact, no focal deficits, moving all 4 extremities. Psychiatric: Normal mood and affect.   Laboratory Data: Lab Results  Component Value Date   WBC 6.9 09/21/2020   HGB 14.3 09/21/2020   HCT 43.3 09/21/2020   MCV 88.4 09/21/2020   PLT 241 09/21/2020    Lab Results  Component Value Date   CREATININE 0.81 09/21/2020    Lab Results  Component Value Date   AST 18 06/30/2020   Lab Results  Component Value Date   ALT 22 06/30/2020    Urinalysis Component     Latest Ref Rng & Units 01/09/2021  Color, Urine     YELLOW YELLOW  Appearance     CLEAR HAZY (A)  Specific Gravity, Urine     1.005 - 1.030 >1.030 (H)  pH     5.0 - 8.0 6.0  Glucose, UA     NEGATIVE mg/dL NEGATIVE  Hgb urine dipstick     NEGATIVE TRACE (A)  Bilirubin Urine     NEGATIVE  NEGATIVE  Ketones, ur     NEGATIVE mg/dL NEGATIVE  Protein     NEGATIVE mg/dL NEGATIVE  Nitrite     NEGATIVE NEGATIVE  Leukocytes,Ua     NEGATIVE MODERATE (A)  Squamous Epithelial / LPF     0 - 5 6-10  WBC, UA     0 - 5 WBC/hpf 21-50  RBC / HPF     0 - 5 RBC/hpf 0-5  Bacteria, UA     NONE SEEN RARE (A)  WBC Clumps      PRESENT  I have reviewed the labs.   Pertinent Imaging: Results for MAROLYN, URSCHEL [facility] (MRN [id number]) as of 01/09/2021 15:11  Ref. 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 Nephrolithiasis - asymptomatic  3. Nocturia - I explained to the patient that nocturia is often multi-factorial and difficult to treat.  Sleeping disorders, heart conditions, peripheral vascular disease, diabetes, an enlarged prostate for men, an urethral stricture causing bladder outlet obstruction and/or certain medications can contribute to nocturia. - I have explained that research studies have showed that over 84% of patients with sleep apnea reported frequent nighttime urination.   With sleep apnea, oxygen decreases, carbon dioxide increases, the blood become more acidic, the heart rate drops and blood vessels in the lung constrict.  The body is then alerted that something is very wrong. The sleeper must wake enough to reopen the airway. By this time, the heart is racing and experiences a false signal of fluid overload. The heart excretes a hormone-like protein that tells the body to get rid of sodium and water, resulting in nocturia. - There is also an increased incidence  in sleep apnea with menopause, symptoms include night sweats, daytime sleepiness, depressed mood, and cognitive complaints like poor concentration or problems with short-term memory  - The patient may benefit from a discussion with his or her primary care physician to see if he or she has risk factors for sleep apnea or other sleep disturbances and  obtaining a sleep study. - Gemtesa 75 mg, # 28 samples given  - UA with WBC clumps, so will send for culture to see if an UTI is contributing to her nocturia  Return in about 3 weeks (around 01/30/2021) for renal ultrasound report and symptom recheck .  These notes generated with voice recognition software. I apologize for typographical errors.  Zara Council, PA-C  [facility] [address]  [place], [place] [address] [phone]

## 2021-01-09 ENCOUNTER — Other Ambulatory Visit
Admission: RE | Admit: 2021-01-09 | Discharge: 2021-01-09 | Disposition: A | Discharge status: Home / Self Care | Payer: Medicare Other | Source: Ambulatory Visit | Attending: Urology | Admitting: Urology

## 2021-01-09 ENCOUNTER — Ambulatory Visit (INDEPENDENT_AMBULATORY_CARE_PROVIDER_SITE_OTHER): Payer: Medicare Other | Admitting: Urology

## 2021-01-09 ENCOUNTER — Other Ambulatory Visit: Payer: Self-pay | Admitting: *Deleted

## 2021-01-09 ENCOUNTER — Encounter: Payer: Self-pay | Admitting: Urology

## 2021-01-09 VITALS — BP 175/110 | HR 85 | Ht 70.0 in | Wt 273.0 lb

## 2021-01-09 DIAGNOSIS — Z905 Acquired absence of kidney: Secondary | ICD-10-CM | POA: Insufficient documentation

## 2021-01-09 DIAGNOSIS — R351 Nocturia: Secondary | ICD-10-CM | POA: Insufficient documentation

## 2021-01-09 DIAGNOSIS — N200 Calculus of kidney: Secondary | ICD-10-CM

## 2021-01-09 DIAGNOSIS — N281 Cyst of kidney, acquired: Secondary | ICD-10-CM

## 2021-01-09 DIAGNOSIS — Z85528 Personal history of other malignant neoplasm of kidney: Secondary | ICD-10-CM | POA: Insufficient documentation

## 2021-01-09 LAB — URINALYSIS, COMPLETE (UACMP) WITH MICROSCOPIC
Bilirubin Urine: NEGATIVE
Glucose, UA: NEGATIVE mg/dL
Ketones, ur: NEGATIVE mg/dL
Nitrite: NEGATIVE
Protein, ur: NEGATIVE mg/dL
Specific Gravity, Urine: >1.03 — ABNORMAL HIGH (ref 1.005–1.030)
pH: 6 (ref 5.0–8.0)

## 2021-01-09 LAB — BLADDER SCAN AMB NON-IMAGING: Scan Result: 0

## 2021-01-09 MED ORDER — GEMTESA 75 MG PO TABS: 75.0000 mg | ORAL_TABLET | Freq: Every day | ORAL | 0 refills | Status: DC

## 2021-01-11 ENCOUNTER — Telehealth: Payer: Self-pay | Admitting: Family Medicine

## 2021-01-11 LAB — URINE CULTURE: Culture: >=100000 — AB

## 2021-01-11 NOTE — Telephone Encounter (Signed)
Patient notified and voiced understanding. Appointment has been scheduled. 

## 2021-01-11 NOTE — Telephone Encounter (Signed)
-----   Message from Nori Riis, PA-C sent at 01/11/2021  9:40 AM EST ----- Please let Mrs. [place] know that her urine culture grew out multiple species, so we will need for her to come in for a catheterized specimen for UA and culture.

## 2021-01-12 ENCOUNTER — Ambulatory Visit (INDEPENDENT_AMBULATORY_CARE_PROVIDER_SITE_OTHER): Payer: Medicare Other | Admitting: Urology

## 2021-01-12 ENCOUNTER — Other Ambulatory Visit: Payer: Self-pay

## 2021-01-12 ENCOUNTER — Encounter: Payer: Self-pay | Admitting: Urology

## 2021-01-12 VITALS — BP 150/85 | HR 64 | Ht 70.0 in | Wt 273.0 lb

## 2021-01-12 DIAGNOSIS — R300 Dysuria: Secondary | ICD-10-CM

## 2021-01-12 LAB — URINALYSIS, COMPLETE
Bilirubin, UA: NEGATIVE
Glucose, UA: NEGATIVE
Ketones, UA: NEGATIVE
Leukocytes,UA: NEGATIVE
Nitrite, UA: NEGATIVE
Protein,UA: NEGATIVE
RBC, UA: NEGATIVE
Specific Gravity, UA: 1.025 (ref 1.005–1.030)
Urobilinogen, Ur: 0.2 mg/dL (ref 0.2–1.0)
pH, UA: 5.5 (ref 5.0–7.5)

## 2021-01-12 LAB — MICROSCOPIC EXAMINATION
Bacteria, UA: NONE SEEN
RBC, Urine: NONE SEEN /hpf (ref 0–2)

## 2021-01-12 NOTE — Progress Notes (Signed)
In and Out Catheterization  Patient is present today for a I & O catheterization due to contaminated urine culture. Patient was cleaned and prepped in a sterile fashion with betadine . A 14 FR cath was inserted no complications were noted , 20 ml of urine return was noted, urine was yellow clear in color. A clean urine sample was collected for Betadine. Bladder was drained  And catheter was removed with out difficulty.    Performed by: Myself   Follow up/ Additional notes: Pending cultures

## 2021-01-17 LAB — CULTURE, URINE COMPREHENSIVE

## 2021-01-18 ENCOUNTER — Telehealth: Payer: Self-pay | Admitting: Family Medicine

## 2021-01-18 NOTE — Telephone Encounter (Signed)
-----   Message from Nori Riis, PA-C sent at 01/18/2021  8:20 AM EST ----- Please let Mrs. [place] know that her urine culture was negative.

## 2021-01-18 NOTE — Telephone Encounter (Signed)
Patient notified and voiced understanding.

## 2021-01-27 NOTE — Progress Notes (Signed)
Office Visit Note  Patient: Carrie Larsen             Date of Birth: [birth year]           MRN: [id number]             PCP: Rutherford [place], [place] Referring: Garrel Ridgel, [place] Visit Date: 02/09/2021 Occupation: @GUAROCC @  Subjective:  Pain in feet and ankles..   History of Present Illness: Carrie Larsen is a 75 y.o. female seen in consultation per request of Dr. Milinda Pointer.  According the patient her symptoms a started July 2020 with pain and discomfort in her bilateral toes.  At the time she was evaluated by Dr. Milinda Pointer.  At the time her uric acid was 10.6.  She was diagnosed with gout.  She was placed on colchicine and allopurinol.  She states the colchicine she was taking 3 times daily and it caused diarrhea.  She stopped the colchicine.  She also took her allopurinol intermittently.  She had cortisone injection in her toes x2 since the onset.  She states 4 months ago she started having pain and swelling in her bilateral ankles it was severe to the point she was crying.  She had cortisone injection to bilateral ankle joints which was helpful.  She states she has not taken allopurinol in the last 6 months.  She is taking colchicine as needed due to diarrhea.  She denies any discomfort currently.  She is done some dietary modifications and she is not eating any red meat or shellfish.  He does not consume any alcohol.  She has had about total 3 flares and the last flare was about 4 months ago.  None of the other joints are painful.  Activities of Daily Living:  Patient reports morning stiffness for 0 minutes.   Patient Denies nocturnal pain.  Difficulty dressing/grooming: Denies Difficulty climbing stairs: Denies Difficulty getting out of chair: Denies Difficulty using hands for taps, buttons, cutlery, and/or writing: Denies  Review of Systems  Constitutional: Negative for fatigue.  HENT: Negative for mouth sores, mouth dryness and nose dryness.   Eyes: Negative for pain, itching and dryness.   Respiratory: Negative for shortness of breath and difficulty breathing.   Cardiovascular: Negative for chest pain and palpitations.  Gastrointestinal: Positive for constipation and diarrhea. Negative for blood in stool.  Endocrine: Negative for increased urination.  Genitourinary: Positive for nocturia. Negative for difficulty urinating.  Musculoskeletal: Negative for arthralgias, joint pain, joint swelling, myalgias, morning stiffness, muscle tenderness and myalgias.  Skin: Negative for color change, rash and redness.  Allergic/Immunologic: Negative for susceptible to infections.  Neurological: Positive for dizziness. Negative for numbness, headaches, memory loss and weakness.  Hematological: Negative for bruising/bleeding tendency.  Psychiatric/Behavioral: Negative for confusion.    PMFS History:  Patient Active Problem List   Diagnosis Date Noted  . Personal history of kidney stones 12/25/2019  . 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 CME (cystoid macular edema), left 11/20/2018  . Nonexudative age-related macular degeneration, bilateral, early dry stage 11/20/2018  . Pseudophakia of both eyes 11/20/2018  . Olecranon bursitis 10/16/2018  . Contusion of knee 10/16/2018  . Arthritis of knee 10/16/2018  . Arthritis of hand 10/16/2018  . 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 Morbid obesity ([place]) 09/11/2017  . Non-STEMI (non-ST elevated myocardial infarction) ([place]) 06/27/2017  . Abdominal pain 02/28/2017  . History of renal cell carcinoma 02/20/2017  . Knee pain 08/14/2016  . Strain of hamstring muscle  08/08/2016  . Left leg swelling 08/22/2015  . Systolic murmur   . Retrosternal chest pain 07/08/2015  . History of colonic polyps 04/16/2015  . Chest pain 03/08/2011  . Hyperlipidemia 03/08/2011  . HTN (hypertension) 03/08/2011    Past Medical History:  Diagnosis Date  . Anxiety   . Arthritis    osteoarthritis  . CAD (coronary artery disease)    a.  2012 ETT: no ischemia;  b. 06/2017 NSTEMI/PCI: LM nl, LAD nl, D1 70-80p, LCX nl, OM1 90 (3.0 x 23 Xience [place]), RCA dominant, 100p/m, fills via L->R collats, EF 55-65%.  . Cancer [facility])    a. s/p partial left nephrectomy.  . Chicken pox   . CME (cystoid macular edema), left 11/20/2018  . Colon cancer ([place]) 1992   T3, N1, M0. colon   . Colon polyp 2011  . COVID-19 07/2019  . Diastolic dysfunction    a. 08/2015 Echo: EF 60-65%, no rwma, Gr1 DD, midlly dil LA, PASP 85mmHg. No significant valvular dzs; b. 06/2017 Echo: EF 60-65%, Gr1DD, mildly dil LA.  . Essential hypertension   . GERD (gastroesophageal reflux disease)   . Gout   . History of appendectomy 06/07/2015  . History of kidney cancer 11/20/2018  . History of kidney stones    passed - 2  . Hyperlipidemia   . Lumbar spinal stenosis   . Myocardial infarction ([place])    06/2017  . Nonexudative age-related macular degeneration, bilateral, early dry stage 11/20/2018  . Obesity   . Prediabetes   . Pseudophakia of both eyes   . Retinal cyst     Family History  Problem Relation Age of Onset  . Colon cancer Mother   . Cerebral aneurysm Father   . Heart attack Father   . Heart attack Brother 45  . Healthy Son    Past Surgical History:  Procedure Laterality Date  . ABDOMINAL HYSTERECTOMY    . APPENDECTOMY  06/07/2015  . CARDIAC CATHETERIZATION    . CATARACT EXTRACTION W/PHACO Left 04/24/2016   Procedure: CATARACT EXTRACTION PHACO AND INTRAOCULAR LENS PLACEMENT (IOC);  Surgeon: Birder Robson, MD;  Location: [facility] ORS;  Service: Ophthalmology;  Laterality: Left;  [place] 45.8 AP% 16.4 CDE 7.53 FLUID PACK LOT # [id number]  . CATARACT EXTRACTION W/PHACO Right 06/05/2016   Procedure: CATARACT EXTRACTION PHACO AND INTRAOCULAR LENS PLACEMENT (IOC);  Surgeon: Birder Robson, MD;  Location: [facility] ORS;  Service: Ophthalmology;  Laterality: Right;  [place] 25.9 AP% 21.9 CDE 5.68 Fluid pack lot # [id number]  . COLON RESECTION  03/04/1991  . COLON  SURGERY    . COLONOSCOPY  03-14-10   Dr Bary Castilla, tubular adenoma at 25 cm.  . COLONOSCOPY WITH PROPOFOL N/A 06/07/2015   Procedure: COLONOSCOPY WITH PROPOFOL;  Surgeon: Robert Bellow, MD;  Location[facility] ENDOSCOPY;  Service: Endoscopy;  Laterality: N/A;  . COLONOSCOPY WITH PROPOFOL N/A 08/10/2020   Procedure: COLONOSCOPY WITH PROPOFOL;  Surgeon: Robert Bellow, MD;  Location: [facility] ENDOSCOPY;  Service: Endoscopy;  Laterality: N/A;  . CORONARY STENT INTERVENTION N/A 06/28/2017   Procedure: CORONARY STENT INTERVENTION;  Surgeon: Yolonda Kida, MD;  Location: [place] CV LAB;  Service: Cardiovascular;  Laterality: N/A;  . EYE SURGERY     lens eye surgery  . HERNIA REPAIR  [id number]   umbilical  . LEFT HEART CATH AND CORONARY ANGIOGRAPHY N/A 06/28/2017   Procedure: LEFT HEART CATH AND CORONARY ANGIOGRAPHY;  Surgeon: Minna Merritts, MD;  Location: [place] CV LAB;  Service: Cardiovascular;  Laterality: N/A;  . LUMBAR LAMINECTOMY/DECOMPRESSION MICRODISCECTOMY Bilateral 03/10/2018   Procedure: Laminectomy and Foraminotomy - Lumbar three-Lumbar four - Lumbar four-Lumbar five - bilateral;  Surgeon: Earnie Larsson, MD;  Location: [facility];  Service: Neurosurgery;  Laterality: Bilateral;  . NEPHRECTOMY  1992   left- cancer  . RIB RESECTION     removal 4 ribs  . TONSILLECTOMY     Social History   Social History Narrative  . Not on file   Immunization History  Administered Date(s) Administered  . Influenza, High Dose Seasonal PF 09/20/2018  . Moderna Sars-Covid-2 Vaccination 06/21/2020, 09/06/2020  . Pneumococcal Polysaccharide-23 06/29/2017     Objective: Vital Signs: BP (!) 155/87 (BP Location: Right Arm, Patient Position: Sitting, Cuff Size: Normal)   Pulse 60   Resp 16   Ht 5' 7.5" (1.715 m)   Wt 272 lb (123.4 kg)   BMI 41.97 kg/m    Physical Exam Vitals and nursing note reviewed.  Constitutional:      Appearance: She is well-developed.  HENT:     Head: Normocephalic and  atraumatic.  Eyes:     Conjunctiva/sclera: Conjunctivae normal.  Cardiovascular:     Rate and Rhythm: Normal rate and regular rhythm.     Heart sounds: Normal heart sounds.  Pulmonary:     Effort: Pulmonary effort is normal.     Breath sounds: Normal breath sounds.  Abdominal:     General: Bowel sounds are normal.     Palpations: Abdomen is soft.  Musculoskeletal:     Cervical back: Normal range of motion.  Lymphadenopathy:     Cervical: No cervical adenopathy.  Skin:    General: Skin is warm and dry.     Capillary Refill: Capillary refill takes less than 2 seconds.  Neurological:     Mental Status: She is alert and oriented to person, place, and time.  Psychiatric:        Behavior: Behavior normal.      Musculoskeletal Exam: C-spine was in good range of motion.  She has limited range of motion of her lumbar spine.  Shoulder joints, elbow joints, wrist joints with good range of motion.  She had bilateral PIP and DIP thickening with no synovitis.  Hip joints and knee joints with good range of motion.  She had no warmth or swelling over ankle joints.  She had bilateral first MTP prominence and PIP and DIP thickening consistent with osteoarthritis.  No synovitis was noted.  CDAI Exam: CDAI Score: - Patient Global: -; Provider Global: - Swollen: -; Tender: - Joint Exam 02/09/2021   No joint exam has been documented for this visit   There is currently no information documented on the homunculus. Go to the Rheumatology activity and complete the homunculus joint exam.  Investigation: No additional findings.  Imaging: US RENAL  Result Date: 02/06/2021 CLINICAL DATA:  Follow-up renal cyst. EXAM: RENAL / URINARY TRACT ULTRASOUND COMPLETE COMPARISON:  Ultrasound 12/15/2019 FINDINGS: Right Kidney: Renal measurements: 13.0 x 6.8 x 6.6 cm = volume: 306 mL. 3.7 x 3.5 x 4.2 cm simple cyst projecting off the lower pole region of the kidney. No significant change when compared to prior  ultrasound examinations and CT scan from 2018. Left Kidney: Surgically absent. Bladder: Appears normal for degree of bladder distention. Other: None. IMPRESSION: 1. Status post left nephrectomy. 2. Stable simple appearing lower pole right renal cyst. No worrisome right renal lesions or hydronephrosis. Electronically Signed   By: Marijo Sanes M.D.   On: 02/06/2021  08:46    Recent Labs: Lab Results  Component Value Date   WBC 6.9 09/21/2020   HGB 14.3 09/21/2020   PLT 241 09/21/2020   NA 139 09/21/2020   K 4.3 09/21/2020   CL 106 09/21/2020   CO2 24 09/21/2020   GLUCOSE 114 (H) 09/21/2020   BUN 21 09/21/2020   CREATININE 0.81 09/21/2020   BILITOT 0.4 06/30/2020   ALKPHOS 148 (H) 06/30/2020   AST 18 06/30/2020   ALT 22 06/30/2020   PROT 6.8 06/30/2020   ALBUMIN 4.5 06/30/2020   CALCIUM 9.8 09/21/2020   GFRAA 75 06/30/2020    Speciality Comments: No specialty comments available.  Procedures:  No procedures performed Allergies: Morphine and related, Prednisone, and Latex   Assessment / Plan:     Visit Diagnoses: Idiopathic chronic gout of multiple sites without tophus - Uric acid 6.2 on 06/30/20. Colchicine 0.6 mg 1 tablet daily -patient gives history of gout for the last 1 year.  She has taken colchicine 3 times daily in the past which caused diarrhea and she discontinued.  She has taken allopurinol only intermittently and has not taken any in the last 6 months.  Detailed counseling guarding gout was provided.  Management of gout was also discussed at length.  I discussed the goal will be to keep her uric acid below 6.  After detailed discussion I advised her to start on colchicine 0.6 mg p.o. daily.  Four days after starting colchicine she will add allopurinol 100 mg p.o. daily.  After taking allopurinol 1 tablet for 2 weeks she will increase it to 2 tablets daily.  She is advised to come in for lab work after 1 month of starting allopurinol.  If her labs are normal the dose of  allopurinol will be increased to 300 mg p.o. daily.  She states she has medications at home.  I will check uric acid levels today.  She had no synovitis on examination today.  Plan: Uric acid  Medication monitoring encounter - Plan: CBC with Differential/Platelet, COMPLETE METABOLIC PANEL WITH GFR  Primary osteoarthritis of both hands-she has bilateral PIP and DIP thickening consistent with osteoarthritis.  Joint protection was discussed.  Pain in both feet -she complains of discomfort in her feet.  Clinical findings are consistent with osteoarthritis.  She has also had gout flares and bilateral first MTP joints.  She has had cortisone injections in the past by Dr. Milinda Pointer.  Plan: Rheumatoid factor, Cyclic citrul peptide antibody, IgG, Sedimentation rate, XR Foot 2 Views Right, XR Foot 2 Views Left.  X-ray showed bilateral osteoarthritic changes.  Primary osteoarthritis of both feet-clinical findings are consistent with osteoarthritis.  Proper fitting shoes were advised.  Chronic pain of both ankles-she had a flare of gout in her bilateral ankles about 4 months ago which responded to cortisone injections.  I will also obtain labs including rheumatoid factor and anti-CCP with sed rate.  Personal history of kidney stones-she has not had a kidney stone in a long time.  She is uncertain about the analysis of kidney stone.  Lumbar stenosis with neurogenic claudication - Followed by Dr. Trenton Gammon.  Status post discectomy 2019.  She continues to have some lower back pain.  Primary hypertension-her blood pressure was elevated today.  Have advised her to monitor blood pressure closely and follow-up with her PCP.  Other medical problems are listed as follows:  Non-STEMI (non-ST elevated myocardial infarction) ([place])  History of hyperlipidemia  Systolic murmur  History of renal cell carcinoma -  1992 status post left nephrectomy.  Followed at [facility] urology.  History of colonic polyps  Choroidal  nevus, right eye  CME (cystoid macular edema), left  Pseudophakia of both eyes  Nonexudative age-related macular degeneration, bilateral, early dry stage  Orders: Orders Placed This Encounter  Procedures  . XR Foot 2 Views Right  . XR Foot 2 Views Left  . CBC with Differential/Platelet  . COMPLETE METABOLIC PANEL WITH GFR  . Uric acid  . Rheumatoid factor  . Cyclic citrul peptide antibody, IgG  . Sedimentation rate   No orders of the defined types were placed in this encounter.    Follow-Up Instructions: Return for Osteoarthritis, Gout.   Bo Merino, MD  Note - This record has been created using Editor, commissioning.  Chart creation errors have been sought, but may not always  have been located. Such creation errors do not reflect on  the standard of medical care.

## 2021-01-28 NOTE — Progress Notes (Deleted)
01/30/2021 4:48 PM   [place] [birth year] [id number]  Referring provider: Gennette Pac, [place],  [place] [address]  No chief complaint on file.  Urological history: 1. Renal cell carcinoma - s/p left nephrectomy ~27 years ago for RCC  2. Nephrolithiasis - no documented stones > 10 years  HPI: Carrie Larsen is a 75 y.o. female who presents today for a yearly follow up.    She has developed nocturia x 4-5 a night and filling up incontinence pads.  This has been going on for the last 6 months.  She sleeps alone, so she does not know if she is snoring or having apneic episodes.    She has tried restricting fluids after 5 pm, but that has not helped.  She denies any pedal edema.    She is not having daytime frequency or urgency.    Her PVR is 0 mL.    UA with 21-50 WBC's, rare bacteria and WBC clumps.   PMH: Past Medical History:  Diagnosis Date  . Anxiety   . Arthritis    osteoarthritis  . CAD (coronary artery disease)    a. 2012 ETT: no ischemia;  b. 06/2017 NSTEMI/PCI: LM nl, LAD nl, D1 70-80p, LCX nl, OM1 90 (3.0 x 23 Xience [place]), RCA dominant, 100p/m, fills via L->R collats, EF 55-65%.  . Cancer [facility])    a. s/p partial left nephrectomy.  . Chicken pox   . CME (cystoid macular edema), left 11/20/2018  . Colon cancer ([place]) 1992   T3, N1, M0. colon   . Colon polyp 2011  . COVID-19 07/2019  . Diastolic dysfunction    a. 08/2015 Echo: EF 60-65%, no rwma, Gr1 DD, midlly dil LA, PASP 54mmHg. No significant valvular dzs; b. 06/2017 Echo: EF 60-65%, Gr1DD, mildly dil LA.  . Essential hypertension   . GERD (gastroesophageal reflux disease)   . Gout   . History of appendectomy 06/07/2015  . History of kidney cancer 11/20/2018  . History of kidney stones    passed - 2  . Hyperlipidemia   . Lumbar spinal stenosis   . Myocardial infarction ([place])    06/2017  . Nonexudative age-related macular degeneration, bilateral, early dry stage 11/20/2018   . Obesity   . Prediabetes   . Pseudophakia of both eyes   . Retinal cyst     Surgical History: Past Surgical History:  Procedure Laterality Date  . ABDOMINAL HYSTERECTOMY    . APPENDECTOMY  06/07/2015  . CARDIAC CATHETERIZATION    . CATARACT EXTRACTION W/PHACO Left 04/24/2016   Procedure: CATARACT EXTRACTION PHACO AND INTRAOCULAR LENS PLACEMENT (IOC);  Surgeon: Birder Robson, MD;  Location: [facility] ORS;  Service: Ophthalmology;  Laterality: Left;  [place] 45.8 AP% 16.4 CDE 7.53 FLUID PACK LOT # [id number]  . CATARACT EXTRACTION W/PHACO Right 06/05/2016   Procedure: CATARACT EXTRACTION PHACO AND INTRAOCULAR LENS PLACEMENT (IOC);  Surgeon: Birder Robson, MD;  Location: [facility] ORS;  Service: Ophthalmology;  Laterality: Right;  [place] 25.9 AP% 21.9 CDE 5.68 Fluid pack lot # [id number]  . COLON RESECTION  03/04/1991  . COLON SURGERY    . COLONOSCOPY  03-14-10   Dr Bary Castilla, tubular adenoma at 25 cm.  . COLONOSCOPY WITH PROPOFOL N/A 06/07/2015   Procedure: COLONOSCOPY WITH PROPOFOL;  Surgeon: Robert Bellow, MD;  Location: [facility]COPY;  Service: Endoscopy;  Laterality: N/A;  . COLONOSCOPY WITH PROPOFOL N/A 08/10/2020   Procedure: COLONOSCOPY WITH PROPOFOL;  Surgeon: Robert Bellow, MD;  Location: [facility] ENDOSCOPY;  Service: Endoscopy;  Laterality: N/A;  . CORONARY STENT INTERVENTION N/A 06/28/2017   Procedure: CORONARY STENT INTERVENTION;  Surgeon: Yolonda Kida, MD;  Location: [place] CV LAB;  Service: Cardiovascular;  Laterality: N/A;  . EYE SURGERY     lens eye surgery  . HERNIA REPAIR  [id number]   umbilical  . LEFT HEART CATH AND CORONARY ANGIOGRAPHY N/A 06/28/2017   Procedure: LEFT HEART CATH AND CORONARY ANGIOGRAPHY;  Surgeon: Minna Merritts, MD;  Location: [place] CV LAB;  Service: Cardiovascular;  Laterality: N/A;  . LUMBAR LAMINECTOMY/DECOMPRESSION MICRODISCECTOMY Bilateral 03/10/2018   Procedure: Laminectomy and Foraminotomy - Lumbar three-Lumbar four - Lumbar four-Lumbar  five - bilateral;  Surgeon: Earnie Larsson, MD;  Location: [place];  Service: Neurosurgery;  Laterality: Bilateral;  . NEPHRECTOMY  1992   left- cancer  . RIB RESECTION     removal 4 ribs  . TONSILLECTOMY      Home Medications:  Allergies as of 01/30/2021      Reactions   Morphine And Related Nausea Only   Prednisone Nausea And Vomiting   Latex Rash      Medication List       Accurate as of January 28, 2021  4:48 PM. If you have any questions, ask your nurse or doctor.        aspirin EC 81 MG tablet Take 81 mg by mouth daily. Swallow whole.   atorvastatin 80 MG tablet Commonly known as: LIPITOR Take 1 tablet (80 mg total) by mouth daily.   carvedilol 12.5 MG tablet Commonly known as: COREG Take 1.5 tablets (18.75 mg total) by mouth 2 (two) times daily with a meal.   clopidogrel 75 MG tablet Commonly known as: PLAVIX Take 1 tablet (75 mg total) by mouth daily.   colchicine 0.6 MG tablet TAKE 1 TABLET BY MOUTH THREE TIMES DAILY UNTIL GI UPSET OR FLARE SUBSIDES, THEN FOR MAINTENANCE TAKE 1 TABLET ONCE DAILY   esomeprazole 40 MG capsule Commonly known as: NEXIUM Take 40 mg by mouth daily in the afternoon.   Gemtesa 75 MG Tabs Generic drug: Vibegron Take 75 mg by mouth daily.   isosorbide mononitrate 30 MG 24 hr tablet Commonly known as: IMDUR Take 1 tablet (30 mg total) by mouth in the morning and at bedtime.   lidocaine 5 % Commonly known as: LIDODERM Place 1 patch onto the skin daily. Remove & Discard patch within 12 hours or as directed by MD   multivitamin with minerals tablet Take 1 tablet by mouth daily.   nitroGLYCERIN 0.4 MG SL tablet Commonly known as: NITROSTAT Place 1 tablet (0.4 mg total) under the tongue every 5 (five) minutes x 3 doses as needed for chest pain.   pregabalin 75 MG capsule Commonly known as: Lyrica Take 1 capsule (75 mg total) by mouth 2 (two) times daily.   traMADol 50 MG tablet Commonly known as: ULTRAM Take 50 mg by mouth  daily.       Allergies:  Allergies  Allergen Reactions  . Morphine And Related Nausea Only  . Prednisone Nausea And Vomiting  . Latex Rash    Family History: Family History  Problem Relation Age of Onset  . Colon cancer Mother   . Cerebral aneurysm Father   . Heart attack Father   . Heart attack Brother 33    Social History:  reports that she has never smoked. She has never used smokeless tobacco. She reports that she does not drink  alcohol and does not use drugs.  ROS: Pertinent ROS in HPI  Physical Exam: There were no vitals taken for this visit.  Constitutional:  Well nourished. Alert and oriented, No acute distress. HEENT: [place] AT, mask in place.  Trachea midline, no masses. Cardiovascular: No clubbing, cyanosis, or edema. Respiratory: Normal respiratory effort, no increased work of breathing. Neurologic: Grossly intact, no focal deficits, moving all 4 extremities. Psychiatric: Normal mood and affect.   Laboratory Data: Lab Results  Component Value Date   WBC 6.9 09/21/2020   HGB 14.3 09/21/2020   HCT 43.3 09/21/2020   MCV 88.4 09/21/2020   PLT 241 09/21/2020    Lab Results  Component Value Date   CREATININE 0.81 09/21/2020    Lab Results  Component Value Date   AST 18 06/30/2020   Lab Results  Component Value Date   ALT 22 06/30/2020    Urinalysis Component     Latest Ref Rng & Units 01/09/2021  Color, Urine     YELLOW YELLOW  Appearance     CLEAR HAZY (A)  Specific Gravity, Urine     1.005 - 1.030 >1.030 (H)  pH     5.0 - 8.0 6.0  Glucose, UA     NEGATIVE mg/dL NEGATIVE  Hgb urine dipstick     NEGATIVE TRACE (A)  Bilirubin Urine     NEGATIVE NEGATIVE  Ketones, ur     NEGATIVE mg/dL NEGATIVE  Protein     NEGATIVE mg/dL NEGATIVE  Nitrite     NEGATIVE NEGATIVE  Leukocytes,Ua     NEGATIVE MODERATE (A)  Squamous Epithelial / LPF     0 - 5 6-10  WBC, UA     0 - 5 WBC/hpf 21-50  RBC / HPF     0 - 5 RBC/hpf 0-5  Bacteria, UA      NONE SEEN RARE (A)  WBC Clumps      PRESENT  I have reviewed the labs.   Pertinent Imaging: Results for YVONE, [facility] (MRN [id number]) as of 01/09/2021 15:11  Ref. 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 Nephrolithiasis - asymptomatic  3. Nocturia - I explained to the patient that nocturia is often multi-factorial and difficult to treat.  Sleeping disorders, heart conditions, peripheral vascular disease, diabetes, an enlarged prostate for men, an urethral stricture causing bladder outlet obstruction and/or certain medications can contribute to nocturia. - I have explained that research studies have showed that over 84% of patients with sleep apnea reported frequent nighttime urination.   With sleep apnea, oxygen decreases, carbon dioxide increases, the blood become more acidic, the heart rate drops and blood vessels in the lung constrict.  The body is then alerted that something is very wrong. The sleeper must wake enough to reopen the airway. By this time, the heart is racing and experiences a false signal of fluid overload. The heart excretes a hormone-like protein that tells the body to get rid of sodium and water, resulting in nocturia. - There is also an increased incidence in sleep apnea with menopause, symptoms include night sweats, daytime sleepiness, depressed mood, and cognitive complaints like poor concentration or problems with short-term memory  - The patient may benefit from a discussion with his or her primary care physician to see if he or she has risk factors for sleep apnea or other sleep disturbances and obtaining a sleep  study. - Gemtesa 75 mg, # 28 samples given  - UA with WBC clumps, so will send for culture to see if an UTI is contributing to her nocturia  No follow-ups on file.  These notes generated with voice recognition software. I apologize for typographical errors.  Zara Council,  PA-C  [facility] [address]  [place], [place] [address] [phone]

## 2021-01-30 ENCOUNTER — Telehealth: Payer: Self-pay | Admitting: *Deleted

## 2021-01-30 ENCOUNTER — Ambulatory Visit: Payer: Medicare Other | Admitting: Urology

## 2021-01-30 DIAGNOSIS — R351 Nocturia: Secondary | ICD-10-CM

## 2021-01-30 NOTE — Telephone Encounter (Signed)
.  left message to have patient return my call.  Pt needed to get RUS before her visit today, left message to have pt call back.

## 2021-02-02 ENCOUNTER — Other Ambulatory Visit: Payer: Self-pay | Admitting: Neurosurgery

## 2021-02-02 DIAGNOSIS — M48062 Spinal stenosis, lumbar region with neurogenic claudication: Secondary | ICD-10-CM

## 2021-02-03 ENCOUNTER — Ambulatory Visit
Admission: RE | Admit: 2021-02-03 | Discharge: 2021-02-03 | Disposition: A | Discharge status: Home / Self Care | Payer: Medicare Other | Source: Ambulatory Visit | Attending: Urology | Admitting: Urology

## 2021-02-03 ENCOUNTER — Other Ambulatory Visit: Payer: Self-pay

## 2021-02-03 DIAGNOSIS — Z905 Acquired absence of kidney: Secondary | ICD-10-CM | POA: Insufficient documentation

## 2021-02-03 DIAGNOSIS — N200 Calculus of kidney: Secondary | ICD-10-CM | POA: Diagnosis present

## 2021-02-03 DIAGNOSIS — N281 Cyst of kidney, acquired: Secondary | ICD-10-CM | POA: Diagnosis present

## 2021-02-05 NOTE — Progress Notes (Signed)
02/06/2021 3:45 PM   [name] 05-Mar-1946 [id number]  Referring provider: Gennette Pac, [place],  [place] [address] Chief Complaint  Patient presents with  . Follow-up    Discuss RUS    Urological history: 1. Renal cell carcinoma - s/pleft nephrectomy ~27 years agofor RCC  2. Nephrolithiasis - no documented stones > 10 years  HPI: Carrie Larsen is a 75 y.o. female who returns for a 1 month follow up and symptoms recheck.   Last time the patient reported nocturia x 4-5 nightly and filling up incontinence pads. Symptoms had been ongoing for 6 months. Patient is unaware if she snored or had apneic episodes.   She was tired of restricting fluids after 5 pm, but this did not help. Denied any pedal edema. Denied any daytime frequency or urgency. PVR was 0 mL. UA showed 21-50 WBC's, rare bacteria and WBC clumps.   RUS on 02/03/2021 noted s/p left nephrectomy. Stable simple appearing lower pole right renal cyst. No worrisome right renal lesions or hydronephrosis.  Patient presents today with kidney and back pain, and difficulty walking due to this pain. 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 Patient reports having trouble walking and relies on a cane, with a plan to purchase a Rolator today. Patient also thought that after her upcomming back surgery, her nocturia might improve.  Patient reports that she was having an insurance issues for paying for the [place], so she has not been able to take it yet. She reports that she has been having difficulty walking, because of the pain. Patient was visibly upset about the situation.   PMH: Past Medical History:  Diagnosis Date  . Anxiety   . Arthritis    osteoarthritis  . CAD (coronary artery disease)    a. 2012 ETT: no ischemia;  b. 06/2017 NSTEMI/PCI: LM nl, LAD nl, D1 70-80p, LCX nl, OM1 90 (3.0 x 23 Xience [place]), RCA dominant,  100p/m, fills via L->R collats, EF 55-65%.  . Cancer [facility])    a. s/p partial left nephrectomy.  . Chicken pox   . CME (cystoid macular edema), left 11/20/2018  . Colon cancer ([place]) 1992   T3, N1, M0. colon   . Colon polyp 2011  . COVID-19 07/2019  . Diastolic dysfunction    a. 08/2015 Echo: EF 60-65%, no rwma, Gr1 DD, midlly dil LA, PASP 49mmHg. No significant valvular dzs; b. 06/2017 Echo: EF 60-65%, Gr1DD, mildly dil LA.  . Essential hypertension   . GERD (gastroesophageal reflux disease)   . Gout   . History of appendectomy 06/07/2015  . History of kidney cancer 11/20/2018  . History of kidney stones    passed - 2  . Hyperlipidemia   . Lumbar spinal stenosis   . Myocardial infarction ([place])    06/2017  . Nonexudative age-related macular degeneration, bilateral, early dry stage 11/20/2018  . Obesity   . Prediabetes   . Pseudophakia of both eyes   . Retinal cyst     Surgical History: Past Surgical History:  Procedure Laterality Date  . ABDOMINAL HYSTERECTOMY    . APPENDECTOMY  06/07/2015  . CARDIAC CATHETERIZATION    . CATARACT EXTRACTION W/PHACO Left 04/24/2016   Procedure: CATARACT EXTRACTION PHACO AND INTRAOCULAR LENS PLACEMENT (IOC);  Surgeon: Birder Robson, MD;  Location: [facility] ORS;  Service: Ophthalmology;  Laterality: Left;  [place] 45.8 AP% 16.4 CDE 7.53 FLUID PACK  LOT # [id number]  . CATARACT EXTRACTION W/PHACO Right 06/05/2016   Procedure: CATARACT EXTRACTION PHACO AND INTRAOCULAR LENS PLACEMENT (IOC);  Surgeon: Birder Robson, MD;  Location: [facility] ORS;  Service: Ophthalmology;  Laterality: Right;  [place] 25.9 AP% 21.9 CDE 5.68 Fluid pack lot # [id number]  . COLON RESECTION  03/04/1991  . COLON SURGERY    . COLONOSCOPY  03-14-10   Dr Bary Castilla, tubular adenoma at 25 cm.  . COLONOSCOPY WITH PROPOFOL N/A 06/07/2015   Procedure: COLONOSCOPY WITH PROPOFOL;  Surgeon: Robert Bellow, MD;  Location: [facility] ENDOSCOPY;  Service: Endoscopy;  Laterality: N/A;  . COLONOSCOPY WITH  PROPOFOL N/A 08/10/2020   Procedure: COLONOSCOPY WITH PROPOFOL;  Surgeon: Robert Bellow, MD;  Location: [facility] ENDOSCOPY;  Service: Endoscopy;  Laterality: N/A;  . CORONARY STENT INTERVENTION N/A 06/28/2017   Procedure: CORONARY STENT INTERVENTION;  Surgeon: Yolonda Kida, MD;  Location: [place] CV LAB;  Service: Cardiovascular;  Laterality: N/A;  . EYE SURGERY     lens eye surgery  . HERNIA REPAIR  1660   umbilical  . LEFT HEART CATH AND CORONARY ANGIOGRAPHY N/A 06/28/2017   Procedure: LEFT HEART CATH AND CORONARY ANGIOGRAPHY;  Surgeon: Minna Merritts, MD;  Location: [place] CV LAB;  Service: Cardiovascular;  Laterality: N/A;  . LUMBAR LAMINECTOMY/DECOMPRESSION MICRODISCECTOMY Bilateral 03/10/2018   Procedure: Laminectomy and Foraminotomy - Lumbar three-Lumbar four - Lumbar four-Lumbar five - bilateral;  Surgeon: Earnie Larsson, MD;  Location: [place];  Service: Neurosurgery;  Laterality: Bilateral;  . NEPHRECTOMY  1992   left- cancer  . RIB RESECTION     removal 4 ribs  . TONSILLECTOMY      Home Medications:  Allergies as of 02/06/2021      Reactions   Morphine And Related Nausea Only   Prednisone Nausea And Vomiting   Latex Rash      Medication List       Accurate as of February 06, 2021  3:45 PM. If you have any questions, ask your nurse or doctor.        STOP taking these medications   pregabalin 75 MG capsule Commonly known as: Lyrica Stopped by: Zara Council, PA-C     TAKE these medications   aspirin EC 81 MG tablet Take 81 mg by mouth daily. Swallow whole.   atorvastatin 80 MG tablet Commonly known as: LIPITOR Take 1 tablet (80 mg total) by mouth daily.   carvedilol 12.5 MG tablet Commonly known as: COREG Take 1.5 tablets (18.75 mg total) by mouth 2 (two) times daily with a meal.   clopidogrel 75 MG tablet Commonly known as: PLAVIX Take 1 tablet (75 mg total) by mouth daily.   colchicine 0.6 MG tablet TAKE 1 TABLET BY MOUTH THREE TIMES DAILY  UNTIL GI UPSET OR FLARE SUBSIDES, THEN FOR MAINTENANCE TAKE 1 TABLET ONCE DAILY   esomeprazole 40 MG capsule Commonly known as: NEXIUM Take 40 mg by mouth daily in the afternoon.   Gemtesa 75 MG Tabs Generic drug: Vibegron Take 75 mg by mouth daily.   isosorbide mononitrate 30 MG 24 hr tablet Commonly known as: IMDUR Take 1 tablet (30 mg total) by mouth in the morning and at bedtime.   lidocaine 5 % Commonly known as: LIDODERM Place 1 patch onto the skin daily. Remove & Discard patch within 12 hours or as directed by MD   multivitamin with minerals tablet Take 1 tablet by mouth daily.   nitroGLYCERIN 0.4 MG SL tablet Commonly known as:  NITROSTAT Place 1 tablet (0.4 mg total) under the tongue every 5 (five) minutes x 3 doses as needed for chest pain.   traMADol 50 MG tablet Commonly known as: ULTRAM Take 50 mg by mouth daily.       Allergies:  Allergies  Allergen Reactions  . Morphine And Related Nausea Only  . Prednisone Nausea And Vomiting  . Latex Rash    Family History: Family History  Problem Relation Age of Onset  . Colon cancer Mother   . Cerebral aneurysm Father   . Heart attack Father   . Heart attack Brother 18    Social History:  reports that she has never smoked. She has never used smokeless tobacco. She reports that she does not drink alcohol and does not use drugs.   Physical Exam: BP (!) 170/101   Pulse 80   Ht 5\' 9"  (1.753 m)   Wt 273 lb (123.8 kg)   BMI 40.32 kg/m   Constitutional:  Well nourished. Alert and oriented, No acute distress. HEENT: [place] AT, mask in place.  Trachea midline Cardiovascular: No clubbing, cyanosis, or edema. Respiratory: Normal respiratory effort, no increased work of breathing. Neurologic: Grossly intact, no focal deficits, moving all 4 extremities. Psychiatric: Normal mood and affect.   Laboratory Data: No new laboratory data since last visit   Pertinent Imaging: CLINICAL DATA:  Follow-up renal  cyst.  EXAM: RENAL / URINARY TRACT ULTRASOUND COMPLETE  COMPARISON:  Ultrasound 12/15/2019  FINDINGS: Right Kidney:  Renal measurements: 13.0 x 6.8 x 6.6 cm = volume: 306 mL. 3.7 x 3.5 x 4.2 cm simple cyst projecting off the lower pole region of the kidney. No significant change when compared to prior ultrasound examinations and CT scan from 2018.  Left Kidney:  Surgically absent.  Bladder:  Appears normal for degree of bladder distention.  Other:  None.  IMPRESSION: 1. Status post left nephrectomy. 2. Stable simple appearing lower pole right renal cyst. No worrisome right renal lesions or hydronephrosis.   Electronically Signed By: Marijo Sanes M.D. On: 02/06/2021 08:46 I have personally reviewed the images and agree with radiologist interpretation.  See HPI.    Assessment & Plan:    1. Solitary Kidney - unremarkable, need to RTC for RUS in a year  2. Nephrolithiasis  - remains asymptomatic - no stones seen on RUS   3. Nocturia  - Discontinued Gemtesa use due to insurance coverage issues.  Return in about 1 year (around 02/06/2022) for RUS and office visit .  I, Ardyth Gal, am acting as a Education administrator for [employer],  I have reviewed the above documentation for accuracy and completeness, and I agree with the above.    Zara Council, PA-C   [facility] [address], [place], [place] [address] [phone]

## 2021-02-06 ENCOUNTER — Ambulatory Visit (INDEPENDENT_AMBULATORY_CARE_PROVIDER_SITE_OTHER): Payer: Medicare Other | Admitting: Urology

## 2021-02-06 ENCOUNTER — Other Ambulatory Visit: Payer: Self-pay

## 2021-02-06 ENCOUNTER — Encounter: Payer: Self-pay | Admitting: Urology

## 2021-02-06 VITALS — BP 170/101 | HR 80 | Ht 69.0 in | Wt 273.0 lb

## 2021-02-06 DIAGNOSIS — Z905 Acquired absence of kidney: Secondary | ICD-10-CM

## 2021-02-06 DIAGNOSIS — N200 Calculus of kidney: Secondary | ICD-10-CM

## 2021-02-06 DIAGNOSIS — R351 Nocturia: Secondary | ICD-10-CM

## 2021-02-09 ENCOUNTER — Ambulatory Visit: Payer: Self-pay

## 2021-02-09 ENCOUNTER — Encounter: Payer: Self-pay | Admitting: Rheumatology

## 2021-02-09 ENCOUNTER — Other Ambulatory Visit: Payer: Self-pay

## 2021-02-09 ENCOUNTER — Ambulatory Visit (INDEPENDENT_AMBULATORY_CARE_PROVIDER_SITE_OTHER): Payer: Medicare Other | Admitting: Rheumatology

## 2021-02-09 VITALS — BP 155/87 | HR 60 | Resp 16 | Ht 67.5 in | Wt 272.0 lb

## 2021-02-09 DIAGNOSIS — M19071 Primary osteoarthritis, right ankle and foot: Secondary | ICD-10-CM

## 2021-02-09 DIAGNOSIS — Z961 Presence of intraocular lens: Secondary | ICD-10-CM

## 2021-02-09 DIAGNOSIS — M79671 Pain in right foot: Secondary | ICD-10-CM | POA: Diagnosis not present

## 2021-02-09 DIAGNOSIS — M19041 Primary osteoarthritis, right hand: Secondary | ICD-10-CM

## 2021-02-09 DIAGNOSIS — Z8639 Personal history of other endocrine, nutritional and metabolic disease: Secondary | ICD-10-CM

## 2021-02-09 DIAGNOSIS — M79672 Pain in left foot: Secondary | ICD-10-CM | POA: Diagnosis not present

## 2021-02-09 DIAGNOSIS — R011 Cardiac murmur, unspecified: Secondary | ICD-10-CM

## 2021-02-09 DIAGNOSIS — Z5181 Encounter for therapeutic drug level monitoring: Secondary | ICD-10-CM | POA: Diagnosis not present

## 2021-02-09 DIAGNOSIS — M25571 Pain in right ankle and joints of right foot: Secondary | ICD-10-CM

## 2021-02-09 DIAGNOSIS — G8929 Other chronic pain: Secondary | ICD-10-CM

## 2021-02-09 DIAGNOSIS — M19072 Primary osteoarthritis, left ankle and foot: Secondary | ICD-10-CM

## 2021-02-09 DIAGNOSIS — M1A0790 Idiopathic chronic gout, unspecified ankle and foot, without tophus (tophi): Secondary | ICD-10-CM

## 2021-02-09 DIAGNOSIS — M25572 Pain in left ankle and joints of left foot: Secondary | ICD-10-CM

## 2021-02-09 DIAGNOSIS — Z860100 Personal history of colon polyps, unspecified: Secondary | ICD-10-CM

## 2021-02-09 DIAGNOSIS — M1A09X0 Idiopathic chronic gout, multiple sites, without tophus (tophi): Secondary | ICD-10-CM | POA: Diagnosis not present

## 2021-02-09 DIAGNOSIS — Z87442 Personal history of urinary calculi: Secondary | ICD-10-CM

## 2021-02-09 DIAGNOSIS — M48062 Spinal stenosis, lumbar region with neurogenic claudication: Secondary | ICD-10-CM

## 2021-02-09 DIAGNOSIS — M19042 Primary osteoarthritis, left hand: Secondary | ICD-10-CM

## 2021-02-09 DIAGNOSIS — I214 Non-ST elevation (NSTEMI) myocardial infarction: Secondary | ICD-10-CM | POA: Diagnosis not present

## 2021-02-09 DIAGNOSIS — D3131 Benign neoplasm of right choroid: Secondary | ICD-10-CM

## 2021-02-09 DIAGNOSIS — H353131 Nonexudative age-related macular degeneration, bilateral, early dry stage: Secondary | ICD-10-CM

## 2021-02-09 DIAGNOSIS — Z85528 Personal history of other malignant neoplasm of kidney: Secondary | ICD-10-CM

## 2021-02-09 DIAGNOSIS — I10 Essential (primary) hypertension: Secondary | ICD-10-CM

## 2021-02-09 DIAGNOSIS — H35352 Cystoid macular degeneration, left eye: Secondary | ICD-10-CM

## 2021-02-09 NOTE — Patient Instructions (Addendum)
Please take colchicine 0.6 mg tablet 1 tablet daily.  Do not stop it until advised.   Start allopurinol 4 days after starting colchicine. Start allopurinol 100 mg tablet 1 tablet daily for 2 weeks and then increase it to allopurinol 100 mg tablet, 2 tablets daily for 2 weeks.  The following month we will call in new prescription for allopurinol 300 mg tablet 1 tablet daily.  Please come in for blood work after 1 month of starting allopurinol and then after 2 months.  Gout  Gout is painful swelling of your joints. Gout is a type of arthritis. It is caused by having too much uric acid in your body. Uric acid is a chemical that is made when your body breaks down substances called purines. If your body has too much uric acid, sharp crystals can form and build up in your joints. This causes pain and swelling. Gout attacks can happen quickly and be very painful (acute gout). Over time, the attacks can affect more joints and happen more often (chronic gout). What are the causes?  Too much uric acid in your blood. This can happen because: ? Your kidneys do not remove enough uric acid from your blood. ? Your body makes too much uric acid. ? You eat too many foods that are high in purines. These foods include organ meats, some seafood, and beer.  Trauma or stress. What increases the risk?  Having a family history of gout.  Being female and middle-aged.  Being female and having gone through menopause.  Being very overweight (obese).  Drinking alcohol, especially beer.  Not having enough water in the body (being dehydrated).  Losing weight too quickly.  Having an organ transplant.  Having lead poisoning.  Taking certain medicines.  Having kidney disease.  Having a skin condition called psoriasis. What are the signs or symptoms? An attack of acute gout usually happens in just one joint. The most common place is the big toe. Attacks often start at night. Other joints that may be affected  include joints of the feet, ankle, knee, fingers, wrist, or elbow. Symptoms of an attack may include:  Very bad pain.  Warmth.  Swelling.  Stiffness.  Shiny, red, or purple skin.  Tenderness. The affected joint may be very painful to touch.  Chills and fever. Chronic gout may cause symptoms more often. More joints may be involved. You may also have white or yellow lumps (tophi) on your hands or feet or in other areas near your joints.   How is this treated?  Treatment for this condition has two phases: treating an acute attack and preventing future attacks.  Acute gout treatment may include: ? NSAIDs. ? Steroids. These are taken by mouth or injected into a joint. ? Colchicine. This medicine relieves pain and swelling. It can be given by mouth or through an IV tube.  Preventive treatment may include: ? Taking small doses of NSAIDs or colchicine daily. ? Using a medicine that reduces uric acid levels in your blood. ? Making changes to your diet. You may need to see a food expert (dietitian) about what to eat and drink to prevent gout. Follow these instructions at home: During a gout attack  If told, put ice on the painful area: ? Put ice in a plastic bag. ? Place a towel between your skin and the bag. ? Leave the ice on for 20 minutes, 2-3 times a day.  Raise (elevate) the painful joint above the level of your heart  as often as you can.  Rest the joint as much as possible. If the joint is in your leg, you may be given crutches.  Follow instructions from your doctor about what you cannot eat or drink.   Avoiding future gout attacks  Eat a low-purine diet. Avoid foods and drinks such as: ? Liver. ? Kidney. ? Anchovies. ? Asparagus. ? Herring. ? Mushrooms. ? Mussels. ? Beer.  Stay at a healthy weight. If you want to lose weight, talk with your doctor. Do not lose weight too fast.  Start or continue an exercise plan as told by your doctor. Eating and  drinking  Drink enough fluids to keep your pee (urine) pale yellow.  If you drink alcohol: ? Limit how much you use to:  0-1 drink a day for women.  0-2 drinks a day for men. ? Be aware of how much alcohol is in your drink. In the [place] one drink equals one 12 oz bottle of beer (355 mL), one 5 oz glass of wine (148 mL), or one 1 oz glass of hard liquor (44 mL). General instructions  Take over-the-counter and prescription medicines only as told by your doctor.  Do not drive or use heavy machinery while taking prescription pain medicine.  Return to your normal activities as told by your doctor. Ask your doctor what activities are safe for you.  Keep all follow-up visits as told by your doctor. This is important. Contact a doctor if:  You have another gout attack.  You still have symptoms of a gout attack after 10 days of treatment.  You have problems (side effects) because of your medicines.  You have chills or a fever.  You have burning pain when you pee (urinate).  You have pain in your lower back or belly. Get help right away if:  You have very bad pain.  Your pain cannot be controlled.  You cannot pee. Summary  Gout is painful swelling of the joints.  The most common site of pain is the big toe, but it can affect other joints.  Medicines and avoiding some foods can help to prevent and treat gout attacks. This information is not intended to replace advice given to you by your health care provider. Make sure you discuss any questions you have with your health care provider. Document Revised: 06/04/2018 Document Reviewed: 06/04/2018 Elsevier Patient Education  [place].

## 2021-02-10 LAB — CBC WITH DIFFERENTIAL/PLATELET
Absolute Monocytes: 626 cells/uL (ref 200–950)
Basophils Absolute: 74 cells/uL (ref 0–200)
Basophils Relative: 0.8 %
Eosinophils Absolute: 202 cells/uL (ref 15–500)
Eosinophils Relative: 2.2 %
HCT: 45.6 % — ABNORMAL HIGH (ref 35.0–45.0)
Hemoglobin: 14.9 g/dL (ref 11.7–15.5)
Lymphs Abs: 2180 cells/uL (ref 850–3900)
MCH: 29.1 pg (ref 27.0–33.0)
MCHC: 32.7 g/dL (ref 32.0–36.0)
MCV: 89.1 fL (ref 80.0–100.0)
MPV: 10.6 fL (ref 7.5–12.5)
Monocytes Relative: 6.8 %
Neutro Abs: 6118 cells/uL (ref 1500–7800)
Neutrophils Relative %: 66.5 %
Platelets: 313 10*3/uL (ref 140–400)
RBC: 5.12 10*6/uL — ABNORMAL HIGH (ref 3.80–5.10)
RDW: 13.2 % (ref 11.0–15.0)
Total Lymphocyte: 23.7 %
WBC: 9.2 10*3/uL (ref 3.8–10.8)

## 2021-02-10 LAB — COMPLETE METABOLIC PANEL WITH GFR
AG Ratio: 1.8 (calc) (ref 1.0–2.5)
ALT: 24 U/L (ref 6–29)
AST: 20 U/L (ref 10–35)
Albumin: 4.6 g/dL (ref 3.6–5.1)
Alkaline phosphatase (APISO): 134 U/L (ref 37–153)
BUN: 14 mg/dL (ref 7–25)
CO2: 30 mmol/L (ref 20–32)
Calcium: 10.4 mg/dL (ref 8.6–10.4)
Chloride: 104 mmol/L (ref 98–110)
Creat: 0.84 mg/dL (ref 0.60–0.93)
GFR, Est African American: 79 mL/min/{1.73_m2} (ref >=60)
GFR, Est Non African American: 68 mL/min/{1.73_m2} (ref >=60)
Globulin: 2.5 g/dL (calc) (ref 1.9–3.7)
Glucose, Bld: 96 mg/dL (ref 65–99)
Potassium: 4.7 mmol/L (ref 3.5–5.3)
Sodium: 142 mmol/L (ref 135–146)
Total Bilirubin: 0.8 mg/dL (ref 0.2–1.2)
Total Protein: 7.1 g/dL (ref 6.1–8.1)

## 2021-02-10 LAB — URIC ACID: Uric Acid, Serum: 6.6 mg/dL (ref 2.5–7.0)

## 2021-02-10 LAB — CYCLIC CITRUL PEPTIDE ANTIBODY, IGG: Cyclic Citrullin Peptide Ab: <16 UNITS

## 2021-02-10 LAB — RHEUMATOID FACTOR: Rheumatoid fact SerPl-aCnc: <14 IU/mL (ref <14)

## 2021-02-10 LAB — SEDIMENTATION RATE: Sed Rate: 9 mm/h (ref 0–30)

## 2021-02-10 NOTE — Progress Notes (Signed)
All the labs are within normal limits.  I will discuss results at the follow-up visit.

## 2021-02-21 ENCOUNTER — Other Ambulatory Visit: Payer: Self-pay

## 2021-02-21 ENCOUNTER — Ambulatory Visit
Admission: RE | Admit: 2021-02-21 | Discharge: 2021-02-21 | Disposition: A | Discharge status: Home / Self Care | Payer: Medicare Other | Source: Ambulatory Visit | Attending: Neurosurgery | Admitting: Neurosurgery

## 2021-02-21 DIAGNOSIS — M48062 Spinal stenosis, lumbar region with neurogenic claudication: Secondary | ICD-10-CM | POA: Diagnosis present

## 2021-02-21 MED ORDER — GADOBUTROL 1 MMOL/ML IV SOLN
10.0000 mL | Freq: Once | INTRAVENOUS | Status: AC | PRN
  Administered 2021-02-21: 10 mL via INTRAVENOUS

## 2021-02-28 ENCOUNTER — Telehealth: Payer: Self-pay | Admitting: Rheumatology

## 2021-02-28 NOTE — Telephone Encounter (Signed)
LMOM for patient to call to discuss medication use.

## 2021-02-28 NOTE — Telephone Encounter (Signed)
Patient calling to let you know she has stopped Colchicine due to upsetting stomach, and severe Diarrhea. She took it for 1 week before discontinuing it. Patient plans on keeping follow up appointment.

## 2021-02-28 NOTE — Telephone Encounter (Signed)
She should continue to take allopurinol as prescribed.  If she can take colchicine half a pill every other day that should be helpful to prevent flares.  But even if that causes diarrhea then she may discontinue.

## 2021-02-28 NOTE — Telephone Encounter (Signed)
F/u appt 03/21/2021

## 2021-02-28 NOTE — Telephone Encounter (Signed)
I called patient, patient states she cannot take colchicine without having diarrhea. She is currently not having diarrhea. She said should would attempt taking 1/2 pill every other day. Patient was crying.

## 2021-03-02 ENCOUNTER — Telehealth: Payer: Self-pay | Admitting: Cardiovascular Disease

## 2021-03-02 NOTE — Telephone Encounter (Signed)
   [facility] Pre-operative Risk Assessment    Patient Name: Carrie Larsen  DOB: [birth year]  MRN: [id number]   HEARTCARE STAFF: - Please ensure there is not already an duplicate clearance open for this procedure. - Under Visit Info/Reason for Call, type in Other and utilize the format Clearance MM/DD/YY or Clearance TBD. Do not use dashes or single digits. - If request is for dental extraction, please clarify the # of teeth to be extracted.  Request for surgical clearance:  1. What type of surgery is being performed? Lumbar facet bilateral L3-L4 - L4-L5   2. When is this surgery scheduled? 04/06/21  3. What type of clearance is required (medical clearance vs. Pharmacy clearance to hold med vs. Both)? both  4. Are there any medications that need to be held prior to surgery and how long? Discontinue Plavix for 7 day prior    5. Practice name and name of physician performing surgery? [facility] - Dr Lenord Carbo  6. What is the office phone number? [phone]   7.   What is the office fax number? [phone]  8.   Anesthesia type (None, local, MAC, general) ?not listed    Ace Gins 03/02/2021, 10:06 AM  _________________________________________________________________   (provider comments below)

## 2021-03-02 NOTE — Telephone Encounter (Signed)
Updated upcoming appointment note to include Cardiac Clearance and will call the requesting office to make aware.

## 2021-03-02 NOTE — Telephone Encounter (Signed)
Called and left a detailed voice message for the assistant for Dr. Davy Pique with the date and time for the patient's upcoming appointment. Also stated the if there were any questions to please give our office a call back ask for pre-op call back pool or the pre-op pool.

## 2021-03-02 NOTE — Telephone Encounter (Signed)
Patient has follow up with Ignacia Bayley NP on 4/21, please update the reason behind follow up to include cardiac clearance. Also let the requesting provider know that patient need follow up prior to final clearance.

## 2021-03-15 ENCOUNTER — Other Ambulatory Visit: Payer: Self-pay | Admitting: Cardiovascular Disease

## 2021-03-16 ENCOUNTER — Ambulatory Visit: Payer: Medicare Other | Admitting: Nurse Practitioner

## 2021-03-19 NOTE — Progress Notes (Signed)
Office Visit Note  Patient: Carrie Larsen             Date of Birth: 01-Nov-1946           MRN: [id number]             PCP: Rutherford Limerick, PA Referring: Gennette Pac, FNP Visit Date: 03/21/2021 Occupation: @GUAROCC @  Subjective:  Medication management   History of Present Illness: Carrie Larsen is a 75 y.o. female with history of gouty arthropathy.  She states she has been taking colchicine half a tablet every day.  She did not increase the dose of allopurinol and is still taking allopurinol 100 mg, 1 tablet daily.  She has had few twinges of discomfort in her left ankle for the last week.  She is staying away from red meat and shellfish.  Does not drink alcohol.  Activities of Daily Living:  Patient reports morning stiffness for 1 hour.   Patient Reports nocturnal pain.  Difficulty dressing/grooming: Denies Difficulty climbing stairs: Denies Difficulty getting out of chair: Denies Difficulty using hands for taps, buttons, cutlery, and/or writing: Denies  Review of Systems  Constitutional: Negative for fatigue, night sweats, weight gain and weight loss.  HENT: Negative for mouth sores, trouble swallowing, trouble swallowing, mouth dryness and nose dryness.   Eyes: Negative for pain, redness, visual disturbance and dryness.  Respiratory: Negative for cough, shortness of breath and difficulty breathing.   Cardiovascular: Positive for swelling in legs/feet. Negative for chest pain, palpitations, hypertension and irregular heartbeat.  Gastrointestinal: Negative for blood in stool, constipation and diarrhea.  Endocrine: Negative for excessive thirst and increased urination.  Genitourinary: Negative for difficulty urinating and vaginal dryness.  Musculoskeletal: Positive for arthralgias, joint pain and morning stiffness. Negative for joint swelling, myalgias, muscle weakness, muscle tenderness and myalgias.  Skin: Negative for color change, rash, hair loss, skin tightness, ulcers  and sensitivity to sunlight.  Allergic/Immunologic: Negative for susceptible to infections.  Neurological: Negative for dizziness, numbness, memory loss, night sweats and weakness.  Hematological: Negative for bruising/bleeding tendency and swollen glands.  Psychiatric/Behavioral: Negative for depressed mood and sleep disturbance. The patient is not nervous/anxious.     PMFS History:  Patient Active Problem List   Diagnosis Date Noted  . Personal history of kidney stones 12/25/2019  . 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 CME (cystoid macular edema), left 11/20/2018  . Nonexudative age-related macular degeneration, bilateral, early dry stage 11/20/2018  . Pseudophakia of both eyes 11/20/2018  . Olecranon bursitis 10/16/2018  . Contusion of knee 10/16/2018  . Arthritis of knee 10/16/2018  . Arthritis of hand 10/16/2018  . 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 Morbid obesity ([place]) 09/11/2017  . Non-STEMI (non-ST elevated myocardial infarction) ([place]) 06/27/2017  . Abdominal pain 02/28/2017  . History of renal cell carcinoma 02/20/2017  . Knee pain 08/14/2016  . Strain of hamstring muscle 08/08/2016  . Left leg swelling 08/22/2015  . Systolic murmur   . Retrosternal chest pain 07/08/2015  . History of colonic polyps 04/16/2015  . Chest pain 03/08/2011  . Hyperlipidemia 03/08/2011  . HTN (hypertension) 03/08/2011    Past Medical History:  Diagnosis Date  . Anxiety   . Arthritis    osteoarthritis  . CAD (coronary artery disease)    a. 2012 ETT: no ischemia;  b. 06/2017 NSTEMI/PCI: LM nl, LAD nl, D1 70-80p, LCX nl, OM1 90 (3.0 x 23 Xience [place]), RCA dominant, 100p/m, fills via L->R collats, EF 55-65%.  Marland Kitchen  Cancer [facility])    a. s/p partial left nephrectomy.  . Chicken pox   . CME (cystoid macular edema), left 11/20/2018  . Colon cancer ([place]) 1992   T3, N1, M0. colon   . Colon polyp 2011  . COVID-19 07/2019  . Diastolic dysfunction    a. 08/2015 Echo: EF  60-65%, no rwma, Gr1 DD, midlly dil LA, PASP 21mmHg. No significant valvular dzs; b. 06/2017 Echo: EF 60-65%, Gr1DD, mildly dil LA.  . Essential hypertension   . GERD (gastroesophageal reflux disease)   . Gout   . History of appendectomy 06/07/2015  . History of kidney cancer 11/20/2018  . History of kidney stones    passed - 2  . Hyperlipidemia   . Lumbar spinal stenosis   . Myocardial infarction ([place])    06/2017  . Nonexudative age-related macular degeneration, bilateral, early dry stage 11/20/2018  . Obesity   . Prediabetes   . Pseudophakia of both eyes   . Retinal cyst     Family History  Problem Relation Age of Onset  . Colon cancer Mother   . Cerebral aneurysm Father   . Heart attack Father   . Heart attack Brother 16  . Healthy Son    Past Surgical History:  Procedure Laterality Date  . ABDOMINAL HYSTERECTOMY    . APPENDECTOMY  06/07/2015  . CARDIAC CATHETERIZATION    . CATARACT EXTRACTION W/PHACO Left 04/24/2016   Procedure: CATARACT EXTRACTION PHACO AND INTRAOCULAR LENS PLACEMENT (IOC);  Surgeon: Birder Robson, MD;  Location: [facility] ORS;  Service: Ophthalmology;  Laterality: Left;  [place] 45.8 AP% 16.4 CDE 7.53 FLUID PACK LOT # [id number]  . CATARACT EXTRACTION W/PHACO Right 06/05/2016   Procedure: CATARACT EXTRACTION PHACO AND INTRAOCULAR LENS PLACEMENT (IOC);  Surgeon: Birder Robson, MD;  Location: [facility] ORS;  Service: Ophthalmology;  Laterality: Right;  [place] 25.9 AP% 21.9 CDE 5.68 Fluid pack lot # [id number]  . COLON RESECTION  03/04/1991  . COLON SURGERY    . COLONOSCOPY  03-14-10   Dr Bary Castilla, tubular adenoma at 25 cm.  . COLONOSCOPY WITH PROPOFOL N/A 06/07/2015   Procedure: COLONOSCOPY WITH PROPOFOL;  Surgeon: Robert Bellow, MD;  Location: [facility] ENDOSCOPY;  Service: Endoscopy;  Laterality: N/A;  . COLONOSCOPY WITH PROPOFOL N/A 08/10/2020   Procedure: COLONOSCOPY WITH PROPOFOL;  Surgeon: Robert Bellow, MD;  Location: [facility] ENDOSCOPY;  Service: Endoscopy;   Laterality: N/A;  . CORONARY STENT INTERVENTION N/A 06/28/2017   Procedure: CORONARY STENT INTERVENTION;  Surgeon: Yolonda Kida, MD;  Location: [place] CV LAB;  Service: Cardiovascular;  Laterality: N/A;  . EYE SURGERY     lens eye surgery  . HERNIA REPAIR  [id number]   umbilical  . LEFT HEART CATH AND CORONARY ANGIOGRAPHY N/A 06/28/2017   Procedure: LEFT HEART CATH AND CORONARY ANGIOGRAPHY;  Surgeon: Minna Merritts, MD;  Location: [place] CV LAB;  Service: Cardiovascular;  Laterality: N/A;  . LUMBAR LAMINECTOMY/DECOMPRESSION MICRODISCECTOMY Bilateral 03/10/2018   Procedure: Laminectomy and Foraminotomy - Lumbar three-Lumbar four - Lumbar four-Lumbar five - bilateral;  Surgeon: Earnie Larsson, MD;  Location: [place];  Service: Neurosurgery;  Laterality: Bilateral;  . NEPHRECTOMY  1992   left- cancer  . RIB RESECTION     removal 4 ribs  . TONSILLECTOMY     Social History   Social History Narrative  . Not on file   Immunization History  Administered Date(s) Administered  . Influenza, High Dose Seasonal PF 09/20/2018  . Moderna Sars-Covid-2 Vaccination 06/21/2020, 09/06/2020  .  Pneumococcal Polysaccharide-23 06/29/2017     Objective: Vital Signs: BP 135/73 (BP Location: Left Arm, Patient Position: Sitting, Cuff Size: Normal)   Pulse 66   Resp 18   Ht 5' 10"  (1.778 m)   Wt 272 lb (123.4 kg)   BMI 39.03 kg/m    Physical Exam Vitals and nursing note reviewed.  Constitutional:      Appearance: She is well-developed.  HENT:     Head: Normocephalic and atraumatic.  Eyes:     Conjunctiva/sclera: Conjunctivae normal.  Cardiovascular:     Rate and Rhythm: Normal rate and regular rhythm.     Heart sounds: Normal heart sounds.  Pulmonary:     Effort: Pulmonary effort is normal.     Breath sounds: Normal breath sounds.  Abdominal:     General: Bowel sounds are normal.     Palpations: Abdomen is soft.  Musculoskeletal:     Cervical back: Normal range of motion.   Lymphadenopathy:     Cervical: No cervical adenopathy.  Skin:    General: Skin is warm and dry.     Capillary Refill: Capillary refill takes less than 2 seconds.  Neurological:     Mental Status: She is alert and oriented to person, place, and time.  Psychiatric:        Behavior: Behavior normal.      Musculoskeletal Exam: C-spine was in good range of motion.  Shoulder joints, elbow joints, wrist joints with good range of motion with no synovitis.  She had bilateral PIP and DIP thickening.  Hip joints and knee joints with good range of motion.  She had no tenderness over ankles or MTPs.  CDAI Exam: CDAI Score: -- Patient Global: --; Provider Global: -- Swollen: --; Tender: -- Joint Exam 03/21/2021   No joint exam has been documented for this visit   There is currently no information documented on the homunculus. Go to the Rheumatology activity and complete the homunculus joint exam.  Investigation: No additional findings.  Imaging: MR Lumbar Spine W Wo Contrast  Result Date: 02/22/2021 CLINICAL DATA:  Lumbar stenosis with neurogenic claudication. Additional history provided by scanning technologist: Patient reports low back pain for 2 months, pain across back and radiating up flank, difficulty walking. EXAM: MRI LUMBAR SPINE WITHOUT AND WITH CONTRAST TECHNIQUE: Multiplanar and multiecho pulse sequences of the lumbar spine were obtained without and with intravenous contrast. CONTRAST:  76m GADAVIST GADOBUTROL 1 MMOL/ML IV SOLN COMPARISON:  Radiographs of the lumbar spine 03/10/2018. Lumbar spine MRI 06/11/2017. FINDINGS: Segmentation: 5 lumbar vertebrae. The caudal most well-formed intervertebral disc space is designated L5-S1. Alignment: Lumbar dextrocurvature. Straightening of the expected lumbar lordosis. 2 mm L4-L5 grade 1 anterolisthesis. 2 mm L5-S1 grade 1 retrolisthesis. Vertebrae: Vertebral body height is maintained. Mild degenerative endplate edema and enhancement at L3-L4.  No significant marrow edema or focal suspicious osseous lesion identified elsewhere. Conus medullaris and cauda equina: Conus extends to the L2 inferior endplate level. No signal abnormality within the visualized distal spinal cord. Paraspinal and other soft tissues: Small right renal cyst. Surgically absent left kidney. Atrophy of the lumbar paraspinal musculature. Postsurgical changes to the dorsal lumbar soft tissues at L3-L4 and L4-L5 Disc levels: Unless otherwise stated, the level by level findings below have not significantly changed since prior MRI 06/11/2017. Congenitally narrow lumbar spinal canal. Progressive multilevel disc degeneration. Most notably, there is progressive advanced disc degeneration at L3-L4 and progressive moderate disc degeneration on the left at L4-L5. Unchanged moderate disc degeneration on the  left at L5-S1. T12-L1: Minimal disc bulge. No significant degenerative spinal canal or foraminal stenosis. L1-L2: Minimal disc bulge. Mild facet arthrosis. No significant degenerative spinal canal or foraminal stenosis. L2-L3: Disc bulge. Mild facet arthrosis/ligamentum flavum hypertrophy. No significant spinal canal or foraminal stenosis. L3-L4: Interval right laminectomy and discectomy. Disc bulge with progressive endplate spurring. There is a new superimposed small central/left subarticular disc extrusion with slight caudal migration (for instance as seen on series 5, image 9) (series 8, images 24 and 25). Additionally, there is a new left foraminal/extraforaminal disc protrusion. Facet degeneration and hypertrophy (greater on the left). The central/left subarticular disc extrusion contributes to moderate left subarticular/lateral recess narrowing with some crowding of the descending left L4 nerve root. Mild relative narrowing of the central canal, significantly improved. Moderate bilateral neural foraminal narrowing, progressed on the left. L4-L5: Post laminectomy changes, new from the prior  exam. Grade 1 anterolisthesis. Disc uncovering with disc bulge and endplate spurring. Facet degeneration and hypertrophy (greater on the left). No significant central canal stenosis remains. Mild residual left subarticular narrowing without appreciable nerve root impingement. Redemonstrated severe left neural foraminal narrowing L5-S1: Grade 1 retrolisthesis. Disc bulge and endplate spurring. Mild facet arthrosis/ligamentum flavum hypertrophy. As before, there is minimal right subarticular narrowing without appreciable nerve root impingement. Central canal patent. Bilateral neural foraminal narrowing (moderate/severe right, moderate left), unchanged. IMPRESSION: Comparison is made to the prior lumbar spine MRI of 06/11/2017 Lumbar spondylosis and postoperative changes, as outlined and with findings most notably as follows. At L3-L4, there is progressive advanced disc degeneration with mild degenerative endplate edema and enhancement. There has been interval right laminectomy and discectomy. Disc bulge with progressive endplate spurring. New superimposed small central/left subarticular disc extrusion with slight caudal migration. New left foraminal/extraforaminal disc protrusion. Facet degeneration and hypertrophy (greater on the left). The central/left subarticular disc extrusion contributes to moderate left subarticular/lateral recess narrowing with some crowding of the descending left L4 nerve root. Mild relative narrowing of the central canal, significantly improved. Moderate bilateral neural foraminal narrowing, progressed on the left. At L4-L5, there is persistent grade 1 anterolisthesis. Progressive moderate disc degeneration. Post laminectomy changes are new from the prior exam. No significant central canal stenosis remains. Mild residual multifactorial left subarticular narrowing without appreciable nerve root impingement. Redemonstrated severe left neural foraminal narrowing. At L5-S1, there is persistent  grade 1 retrolisthesis. Unchanged minimal multifactorial right subarticular narrowing without nerve root impingement. Unchanged bilateral neural foraminal narrowing (moderate/severe right, moderate left). Electronically Signed   By: Kellie Simmering DO   On: 02/22/2021 08:23    Recent Labs: Lab Results  Component Value Date   WBC 9.2 02/09/2021   HGB 14.9 02/09/2021   PLT 313 02/09/2021   NA 142 02/09/2021   K 4.7 02/09/2021   CL 104 02/09/2021   CO2 30 02/09/2021   GLUCOSE 96 02/09/2021   BUN 14 02/09/2021   CREATININE 0.84 02/09/2021   BILITOT 0.8 02/09/2021   ALKPHOS 148 (H) 06/30/2020   AST 20 02/09/2021   ALT 24 02/09/2021   PROT 7.1 02/09/2021   ALBUMIN 4.5 06/30/2020   CALCIUM 10.4 02/09/2021   GFRAA 79 02/09/2021    February 09, 2021 rheumatoid factor negative, anti-CCP negative, uric acid 6.6, ESR 9  Speciality Comments: No specialty comments available.  Procedures:  No procedures performed Allergies: Morphine and related, Prednisone, and Latex   Assessment / Plan:     Visit Diagnoses: Idiopathic chronic gout of multiple sites without tophus - She was placed on  allopurinol 100 mg p.o. daily and the dose to be increased to 200 mg p.o. daily.  Plan was to increase the allopurinol to 300 mg p.o.qd.  Patient states that she never did increase the dose of allopurinol.  She has been taking allopurinol 100 mg p.o. daily.  She also has been taking colchicine.  She had a mild flare of gout which responded to colchicine.  I again advised her to increase allopurinol to, 2 tablets daily for 1 month.  After 1 month we will increase allopurinol to 300 mg daily.  When she runs out of allopurinol tablets she will call us to get a new prescription which will be 300 mg allopurinol, 1 tablet daily.  Medication monitoring encounter - Allopurinol 100 mg p.o. daily.  Colchicine 0.6 mg p.o. daily  Primary osteoarthritis of both hands-she has DIP and PIP thickening.  Joint protection was  discussed.  Primary osteoarthritis of both feet - And had cortisone injection to her MTPs in the past by Dr. Milinda Pointer for gout flare.  Lumbar stenosis with neurogenic claudication - Status post discectomy 2019.  Followed by Dr. Trenton Gammon.  She has chronic pain.  She recently had MRI of the lumbar spine.  She was advised ablation procedure which she declined.  She states she will try weight loss and exercise.  Primary hypertension-her blood pressure is well controlled today.  Increased risk of heart disease with gout was also discussed.  Dietary modifications and exercise was emphasized.  Non-STEMI (non-ST elevated myocardial infarction) (HCC)  Systolic murmur  History of hyperlipidemia  History of renal cell carcinoma  Personal history of kidney stones  History of colonic polyps  Choroidal nevus, right eye  Pseudophakia of both eyes  Nonexudative age-related macular degeneration, bilateral, early dry stage  Orders: No orders of the defined types were placed in this encounter.  No orders of the defined types were placed in this encounter.    Follow-Up Instructions: No follow-ups on file.   Bo Merino, MD  Note - This record has been created using Editor, commissioning.  Chart creation errors have been sought, but may not always  have been located. Such creation errors do not reflect on  the standard of medical care.

## 2021-03-21 ENCOUNTER — Ambulatory Visit (INDEPENDENT_AMBULATORY_CARE_PROVIDER_SITE_OTHER): Payer: Medicare Other | Admitting: Rheumatology

## 2021-03-21 ENCOUNTER — Encounter: Payer: Self-pay | Admitting: Rheumatology

## 2021-03-21 ENCOUNTER — Other Ambulatory Visit: Payer: Self-pay

## 2021-03-21 VITALS — BP 135/73 | HR 66 | Resp 18 | Ht 70.0 in | Wt 272.0 lb

## 2021-03-21 DIAGNOSIS — Z5181 Encounter for therapeutic drug level monitoring: Secondary | ICD-10-CM

## 2021-03-21 DIAGNOSIS — M1A09X0 Idiopathic chronic gout, multiple sites, without tophus (tophi): Secondary | ICD-10-CM | POA: Diagnosis not present

## 2021-03-21 DIAGNOSIS — Z860100 Personal history of colon polyps, unspecified: Secondary | ICD-10-CM

## 2021-03-21 DIAGNOSIS — I10 Essential (primary) hypertension: Secondary | ICD-10-CM

## 2021-03-21 DIAGNOSIS — D3131 Benign neoplasm of right choroid: Secondary | ICD-10-CM

## 2021-03-21 DIAGNOSIS — I214 Non-ST elevation (NSTEMI) myocardial infarction: Secondary | ICD-10-CM | POA: Diagnosis not present

## 2021-03-21 DIAGNOSIS — M19041 Primary osteoarthritis, right hand: Secondary | ICD-10-CM

## 2021-03-21 DIAGNOSIS — M19072 Primary osteoarthritis, left ankle and foot: Secondary | ICD-10-CM

## 2021-03-21 DIAGNOSIS — M19071 Primary osteoarthritis, right ankle and foot: Secondary | ICD-10-CM

## 2021-03-21 DIAGNOSIS — Z85528 Personal history of other malignant neoplasm of kidney: Secondary | ICD-10-CM

## 2021-03-21 DIAGNOSIS — R011 Cardiac murmur, unspecified: Secondary | ICD-10-CM

## 2021-03-21 DIAGNOSIS — Z8639 Personal history of other endocrine, nutritional and metabolic disease: Secondary | ICD-10-CM

## 2021-03-21 DIAGNOSIS — H353131 Nonexudative age-related macular degeneration, bilateral, early dry stage: Secondary | ICD-10-CM

## 2021-03-21 DIAGNOSIS — Z961 Presence of intraocular lens: Secondary | ICD-10-CM

## 2021-03-21 DIAGNOSIS — M19042 Primary osteoarthritis, left hand: Secondary | ICD-10-CM

## 2021-03-21 DIAGNOSIS — M48062 Spinal stenosis, lumbar region with neurogenic claudication: Secondary | ICD-10-CM

## 2021-03-21 DIAGNOSIS — Z87442 Personal history of urinary calculi: Secondary | ICD-10-CM

## 2021-03-21 NOTE — Patient Instructions (Addendum)
Please take allopurinol 100 mg, 2 tablets every day  for 1 month and then increase Allopurinol to 3 tablets every day.  Once you run out of Allopurinol, please call us so that we can start you on allopurinol 300 mg 1 tablet daily.

## 2021-03-27 DIAGNOSIS — G894 Chronic pain syndrome: Secondary | ICD-10-CM | POA: Insufficient documentation

## 2021-04-11 NOTE — Progress Notes (Signed)
Office Visit Note  Patient: Carrie Larsen             Date of Birth: 31-Jan-1946           MRN: [id number]             PCP: Rutherford Limerick, PA Referring: Rutherford Limerick, PA Visit Date: 04/25/2021 Occupation: @GUAROCC @  Subjective:  Great toe pain in both feet   History of Present Illness: Carrie Larsen is a 75 y.o. female with history of gout and ostearthritis.  She is taking allopurinol 100 mg daily and colchicine 0.6 mg daily as needed.  She has been tolerating allopurinol as prescribed.  She did not increase the dose of allopurinol as instructed after her last office visit in April 2022 due to feeling as though the current dose of allopurinol was working.  She has been trying to avoid red meat, shellfish, and alcohol use. On 04/20/21 she started to experience symptoms of a gout flare starting first in her left great toe then effecting her right great toe.  She started to take colchicine 0.6 half tablet daily but developed diarrhea on Sunday and has not had a dose of colchicine since Saturday.  She cannot take oral prednisone, but she plans on seeing Dr. Milinda Pointer for an injection to alleviate the discomfort she is experiencing.      Activities of Daily Living:  Patient reports morning stiffness for 0 minutes.   Patient Denies nocturnal pain.  Difficulty dressing/grooming: Denies Difficulty climbing stairs: Denies Difficulty getting out of chair: Denies Difficulty using hands for taps, buttons, cutlery, and/or writing: Denies  Review of Systems  Constitutional: Negative for fatigue.  HENT: Negative for mouth sores, mouth dryness and nose dryness.   Eyes: Negative for pain, itching and dryness.  Respiratory: Negative for shortness of breath and difficulty breathing.   Cardiovascular: Negative for chest pain and palpitations.  Gastrointestinal: Positive for diarrhea. Negative for blood in stool and constipation.  Endocrine: Negative for increased urination.  Genitourinary:  Negative for difficulty urinating.  Musculoskeletal: Positive for arthralgias, joint pain and joint swelling. Negative for myalgias, morning stiffness, muscle tenderness and myalgias.  Skin: Negative for color change, rash and redness.  Allergic/Immunologic: Negative for susceptible to infections.  Neurological: Negative for dizziness, numbness, headaches, memory loss and weakness.  Hematological: Negative for bruising/bleeding tendency.  Psychiatric/Behavioral: Negative for confusion.    PMFS History:  Patient Active Problem List   Diagnosis Date Noted  . Personal history of kidney stones 12/25/2019  . 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 CME (cystoid macular edema), left 11/20/2018  . Nonexudative age-related macular degeneration, bilateral, early dry stage 11/20/2018  . Pseudophakia of both eyes 11/20/2018  . Olecranon bursitis 10/16/2018  . Contusion of knee 10/16/2018  . Arthritis of knee 10/16/2018  . Arthritis of hand 10/16/2018  . 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 Morbid obesity ([place]) 09/11/2017  . Non-STEMI (non-ST elevated myocardial infarction) ([place]) 06/27/2017  . Abdominal pain 02/28/2017  . History of renal cell carcinoma 02/20/2017  . Knee pain 08/14/2016  . Strain of hamstring muscle 08/08/2016  . Left leg swelling 08/22/2015  . Systolic murmur   . Retrosternal chest pain 07/08/2015  . History of colonic polyps 04/16/2015  . Chest pain 03/08/2011  . Hyperlipidemia 03/08/2011  . HTN (hypertension) 03/08/2011    Past Medical History:  Diagnosis Date  . Anxiety   . Arthritis    osteoarthritis  . CAD (coronary artery disease)  a. 2012 ETT: no ischemia;  b. 06/2017 NSTEMI/PCI: LM nl, LAD nl, D1 70-80p, LCX nl, OM1 90 (3.0 x 23 Xience [place]), RCA dominant, 100p/m, fills via L->R collats, EF 55-65%.  . Cancer [facility])    a. s/p partial left nephrectomy.  . Chicken pox   . CME (cystoid macular edema), left 11/20/2018  . Colon cancer  ([place]) 1992   T3, N1, M0. colon   . Colon polyp 2011  . COVID-19 07/2019  . Diastolic dysfunction    a. 08/2015 Echo: EF 60-65%, no rwma, Gr1 DD, midlly dil LA, PASP 36mmHg. No significant valvular dzs; b. 06/2017 Echo: EF 60-65%, Gr1DD, mildly dil LA.  . Essential hypertension   . GERD (gastroesophageal reflux disease)   . Gout   . History of appendectomy 06/07/2015  . History of kidney cancer 11/20/2018  . History of kidney stones    passed - 2  . Hyperlipidemia   . Lumbar spinal stenosis   . Myocardial infarction ([place])    06/2017  . Nonexudative age-related macular degeneration, bilateral, early dry stage 11/20/2018  . Obesity   . Prediabetes   . Pseudophakia of both eyes   . Retinal cyst     Family History  Problem Relation Age of Onset  . Colon cancer Mother   . Cerebral aneurysm Father   . Heart attack Father   . Heart attack Brother 55  . Healthy Son    Past Surgical History:  Procedure Laterality Date  . ABDOMINAL HYSTERECTOMY    . APPENDECTOMY  06/07/2015  . CARDIAC CATHETERIZATION    . CATARACT EXTRACTION W/PHACO Left 04/24/2016   Procedure: CATARACT EXTRACTION PHACO AND INTRAOCULAR LENS PLACEMENT (IOC);  Surgeon: Birder Robson, MD;  Location: [facility] ORS;  Service: Ophthalmology;  Laterality: Left;  [place] 45.8 AP% 16.4 CDE 7.53 FLUID PACK LOT # [id number]  . CATARACT EXTRACTION W/PHACO Right 06/05/2016   Procedure: CATARACT EXTRACTION PHACO AND INTRAOCULAR LENS PLACEMENT (IOC);  Surgeon: Birder Robson, MD;  Location: [facility] ORS;  Service: Ophthalmology;  Laterality: Right;  [place] 25.9 AP% 21.9 CDE 5.68 Fluid pack lot # [id number]  . COLON RESECTION  03/04/1991  . COLON SURGERY    . COLONOSCOPY  03-14-10   Dr Bary Castilla, tubular adenoma at 25 cm.  . COLONOSCOPY WITH PROPOFOL N/A 06/07/2015   Procedure: COLONOSCOPY WITH PROPOFOL;  Surgeon: Robert Bellow, MD;  Location: [facility] ENDOSCOPY;  Service: Endoscopy;  Laterality: N/A;  . COLONOSCOPY WITH PROPOFOL N/A 08/10/2020    Procedure: COLONOSCOPY WITH PROPOFOL;  Surgeon: Robert Bellow, MD;  Location: [facility] ENDOSCOPY;  Service: Endoscopy;  Laterality: N/A;  . CORONARY STENT INTERVENTION N/A 06/28/2017   Procedure: CORONARY STENT INTERVENTION;  Surgeon: Yolonda Kida, MD;  Location: [place] CV LAB;  Service: Cardiovascular;  Laterality: N/A;  . EYE SURGERY     lens eye surgery  . HERNIA REPAIR  [id number]   umbilical  . LEFT HEART CATH AND CORONARY ANGIOGRAPHY N/A 06/28/2017   Procedure: LEFT HEART CATH AND CORONARY ANGIOGRAPHY;  Surgeon: Minna Merritts, MD;  Location: [place] CV LAB;  Service: Cardiovascular;  Laterality: N/A;  . LUMBAR LAMINECTOMY/DECOMPRESSION MICRODISCECTOMY Bilateral 03/10/2018   Procedure: Laminectomy and Foraminotomy - Lumbar three-Lumbar four - Lumbar four-Lumbar five - bilateral;  Surgeon: Earnie Larsson, MD;  Location: [place];  Service: Neurosurgery;  Laterality: Bilateral;  . NEPHRECTOMY  1992   left- cancer  . RIB RESECTION     removal 4 ribs  . TONSILLECTOMY  Social History   Social History Narrative  . Not on file   Immunization History  Administered Date(s) Administered  . Influenza, High Dose Seasonal PF 09/20/2018  . Moderna Sars-Covid-2 Vaccination 06/21/2020, 09/06/2020, 04/06/2021  . Pneumococcal Polysaccharide-23 06/29/2017     Objective: Vital Signs: BP (!) 166/81 (BP Location: Left Arm, Patient Position: Sitting, Cuff Size: Normal)   Pulse 64   Resp 16   Ht 5\' 10"  (1.778 m)   Wt 267 lb (121.1 kg)   BMI 38.31 kg/m    Physical Exam Vitals and nursing note reviewed.  Constitutional:      Appearance: She is well-developed.  HENT:     Head: Normocephalic and atraumatic.  Eyes:     Conjunctiva/sclera: Conjunctivae normal.  Pulmonary:     Effort: Pulmonary effort is normal.  Abdominal:     Palpations: Abdomen is soft.  Musculoskeletal:     Cervical back: Normal range of motion.  Skin:    General: Skin is warm and dry.     Capillary Refill:  Capillary refill takes less than 2 seconds.  Neurological:     Mental Status: She is alert and oriented to person, place, and time.  Psychiatric:        Behavior: Behavior normal.      Musculoskeletal Exam: C-spine, thoracic spine, and lumbar spine good ROM.  Shoulder joints, elbow joints, wrist joints, MCPs, PIPs, and DIPs good ROM with no synovitis.  Synovial thickening of MCP joints but no tenderness or joint swelling.  Complete fist formation bilaterally.  Knee joints good ROM with no discomfort.  No warmth or effusion of knee joints.  Ankle joints have good ROM with no tenderness or joint swelling.  Tenderness, swelling, and erythema over bilateral 1st MTP joints.  Tenderness over the left 2nd and 3rd MTP joints.    CDAI Exam: CDAI Score: -- Patient Global: --; Provider Global: -- Swollen: --; Tender: -- Joint Exam 04/25/2021   No joint exam has been documented for this visit   There is currently no information documented on the homunculus. Go to the Rheumatology activity and complete the homunculus joint exam.  Investigation: No additional findings.  Imaging: No results found.  Recent Labs: Lab Results  Component Value Date   WBC 9.2 02/09/2021   HGB 14.9 02/09/2021   PLT 313 02/09/2021   NA 142 02/09/2021   K 4.7 02/09/2021   CL 104 02/09/2021   CO2 30 02/09/2021   GLUCOSE 96 02/09/2021   BUN 14 02/09/2021   CREATININE 0.84 02/09/2021   BILITOT 0.8 02/09/2021   ALKPHOS 148 (H) 06/30/2020   AST 20 02/09/2021   ALT 24 02/09/2021   PROT 7.1 02/09/2021   ALBUMIN 4.5 06/30/2020   CALCIUM 10.4 02/09/2021   GFRAA 79 02/09/2021    Speciality Comments: No specialty comments available.  Procedures:  No procedures performed Allergies: Morphine and related, Prednisone, and Latex   Assessment / Plan:     Visit Diagnoses: Idiopathic chronic gout of multiple sites without tophus - She started develop signs and symptoms of a gout flare in the left great toe on  04/20/2021.  She has been taking allopurinol 100 mg 1 tablet daily and has not missed any doses.  She cannot identify a trigger for the flare. She tried taking colchicine either a half tablet daily or 1 tablet every other day to alleviate her symptoms but on Sunday she had 2 episodes of diarrhea.  Her last dose of colchicine was on Saturday.  She continues to have pain and swelling both great toes, left > right.  Her uric acid was 6.6 on 02/09/21.  Discussed that ideally her uric acid level should be less than 6 to help prevent flares.  We discussed the importance of avoiding high fructose corn syrup and purine rich foods.  She has been trying to avoid red meat, shellfish, and alcohol use.  We will check uric acid level today. She has been apprehensive to increase the dose of allopurinol as instructed by Dr. Estanislado Pandy at her last OV on 03/21/21 due to the concern for side effects.  She was advised to try increasing the dose of allopurinol to 200 mg daily.  She will notify us if she cannot tolerate allopurinol.  She was advised to take colchicine 0.6 mg half tablet daily for the next week (if tolerated) while initiating the higher dose of allopurinol.  For her current flare, she will be seeing Dr. Milinda Pointer tomorrow for a left 1st MTP joint cortisone injection.  She cannot tolerate oral prednisone. Patient is in agreement with the plan. She will follow-up in the office in our office in 3 months.  Plan: Uric acid  Medication monitoring encounter - Allopurinol 200 mg p.o. daily and Colchicine 0.6 mg p.o. daily as needed.  CBC and CMP will be drawn today to monitor for drug toxicity. - Plan: CBC with Differential/Platelet, COMPLETE METABOLIC PANEL WITH GFR, Uric acid  Primary osteoarthritis of both hands: She has PIP and DIP thickening consistent with osteoarthritis of both hands.  No joint tenderness or inflammation noted.  Complete fist formation bilaterally.   Primary osteoarthritis of both feet -She is a current  patient of Dr. Stephenie Acres.  She has undergone MTP cortisone injections in the past.  An appointment with Dr. Milinda Pointer was scheduled tomorrow for a left 1st MTP joint cortisone injection.  Discussed the importance of wearing proper fitting shoes.  Lumbar stenosis with neurogenic claudication - Status post discectomy 2019.  Followed by Dr. Trenton Gammon.   Other medical conditions are listed as follows:   Primary hypertension  Non-STEMI (non-ST elevated myocardial infarction) ([place])  History of hyperlipidemia  Systolic murmur  Personal history of kidney stones  History of renal cell carcinoma  Choroidal nevus, right eye  History of colonic polyps  Pseudophakia of both eyes  Nonexudative age-related macular degeneration, bilateral, early dry stage  Orders: Orders Placed This Encounter  Procedures  . CBC with Differential/Platelet  . COMPLETE METABOLIC PANEL WITH GFR  . Uric acid   Meds ordered this encounter  Medications  . allopurinol (ZYLOPRIM) 100 MG tablet    Sig: Take 2 tablets (200 mg total) by mouth daily.    Dispense:  60 tablet    Refill:  2     Follow-Up Instructions: Return in about 3 months (around 07/26/2021) for Gout, Osteoarthritis.   Ofilia Neas, PA-C  Note - This record has been created using Dragon software.  Chart creation errors have been sought, but may not always  have been located. Such creation errors do not reflect on  the standard of medical care.

## 2021-04-12 ENCOUNTER — Encounter: Payer: Self-pay | Admitting: Nurse Practitioner

## 2021-04-12 ENCOUNTER — Ambulatory Visit (INDEPENDENT_AMBULATORY_CARE_PROVIDER_SITE_OTHER): Payer: Medicare Other | Admitting: Nurse Practitioner

## 2021-04-12 ENCOUNTER — Other Ambulatory Visit: Payer: Self-pay

## 2021-04-12 VITALS — BP 158/90 | HR 64 | Ht 70.0 in | Wt 270.0 lb

## 2021-04-12 DIAGNOSIS — I2510 Atherosclerotic heart disease of native coronary artery without angina pectoris: Secondary | ICD-10-CM | POA: Diagnosis not present

## 2021-04-12 DIAGNOSIS — I10 Essential (primary) hypertension: Secondary | ICD-10-CM

## 2021-04-12 DIAGNOSIS — E785 Hyperlipidemia, unspecified: Secondary | ICD-10-CM | POA: Diagnosis not present

## 2021-04-12 MED ORDER — AMLODIPINE BESYLATE 2.5 MG PO TABS: 2.5000 mg | ORAL_TABLET | Freq: Every day | ORAL | 5 refills | Status: DC

## 2021-04-12 NOTE — Progress Notes (Signed)
Office Visit    Patient Name: Carrie Larsen Date of Encounter: 04/12/2021  Primary Care Provider:  Rutherford Limerick, PA Primary Cardiologist:  Ida Rogue, MD  Chief Complaint    75 y/o ? w/ a h/o CAD s/p NSTEMI and OM1 stenting in 06/2017, HTN, HL, renal cell carcinoma status post partial left nephrectomy, colon cancer, obesity, GERD, and degenerative disc disease with spinal stenosis pending surgery, who presents for f/u of CAD and HTN.  Past Medical History    Past Medical History:  Diagnosis Date  . Anxiety   . Arthritis    osteoarthritis  . CAD (coronary artery disease)    a. 2012 ETT: no ischemia;  b. 06/2017 NSTEMI/PCI: LM nl, LAD nl, D1 70-80p, LCX nl, OM1 90 (3.0 x 23 Xience [place]), RCA dominant, 100p/m, fills via L->R collats, EF 55-65%.  . Cancer [facility])    a. s/p partial left nephrectomy.  . Chicken pox   . CME (cystoid macular edema), left 11/20/2018  . Colon cancer ([place]) 1992   T3, N1, M0. colon   . Colon polyp 2011  . COVID-19 07/2019  . Diastolic dysfunction    a. 08/2015 Echo: EF 60-65%, no rwma, Gr1 DD, midlly dil LA, PASP 29mmHg. No significant valvular dzs; b. 06/2017 Echo: EF 60-65%, Gr1DD, mildly dil LA.  . Essential hypertension   . GERD (gastroesophageal reflux disease)   . Gout   . History of appendectomy 06/07/2015  . History of kidney cancer 11/20/2018  . History of kidney stones    passed - 2  . Hyperlipidemia   . Lumbar spinal stenosis   . Myocardial infarction ([place])    06/2017  . Nonexudative age-related macular degeneration, bilateral, early dry stage 11/20/2018  . Obesity   . Prediabetes   . Pseudophakia of both eyes   . Retinal cyst    Past Surgical History:  Procedure Laterality Date  . ABDOMINAL HYSTERECTOMY    . APPENDECTOMY  06/07/2015  . CARDIAC CATHETERIZATION    . CATARACT EXTRACTION W/PHACO Left 04/24/2016   Procedure: CATARACT EXTRACTION PHACO AND INTRAOCULAR LENS PLACEMENT (IOC);  Surgeon: Birder Robson, MD;   Location: [facility] ORS;  Service: Ophthalmology;  Laterality: Left;  [place] 45.8 AP% 16.4 CDE 7.53 FLUID PACK LOT # [id number]  . CATARACT EXTRACTION W/PHACO Right 06/05/2016   Procedure: CATARACT EXTRACTION PHACO AND INTRAOCULAR LENS PLACEMENT (IOC);  Surgeon: Birder Robson, MD;  Location: [facility] ORS;  Service: Ophthalmology;  Laterality: Right;  [place] 25.9 AP% 21.9 CDE 5.68 Fluid pack lot # [id number]  . COLON RESECTION  03/04/1991  . COLON SURGERY    . COLONOSCOPY  03-14-10   Dr Bary Castilla, tubular adenoma at 25 cm.  . COLONOSCOPY WITH PROPOFOL N/A 06/07/2015   Procedure: COLONOSCOPY WITH PROPOFOL;  Surgeon: Robert Bellow, MD;  Location: [facility] ENDOSCOPY;  Service: Endoscopy;  Laterality: N/A;  . COLONOSCOPY WITH PROPOFOL N/A 08/10/2020   Procedure: COLONOSCOPY WITH PROPOFOL;  Surgeon: Robert Bellow, MD;  Location: [facility] ENDOSCOPY;  Service: Endoscopy;  Laterality: N/A;  . CORONARY STENT INTERVENTION N/A 06/28/2017   Procedure: CORONARY STENT INTERVENTION;  Surgeon: Yolonda Kida, MD;  Location: [place] CV LAB;  Service: Cardiovascular;  Laterality: N/A;  . EYE SURGERY     lens eye surgery  . HERNIA REPAIR  [id number]   umbilical  . LEFT HEART CATH AND CORONARY ANGIOGRAPHY N/A 06/28/2017   Procedure: LEFT HEART CATH AND CORONARY ANGIOGRAPHY;  Surgeon: Minna Merritts, MD;  Location: [facility]  INVASIVE CV LAB;  Service: Cardiovascular;  Laterality: N/A;  . LUMBAR LAMINECTOMY/DECOMPRESSION MICRODISCECTOMY Bilateral 03/10/2018   Procedure: Laminectomy and Foraminotomy - Lumbar three-Lumbar four - Lumbar four-Lumbar five - bilateral;  Surgeon: Earnie Larsson, MD;  Location: [place];  Service: Neurosurgery;  Laterality: Bilateral;  . NEPHRECTOMY  1992   left- cancer  . RIB RESECTION     removal 4 ribs  . TONSILLECTOMY      Allergies  Allergies  Allergen Reactions  . Morphine And Related Nausea Only  . Prednisone Nausea And Vomiting  . Latex Rash    History of Present Illness    75 year old female  with the above complex past medical history including hypertension, hyperlipidemia, renal cell carcinoma status post partial left nephrectomy, colon cancer, GERD, spinal stenosis, and coronary artery disease status post non-STEMI in August 2018.  Catheterization at that time showed severe OM1 disease with chronic total occlusion of the right coronary artery.  The OM1 was successfully treated with a drug-eluting stent.  She has done reasonably well over the past few years and was last seen via telemedicine visit in August 2021, at which time she was doing well and remained active as a Patent examiner.  Earlier this year, in the setting of lumbar stenosis with neurogenic claudication, low back pain, and difficulty with ambulation, she underwent MRI of the lower back showing progressive advanced disc degeneration at L3-4 and L4-5.  She has been followed by neurosurgery with initial recommendation for surgical mgmt, though [name] prefers a more conservative approach and will instead begin PT soon.  From a cardiac standpoint, she has continued to do well.  She works full-time and remains active at work.  She denies chest pain, dyspnea, palpitations, PND, orthopnea, dizziness, syncope, edema, or early satiety.  Home Medications    Prior to Admission medications   Medication Sig Start Date End Date Taking? Authorizing Provider  allopurinol (ZYLOPRIM) 100 MG tablet Take 100 mg by mouth as needed.    [provider]  aspirin EC 81 MG tablet Take 81 mg by mouth daily. Swallow whole.    [provider]  atorvastatin (LIPITOR) 80 MG tablet Take 1 tablet (80 mg total) by mouth daily. 12/15/20   Minna Merritts, MD  carvedilol (COREG) 12.5 MG tablet Take 1.5 tablets (18.75 mg total) by mouth 2 (two) times daily with a meal. 11/21/20   Gollan, Kathlene November, MD  clopidogrel (PLAVIX) 75 MG tablet Take 1 tablet by mouth once daily 03/15/21   Minna Merritts, MD  colchicine 0.6 MG tablet  TAKE 1 TABLET BY MOUTH THREE TIMES DAILY UNTIL GI UPSET OR FLARE SUBSIDES, THEN FOR MAINTENANCE TAKE 1 TABLET ONCE DAILY 11/16/20   Hyatt, Max T, DPM  esomeprazole (NEXIUM) 40 MG capsule Take 40 mg by mouth daily in the afternoon.    [provider]  isosorbide mononitrate (IMDUR) 30 MG 24 hr tablet Take 1 tablet (30 mg total) by mouth in the morning and at bedtime. 05/25/20   Loel Dubonnet, NP  lidocaine (LIDODERM) 5 % Place 1 patch onto the skin daily. Remove & Discard patch within 12 hours or as directed by MD    [provider]  Multiple Vitamins-Minerals (MULTIVITAMIN WITH MINERALS) tablet Take 1 tablet by mouth daily.    [provider]  nitroGLYCERIN (NITROSTAT) 0.4 MG SL tablet Place 1 tablet (0.4 mg total) under the tongue every 5 (five) minutes x 3 doses as needed for chest pain.  10/12/20   Minna Merritts, MD  traMADol (ULTRAM) 50 MG tablet Take 50 mg by mouth daily.  05/18/19   [provider]    Review of Systems    Ongoing low back pain, though this does not limit her significantly.  She denies chest pain, palpitations, dyspnea, pnd, orthopnea, n, v, dizziness, syncope, edema, weight gain, or early satiety.  All other systems reviewed and are otherwise negative except as noted above.  Physical Exam    VS:  BP (!) 158/90 (BP Location: Left Arm, Patient Position: Sitting, Cuff Size: Normal)   Pulse 64   Ht 5\' 10"  (1.778 m)   Wt 270 lb (122.5 kg)   SpO2 97%   BMI 38.74 kg/m  , BMI Body mass index is 38.74 kg/m. GEN: Obese, in no acute distress. HEENT: normal. Neck: Supple, no JVD, carotid bruits, or masses. Cardiac: RRR, no murmurs, rubs, or gallops. No clubbing, cyanosis, edema.  Radials/PT 2+ and equal bilaterally.  Respiratory:  Respirations regular and unlabored, clear to auscultation bilaterally. GI: Soft, nontender, nondistended, BS + x 4. MS: no deformity or atrophy. Skin: warm and dry, no rash. Neuro:  Strength and sensation  are intact. Psych: Normal affect.  Accessory Clinical Findings    ECG personally reviewed by me today -regular sinus rhythm, 64, right bundle branch block, inferior infarct- no acute changes.  Lab Results  Component Value Date   WBC 9.2 02/09/2021   HGB 14.9 02/09/2021   HCT 45.6 (H) 02/09/2021   MCV 89.1 02/09/2021   PLT 313 02/09/2021   Lab Results  Component Value Date   CREATININE 0.84 02/09/2021   BUN 14 02/09/2021   NA 142 02/09/2021   K 4.7 02/09/2021   CL 104 02/09/2021   CO2 30 02/09/2021   Lab Results  Component Value Date   ALT 24 02/09/2021   AST 20 02/09/2021   ALKPHOS 148 (H) 06/30/2020   BILITOT 0.8 02/09/2021   Lab Results  Component Value Date   CHOL 172 06/25/2019   HDL 45 06/25/2019   LDLCALC 100 (H) 06/25/2019   TRIG 135 06/25/2019   CHOLHDL 3.8 06/25/2019    Lab Results  Component Value Date   HGBA1C 5.8 (H) 06/27/2017    Assessment & Plan    1.  Coronary artery disease: Status post non-STEMI in 2018 with drug-eluting stent placement to the first obtuse marginal and finding of chronic total occlusion of the RCA.  She has continued to do well from a cardiac standpoint and remains reasonably active without symptoms or limitations.  She is continuing to work full-time.  She remains on statin, beta-blocker, aspirin, nitrate, and Plavix therapy.  2.  Essential hypertension: Blood pressure elevated today at 158/90.  She sometimes checks it at home and notes it is typically in the 140s.  In this setting, she has agreed to initiate amlodipine 2.5 mg daily on top of carvedilol and nitrate therapy.  I did consider losartan however, historically, potassium trends on the high side of normal.  She will follow her blood pressures at home and contact us within the next few weeks if she continues to trend greater than [id number] systolic.  3.  Hyperlipidemia: LDL of 100 in July 2020.  She is currently on atorvastatin 80 mg.  She did not tolerate Zetia secondary to  diarrhea.  She knows that she needs to lose weight and would like to try lifestyle modifications prior to considering PCSK9 inhibitor.  4.  Back pain with  spinal stenosis and degenerative disc disease: Being followed by neurosurgery with plan for PT.  Patient not currently interested in surgery.  5.  Disposition: Adding amlodipine today.  Patient will contact us in the next couple of weeks with blood pressures if she continues to trend greater than 024 systolic.  Otherwise, we will see her back in 3 to 4 months.  Murray Hodgkins, NP 04/12/2021, 3:34 PM

## 2021-04-12 NOTE — Patient Instructions (Signed)
Medication Instructions:   1.  START taking Amlodipine 2.5 MG once a day.   *If you need a refill on your cardiac medications before your next appointment, please call your pharmacy*   Lab Work: None ordered If you have labs (blood work) drawn today and your tests are completely normal, you will receive your results only by: Marland Kitchen MyChart Message (if you have MyChart) OR . A paper copy in the mail If you have any lab test that is abnormal or we need to change your treatment, we will call you to review the results.   Testing/Procedures: None ordered   Follow-Up: At [facility], you and your health needs are our priority.  As part of our continuing mission to provide you with exceptional heart care, we have created designated Provider Care Teams.  These Care Teams include your primary Cardiologist (physician) and Advanced Practice Providers (APPs -  Physician Assistants and Nurse Practitioners) who all work together to provide you with the care you need, when you need it.  We recommend signing up for the patient portal called "MyChart".  Sign up information is provided on this After Visit Summary.  MyChart is used to connect with patients for Virtual Visits (Telemedicine).  Patients are able to view lab/test results, encounter notes, upcoming appointments, etc.  Non-urgent messages can be sent to your provider as well.   To learn more about what you can do with MyChart, go to [email or web address].    Your next appointment:   3-4 months   The format for your next appointment:   In Person  Provider:   You may see Ida Rogue, MD or one of the following Advanced Practice Providers on your designated Care Team:    Murray Hodgkins, NP  Christell Faith, PA-C  Marrianne Mood, PA-C  Cadence St[place] Anthony, [place]  Laurann Montana, NP    Other Instructions

## 2021-04-25 ENCOUNTER — Ambulatory Visit (INDEPENDENT_AMBULATORY_CARE_PROVIDER_SITE_OTHER): Payer: Medicare Other | Admitting: Physician Assistant

## 2021-04-25 ENCOUNTER — Other Ambulatory Visit: Payer: Self-pay

## 2021-04-25 ENCOUNTER — Encounter: Payer: Self-pay | Admitting: Physician Assistant

## 2021-04-25 VITALS — BP 166/81 | HR 64 | Resp 16 | Ht 70.0 in | Wt 267.0 lb

## 2021-04-25 DIAGNOSIS — M19042 Primary osteoarthritis, left hand: Secondary | ICD-10-CM

## 2021-04-25 DIAGNOSIS — I214 Non-ST elevation (NSTEMI) myocardial infarction: Secondary | ICD-10-CM

## 2021-04-25 DIAGNOSIS — Z8639 Personal history of other endocrine, nutritional and metabolic disease: Secondary | ICD-10-CM

## 2021-04-25 DIAGNOSIS — Z5181 Encounter for therapeutic drug level monitoring: Secondary | ICD-10-CM | POA: Diagnosis not present

## 2021-04-25 DIAGNOSIS — D3131 Benign neoplasm of right choroid: Secondary | ICD-10-CM

## 2021-04-25 DIAGNOSIS — M1A09X0 Idiopathic chronic gout, multiple sites, without tophus (tophi): Secondary | ICD-10-CM

## 2021-04-25 DIAGNOSIS — M19072 Primary osteoarthritis, left ankle and foot: Secondary | ICD-10-CM

## 2021-04-25 DIAGNOSIS — H353131 Nonexudative age-related macular degeneration, bilateral, early dry stage: Secondary | ICD-10-CM

## 2021-04-25 DIAGNOSIS — Z87442 Personal history of urinary calculi: Secondary | ICD-10-CM

## 2021-04-25 DIAGNOSIS — M19071 Primary osteoarthritis, right ankle and foot: Secondary | ICD-10-CM

## 2021-04-25 DIAGNOSIS — Z85528 Personal history of other malignant neoplasm of kidney: Secondary | ICD-10-CM

## 2021-04-25 DIAGNOSIS — Z86010 Personal history of colonic polyps: Secondary | ICD-10-CM

## 2021-04-25 DIAGNOSIS — I10 Essential (primary) hypertension: Secondary | ICD-10-CM

## 2021-04-25 DIAGNOSIS — R011 Cardiac murmur, unspecified: Secondary | ICD-10-CM

## 2021-04-25 DIAGNOSIS — Z961 Presence of intraocular lens: Secondary | ICD-10-CM

## 2021-04-25 DIAGNOSIS — M19041 Primary osteoarthritis, right hand: Secondary | ICD-10-CM

## 2021-04-25 DIAGNOSIS — M48062 Spinal stenosis, lumbar region with neurogenic claudication: Secondary | ICD-10-CM

## 2021-04-25 MED ORDER — ALLOPURINOL 100 MG PO TABS: 200.0000 mg | ORAL_TABLET | Freq: Every day | ORAL | 2 refills | Status: DC

## 2021-04-26 ENCOUNTER — Ambulatory Visit (INDEPENDENT_AMBULATORY_CARE_PROVIDER_SITE_OTHER): Payer: Medicare Other | Admitting: Podiatry

## 2021-04-26 ENCOUNTER — Encounter: Payer: Self-pay | Admitting: Podiatry

## 2021-04-26 DIAGNOSIS — I2510 Atherosclerotic heart disease of native coronary artery without angina pectoris: Secondary | ICD-10-CM

## 2021-04-26 DIAGNOSIS — M1A0790 Idiopathic chronic gout, unspecified ankle and foot, without tophus (tophi): Secondary | ICD-10-CM | POA: Diagnosis not present

## 2021-04-26 DIAGNOSIS — M778 Other enthesopathies, not elsewhere classified: Secondary | ICD-10-CM

## 2021-04-26 LAB — CBC WITH DIFFERENTIAL/PLATELET
Absolute Monocytes: 527 cells/uL (ref 200–950)
Basophils Absolute: 39 cells/uL (ref 0–200)
Basophils Relative: 0.6 %
Eosinophils Absolute: 150 cells/uL (ref 15–500)
Eosinophils Relative: 2.3 %
HCT: 45.2 % — ABNORMAL HIGH (ref 35.0–45.0)
Hemoglobin: 14.8 g/dL (ref 11.7–15.5)
Lymphs Abs: 2171 cells/uL (ref 850–3900)
MCH: 29.1 pg (ref 27.0–33.0)
MCHC: 32.7 g/dL (ref 32.0–36.0)
MCV: 88.8 fL (ref 80.0–100.0)
MPV: 10.6 fL (ref 7.5–12.5)
Monocytes Relative: 8.1 %
Neutro Abs: 3614 cells/uL (ref 1500–7800)
Neutrophils Relative %: 55.6 %
Platelets: 275 10*3/uL (ref 140–400)
RBC: 5.09 10*6/uL (ref 3.80–5.10)
RDW: 12.8 % (ref 11.0–15.0)
Total Lymphocyte: 33.4 %
WBC: 6.5 10*3/uL (ref 3.8–10.8)

## 2021-04-26 LAB — COMPLETE METABOLIC PANEL WITH GFR
AG Ratio: 2 (calc) (ref 1.0–2.5)
ALT: 46 U/L — ABNORMAL HIGH (ref 6–29)
AST: 30 U/L (ref 10–35)
Albumin: 4.5 g/dL (ref 3.6–5.1)
Alkaline phosphatase (APISO): 127 U/L (ref 37–153)
BUN/Creatinine Ratio: 12 (calc) (ref 6–22)
BUN: 12 mg/dL (ref 7–25)
CO2: 28 mmol/L (ref 20–32)
Calcium: 10.5 mg/dL — ABNORMAL HIGH (ref 8.6–10.4)
Chloride: 108 mmol/L (ref 98–110)
Creat: 1.01 mg/dL — ABNORMAL HIGH (ref 0.60–0.93)
GFR, Est African American: 63 mL/min/{1.73_m2} (ref >=60)
GFR, Est Non African American: 54 mL/min/{1.73_m2} — ABNORMAL LOW (ref >=60)
Globulin: 2.3 g/dL (calc) (ref 1.9–3.7)
Glucose, Bld: 87 mg/dL (ref 65–99)
Potassium: 5.2 mmol/L (ref 3.5–5.3)
Sodium: 146 mmol/L (ref 135–146)
Total Bilirubin: 0.6 mg/dL (ref 0.2–1.2)
Total Protein: 6.8 g/dL (ref 6.1–8.1)

## 2021-04-26 LAB — URIC ACID: Uric Acid, Serum: 7.5 mg/dL — ABNORMAL HIGH (ref 2.5–7.0)

## 2021-04-26 MED ORDER — TRIAMCINOLONE ACETONIDE 40 MG/ML IJ SUSP
40.0000 mg | Freq: Once | INTRAMUSCULAR | Status: AC
  Administered 2021-04-26: 40 mg

## 2021-04-26 MED ORDER — METHYLPREDNISOLONE 4 MG PO TBPK: ORAL_TABLET | ORAL | 0 refills | Status: DC

## 2021-04-26 NOTE — Progress Notes (Signed)
She presents today for follow-up of her gout states that the left foot is most painful flared up last Thursday night has been trying to take the colchicine but made her stomach upset.  She continues to take her allopurinol on a regular basis.  States that the right one is starting become a little tender.  Objective: Vital signs are stable alert oriented x3.  There is no erythema edema cellulitis drainage or odor to the right foot though tender on palpation of the first metatarsophalangeal joint.  The left foot however does demonstrate erythema and edema to the first metatarsophalangeal joint extending laterally but not proximally.  Assessment gouty arthritis first metatarsophalangeal joints bilateral left is acute.  Plan: Discussed etiology pathology and surgical therapies at this point time injected periarticular today 20 mg Kenalog 5 mg Marcaine point maximal tenderness.  Start her on a Medrol Dosepak she will continue the use of her allopurinol follow-up with me in a few weeks.

## 2021-04-26 NOTE — Progress Notes (Signed)
CBC WNL.  Creatinine is borderline elevated and GFR has dropped slightly-54.  Please advise the patient to try to avoid taking NSAIDs.  ALT is slightly elevated-46 so she should avoid tylenol and alcohol use.  Uric acid is elevated-7.5. Ideally her uric acid level should be less than 6.  Reviewed lab work with Dr. Estanislado Pandy.  She agrees that the patient should increase the dose of allopurinol since she continues to recurrent flares.

## 2021-04-26 NOTE — Progress Notes (Signed)
Recommend repeating CMP in 1 month.

## 2021-04-27 ENCOUNTER — Telehealth: Payer: Self-pay | Admitting: *Deleted

## 2021-04-27 DIAGNOSIS — Z5181 Encounter for therapeutic drug level monitoring: Secondary | ICD-10-CM

## 2021-04-27 NOTE — Telephone Encounter (Signed)
-----   Message from Ofilia Neas, PA-C sent at 04/26/2021  4:46 PM EDT ----- CBC WNL.  Creatinine is borderline elevated and GFR has dropped slightly-54.  Please advise the patient to try to avoid taking NSAIDs.  ALT is slightly elevated-46 so she should avoid tylenol and alcohol use.  Uric acid is elevated-7.5. Ideally her u ric acid level should be less than 6.  Reviewed lab work with Dr. Estanislado Pandy.  She agrees that the patient should increase the dose of allopurinol since she continues to recurrent flares.

## 2021-05-05 ENCOUNTER — Telehealth: Payer: Self-pay | Admitting: Nurse Practitioner

## 2021-05-05 NOTE — Telephone Encounter (Signed)
Patient returning call.

## 2021-05-05 NOTE — Telephone Encounter (Signed)
Pt c/o BP issue: STAT if pt c/o blurred vision, one-sided weakness or slurred speech  1. What are your last 5 BP readings? 184/115 78 pulse   2. Are you having any other symptoms (ex. Dizziness, headache, blurred vision, passed out)? Fuzzy alittle dizzy & uneasy  3. What is your BP issue? no

## 2021-05-05 NOTE — Telephone Encounter (Signed)
Left voicemail message to call back for review of concerns.

## 2021-05-06 ENCOUNTER — Other Ambulatory Visit: Payer: Self-pay

## 2021-05-06 ENCOUNTER — Emergency Department
Admission: EM | Admit: 2021-05-06 | Discharge: 2021-05-06 | Disposition: A | Discharge status: Home / Self Care | Payer: Medicare Other | Attending: Emergency Medicine | Admitting: Emergency Medicine

## 2021-05-06 ENCOUNTER — Emergency Department: Payer: Medicare Other

## 2021-05-06 DIAGNOSIS — R519 Headache, unspecified: Secondary | ICD-10-CM | POA: Insufficient documentation

## 2021-05-06 DIAGNOSIS — Z7982 Long term (current) use of aspirin: Secondary | ICD-10-CM | POA: Diagnosis not present

## 2021-05-06 DIAGNOSIS — Z79899 Other long term (current) drug therapy: Secondary | ICD-10-CM | POA: Diagnosis not present

## 2021-05-06 DIAGNOSIS — I10 Essential (primary) hypertension: Secondary | ICD-10-CM

## 2021-05-06 DIAGNOSIS — Z85038 Personal history of other malignant neoplasm of large intestine: Secondary | ICD-10-CM | POA: Insufficient documentation

## 2021-05-06 DIAGNOSIS — I252 Old myocardial infarction: Secondary | ICD-10-CM | POA: Diagnosis not present

## 2021-05-06 DIAGNOSIS — Z8616 Personal history of COVID-19: Secondary | ICD-10-CM | POA: Diagnosis not present

## 2021-05-06 DIAGNOSIS — Z9861 Coronary angioplasty status: Secondary | ICD-10-CM | POA: Insufficient documentation

## 2021-05-06 DIAGNOSIS — Z85520 Personal history of malignant carcinoid tumor of kidney: Secondary | ICD-10-CM | POA: Insufficient documentation

## 2021-05-06 DIAGNOSIS — I2510 Atherosclerotic heart disease of native coronary artery without angina pectoris: Secondary | ICD-10-CM | POA: Diagnosis not present

## 2021-05-06 LAB — CBC WITH DIFFERENTIAL/PLATELET
Abs Immature Granulocytes: 0.02 10*3/uL (ref 0.00–0.07)
Basophils Absolute: 0.1 10*3/uL (ref 0.0–0.1)
Basophils Relative: 1 %
Eosinophils Absolute: 0.1 10*3/uL (ref 0.0–0.5)
Eosinophils Relative: 2 %
HCT: 45.3 % (ref 36.0–46.0)
Hemoglobin: 15.1 g/dL — ABNORMAL HIGH (ref 12.0–15.0)
Immature Granulocytes: 0 %
Lymphocytes Relative: 25 %
Lymphs Abs: 1.8 10*3/uL (ref 0.7–4.0)
MCH: 29.7 pg (ref 26.0–34.0)
MCHC: 33.3 g/dL (ref 30.0–36.0)
MCV: 89 fL (ref 80.0–100.0)
Monocytes Absolute: 0.5 10*3/uL (ref 0.1–1.0)
Monocytes Relative: 7 %
Neutro Abs: 4.9 10*3/uL (ref 1.7–7.7)
Neutrophils Relative %: 65 %
Platelets: 250 10*3/uL (ref 150–400)
RBC: 5.09 MIL/uL (ref 3.87–5.11)
RDW: 13 % (ref 11.5–15.5)
WBC: 7.5 10*3/uL (ref 4.0–10.5)
nRBC: 0 % (ref 0.0–0.2)

## 2021-05-06 LAB — BASIC METABOLIC PANEL
Anion gap: 6 (ref 5–15)
BUN: 16 mg/dL (ref 8–23)
CO2: 26 mmol/L (ref 22–32)
Calcium: 9.9 mg/dL (ref 8.9–10.3)
Chloride: 109 mmol/L (ref 98–111)
Creatinine, Ser: 0.96 mg/dL (ref 0.44–1.00)
GFR, Estimated: >60 mL/min (ref >60)
Glucose, Bld: 123 mg/dL — ABNORMAL HIGH (ref 70–99)
Potassium: 4.4 mmol/L (ref 3.5–5.1)
Sodium: 141 mmol/L (ref 135–145)

## 2021-05-06 NOTE — ED Provider Notes (Signed)
[facility] Emergency Department Provider Note  Time seen: 10:01 AM  I have reviewed the triage vital signs and the nursing notes.   HISTORY  Chief Complaint Hypertension and Headache   HPI Carrie Larsen is a 75 y.o. female with a past medical history of anxiety, CAD, gastric reflux, hypertension, hyperlipidemia, presents to the emergency department for high blood pressure.  According to the patient for the past 3 days she has noticed her blood pressures been elevated as high as 630 systolic.  Patient states she has been under tremendous amount of stress recently at her job and believes this could be the cause.  She states this morning she had a headache which she rarely ever gets.  Patient was concerned given the headache so she came to the emergency department for evaluation.  States the headache is largely resolved, blood pressure in the emergency department 139/60.   Past Medical History:  Diagnosis Date   Anxiety    Arthritis    osteoarthritis   CAD (coronary artery disease)    a. 2012 ETT: no ischemia;  b. 06/2017 NSTEMI/PCI: LM nl, LAD nl, D1 70-80p, LCX nl, OM1 90 (3.0 x 23 Xience [place]), RCA dominant, 100p/m, fills via L->R collats, EF 55-65%.   Cancer [facility])    a. s/p partial left nephrectomy.   Chicken pox    CME (cystoid macular edema), left 11/20/2018   Colon cancer ([place]) 1992   T3, N1, M0. colon    Colon polyp 2011   COVID-19 16/0109   Diastolic dysfunction    a. 08/2015 Echo: EF 60-65%, no rwma, Gr1 DD, midlly dil LA, PASP 74mmHg. No significant valvular dzs; b. 06/2017 Echo: EF 60-65%, Gr1DD, mildly dil LA.   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   CME (cystoid macular edema), left 11/20/2018   Nonexudative age-related macular degeneration, bilateral, early dry stage 11/20/2018   Pseudophakia of both eyes 11/20/2018   Olecranon bursitis 10/16/2018   Contusion of knee 10/16/2018   Arthritis of knee 10/16/2018   Arthritis of hand 10/16/2018   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/10/2018   Morbid obesity ([place]) 09/11/2017   Non-STEMI (non-ST elevated myocardial infarction) ([place]) 06/27/2017   Abdominal pain 02/28/2017   History of renal cell carcinoma 02/20/2017   Knee pain 08/14/2016   Strain of hamstring muscle 08/08/2016   Left leg swelling 32/35/5732   Systolic murmur    Retrosternal chest pain 07/08/2015   History of colonic polyps 04/16/2015   Chest pain 03/08/2011   Hyperlipidemia 03/08/2011   HTN (hypertension) 03/08/2011    Past Surgical History:  Procedure Laterality Date   ABDOMINAL HYSTERECTOMY     APPENDECTOMY  06/07/2015   CARDIAC CATHETERIZATION     CATARACT EXTRACTION W/PHACO Left 04/24/2016   Procedure: CATARACT EXTRACTION PHACO AND INTRAOCULAR LENS PLACEMENT ([place]);  Surgeon: Birder Robson,  MD;  Location: [facility] ORS;  Service: Ophthalmology;  Laterality: Left;  [place] 45.8 AP% 16.4 CDE 7.53 FLUID PACK LOT # [id number]   CATARACT EXTRACTION W/PHACO Right 06/05/2016   Procedure: CATARACT EXTRACTION PHACO AND INTRAOCULAR LENS PLACEMENT ([facility]);  Surgeon: Birder Robson, MD;  Location: [facility] ORS;  Service: Ophthalmology;  Laterality: Right;  [place] 25.9 AP% 21.9 CDE 5.68 Fluid pack lot # [id number]   COLON RESECTION  03/04/1991   COLON SURGERY     COLONOSCOPY  03-14-10   Dr Bary Castilla, tubular adenoma at 25 cm.   COLONOSCOPY WITH PROPOFOL N/A 06/07/2015    Procedure: COLONOSCOPY WITH PROPOFOL;  Surgeon: Robert Bellow, MD;  Location[facility] ENDOSCOPY;  Service: Endoscopy;  Laterality: N/A;   COLONOSCOPY WITH PROPOFOL N/A 08/10/2020   Procedure: COLONOSCOPY WITH PROPOFOL;  Surgeon: Robert Bellow, MD;  Location: [facility] ENDOSCOPY;  Service: Endoscopy;  Laterality: N/A;   CORONARY STENT INTERVENTION N/A 06/28/2017   Procedure: CORONARY STENT INTERVENTION;  Surgeon: Yolonda Kida, MD;  Location: [place] CV LAB;  Service: Cardiovascular;  Laterality: N/A;   EYE SURGERY     lens eye surgery   HERNIA REPAIR  0017   umbilical   LEFT HEART CATH AND CORONARY ANGIOGRAPHY N/A 06/28/2017   Procedure: LEFT HEART CATH AND CORONARY ANGIOGRAPHY;  Surgeon: Minna Merritts, MD;  Location: [place] CV LAB;  Service: Cardiovascular;  Laterality: N/A;   LUMBAR LAMINECTOMY/DECOMPRESSION MICRODISCECTOMY Bilateral 03/10/2018   Procedure: Laminectomy and Foraminotomy - Lumbar three-Lumbar four - Lumbar four-Lumbar five - bilateral;  Surgeon: Earnie Larsson, MD;  Location: [place];  Service: Neurosurgery;  Laterality: Bilateral;   NEPHRECTOMY  1992   left- cancer   RIB RESECTION     removal 4 ribs   TONSILLECTOMY      Prior to Admission medications   Medication Sig Start Date End Date Taking? Authorizing Provider  allopurinol (ZYLOPRIM) 100 MG tablet Take 2 tablets (200 mg total) by mouth daily. 04/25/21   Ofilia Neas, PA-C  amLODipine (NORVASC) 2.5 MG tablet Take 1 tablet (2.5 mg total) by mouth daily. 04/12/21 07/11/21  Theora Gianotti, NP  aspirin EC 81 MG tablet Take 81 mg by mouth daily. Swallow whole.    [provider]  atorvastatin (LIPITOR) 80 MG tablet Take 1 tablet (80 mg total) by mouth daily. 12/15/20   Minna Merritts, MD  carvedilol (COREG) 12.5 MG tablet Take 1.5 tablets (18.75 mg total) by mouth 2 (two) times daily with a meal. 11/21/20   Gollan, Kathlene November, MD  clopidogrel (PLAVIX) 75 MG tablet Take 1 tablet by mouth once  daily 03/15/21   Minna Merritts, MD  colchicine 0.6 MG tablet TAKE 1 TABLET BY MOUTH THREE TIMES DAILY UNTIL GI UPSET OR FLARE SUBSIDES, THEN FOR MAINTENANCE TAKE 1 TABLET ONCE DAILY 11/16/20   Hyatt, Max T, DPM  esomeprazole (NEXIUM) 40 MG capsule Take 40 mg by mouth daily in the afternoon.    [provider]  gabapentin (NEURONTIN) 100 MG capsule Take 100 mg by mouth 3 (three) times daily as needed. 03/27/21   [provider]  isosorbide mononitrate (IMDUR) 30 MG 24 hr tablet Take 1 tablet (30 mg total) by mouth in the morning and at bedtime. 05/25/20   Loel Dubonnet, NP  lidocaine (LIDODERM) 5 % Place 1 patch onto the skin daily. Remove & Discard patch within 12 hours or as directed by MD    [provider]  methylPREDNISolone (MEDROL DOSEPAK) 4  MG TBPK tablet 6 day dose pack - take as directed 04/26/21   Hyatt, Max T, DPM  Multiple Vitamins-Minerals (MULTIVITAMIN WITH MINERALS) tablet Take 1 tablet by mouth daily.    [provider]  nitroGLYCERIN (NITROSTAT) 0.4 MG SL tablet Place 1 tablet (0.4 mg total) under the tongue every 5 (five) minutes x 3 doses as needed for chest pain. 10/12/20   Minna Merritts, MD  traMADol (ULTRAM) 50 MG tablet Take 50 mg by mouth daily.  05/18/19   [provider]    Allergies  Allergen Reactions   Morphine And Related Nausea Only   Prednisone Nausea And Vomiting   Latex Rash    Family History  Problem Relation Age of Onset   Colon cancer Mother    Cerebral aneurysm Father    Heart attack Father    Heart attack Brother 90   Healthy Son     Social History Social History   Tobacco Use   Smoking status: Never   Smokeless tobacco: Never  Vaping Use   Vaping Use: Never used  Substance Use Topics   Alcohol use: No   Drug use: No    Review of Systems Constitutional: Negative for fever Cardiovascular: Negative for chest pain. Respiratory: Negative for shortness of breath. Gastrointestinal: Negative  for abdominal pain Musculoskeletal: Negative for musculoskeletal complaints Skin: Negative for skin complaints  Neurological: Moderate headache, now resolved. All other ROS negative  ____________________________________________   PHYSICAL EXAM:  VITAL SIGNS: ED Triage Vitals  Enc Vitals Group     BP 05/06/21 0720 139/60     Pulse Rate 05/06/21 0720 63     Resp 05/06/21 0720 16     Temp 05/06/21 0720 98.3 F (36.8 C)     Temp Source 05/06/21 0720 Oral     SpO2 05/06/21 0720 95 %     Weight 05/06/21 0720 266 lb 12.1 oz (121 kg)     Height 05/06/21 0720 5\' 10"  (1.778 m)     Head Circumference --      Peak Flow --      Pain Score 05/06/21 0719 0     Pain Loc --      Pain Edu? --      Excl. in [place]? --    Constitutional: Alert and oriented. Well appearing and in no distress. Eyes: Normal exam ENT      Head: Normocephalic and atraumatic.      Mouth/Throat: Mucous membranes are moist. Cardiovascular: Normal rate, regular rhythm. Respiratory: Normal respiratory effort without tachypnea nor retractions. Breath sounds are clear  Gastrointestinal: Soft and nontender. No distention.   Musculoskeletal: Nontender with normal range of motion in all extremities.  Neurologic:  Normal speech and language. No gross focal neurologic deficits Skin:  Skin is warm, dry and intact.  Psychiatric: Mood and affect are normal.   ____________________________________________    EKG  EKG viewed and interpreted by myself shows sinus bradycardia 59 bpm with a borderline widened QRS, normal axis, normal intervals, nonspecific ST changes.  ____________________________________________    RADIOLOGY  CT scan head is negative.  ____________________________________________   INITIAL IMPRESSION / ASSESSMENT AND PLAN / ED COURSE  Pertinent labs & imaging results that were available during my care of the patient were reviewed by me and considered in my medical decision making (see chart for  details).   Patient presents emergency department for hypertension and headache.  According to the patient for the past 3 days her blood pressures been elevated  as high as 190.  Patient states she is been taking all of her medications.  States she has been under tremendous amount of stress recently at her job.  Patient awoke with a headache in the back of her head this morning which is atypical so she came to the emergency department.  Patient states headache is since resolved.  Patient's blood pressure in the emergency department is reassuring 139/60.  Lab work is largely [place].  We will obtain a CT scan of the head as a precaution.  As long as the CT scan is normal anticipate likely discharge home with PCP follow-up.  CT scan of the head is negative.  We will discharge the patient home with PCP follow-up.  Patient agreeable to plan of care.  Graycie Halley was evaluated in Emergency Department on 05/06/2021 for the symptoms described in the history of present illness. She was evaluated in the context of the global COVID-19 pandemic, which necessitated consideration that the patient might be at risk for infection with the SARS-CoV-2 virus that causes COVID-19. Institutional protocols and algorithms that pertain to the evaluation of patients at risk for COVID-19 are in a state of rapid change based on information released by regulatory bodies including the CDC and federal and state organizations. These policies and algorithms were followed during the patient's care in the ED.  ____________________________________________   FINAL CLINICAL IMPRESSION(S) / ED DIAGNOSES  Hypertension Headache   Harvest Dark, MD 05/06/21 1112

## 2021-05-06 NOTE — ED Triage Notes (Signed)
Pt BIB [facility] for hypertension. Pt advised her BP has been running high for 2-3 days but yesterday she had some cooling sensation in both upper and lower extremities. Pt also complains of headache that woke her up this morning with some minor dizziness. Pt does have HX of HTN and takes her meds daily as prescribed. Pt in NAD.

## 2021-05-06 NOTE — ED Notes (Signed)
Pt at CT

## 2021-05-06 NOTE — ED Notes (Signed)
Pt presents to ED via EMS with c/o of hypertension at home and a headache in the back of her head and dizziness.   Pt denies headache or dizziness at this time. Pt states she takes a few different BP meds and pt states she took them this morning at 0600.   Pt denies headache or dizziness at this time. Pt denies SOB or chest pain. Pt is A&Ox4. NAD noted.

## 2021-05-06 NOTE — ED Notes (Addendum)
D/C discussed with pt, pt verbalized understanding. NAD noted. Pt demes any issues at this time. Pt ambulatory with steady gait and use of cane to restroom, pt escorted from restroom to [place] via wheelchair.

## 2021-05-09 ENCOUNTER — Encounter: Payer: Self-pay | Admitting: Cardiovascular Disease

## 2021-05-09 DIAGNOSIS — L089 Local infection of the skin and subcutaneous tissue, unspecified: Secondary | ICD-10-CM | POA: Insufficient documentation

## 2021-05-09 NOTE — Telephone Encounter (Signed)
Left voicemail message to call back for review of her concerns.

## 2021-05-11 NOTE — Telephone Encounter (Signed)
Patient called 911 last Saturday due to elevated blood pressures. They did take her to ED and they did let her go home. She reports having gout with Cortizone injections last Wednesday June 1 st. So she feels this may have caused some of this as well. She reports that her blood pressures are back down to normal now and is now better. She got a new blood pressure cuff as well and reports that readings are better. Instructed her to continue monitoring and if she has further increased numbers or concerns to please give [place] a call back. She was appreciative for the call with no further questions or concerns at this time.

## 2021-07-06 ENCOUNTER — Telehealth: Payer: Self-pay

## 2021-07-06 NOTE — Telephone Encounter (Signed)
Patient left a voicemail stating "I was referred to the office because I can't walk and have a lot of joint pain.  Every time I come there you concentrate on my gout and I have back pain, knee pain, leg pain, finger pain and nobody got to the rheumatology part.  I came thinking I would get help with the pain in my knees and legs, but you don't concentrate on the reason I was referred and its not worth the drive so I won't be coming back."

## 2021-07-07 NOTE — Telephone Encounter (Signed)
Patient advised she is seeing a neurosurgeon for her lower back pain.  She never addressed knee joint pain during the visits.  We have discussed her feet pain and also did x-rays of her feet in the past.  Patient advised if she would like to schedule an appointment for the evaluation of her knee joint pain then we can schedule an appointment.  Otherwise she can see an orthopedic surgeon. Patient states her feet are hurting really bad and she is in so much pain. Patient states she is taking Colchicine daily. Patient advised we do not prescribed pain medication. Patient states that's fine she will go somewhere else. Patient states she does not want to drive to [place].

## 2021-07-07 NOTE — Telephone Encounter (Signed)
She is seeing a neurosurgeon for her lower back pain.  She never addressed knee joint pain during the visits.  We have discussed her feet pain and also did x-rays of her feet in the past.  If she would like to schedule an appointment for the evaluation of her knee joint pain then we can schedule an appointment.  Otherwise she can see an orthopedic surgeon.

## 2021-07-07 NOTE — Telephone Encounter (Signed)
Attempted to contact the patient and left message for patient to call the office.  

## 2021-07-18 ENCOUNTER — Ambulatory Visit: Payer: Medicare Other | Admitting: Cardiovascular Disease

## 2021-07-18 ENCOUNTER — Telehealth: Payer: Self-pay | Admitting: Cardiovascular Disease

## 2021-07-18 DIAGNOSIS — R002 Palpitations: Secondary | ICD-10-CM

## 2021-07-18 NOTE — Telephone Encounter (Signed)
Pt c/o of Chest Pain: STAT if CP now or developed within 24 hours  1. Are you having CP right now? Not a pain more like a chest fluttering   2. Are you experiencing any other symptoms (ex. SOB, nausea, vomiting, sweating)? no  3. How long have you been experiencing CP? Started last Friday 08/19  4. Is your CP continuous or coming and going? Comes and goes but short breaks in between   5. Have you taken Nitroglycerin? Has some, never taken  ?

## 2021-07-18 NOTE — Telephone Encounter (Signed)
I spoke with the patient. She states on Friday 8/19, she was eating dinner and started to feel a fluttering feeling in her chest- "it feels like something is crawling around in my chest." She advised this was intermittent for her Friday - Sunday, but she felt it more times than not.  Sunday afternoon, the feeling subsided, but it is back again today.  HR's are controlled in the 60-70's. SBP is 135-140 (high end when she is stressed).  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  He has been having a really bad week and been screaming a lot. She advised she is currently at the home now as the PCP has increased her patient's ativan and she is fixing this for him.  I inquired if her patient's behavior started to worsen last Friday when her symptoms started and she advised "it was out of the blue." She feels she may have some anxiety related to her work.   I have advised her that the sensation she is feeling under her chest may be anxiety related. She does not have a history of arrhythmias.   I have advised the patient I will review with Ignacia Bayley, NP and we will call her back tomorrow.  She is aware that an option may be a heart monitor, but will review with Gerald Stabs and call back with further recommendations.  The patient voices understanding and is agreeable.

## 2021-07-18 NOTE — Telephone Encounter (Signed)
Reasonable to offer a 14 day zio, though if she'd like to hold off and see if Ss recur, that is just as reasonable.

## 2021-07-19 ENCOUNTER — Ambulatory Visit: Payer: Medicare Other

## 2021-07-19 DIAGNOSIS — R002 Palpitations: Secondary | ICD-10-CM

## 2021-07-19 NOTE — Telephone Encounter (Signed)
Attempted to call the patient. No answer- I left a message to please call back.  

## 2021-07-19 NOTE — Telephone Encounter (Signed)
Patient returning call.

## 2021-07-19 NOTE — Telephone Encounter (Signed)
Attempted to call the patient. No answer- I left a message to please call back before 12:30 pm or after 12:45 pm.

## 2021-07-19 NOTE — Telephone Encounter (Signed)
I spoke with the patient regarding Carrie Bayley, NP's recommendations as stated below.  The patient is in favor of wearing a ZIO XT heart monitor x 14 days. She would like this mailed to her.  She is advised no showers for the 1st 24 hours of wear. No submerging this under water. Do not get excessively sweaty.  The patient voices understanding and is agreeable.  I have confirmed her mailing address with her.  ZIO XT ordered to be shipped to the patient.

## 2021-07-25 ENCOUNTER — Other Ambulatory Visit: Payer: Self-pay | Admitting: Cardiovascular Disease

## 2021-07-25 ENCOUNTER — Ambulatory Visit: Payer: Medicare Other | Admitting: Rheumatology

## 2021-08-17 ENCOUNTER — Other Ambulatory Visit: Payer: Self-pay

## 2021-08-17 ENCOUNTER — Ambulatory Visit (INDEPENDENT_AMBULATORY_CARE_PROVIDER_SITE_OTHER): Payer: Medicare Other | Admitting: Nurse Practitioner

## 2021-08-17 ENCOUNTER — Encounter: Payer: Self-pay | Admitting: Nurse Practitioner

## 2021-08-17 VITALS — BP 140/86 | HR 55 | Ht 70.0 in | Wt 267.0 lb

## 2021-08-17 DIAGNOSIS — I2510 Atherosclerotic heart disease of native coronary artery without angina pectoris: Secondary | ICD-10-CM

## 2021-08-17 DIAGNOSIS — R002 Palpitations: Secondary | ICD-10-CM | POA: Diagnosis not present

## 2021-08-17 DIAGNOSIS — E785 Hyperlipidemia, unspecified: Secondary | ICD-10-CM

## 2021-08-17 DIAGNOSIS — I10 Essential (primary) hypertension: Secondary | ICD-10-CM

## 2021-08-17 MED ORDER — ISOSORBIDE MONONITRATE ER 30 MG PO TB24: 30.0000 mg | ORAL_TABLET | Freq: Every day | ORAL | 3 refills | Status: DC

## 2021-08-17 MED ORDER — AMLODIPINE BESYLATE 2.5 MG PO TABS: 2.5000 mg | ORAL_TABLET | Freq: Every day | ORAL | 3 refills | Status: DC

## 2021-08-17 MED ORDER — CARVEDILOL 12.5 MG PO TABS: 12.5000 mg | ORAL_TABLET | Freq: Every day | ORAL | 6 refills | Status: DC

## 2021-08-17 NOTE — Progress Notes (Signed)
Office Visit    Patient Name: Carrie Larsen Date of Encounter: 08/17/2021  Primary Care Provider:  Rutherford Limerick, PA Primary Cardiologist:  Ida Rogue, MD  Chief Complaint    75 year old female with history of CAD status post non-STEMI and OM1 stenting in August 2018, hypertension, hyperlipidemia, renal cell carcinoma status post partial left nephrectomy, colon cancer, obesity, GERD, and degenerative disc disease with spinal stenosis, who presents for follow-up related to CAD and hypertension.  Past Medical History    Past Medical History:  Diagnosis Date   Anxiety    Arthritis    osteoarthritis   CAD (coronary artery disease)    a. 2012 ETT: no ischemia;  b. 06/2017 NSTEMI/PCI: LM nl, LAD nl, D1 70-80p, LCX nl, OM1 90 (3.0 x 23 Xience [place]), RCA dominant, 100p/m, fills via L->R collats, EF 55-65%.   Cancer [facility])    a. s/p partial left nephrectomy.   Chicken pox    CME (cystoid macular edema), left 11/20/2018   Colon cancer ([place]) 1992   T3, N1, M0. colon    Colon polyp 2011   COVID-19 42/7062   Diastolic dysfunction    a. 08/2015 Echo: EF 60-65%, no rwma, Gr1 DD, midlly dil LA, PASP 51mmHg. No significant valvular dzs; b. 06/2017 Echo: EF 60-65%, Gr1DD, mildly dil LA.   Essential hypertension    GERD (gastroesophageal reflux disease)    Gout    History of appendectomy 06/07/2015   History of kidney cancer 11/20/2018   History of kidney stones    passed - 2   Hyperlipidemia    Lumbar spinal stenosis    Myocardial infarction ([place])    06/2017   Nonexudative age-related macular degeneration, bilateral, early dry stage 11/20/2018   Obesity    Prediabetes    Pseudophakia of both eyes    Retinal cyst    Past Surgical History:  Procedure Laterality Date   ABDOMINAL HYSTERECTOMY     APPENDECTOMY  06/07/2015   CARDIAC CATHETERIZATION     CATARACT EXTRACTION W/PHACO Left 04/24/2016   Procedure: CATARACT EXTRACTION PHACO AND INTRAOCULAR LENS PLACEMENT ([place]);   Surgeon: Birder Robson, MD;  Location: [facility] ORS;  Service: Ophthalmology;  Laterality: Left;  [place] 45.8 AP% 16.4 CDE 7.53 FLUID PACK LOT # [id number]   CATARACT EXTRACTION W/PHACO Right 06/05/2016   Procedure: CATARACT EXTRACTION PHACO AND INTRAOCULAR LENS PLACEMENT ([facility]);  Surgeon: Birder Robson, MD;  Location: [facility] ORS;  Service: Ophthalmology;  Laterality: Right;  [place] 25.9 AP% 21.9 CDE 5.68 Fluid pack lot # [id number]   COLON RESECTION  03/04/1991   COLON SURGERY     COLONOSCOPY  03-14-10   Dr Bary Castilla, tubular adenoma at 25 cm.   COLONOSCOPY WITH PROPOFOL N/A 06/07/2015   Procedure: COLONOSCOPY WITH PROPOFOL;  Surgeon: Robert Bellow, MD;  Location: [facility] ENDOSCOPY;  Service: Endoscopy;  Laterality: N/A;   COLONOSCOPY WITH PROPOFOL N/A 08/10/2020   Procedure: COLONOSCOPY WITH PROPOFOL;  Surgeon: Robert Bellow, MD;  Location: [facility] ENDOSCOPY;  Service: Endoscopy;  Laterality: N/A;   CORONARY STENT INTERVENTION N/A 06/28/2017   Procedure: CORONARY STENT INTERVENTION;  Surgeon: Yolonda Kida, MD;  Location: [place] CV LAB;  Service: Cardiovascular;  Laterality: N/A;   EYE SURGERY     lens eye surgery   HERNIA REPAIR  [id number]   umbilical   LEFT HEART CATH AND CORONARY ANGIOGRAPHY N/A 06/28/2017   Procedure: LEFT HEART CATH AND CORONARY ANGIOGRAPHY;  Surgeon: Minna Merritts, MD;  Location: [facility]  INVASIVE CV LAB;  Service: Cardiovascular;  Laterality: N/A;   LUMBAR LAMINECTOMY/DECOMPRESSION MICRODISCECTOMY Bilateral 03/10/2018   Procedure: Laminectomy and Foraminotomy - Lumbar three-Lumbar four - Lumbar four-Lumbar five - bilateral;  Surgeon: Earnie Larsson, MD;  Location: [place];  Service: Neurosurgery;  Laterality: Bilateral;   NEPHRECTOMY  1992   left- cancer   RIB RESECTION     removal 4 ribs   TONSILLECTOMY      Allergies  Allergies  Allergen Reactions   Morphine And Related Nausea Only   Prednisone Nausea And Vomiting   Latex Rash    History of Present Illness     75 year old female with the above complex past medical history including hypertension, hyperlipidemia, renal cell carcinoma status post partial left nephrectomy, colon cancer, GERD, spinal stenosis, and coronary artery disease status post non-STEMI in August 2018.  Catheterization at that time showed severe OM1 disease with chronic total occlusion of the right coronary artery.  The OM1 was successfully treated with a drug-eluting stent.  At her last visit in May, she is doing well from a cardiac standpoint.  She was dealing with back pain in the setting of spinal stenosis and degenerative disc disease for which she was going to undergo physical therapy.  She is not interested in surgery.  She was hypertensive and amlodipine 2.5 mg daily was added.    In August, patient contacted our office related to palpitations described as feeling "like something is crawling around in my chest."  A Zio monitor was ordered however, [name] notes that symptoms ceased w/in a day of calling our office, and opted not to proceed w/ monitoring.  Since then, she has felt well.  She has had no recurrent palpitations.  She remains somewhat active, working as a Chemical engineer for a private client.  She is not routinely exercising but is able to complete usual activities without symptoms or limitations.  She denies chest pain, dyspnea, PND, orthopnea, dizziness, syncope, edema, or early satiety.  She notes that she has been taking some of her medications incorrectly.  Carvedilol was prescribed 18.75 mg twice daily but she has only been taking 12.5 mg twice daily.  Isosorbide is prescribed 30 mg twice daily, but she is only been taking 30 mg once daily.  Though amlodipine was prescribed at her last visit, she has not been taking it and is not sure if she ever picked it up from the pharmacy.  Home Medications    Current Outpatient Medications  Medication Sig Dispense Refill   allopurinol (ZYLOPRIM) 100 MG tablet Take 2  tablets (200 mg total) by mouth daily. (Patient taking differently: Take 100 mg by mouth daily.) 60 tablet 2   aspirin EC 81 MG tablet Take 81 mg by mouth daily. Swallow whole.     atorvastatin (LIPITOR) 80 MG tablet Take 1 tablet by mouth once daily 90 tablet 3   clopidogrel (PLAVIX) 75 MG tablet Take 1 tablet by mouth once daily 90 tablet 0   colchicine 0.6 MG tablet TAKE 1 TABLET BY MOUTH THREE TIMES DAILY UNTIL GI UPSET OR FLARE SUBSIDES, THEN FOR MAINTENANCE TAKE 1 TABLET ONCE DAILY 90 tablet 0   esomeprazole (NEXIUM) 40 MG capsule Take 40 mg by mouth daily in the afternoon.     lidocaine (LIDODERM) 5 % Place 1 patch onto the skin daily. Remove & Discard patch within 12 hours or as directed by MD     Multiple Vitamins-Minerals (MULTIVITAMIN WITH MINERALS) tablet Take 1 tablet by  mouth daily.     nitroGLYCERIN (NITROSTAT) 0.4 MG SL tablet Place 1 tablet (0.4 mg total) under the tongue every 5 (five) minutes x 3 doses as needed for chest pain. 25 tablet 1   traMADol (ULTRAM) 50 MG tablet Take 50 mg by mouth daily.      amLODipine (NORVASC) 2.5 MG tablet Take 1 tablet (2.5 mg total) by mouth daily. 90 tablet 3   carvedilol (COREG) 12.5 MG tablet Take 1 tablet (12.5 mg total) by mouth daily. 60 tablet 6   gabapentin (NEURONTIN) 100 MG capsule Take 100 mg by mouth 3 (three) times daily as needed. (Patient not taking: Reported on 08/17/2021)     isosorbide mononitrate (IMDUR) 30 MG 24 hr tablet Take 1 tablet (30 mg total) by mouth daily. 90 tablet 3   Current Facility-Administered Medications  Medication Dose Route Frequency Provider Last Rate Last Admin   dexamethasone (DECADRON) injection 4 mg  4 mg Other Once Criselda Peaches, DPM       triamcinolone acetonide (KENALOG) 10 MG/ML injection 10 mg  10 mg Other Once Criselda Peaches, DPM         Review of Systems    Overall feeling well.  She is not having chest pain or dyspnea.  She did have some palpitations but these resolved within a day and  she was not interested in pursuing monitoring.  She has some degree of chronic back pain but this is reasonably well controlled.  She denies PND, orthopnea, dizziness, syncope, edema, or early satiety..  All other systems reviewed and are otherwise negative except as noted above.  Physical Exam    VS:  BP 140/86 (BP Location: Left Arm, Patient Position: Sitting, Cuff Size: Large)   Pulse (!) 55   Ht 5\' 10"  (1.778 m)   Wt 267 lb (121.1 kg)   SpO2 96%   BMI 38.31 kg/m  , BMI Body mass index is 38.31 kg/m.     GEN: Well nourished, well developed, in no acute distress. HEENT: normal. Neck: Supple, no JVD, carotid bruits, or masses. Cardiac: RRR, no murmurs, rubs, or gallops. No clubbing, cyanosis, edema.  Radials/PT 2+ and equal bilaterally.  Respiratory:  Respirations regular and unlabored, clear to auscultation bilaterally. GI: Obese, soft, nontender, nondistended, BS + x 4. MS: no deformity or atrophy. Skin: warm and dry, no rash. Neuro:  Strength and sensation are intact. Psych: Normal affect.  Accessory Clinical Findings    ECG personally reviewed by me today -sinus bradycardia, 55, incomplete right bundle branch block- no acute changes.  Lab Results  Component Value Date   WBC 7.5 05/06/2021   HGB 15.1 (H) 05/06/2021   HCT 45.3 05/06/2021   MCV 89.0 05/06/2021   PLT 250 05/06/2021   Lab Results  Component Value Date   CREATININE 0.96 05/06/2021   BUN 16 05/06/2021   NA 141 05/06/2021   K 4.4 05/06/2021   CL 109 05/06/2021   CO2 26 05/06/2021   Lab Results  Component Value Date   ALT 46 (H) 04/25/2021   AST 30 04/25/2021   ALKPHOS 148 (H) 06/30/2020   BILITOT 0.6 04/25/2021   Lab Results  Component Value Date   CHOL 172 06/25/2019   HDL 45 06/25/2019   LDLCALC 100 (H) 06/25/2019   TRIG 135 06/25/2019   CHOLHDL 3.8 06/25/2019    Lab Results  Component Value Date   HGBA1C 5.8 (H) 06/27/2017    Assessment & Plan    1.  Coronary artery disease: Status  post non-STEMI 2018 with drug-eluting stent placement to the first obtuse marginal and finding of chronic total occlusion of the right coronary artery.  She is doing well from a cardiac standpoint without chest pain or dyspnea.  She continues to work full-time.  She remains on statin, beta-blocker, aspirin, nitrate, and Plavix therapy.  She has noted some bruising on her forearms.  We discussed that given duration of time that is passed since her non-STEMI and stenting, that it is reasonable to discontinue Plavix.  She prefers to stay on it for right now as she is overall tolerating it.  She has only been taking isosorbide mononitrate 30 mg once a day instead of twice a day as prescribed.  In the absence of symptoms, I advised her to continue on the current dose of once daily dosing.  2.  Essential hypertension: Blood pressure is elevated today at 140/86.  In discussing her medications, she is not taking them all as prescribed.  Carvedilol was prescribed 18.75 mg twice daily but she is only taking this 12.5 mg twice daily.  Given a heart rate of 55 currently, I have advised her to stay on 12.5 mg twice daily as she is doing.  Though amlodipine was prescribed at her last visit, she never picked it up.  We will send this in again.  As previously noted, avoiding ARB in the setting of mildly elevated potassium in the past.  3.  Hyperlipidemia: LDL was 100 in July 2020.  She remains on atorvastatin therapy.  She did not tolerate Zetia secondary to diarrhea.  I offered to check lipids today however she just ate lunch about an hour ago.  She prefers not to drive back to [facility] for fasting labs on another day.  She believes that she is due for follow-up lipids in January with her primary care provider.  Encouraged lifestyle modifications.  Might need to consider PCSK9 inhibitor if LDL greater than 70 at follow-up next year.  4.  Palpitations: Patient called [place] in late August with complaints of palpitations.  We  sent her a Zio monitor but symptoms subsequently resolved and she never placed the monitor.  She will now back to the company.  5.  Back pain/spinal stenosis with degenerative disc disease: Seems to be stable.  She was supposed participate in PT but this fell off secondary to her busy work schedule.  6.  Disposition: Follow-up in clinic in 6 months or sooner if necessary.   Murray Hodgkins, NP 08/17/2021, 3:17 PM

## 2021-08-17 NOTE — Patient Instructions (Addendum)
Medication Instructions:  Your physician has recommended you make the following change in your medication:   TAKE Amlodipine 2.5 mg once daily TAKE Carvedilol 12.5 mg twice a day TAKE Isosorbide mononitrate 30 mg once daily  *If you need a refill on your cardiac medications before your next appointment, please call your pharmacy*   Lab Work: None  If you have labs (blood work) drawn today and your tests are completely normal, you will receive your results only by: [place] (if you have MyChart) OR A paper copy in the mail If you have any lab test that is abnormal or we need to change your treatment, we will call you to review the results.   Testing/Procedures: None   Follow-Up: At [facility], you and your health needs are our priority.  As part of our continuing mission to provide you with exceptional heart care, we have created designated Provider Care Teams.  These Care Teams include your primary Cardiologist (physician) and Advanced Practice Providers (APPs -  Physician Assistants and Nurse Practitioners) who all work together to provide you with the care you need, when you need it.  We recommend signing up for the patient portal called "MyChart".  Sign up information is provided on this After Visit Summary.  MyChart is used to connect with patients for Virtual Visits (Telemedicine).  Patients are able to view lab/test results, encounter notes, upcoming appointments, etc.  Non-urgent messages can be sent to your provider as well.   To learn more about what you can do with MyChart, go to [email or web address].    Your next appointment:   6 month(s)  The format for your next appointment:   In Person  Provider:   Ida Rogue, MD

## 2021-08-27 ENCOUNTER — Other Ambulatory Visit: Payer: Self-pay

## 2021-08-27 ENCOUNTER — Emergency Department
Admission: EM | Admit: 2021-08-27 | Discharge: 2021-08-27 | Disposition: A | Discharge status: Home / Self Care | Payer: Medicare Other | Attending: Emergency Medicine | Admitting: Emergency Medicine

## 2021-08-27 DIAGNOSIS — I10 Essential (primary) hypertension: Secondary | ICD-10-CM | POA: Insufficient documentation

## 2021-08-27 DIAGNOSIS — I509 Heart failure, unspecified: Secondary | ICD-10-CM | POA: Insufficient documentation

## 2021-08-27 DIAGNOSIS — Z91040 Latex allergy status: Secondary | ICD-10-CM | POA: Insufficient documentation

## 2021-08-27 DIAGNOSIS — Z8616 Personal history of COVID-19: Secondary | ICD-10-CM | POA: Diagnosis not present

## 2021-08-27 DIAGNOSIS — Z79899 Other long term (current) drug therapy: Secondary | ICD-10-CM | POA: Diagnosis not present

## 2021-08-27 DIAGNOSIS — R531 Weakness: Secondary | ICD-10-CM | POA: Diagnosis present

## 2021-08-27 DIAGNOSIS — I110 Hypertensive heart disease with heart failure: Secondary | ICD-10-CM | POA: Insufficient documentation

## 2021-08-27 DIAGNOSIS — Z85038 Personal history of other malignant neoplasm of large intestine: Secondary | ICD-10-CM | POA: Insufficient documentation

## 2021-08-27 DIAGNOSIS — I2510 Atherosclerotic heart disease of native coronary artery without angina pectoris: Secondary | ICD-10-CM | POA: Insufficient documentation

## 2021-08-27 DIAGNOSIS — R5383 Other fatigue: Secondary | ICD-10-CM | POA: Insufficient documentation

## 2021-08-27 LAB — CBC
HCT: 43.6 % (ref 36.0–46.0)
Hemoglobin: 15.2 g/dL — ABNORMAL HIGH (ref 12.0–15.0)
MCH: 31.3 pg (ref 26.0–34.0)
MCHC: 34.9 g/dL (ref 30.0–36.0)
MCV: 89.9 fL (ref 80.0–100.0)
Platelets: 242 10*3/uL (ref 150–400)
RBC: 4.85 MIL/uL (ref 3.87–5.11)
RDW: 12.7 % (ref 11.5–15.5)
WBC: 5.8 10*3/uL (ref 4.0–10.5)
nRBC: 0 % (ref 0.0–0.2)

## 2021-08-27 LAB — BASIC METABOLIC PANEL
Anion gap: 7 (ref 5–15)
BUN: 17 mg/dL (ref 8–23)
CO2: 27 mmol/L (ref 22–32)
Calcium: 9.6 mg/dL (ref 8.9–10.3)
Chloride: 105 mmol/L (ref 98–111)
Creatinine, Ser: 0.99 mg/dL (ref 0.44–1.00)
GFR, Estimated: 59 mL/min — ABNORMAL LOW (ref >60)
Glucose, Bld: 125 mg/dL — ABNORMAL HIGH (ref 70–99)
Potassium: 3.9 mmol/L (ref 3.5–5.1)
Sodium: 139 mmol/L (ref 135–145)

## 2021-08-27 LAB — TROPONIN I (HIGH SENSITIVITY): Troponin I (High Sensitivity): 6 ng/L (ref <18)

## 2021-08-27 MED ORDER — CLOPIDOGREL BISULFATE 75 MG PO TABS: 75.0000 mg | ORAL_TABLET | Freq: Every day | ORAL | Status: DC

## 2021-08-27 MED ORDER — CARVEDILOL 6.25 MG PO TABS
12.5000 mg | ORAL_TABLET | Freq: Every day | ORAL | Status: DC
  Administered 2021-08-27: 12.5 mg via ORAL
  Filled 2021-08-27: qty 2

## 2021-08-27 MED ORDER — AMLODIPINE BESYLATE 5 MG PO TABS
2.5000 mg | ORAL_TABLET | Freq: Every day | ORAL | Status: DC
  Administered 2021-08-27: 2.5 mg via ORAL
  Filled 2021-08-27: qty 1

## 2021-08-27 MED ORDER — ISOSORBIDE MONONITRATE ER 60 MG PO TB24
30.0000 mg | ORAL_TABLET | Freq: Every day | ORAL | Status: DC
  Administered 2021-08-27: 30 mg via ORAL
  Filled 2021-08-27: qty 1

## 2021-08-27 NOTE — ED Notes (Signed)
RN to bedside to introduce self to pt. Pt is CAOx4 and in no acute distress. Pt advised she checked her BP at home and she used her home BP machine which is new and it read very high so she called 911. No acute signs of distress.

## 2021-08-27 NOTE — ED Triage Notes (Addendum)
Patient reports her blood pressure has been increasing over the last few days, she has been feeling increasingly weak, and she feels generally bad. Denies headache, vision changes, or chest pain.

## 2021-08-27 NOTE — ED Notes (Signed)
This RN spent 15 minutes educating pt on BP medications. She informed me that she was scared to take all three of these BP meds at one time. And that the MD that came in the room was only here 5 minutes and said she could take these meds and then go home. So this RN calmed her nerves and explained it all for her so she new it was ok to take the three medications and then go home and continue to monitor her BP on her home machine and follow up with her PCP.

## 2021-08-27 NOTE — ED Provider Notes (Signed)
[facility] Emergency Department Provider Note  ____________________________________________   Event Date/Time   First MD Initiated Contact with Patient 08/27/21 0818     (approximate)  I have reviewed the triage vital signs and the nursing notes.   HISTORY  Chief Complaint No chief complaint on file.   HPI Carrie Larsen is a 75 y.o. female with a past medical history of CAD, kidney cancer status post nephrectomy on the left, CHF, arthritis, anxiety, HDL, HTN, chronic back pain who presents for assessment of some generalized weakness over the last 2 or 3 days after recently having blood pressure medicines adjusted.  Patient states she was started on amlodipine and her Coreg slightly reduced..  States he did not take her blood pressure medicines today and wanted to get checked out to make sure there is nothing else going on.  She denies any headache, vision changes, chest pain, cough, shortness of breath, nausea, vomiting, diarrhea, abdominal pain, back pain, rash, extremity weakness numbness or tingling, recent falls or injuries, illicit drug use or any other acute concerns.  States she is feeling "hyper" because her blood pressure is higher than normal.  No other acute concerns at this time.  States we will plan to call her cardiologist tomorrow morning to see if she did have her bladder medicines adjusted.         Past Medical History:  Diagnosis Date   Anxiety    Arthritis    osteoarthritis   CAD (coronary artery disease)    a. 2012 ETT: no ischemia;  b. 06/2017 NSTEMI/PCI: LM nl, LAD nl, D1 70-80p, LCX nl, OM1 90 (3.0 x 23 Xience Alpine), RCA dominant, 100p/m, fills via L->R collats, EF 55-65%.   Cancer [facility])    a. s/p partial left nephrectomy.   Chicken pox    CME (cystoid macular edema), left 11/20/2018   Colon cancer ([place]) 1992   T3, N1, M0. colon    Colon polyp 2011   COVID-19 97/8478   Diastolic dysfunction    a. 08/2015 Echo: EF 60-65%, no  rwma, Gr1 DD, midlly dil LA, PASP 59mmHg. No significant valvular dzs; b. 06/2017 Echo: EF 60-65%, Gr1DD, mildly dil LA.   Essential hypertension    GERD (gastroesophageal reflux disease)    Gout    History of appendectomy 06/07/2015   History of kidney cancer 11/20/2018   History of kidney stones    passed - 2   Hyperlipidemia    Lumbar spinal stenosis    Myocardial infarction ([employer])    06/2017   Nonexudative age-related macular degeneration, bilateral, early dry stage 11/20/2018   Obesity    Prediabetes    Pseudophakia of both eyes    Retinal cyst     Patient Active Problem List   Diagnosis Date Noted   Personal history of kidney stones 12/25/2019   Choroidal nevus, 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   CME (cystoid macular edema), left 11/20/2018   Nonexudative age-related macular degeneration, bilateral, early dry stage 11/20/2018   Pseudophakia of both eyes 11/20/2018   Olecranon bursitis 10/16/2018   Contusion of knee 10/16/2018   Arthritis of knee 10/16/2018   Arthritis of hand 10/16/2018   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/10/2018   Morbid obesity ([place]) 09/11/2017   Non-STEMI (non-ST elevated myocardial infarction) ([place]) 06/27/2017   Abdominal pain 02/28/2017   History of renal cell carcinoma 02/20/2017   Knee pain 08/14/2016   Strain of hamstring muscle 08/08/2016   Left leg swelling 08/22/2015  Systolic murmur    Retrosternal chest pain 07/08/2015   History of colonic polyps 04/16/2015   Chest pain 03/08/2011   Hyperlipidemia 03/08/2011   HTN (hypertension) 03/08/2011    Past Surgical History:  Procedure Laterality Date   ABDOMINAL HYSTERECTOMY     APPENDECTOMY  06/07/2015   CARDIAC CATHETERIZATION     CATARACT EXTRACTION W/PHACO Left 04/24/2016   Procedure: CATARACT EXTRACTION PHACO AND INTRAOCULAR LENS PLACEMENT ([place]);  Surgeon: Birder Robson, MD;  Location: [facility] ORS;  Service: Ophthalmology;  Laterality: Left;  [place] 45.8 AP% 16.4 CDE 7.53 FLUID PACK  LOT # [id number]   CATARACT EXTRACTION W/PHACO Right 06/05/2016   Procedure: CATARACT EXTRACTION PHACO AND INTRAOCULAR LENS PLACEMENT (IOC);  Surgeon: Birder Robson, MD;  Location: [facility] ORS;  Service: Ophthalmology;  Laterality: Right;  [place] 25.9 AP% 21.9 CDE 5.68 Fluid pack lot # [id number]   COLON RESECTION  03/04/1991   COLON SURGERY     COLONOSCOPY  03-14-10   Dr Bary Castilla, tubular adenoma at 25 cm.   COLONOSCOPY WITH PROPOFOL N/A 06/07/2015   Procedure: COLONOSCOPY WITH PROPOFOL;  Surgeon: Robert Bellow, MD;  Location: [facility] END[facility];  Service: Endoscopy;  Laterality: N/A;   COLONOSCOPY WITH PROPOFOL N/A 08/10/2020   Procedure: COLONOSCOPY WITH PROPOFOL;  Surgeon: Robert Bellow, MD;  Location: [facility] ENDOSCOPY;  Service: Endoscopy;  Laterality: N/A;   CORONARY STENT INTERVENTION N/A 06/28/2017   Procedure: CORONARY STENT INTERVENTION;  Surgeon: Yolonda Kida, MD;  Location: [place] CV LAB;  Service: Cardiovascular;  Laterality: N/A;   EYE SURGERY     lens eye surgery   HERNIA REPAIR  5329   umbilical   LEFT HEART CATH AND CORONARY ANGIOGRAPHY N/A 06/28/2017   Procedure: LEFT HEART CATH AND CORONARY ANGIOGRAPHY;  Surgeon: Minna Merritts, MD;  Location: [place] CV LAB;  Service: Cardiovascular;  Laterality: N/A;   LUMBAR LAMINECTOMY/DECOMPRESSION MICRODISCECTOMY Bilateral 03/10/2018   Procedure: Laminectomy and Foraminotomy - Lumbar three-Lumbar four - Lumbar four-Lumbar five - bilateral;  Surgeon: Earnie Larsson, MD;  Location: [place];  Service: Neurosurgery;  Laterality: Bilateral;   NEPHRECTOMY  1992   left- cancer   RIB RESECTION     removal 4 ribs   TONSILLECTOMY      Prior to Admission medications   Medication Sig Start Date End Date Taking? Authorizing Provider  allopurinol (ZYLOPRIM) 100 MG tablet Take 2 tablets (200 mg total) by mouth daily. Patient taking differently: Take 100 mg by mouth daily. 04/25/21   Ofilia Neas, PA-C  amLODipine (NORVASC) 2.5 MG  tablet Take 1 tablet (2.5 mg total) by mouth daily. 08/17/21 11/15/21  Theora Gianotti, NP  aspirin EC 81 MG tablet Take 81 mg by mouth daily. Swallow whole.    [provider]  atorvastatin (LIPITOR) 80 MG tablet Take 1 tablet by mouth once daily 07/25/21   Minna Merritts, MD  carvedilol (COREG) 12.5 MG tablet Take 1 tablet (12.5 mg total) by mouth daily. 08/17/21 11/15/21  Theora Gianotti, NP  clopidogrel (PLAVIX) 75 MG tablet Take 1 tablet by mouth once daily 03/15/21   Minna Merritts, MD  colchicine 0.6 MG tablet TAKE 1 TABLET BY MOUTH THREE TIMES DAILY UNTIL GI UPSET OR FLARE SUBSIDES, THEN FOR MAINTENANCE TAKE 1 TABLET ONCE DAILY 11/16/20   Hyatt, Max T, DPM  esomeprazole (NEXIUM) 40 MG capsule Take 40 mg by mouth daily in the afternoon.    [provider]  gabapentin (NEURONTIN) 100 MG capsule Take 100  mg by mouth 3 (three) times daily as needed. Patient not taking: Reported on 08/17/2021 03/27/21   [provider]  isosorbide mononitrate (IMDUR) 30 MG 24 hr tablet Take 1 tablet (30 mg total) by mouth daily. 08/17/21   Theora Gianotti, NP  lidocaine (LIDODERM) 5 % Place 1 patch onto the skin daily. Remove & Discard patch within 12 hours or as directed by MD    [provider]  Multiple Vitamins-Minerals (MULTIVITAMIN WITH MINERALS) tablet Take 1 tablet by mouth daily.    [provider]  nitroGLYCERIN (NITROSTAT) 0.4 MG SL tablet Place 1 tablet (0.4 mg total) under the tongue every 5 (five) minutes x 3 doses as needed for chest pain. 10/12/20   Minna Merritts, MD  traMADol (ULTRAM) 50 MG tablet Take 50 mg by mouth daily.  05/18/19   [provider]    Allergies Morphine and related, Prednisone, and Latex  Family History  Problem Relation Age of Onset   Colon cancer Mother    Cerebral aneurysm Father    Heart attack Father    Heart attack Brother 50   Healthy Son     Social History Social History    Tobacco Use   Smoking status: Never   Smokeless tobacco: Never  Vaping Use   Vaping Use: Never used  Substance Use Topics   Alcohol use: No   Drug use: No    Review of Systems  Review of Systems  Constitutional:  Positive for malaise/fatigue. Negative for chills and fever.  HENT:  Negative for sore throat.   Eyes:  Negative for pain.  Respiratory:  Negative for cough and stridor.   Cardiovascular:  Negative for chest pain.  Gastrointestinal:  Negative for vomiting.  Genitourinary:  Negative for dysuria.  Musculoskeletal:  Negative for myalgias.  Skin:  Negative for rash.  Neurological:  Positive for weakness. Negative for seizures, loss of consciousness and headaches.  Psychiatric/Behavioral:  Negative for suicidal ideas.   All other systems reviewed and are negative.    ____________________________________________   PHYSICAL EXAM:  VITAL SIGNS: ED Triage Vitals  Enc Vitals Group     BP 08/27/21 0502 (!) 166/81     Pulse Rate 08/27/21 0502 66     Resp 08/27/21 0502 20     Temp 08/27/21 0502 98.5 F (36.9 C)     Temp Source 08/27/21 0502 Oral     SpO2 08/27/21 0502 94 %     Weight 08/27/21 0501 266 lb (120.7 kg)     Height 08/27/21 0501 5\' 10"  (1.778 m)     Head Circumference --      Peak Flow --      Pain Score 08/27/21 0501 0     Pain Loc --      Pain Edu? --      Excl. in [place]? --    Vitals:   08/27/21 0840 08/27/21 0918  BP: (!) 171/84 (!) 151/82  Pulse: 60   Resp: 16 16  Temp:  98.5 F (36.9 C)  SpO2: 94%    Physical Exam Vitals and nursing note reviewed.  Constitutional:      General: She is not in acute distress.    Appearance: She is well-developed. She is obese.  HENT:     Head: Normocephalic and atraumatic.     Right Ear: External ear normal.     Left Ear: External ear normal.     Nose: Nose normal.  Eyes:  Conjunctiva/sclera: Conjunctivae normal.  Cardiovascular:     Rate and Rhythm: Normal rate and regular rhythm.     Heart  sounds: No murmur heard. Pulmonary:     Effort: Pulmonary effort is normal. No respiratory distress.     Breath sounds: Normal breath sounds.  Abdominal:     Palpations: Abdomen is soft.     Tenderness: There is no abdominal tenderness.  Musculoskeletal:     Cervical back: Neck supple.  Skin:    General: Skin is warm and dry.  Neurological:     Mental Status: She is alert and oriented to person, place, and time.  Psychiatric:        Mood and Affect: Mood normal.    Cranial nerves II through XII grossly intact.  No pronator drift.  No finger dysmetria.  Symmetric 5/5 strength of all extremities.  Sensation intact to light touch in all extremities.  Unremarkable unassisted gait.  ____________________________________________   LABS (all labs ordered are listed, but only abnormal results are displayed)  Labs Reviewed  CBC - Abnormal; Notable for the following components:      Result Value   Hemoglobin 15.2 (*)    All other components within normal limits  BASIC METABOLIC PANEL - Abnormal; Notable for the following components:   Glucose, Bld 125 (*)    GFR, Estimated 59 (*)    All other components within normal limits  TROPONIN I (HIGH SENSITIVITY)  TROPONIN I (HIGH SENSITIVITY)   ____________________________________________  EKG  Sinus rhythm with ventricular rate of 66, incomplete right bundle branch block and some nonspecific ST changes in lead III without other clear evidence of acute ischemia or significant arrhythmia.  QTc intervals unremarkable. ____________________________________________  RADIOLOGY  ED MD interpretation:   Official radiology report(s): No results found.  ____________________________________________   PROCEDURES  Procedure(s) performed (including Critical Care):  Procedures   ____________________________________________   INITIAL IMPRESSION / ASSESSMENT AND PLAN / ED COURSE        Patient presents with above-stated history exam for  assessment of some fatigue that she states all started when she was started amlodipine and slightly decreased her Coreg.  On arrival she is hypertensive with a BP of 166/81 with otherwise stable vital signs on room air.  She denies any other associated symptoms including any pain, shortness of breath, dysuria, or focal weakness.  Denies any other new medication changes.  Senna possible she is experiencing some weakness related to elevated blood pressures systolic is less than 161 and she has no associated pain or focal neurological deficits to suggest SAH, CVA, press, dissection, ACS or acute infectious process.  ECG and nonelevated troponins with no evidence of arrhythmia or any significant demand ischemia.  BMP shows kidney function at baseline without any other significant electrolyte or metabolic derangements.  BC shows no leukocytosis or acute anemia.  Given patient has not had her blood pressure medicines today she was given her medications.  Given otherwise stable vitals with reassuring work-up with patient denying any other acute sick symptoms at this time I think she stable for discharge with close outpatient cardiology follow-up tomorrow but they can titrate her medications if necessary at that time.  Patient discharged in stable condition.  Strict return precautions provided and discussed including development of any headache, chest pain, shortness of breath, dizziness, nausea, burning with urination, back pain, fevers or any other acute sick symptoms.     ____________________________________________   FINAL CLINICAL IMPRESSION(S) / ED DIAGNOSES  Final diagnoses:  Hypertension, unspecified type  Other fatigue    Medications - No data to display   ED Discharge Orders     None        Note:  This document was prepared using Dragon voice recognition software and may include unintentional dictation errors.    Lucrezia Starch, MD 08/27/21 [phone]

## 2021-08-31 ENCOUNTER — Telehealth: Payer: Self-pay | Admitting: Cardiovascular Disease

## 2021-08-31 DIAGNOSIS — I10 Essential (primary) hypertension: Secondary | ICD-10-CM

## 2021-08-31 MED ORDER — CARVEDILOL 12.5 MG PO TABS: 12.5000 mg | ORAL_TABLET | Freq: Two times a day (BID) | ORAL | 3 refills | Status: DC

## 2021-08-31 MED ORDER — ISOSORBIDE MONONITRATE ER 30 MG PO TB24: 30.0000 mg | ORAL_TABLET | Freq: Two times a day (BID) | ORAL | 3 refills | Status: DC

## 2021-08-31 NOTE — Telephone Encounter (Signed)
Called the pt to discuss her BP and recent ED visit.  She cannot tolerate amlodipine 2.5mg  daily.  It made her feel as if her heart was beating out of her chest both times she took it.   She is concerned that her medications prescribed do not match. Coreg is listed as once per day on the bottle, and she noticed her AVS mentioned it should be taken BID.  Recommendations: 1) Discontinue amlodipine. Will add to intolerance list. 2) Increase to Imdur 30mg  BID (total of Imdur 60mg  per day) 3) Take Coreg 12.5mg  BID as indicated in AVS (we will update the script in our EMR).    She expressed her appreciation for the call.

## 2021-08-31 NOTE — Telephone Encounter (Signed)
Patient states she is still having fluttering and her BP is still elevated. She states that she called EMS on this past Saturday .  States she does not know what her BP was. She thinks it was 211/"over something really high". Please call to discuss.

## 2021-09-14 ENCOUNTER — Telehealth: Payer: Self-pay | Admitting: Nurse Practitioner

## 2021-09-14 NOTE — Telephone Encounter (Signed)
Patient is confused and emotional about her medications. She is not sure how to take her Isosorbide and her Carvedilol, also her Plavix.

## 2021-09-14 NOTE — Telephone Encounter (Signed)
Spoke with patient and reviewed all of her cardiac medications, doses, and when to take. We went through each one and she matched up to her bottles. She requested that I please mail her list so that she can review and place on her refrigerator. Patient was appreciative for the time, review, and information provided.  She verbalized understanding of our conversation with no further questions at this time.

## 2021-09-26 ENCOUNTER — Other Ambulatory Visit: Payer: Self-pay | Admitting: Podiatry

## 2021-09-26 NOTE — Telephone Encounter (Signed)
Please advise 

## 2021-09-27 ENCOUNTER — Telehealth: Payer: Self-pay | Admitting: *Deleted

## 2021-09-27 NOTE — Telephone Encounter (Signed)
Pharmacy called regarding refill for colchicine-  Patient is on carvedilol and its throwing a red flag for needing a dosage adjustment... please advise

## 2021-10-05 DIAGNOSIS — M1712 Unilateral primary osteoarthritis, left knee: Secondary | ICD-10-CM | POA: Insufficient documentation

## 2021-10-05 DIAGNOSIS — M25562 Pain in left knee: Secondary | ICD-10-CM | POA: Insufficient documentation

## 2021-11-28 ENCOUNTER — Ambulatory Visit (INDEPENDENT_AMBULATORY_CARE_PROVIDER_SITE_OTHER): Payer: Medicare Other | Admitting: Podiatry

## 2021-11-28 ENCOUNTER — Encounter: Payer: Self-pay | Admitting: Podiatry

## 2021-11-28 ENCOUNTER — Ambulatory Visit (INDEPENDENT_AMBULATORY_CARE_PROVIDER_SITE_OTHER): Payer: Medicare Other

## 2021-11-28 ENCOUNTER — Other Ambulatory Visit: Payer: Self-pay

## 2021-11-28 DIAGNOSIS — M7751 Other enthesopathy of right foot: Secondary | ICD-10-CM

## 2021-11-28 DIAGNOSIS — M109 Gout, unspecified: Secondary | ICD-10-CM | POA: Diagnosis not present

## 2021-11-28 DIAGNOSIS — M7752 Other enthesopathy of left foot: Secondary | ICD-10-CM

## 2021-12-06 ENCOUNTER — Ambulatory Visit: Payer: Self-pay

## 2021-12-09 MED ORDER — BETAMETHASONE SOD PHOS & ACET 6 (3-3) MG/ML IJ SUSP: 3.0000 mg | Freq: Once | INTRAMUSCULAR | Status: DC

## 2021-12-09 NOTE — Progress Notes (Signed)
HPI: 76 y.o. female presenting today for an acute gout flareup.  Patient states that she is having gout to the bilateral great toe joints.  The left is worse than the right.  This began a few days ago.  Is very tender to palpation and red and swollen.  She presents for further treatment and evaluation.  Past Medical History:  Diagnosis Date   Anxiety    Arthritis    osteoarthritis   CAD (coronary artery disease)    a. 2012 ETT: no ischemia;  b. 06/2017 NSTEMI/PCI: LM nl, LAD nl, D1 70-80p, LCX nl, OM1 90 (3.0 x 23 Xience [place]pine), RCA dominant, 100p/m, fills via L->R collats, EF 55-65%.   Cancer [facility])    a. s/p partial left nephrectomy.   Chicken pox    CME (cystoid macular edema), left 11/20/2018   Colon cancer ([place]) 1992   T3, N1, M0. colon    Colon polyp 2011   COVID-19 24/4010   Diastolic dysfunction    a. 08/2015 Echo: EF 60-65%, no rwma, Gr1 DD, midlly dil LA, PASP 54mmHg. No significant valvular dzs; b. 06/2017 Echo: EF 60-65%, Gr1DD, mildly dil LA.   Essential hypertension    GERD (gastroesophageal reflux disease)    Gout    History of appendectomy 06/07/2015   History of kidney cancer 11/20/2018   History of kidney stones    passed - 2   Hyperlipidemia    Lumbar spinal stenosis    Myocardial infarction ([place])    06/2017   Nonexudative age-related macular degeneration, bilateral, early dry stage 11/20/2018   Obesity    Prediabetes    Pseudophakia of both eyes    Retinal cyst     Past Surgical History:  Procedure Laterality Date   ABDOMINAL HYSTERECTOMY     APPENDECTOMY  06/07/2015   CARDIAC CATHETERIZATION     CATARACT EXTRACTION W/PHACO Left 04/24/2016   Procedure: CATARACT EXTRACTION PHACO AND INTRAOCULAR LENS PLACEMENT ([place]);  Surgeon: Birder Robson, MD;  Location: [facility] ORS;  Service: Ophthalmology;  Laterality: Left;  [place] 45.8 AP% 16.4 CDE 7.53 FLUID PACK LOT # [id number]   CATARACT EXTRACTION W/PHACO Right 06/05/2016   Procedure: CATARACT EXTRACTION PHACO AND  INTRAOCULAR LENS PLACEMENT ([facility]);  Surgeon: Birder Robson, MD;  Location: [facility] OR[facility];  Service: Ophthalmology;  Laterality: Right;  [place] 25.9 AP% 21.9 CDE 5.68 Fluid pack lot # [id number]   COLON RESECTION  03/04/1991   COLON SURGERY     COLONOSCOPY  03-14-10   Dr Bary Castilla, tubular adenoma at 25 cm.   COLONOSCOPY WITH PROPOFOL N/A 06/07/2015   Procedure: COLONOSCOPY WITH PROPOFOL;  Surgeon: Robert Bellow, MD;  Location: [facility] ENDOSCOPY;  Service: Endoscopy;  Laterality: N/A;   COLONOSCOPY WITH PROPOFOL N/A 08/10/2020   Procedure: COLONOSCOPY WITH PROPOFOL;  Surgeon: Robert Bellow, MD;  Location: [facility] ENDOSCOPY;  Service: Endoscopy;  Laterality: N/A;   CORONARY STENT INTERVENTION N/A 06/28/2017   Procedure: CORONARY STENT INTERVENTION;  Surgeon: Yolonda Kida, MD;  Location: [place] CV LAB;  Service: Cardiovascular;  Laterality: N/A;   EYE SURGERY     lens eye surgery   HERNIA REPAIR  2595   umbilical   LEFT HEART CATH AND CORONARY ANGIOGRAPHY N/A 06/28/2017   Procedure: LEFT HEART CATH AND CORONARY ANGIOGRAPHY;  Surgeon: Minna Merritts, MD;  Location: [place] CV LAB;  Service: Cardiovascular;  Laterality: N/A;   LUMBAR LAMINECTOMY/DECOMPRESSION MICRODISCECTOMY Bilateral 03/10/2018   Procedure: Laminectomy and Foraminotomy - Lumbar three-Lumbar four - Lumbar  four-Lumbar five - bilateral;  Surgeon: Earnie Larsson, MD;  Location: [place];  Service: Neurosurgery;  Laterality: Bilateral;   NEPHRECTOMY  1992   left- cancer   RIB RESECTION     removal 4 ribs   TONSILLECTOMY      Allergies  Allergen Reactions   Morphine And Related Nausea Only   Prednisone Nausea And Vomiting   Amlodipine Palpitations   Latex Rash     Physical Exam: General: The patient is alert and oriented x3 in no acute distress.  Dermatology: Skin is warm, dry and supple bilateral lower extremities. Negative for open lesions or macerations.  Vascular: Palpable pedal pulses bilaterally. Capillary  refill within normal limits.  Erythema with edema noted to the first MTP joint bilateral  Neurological: Light touch and protective threshold grossly intact  Musculoskeletal Exam: No pedal deformities noted.  Significant tenderness to light touch and palpation of the first MTP joint bilateral  Radiographic Exam:  Normal osseous mineralization. Joint spaces preserved. No fracture/dislocation/boney destruction.    Assessment: 1.  Acute inflammatory gout/capsulitis first MTP bilateral   Plan of Care:  1. Patient evaluated. X-Rays reviewed.  2.  Injection of 0.5 cc Celestone Soluspan injection into the bilateral first MTP joint 3.  Continue colchicine and allopurinol as per PCP 4.  Return to clinic as needed      Edrick Kins, DPM [facility]  Dr. Edrick Kins, DPM    [address], [place] [address]                Office [phone]  Fax [phone]

## 2021-12-24 NOTE — Progress Notes (Signed)
02/06/2021 3:51 PM   [facility] 10-13-1946 [id number]  Referring provider: Rutherford L[place], [place],  [place] [address] Chief Complaint  Patient presents with   Follow-up    Bladder leakage   Chief Complaint  Patient presents with   Follow-up    Bladder leakage      Urological history: 1. Renal cell carcinoma - s/p left nephrectomy ~27 years ago for RCC   2. Nephrolithiasis - no documented stones > 10 years  HPI: Carrie Larsen is a 76 y.o. female who presents today for severe urinary leakage.  She has been having 8 or more daytime urinations, 8 or more nighttime urinations, 8 or more episodes of urinary leakage and 4-7 episodes of nocturia.  PVR 0 mL  She states that she has nocturia x 3-4 associated and soaking her extra absorbant pads.  She has no issues during the day.  She has been restricting fluids in efforts to decrease night time urination.  She denies any pedal edema.    She avoids caffeine and soda.  No alcohol.  No juices or energy drinks.    She states her symptoms bother her more often at night.    Patient denies any modifying or aggravating factors.  Patient denies any gross hematuria, dysuria or suprapubic/flank pain.  Patient denies any fevers, chills, nausea or vomiting.     PMH: Past Medical History:  Diagnosis Date   Anxiety    Arthritis    osteoarthritis   CAD (coronary artery disease)    a. 2012 ETT: no ischemia;  b. 06/2017 NSTEMI/PCI: LM nl, LAD nl, D1 70-80p, LCX nl, OM1 90 (3.0 x 23 Xience Alpine), RCA dominant, 100p/m, fills via L->R collats, EF 55-65%.   Cancer [facility])    a. s/p partial left nephrectomy.   Chicken pox    CME (cystoid macular edema), left 11/20/2018   Colon cancer ([place]) 1992   T3, N1, M0. colon    Colon polyp 2011   COVID-19 61/4431   Diastolic dysfunction    a. 08/2015 Echo: EF 60-65%, no rwma, Gr1 DD, midlly dil LA, PASP 43mmHg. No significant valvular dzs; b. 06/2017 Echo: EF 60-65%, Gr1DD,  mildly dil LA.   Essential hypertension    GERD (gastroesophageal reflux disease)    Gout    History of appendectomy 06/07/2015   History of kidney cancer 11/20/2018   History of kidney stones    passed - 2   Hyperlipidemia    Lumbar spinal stenosis    Myocardial infarction ([place])    06/2017   Nonexudative age-related macular degeneration, bilateral, early dry stage 11/20/2018   Obesity    Prediabetes    Pseudophakia of both eyes    Retinal cyst     Surgical History: Past Surgical History:  Procedure Laterality Date   ABDOMINAL HYSTERECTOMY     APPENDECTOMY  06/07/2015   CARDIAC CATHETERIZATION     CATARACT EXTRACTION W/PHACO Left 04/24/2016   Procedure: CATARACT EXTRACTION PHACO AND INTRAOCULAR LENS PLACEMENT ([place]);  Surgeon: Birder Robson, MD;  Location: [facility] ORS;  Service: Ophthalmology;  Laterality: Left;  [place] 45.8 AP% 16.4 CDE 7.53 FLUID PACK LOT # [id number]   CATARACT EXTRACTION W/PHACO Right 06/05/2016   Procedure: CATARACT EXTRACTION PHACO AND INTRAOCULAR LENS PLACEMENT (IOC);  Surgeon: Birder Robson, MD;  Location: [facility] ORS;  Service: Ophthalmology;  Laterality: Right;  [place] 25.9 AP% 21.9 CDE 5.68 Fluid pack lot # [id number]   COLON RESECTION  03/04/1991  COLON SURGERY     COLONOSCOPY  03-14-10   Dr Bary Castilla, tubular adenoma at 25 cm.   COLONOSCOPY WITH PROPOFOL N/A 06/07/2015   Procedure: COLONOSCOPY WITH PROPOFOL;  Surgeon: Robert Bellow, MD;  Location: [facility] ENDOSCOPY;  Service: Endoscopy;  Laterality: N/A;   COLONOSCOPY WITH PROPOFOL N/A 08/10/2020   Procedure: COLONOSCOPY WITH PROPOFOL;  Surgeon: Robert Bellow, MD;  Location: [facility] ENDOSCOPY;  Service: Endoscopy;  Laterality: N/A;   CORONARY STENT INTERVENTION N/A 06/28/2017   Procedure: CORONARY STENT INTERVENTION;  Surgeon: Yolonda Kida, MD;  Location: [place] CV LAB;  Service: Cardiovascular;  Laterality: N/A;   EYE SURGERY     lens eye surgery   HERNIA REPAIR  1751   umbilical   LEFT HEART  CATH AND CORONARY ANGIOGRAPHY N/A 06/28/2017   Procedure: LEFT HEART CATH AND CORONARY ANGIOGRAPHY;  Surgeon: Minna Merritts, MD;  Location: [place] CV LAB;  Service: Cardiovascular;  Laterality: N/A;   LUMBAR LAMINECTOMY/DECOMPRESSION MICRODISCECTOMY Bilateral 03/10/2018   Procedure: Laminectomy and Foraminotomy - Lumbar three-Lumbar four - Lumbar four-Lumbar five - bilateral;  Surgeon: Earnie Larsson, MD;  Location: [place];  Service: Neurosurgery;  Laterality: Bilateral;   NEPHRECTOMY  1992   left- cancer   RIB RESECTION     removal 4 ribs   TONSILLECTOMY      Home Medications:  Allergies as of 12/25/2021       Reactions   Morphine And Related Nausea Only   Prednisone Nausea And Vomiting   Amlodipine Palpitations   Latex Rash        Medication List        Accurate as of December 25, 2021  3:51 PM. If you have any questions, ask your nurse or doctor.          allopurinol 100 MG tablet Commonly known as: ZYLOPRIM Take 2 tablets (200 mg total) by mouth daily.   aspirin EC 81 MG tablet Take 81 mg by mouth daily. Swallow whole.   atorvastatin 80 MG tablet Commonly known as: LIPITOR Take 1 tablet by mouth once daily   carvedilol 12.5 MG tablet Commonly known as: COREG Take 1 tablet (12.5 mg total) by mouth 2 (two) times daily.   clopidogrel 75 MG tablet Commonly known as: PLAVIX Take 1 tablet by mouth once daily   colchicine 0.6 MG tablet TAKE 1 TABLET BY MOUTH THREE TIMES DAILY UNTIL STOMACH UPSET OR FLARE SUBSIDES. THEN FOR MAINTENANCE TAKE 1 TABLET ONCE DAILY   esomeprazole 40 MG capsule Commonly known as: NEXIUM Take 40 mg by mouth daily in the afternoon.   gabapentin 100 MG capsule Commonly known as: NEURONTIN Take 100 mg by mouth 3 (three) times daily as needed.   isosorbide mononitrate 30 MG 24 hr tablet Commonly known as: IMDUR Take 1 tablet (30 mg total) by mouth in the morning and at bedtime.   lidocaine 5 % Commonly known as: LIDODERM Place  1 patch onto the skin daily. Remove & Discard patch within 12 hours or as directed by MD   multivitamin with minerals tablet Take 1 tablet by mouth daily.   nitroGLYCERIN 0.4 MG SL tablet Commonly known as: NITROSTAT Place 1 tablet (0.4 mg total) under the tongue every 5 (five) minutes x 3 doses as needed for chest pain.   traMADol 50 MG tablet Commonly known as: ULTRAM Take 50 mg by mouth daily.        Allergies:  Allergies  Allergen Reactions   Morphine And Related  Nausea Only   Prednisone Nausea And Vomiting   Amlodipine Palpitations   Latex Rash    Family History: Family History  Problem Relation Age of Onset   Colon cancer Mother    Cerebral aneurysm Father    Heart attack Father    Heart attack Brother 55   Healthy Son     Social History:  reports that she has never smoked. She has never used smokeless tobacco. She reports that she does not drink alcohol and does not use drugs.   Physical Exam: BP (!) 188/97    Pulse 80    Ht 5\' 10"  (1.778 m)    Wt 268 lb (121.6 kg)    BMI 38.45 kg/m   Constitutional:  Well nourished. Alert and oriented, No acute distress. HEENT: [place] AT, mask in place.  Trachea midline Cardiovascular: No clubbing, cyanosis, or edema. Respiratory: Normal respiratory effort, no increased work of breathing. Neurologic: Grossly intact, no focal deficits, moving all 4 extremities. Psychiatric: Normal mood and affect.    Laboratory Data: BMP Latest Ref Rng & Units 08/27/2021 05/06/2021 04/25/2021  Glucose 70 - 99 mg/dL 125(H) 123(H) 87  BUN 8 - 23 mg/dL 17 16 12   Creatinine 0.44 - 1.00 mg/dL 0.99 0.96 1.01(H)  BUN/Creat Ratio 6 - 22 (calc) - - 12  Sodium 135 - 145 mmol/L 139 141 146  Potassium 3.5 - 5.1 mmol/L 3.9 4.4 5.2  Chloride 98 - 111 mmol/L 105 109 108  CO2 22 - 32 mmol/L 27 26 28   Calcium 8.9 - 10.3 mg/dL 9.6 9.9 10.5(H)   CBC Latest Ref Rng & Units 08/27/2021 05/06/2021 04/25/2021  WBC 4.0 - 10.5 K/uL 5.8 7.5 6.5  Hemoglobin 12.0 -  15.0 g/dL 15.2(H) 15.1(H) 14.8  Hematocrit 36.0 - 46.0 % 43.6 45.3 45.2(H)  Platelets 150 - 400 K/uL 242 250 275   Urinalysis Component     Latest Ref Rng & Units 12/25/2021  Color, Urine     YELLOW YELLOW  Appearance     CLEAR HAZY (A)  Specific Gravity, Urine     1.005 - 1.030 >1.030 (H)  pH     5.0 - 8.0 5.0  Glucose, UA     NEGATIVE mg/dL NEGATIVE  Hgb urine dipstick     NEGATIVE NEGATIVE  Bilirubin Urine     NEGATIVE NEGATIVE  Ketones, ur     NEGATIVE mg/dL TRACE (A)  Protein     NEGATIVE mg/dL NEGATIVE  Nitrite     NEGATIVE NEGATIVE  Leukocytes,Ua     NEGATIVE MODERATE (A)  RBC / HPF     0 - 5 RBC/hpf 0-5  WBC, UA     0 - 5 WBC/hpf 21-50  Bacteria, UA     NONE SEEN FEW (A)  Squamous Epithelial / LPF     0 - 5 6-10  I have reviewed the labs.    Pertinent Imaging:  12/25/21 15:26  Scan Result 21ml     Assessment & Plan:    1. Nocturia -Risk factors for nocturia: possible obstructive sleep apnea, hypertension and heart disease - I explained to the patient that nocturia is often multi-factorial and difficult to treat.  Sleeping disorders, heart conditions, peripheral vascular disease, diabetes, an enlarged prostate for men, an urethral stricture causing bladder outlet obstruction and/or certain medications can contribute to nocturia. - I have suggested that the patient avoid caffeine after noon and alcohol in the evening.  He or she may also benefit from fluid restrictions after  6:00 in the evening and voiding just prior to bedtime. - I have explained that research studies have showed that over 84% of patients with sleep apnea reported frequent nighttime urination.   With sleep apnea, oxygen decreases, carbon dioxide increases, the blood become more acidic, the heart rate drops and blood vessels in the lung constrict.  The body is then alerted that something is very wrong. The sleeper must wake enough to reopen the airway. By this time, the heart is racing and  experiences a false signal of fluid overload. The heart excretes a hormone-like protein that tells the body to get rid of sodium and water, resulting in nocturia. -  I also informed the patient that a recent study noted that decreasing sodium intake to 2.3 grams daily, if they don't have issues with hyponatremia, can also reduce the number of nightly voids - There is also an increased incidence in sleep apnea with menopause, symptoms include night sweats, daytime sleepiness, depressed mood, and cognitive complaints like poor concentration or problems with short-term memory  - The patient may benefit from a discussion with his or her primary care physician to see if he or she has risk factors for sleep apnea or other sleep disturbances and obtaining a sleep study.  -We also discussed trying another medication as Logan Bores was too expensive, but after discussing the side effects of anticholinergics she declined -We also discussed PTNS therapy, but due to her schedule she would not be able to make 12 weekly treatments -UA with pyuria -urine sent for culture to rule out UTI as a cause for nocturia    2. Solitary Kidney -will schedule RUS  3. Nephrolithiasis  -remains asymptomatic  Return for Keep follow in March as scheduled .  Zara Council, PA-C   [facility] [address], [place], [place] [address] [phone]

## 2021-12-25 ENCOUNTER — Other Ambulatory Visit: Payer: Self-pay | Admitting: *Deleted

## 2021-12-25 ENCOUNTER — Ambulatory Visit (INDEPENDENT_AMBULATORY_CARE_PROVIDER_SITE_OTHER): Payer: Medicare Other | Admitting: Urology

## 2021-12-25 ENCOUNTER — Encounter: Payer: Self-pay | Admitting: Urology

## 2021-12-25 ENCOUNTER — Other Ambulatory Visit
Admission: RE | Admit: 2021-12-25 | Discharge: 2021-12-25 | Disposition: A | Discharge status: Home / Self Care | Payer: Medicare Other | Attending: Urology | Admitting: Urology

## 2021-12-25 ENCOUNTER — Other Ambulatory Visit: Payer: Self-pay

## 2021-12-25 VITALS — BP 188/97 | HR 80 | Ht 70.0 in | Wt 268.0 lb

## 2021-12-25 DIAGNOSIS — Z85528 Personal history of other malignant neoplasm of kidney: Secondary | ICD-10-CM | POA: Insufficient documentation

## 2021-12-25 DIAGNOSIS — R351 Nocturia: Secondary | ICD-10-CM | POA: Diagnosis present

## 2021-12-25 DIAGNOSIS — Z905 Acquired absence of kidney: Secondary | ICD-10-CM

## 2021-12-25 DIAGNOSIS — N200 Calculus of kidney: Secondary | ICD-10-CM

## 2021-12-25 DIAGNOSIS — R300 Dysuria: Secondary | ICD-10-CM | POA: Insufficient documentation

## 2021-12-25 DIAGNOSIS — R32 Unspecified urinary incontinence: Secondary | ICD-10-CM

## 2021-12-25 LAB — URINALYSIS, COMPLETE (UACMP) WITH MICROSCOPIC
Bilirubin Urine: NEGATIVE
Glucose, UA: NEGATIVE mg/dL
Hgb urine dipstick: NEGATIVE
Nitrite: NEGATIVE
Protein, ur: NEGATIVE mg/dL
Specific Gravity, Urine: >1.03 — ABNORMAL HIGH (ref 1.005–1.030)
pH: 5 (ref 5.0–8.0)

## 2021-12-25 LAB — BLADDER SCAN AMB NON-IMAGING

## 2021-12-27 LAB — URINE CULTURE

## 2022-01-01 ENCOUNTER — Ambulatory Visit: Payer: Medicare Other

## 2022-01-01 ENCOUNTER — Other Ambulatory Visit: Payer: Self-pay

## 2022-01-01 DIAGNOSIS — R300 Dysuria: Secondary | ICD-10-CM

## 2022-01-01 NOTE — Progress Notes (Signed)
In and Out Catheterization  Patient is present today for a I & O catheterization due to nocturia. Patient was cleaned and prepped in a sterile fashion with betadine . A 14FR cath was inserted no complications were noted , 9ml of urine return was noted, urine was dark yellow in color. A clean urine sample was collected for urinalysis and culture. Bladder was drained  And catheter was removed with out difficulty.    Performed by: Bradly Bienenstock CMA

## 2022-01-02 LAB — MICROSCOPIC EXAMINATION
Bacteria, UA: NONE SEEN
Epithelial Cells (non renal): NONE SEEN /hpf (ref 0–10)

## 2022-01-02 LAB — URINALYSIS, COMPLETE
Bilirubin, UA: NEGATIVE
Glucose, UA: NEGATIVE
Leukocytes,UA: NEGATIVE
Nitrite, UA: NEGATIVE
Protein,UA: NEGATIVE
RBC, UA: NEGATIVE
Specific Gravity, UA: >1.03 — ABNORMAL HIGH (ref 1.005–1.030)
Urobilinogen, Ur: 0.2 mg/dL (ref 0.2–1.0)
pH, UA: 5.5 (ref 5.0–7.5)

## 2022-01-04 LAB — CULTURE, URINE COMPREHENSIVE

## 2022-01-16 ENCOUNTER — Other Ambulatory Visit: Payer: Self-pay

## 2022-01-16 ENCOUNTER — Ambulatory Visit (INDEPENDENT_AMBULATORY_CARE_PROVIDER_SITE_OTHER): Payer: Medicare Other | Admitting: Podiatry

## 2022-01-16 ENCOUNTER — Encounter: Payer: Self-pay | Admitting: Podiatry

## 2022-01-16 DIAGNOSIS — M10072 Idiopathic gout, left ankle and foot: Secondary | ICD-10-CM

## 2022-01-16 DIAGNOSIS — M7751 Other enthesopathy of right foot: Secondary | ICD-10-CM | POA: Diagnosis not present

## 2022-01-16 DIAGNOSIS — M7752 Other enthesopathy of left foot: Secondary | ICD-10-CM | POA: Diagnosis not present

## 2022-01-16 MED ORDER — BETAMETHASONE SOD PHOS & ACET 6 (3-3) MG/ML IJ SUSP
3.0000 mg | Freq: Once | INTRAMUSCULAR | Status: AC
  Administered 2022-01-16: 3 mg via INTRA_ARTICULAR

## 2022-01-16 NOTE — Progress Notes (Signed)
HPI: 76 y.o. female presenting today for an acute gout flareup.  Patient states that she is having gout to the bilateral great toe joints.  The left is worse than the right.  This began a few days ago.  Is very tender to palpation and red and swollen.  She presents for further treatment and evaluation.  Past Medical History:  Diagnosis Date   Anxiety    Arthritis    osteoarthritis   CAD (coronary artery disease)    a. 2012 ETT: no ischemia;  b. 06/2017 NSTEMI/PCI: LM nl, LAD nl, D1 70-80p, LCX nl, OM1 90 (3.0 x 23 Xience [place]), RCA dominant, 100p/m, fills via L->R collats, EF 55-65%.   Cancer [facility])    a. s/p partial left nephrectomy.   Chicken pox    CME (cystoid macular edema), left 11/20/2018   Colon cancer ([place]) 1992   T3, N1, M0. colon    Colon polyp 2011   COVID-19 75/6433   Diastolic dysfunction    a. 08/2015 Echo: EF 60-65%, no rwma, Gr1 DD, midlly dil LA, PASP 57mmHg. No significant valvular dzs; b. 06/2017 Echo: EF 60-65%, Gr1DD, mildly dil LA.   Essential hypertension    GERD (gastroesophageal reflux disease)    Gout    History of appendectomy 06/07/2015   History of kidney cancer 11/20/2018   History of kidney stones    passed - 2   Hyperlipidemia    Lumbar spinal stenosis    Myocardial infarction ([place])    06/2017   Nonexudative age-related macular degeneration, bilateral, early dry stage 11/20/2018   Obesity    Prediabetes    Pseudophakia of both eyes    Retinal cyst     Past Surgical History:  Procedure Laterality Date   ABDOMINAL HYSTERECTOMY     APPENDECTOMY  06/07/2015   CARDIAC CATHETERIZATION     CATARACT EXTRACTION W/PHACO Left 04/24/2016   Procedure: CATARACT EXTRACTION PHACO AND INTRAOCULAR LENS PLACEMENT ([place]);  Surgeon: Birder Robson, MD;  Location: [facility] ORS;  Service: Ophthalmology;  Laterality: Left;  [place] 45.8 AP% 16.4 CDE 7.53 FLUID PACK LOT # [id number]   CATARACT EXTRACTION W/PHACO Right 06/05/2016   Procedure: CATARACT EXTRACTION PHACO AND  INTRAOCULAR LENS PLACEMENT (IOC);  Surgeon: Birder Robson, MD;  Location: [facility] ORS;  Service: Ophthalmology;  Laterality: Right;  [place] 25.9 AP% 21.9 CDE 5.68 Fluid pack lot # [id number]   COLON RESECTION  03/04/1991   COLON SURGERY     COLONOSCOPY  03-14-10   Dr Bary Castilla, tubular adenoma at 25 cm.   COLONOSCOPY WITH PROPOFOL N/A 06/07/2015   Procedure: COLONOSCOPY WITH PROPOFOL;  Surgeon: Robert Bellow, MD;  [facility] ENDOSCOPY;  Service: Endoscopy;  Laterality: N/A;   COLONOSCOPY WITH PROPOFOL N/A 08/10/2020   Procedure: COLONOSCOPY WITH PROPOFOL;  Surgeon: Robert Bellow, MD;  Location: [facility] ENDOSCOPY;  Service: Endoscopy;  Laterality: N/A;   CORONARY STENT INTERVENTION N/A 06/28/2017   Procedure: CORONARY STENT INTERVENTION;  Surgeon: Yolonda Kida, MD;  Location: [place] CV LAB;  Service: Cardiovascular;  Laterality: N/A;   EYE SURGERY     lens eye surgery   HERNIA REPAIR  1601   umbilical   LEFT HEART CATH AND CORONARY ANGIOGRAPHY N/A 06/28/2017   Procedure: LEFT HEART CATH AND CORONARY ANGIOGRAPHY;  Surgeon: Minna Merritts, MD;  Location: [place] CV LAB;  Service: Cardiovascular;  Laterality: N/A;   LUMBAR LAMINECTOMY/DECOMPRESSION MICRODISCECTOMY Bilateral 03/10/2018   Procedure: Laminectomy and Foraminotomy - Lumbar three-Lumbar four - Lumbar  four-Lumbar five - bilateral;  Surgeon: Earnie Larsson, MD;  Location: [place];  Service: Neurosurgery;  Laterality: Bilateral;   NEPHRECTOMY  1992   left- cancer   RIB RESECTION     removal 4 ribs   TONSILLECTOMY      Allergies  Allergen Reactions   Morphine And Related Nausea Only   Prednisone Nausea And Vomiting   Amlodipine Palpitations   Latex Rash     Physical Exam: General: The patient is alert and oriented x3 in no acute distress.  Dermatology: Skin is warm, dry and supple bilateral lower extremities. Negative for open lesions or macerations.  Vascular: Palpable pedal pulses bilaterally. Capillary  refill within normal limits.  Erythema with edema noted to the first MTP joint bilateral  Neurological: Light touch and protective threshold grossly intact  Musculoskeletal Exam: No pedal deformities noted.  Significant tenderness to light touch and palpation of the first MTP joint bilateral  Assessment: 1.  Acute inflammatory gout/capsulitis first MTP bilateral   Plan of Care:  1. Patient evaluated. X-Rays reviewed.  2.  Injection of 0.5 cc Celestone Soluspan injection into the bilateral first MTP joint 3.  Continue colchicine and allopurinol as per PCP 4.  Order placed for uric acid  5.  rEturn to clinic as needed      Edrick Kins, DPM [facility]  Dr. Edrick Kins, DPM    [address], [place] [address]                Office [phone]  Fax [phone]

## 2022-01-17 LAB — URIC ACID: Uric Acid: 5.9 mg/dL (ref 3.1–7.9)

## 2022-01-19 ENCOUNTER — Other Ambulatory Visit: Payer: Self-pay

## 2022-01-19 ENCOUNTER — Ambulatory Visit
Admission: RE | Admit: 2022-01-19 | Discharge: 2022-01-19 | Disposition: A | Discharge status: Home / Self Care | Payer: Medicare Other | Source: Ambulatory Visit | Attending: Urology | Admitting: Urology

## 2022-01-19 DIAGNOSIS — Z85528 Personal history of other malignant neoplasm of kidney: Secondary | ICD-10-CM

## 2022-01-19 DIAGNOSIS — Z905 Acquired absence of kidney: Secondary | ICD-10-CM

## 2022-01-19 DIAGNOSIS — N200 Calculus of kidney: Secondary | ICD-10-CM

## 2022-01-23 ENCOUNTER — Telehealth: Payer: Self-pay

## 2022-01-23 NOTE — Telephone Encounter (Signed)
Recommend rest and ice.  Discontinue colchicine that was prescribed from her PCP.  Have her follow-up in office.  Thanks, Dr. Amalia Hailey

## 2022-01-23 NOTE — Telephone Encounter (Signed)
I attempted to call Carrie Larsen.  I left her a message of Dr. Amalia Hailey' response which was the recommendation to rest and ice.  Discontinue colchicine that was prescribed from your PCP.  Follow-up with Dr. Amalia Hailey in the office.  I asked her to give [place] a call to schedule an appointment for Friday.

## 2022-01-23 NOTE — Telephone Encounter (Signed)
Patient called stating that since her injection last week,  her feet feel like they are on fire, she has been really nauseated, flushing and hot, cant sleep or hardly work.  She was wanting to know if you could send her in something to help or what she should do?  Please advise

## 2022-01-25 ENCOUNTER — Other Ambulatory Visit: Payer: Self-pay

## 2022-01-25 ENCOUNTER — Ambulatory Visit: Payer: Medicare Other | Admitting: Podiatry

## 2022-01-25 ENCOUNTER — Ambulatory Visit (INDEPENDENT_AMBULATORY_CARE_PROVIDER_SITE_OTHER): Payer: Medicare Other | Admitting: Podiatry

## 2022-01-25 DIAGNOSIS — M7752 Other enthesopathy of left foot: Secondary | ICD-10-CM

## 2022-01-25 DIAGNOSIS — M7751 Other enthesopathy of right foot: Secondary | ICD-10-CM | POA: Diagnosis not present

## 2022-01-31 NOTE — Progress Notes (Signed)
?Subjective:  ?Patient ID: Carrie Larsen, female    DOB: 1946/03/25,  MRN: [id number] ? ?No chief complaint on file. ? ? ?76 y.o. female presents with the above complaint.  Patient presents with complaint of reaction to the SARS shot where there is redness soreness.  She had a shot given by Dr. Amalia Hailey to the bilateral irst metatarsophalangeal joint.  She states after discharge she felt very diaphoretic and clammy clammy feeling with a lot of soreness and redness.  She wanted to get it evaluated however is doing much better today her pain has dissipated as well.  She denies any other acute complaints ? ? ?Review of Systems: Negative except as noted in the HPI. Denies N/V/F/Ch. ? ?Past Medical History:  ?Diagnosis Date  ? Anxiety   ? Arthritis   ? osteoarthritis  ? CAD (coronary artery disease)   ? a. 2012 ETT: no ischemia;  b. 06/2017 NSTEMI/PCI: LM nl, LAD nl, D1 70-80p, LCX nl, OM1 90 (3.0 x 23 Xience [place]), RCA dominant, 100p/m, fills via L->R collats, EF 55-65%.  ? Cancer ([place])   ? a. s/p partial left nephrectomy.  ? Chicken pox   ? CME (cystoid macular edema), left 11/20/2018  ? Colon cancer ([place]) 1992  ? T3, N1, M0. colon   ? Colon polyp 2011  ? COVID-19 07/2019  ? Diastolic dysfunction   ? a. 08/2015 Echo: EF 60-65%, no rwma, Gr1 DD, midlly dil LA, PASP 69mHg. No significant valvular dzs; b. 06/2017 Echo: EF 60-65%, Gr1DD, mildly dil LA.  ? Essential hypertension   ? GERD (gastroesophageal reflux disease)   ? Gout   ? History of appendectomy 06/07/2015  ? History of kidney cancer 11/20/2018  ? History of kidney stones   ? passed - 2  ? Hyperlipidemia   ? Lumbar spinal stenosis   ? My[facility] infarction ([facility]   ? 06/2017  ? Nonexudative age-related macular degeneration, bilateral, early dry stage 11/20/2018  ? Obesity   ? Prediabetes   ? Pseudophakia of both eyes   ? Retinal cyst   ? ? ?Current Outpatient Medications:  ?  allopurinol (ZYLOPRIM) 100 MG tablet, Take 2 tablets (200 mg total) by mouth daily., Disp:  60 tablet, Rfl: 2 ?  aspirin EC 81 MG tablet, Take 81 mg by mouth daily. Swallow whole., Disp: , Rfl:  ?  atorvastatin (LIPITOR) 80 MG tablet, Take 1 tablet by mouth once daily, Disp: 90 tablet, Rfl: 3 ?  carvedilol (COREG) 12.5 MG tablet, Take 1 tablet (12.5 mg total) by mouth 2 (two) times daily., Disp: 180 tablet, Rfl: 3 ?  clopidogrel (PLAVIX) 75 MG tablet, Take 1 tablet by mouth once daily, Disp: 90 tablet, Rfl: 0 ?  colchicine 0.6 MG tablet, TAKE 1 TABLET BY MOUTH THREE TIMES DAILY UNTIL STOMACH UPSET OR FLARE SUBSIDES. THEN FOR MAINTENANCE TAKE 1 TABLET ONCE DAILY, Disp: 90 tablet, Rfl: 0 ?  esomeprazole (NEXIUM) 40 MG capsule, Take 40 mg by mouth daily in the afternoon., Disp: , Rfl:  ?  gabapentin (NEURONTIN) 100 MG capsule, Take 100 mg by mouth 3 (three) times daily as needed., Disp: , Rfl:  ?  isosorbide mononitrate (IMDUR) 30 MG 24 hr tablet, Take 1 tablet (30 mg total) by mouth in the morning and at bedtime., Disp: 60 tablet, Rfl: 3 ?  lidocaine (LIDODERM) 5 %, Place 1 patch onto the skin daily. Remove & Discard patch within 12 hours or as directed by MD, Disp: , Rfl:  ?  Multiple Vitamins-Minerals (MULTIVITAMIN WITH MINERALS) tablet, Take 1 tablet by mouth daily., Disp: , Rfl:  ?  nitroGLYCERIN (NITROSTAT) 0.4 MG SL tablet, Place 1 tablet (0.4 mg total) under the tongue every 5 (five) minutes x 3 doses as needed for chest pain., Disp: 25 tablet, Rfl: 1 ?  traMADol (ULTRAM) 50 MG tablet, Take 50 mg by mouth daily. , Disp: , Rfl:  ? ?Current Facility-Administered Medications:  ?  betamethasone acetate-betamethasone sodium phosphate (CELESTONE) injection 3 mg, 3 mg, Intra-articular, Once, Evans, Dorathy Daft, DPM ?  dexamethasone (DECADRON) injection 4 mg, 4 mg, Other, Once, McDonald, Adam R, DPM ?  triamcinolone acetonide (KENALOG) 10 MG/ML injection 10 mg, 10 mg, Other, Once, Criselda Peaches, DPM ? ?Social History  ? ?Tobacco Use  ?Smoking Status Never  ?Smokeless Tobacco Never  ? ? ?Allergies  ?Allergen  Reactions  ? Morphine And Related Nausea Only  ? Prednisone Nausea And Vomiting  ? Amlodipine Palpitations  ? Latex Rash  ? ?Objective:  ?There were no vitals filed for this visit. ?There is no height or weight on file to calculate BMI. ?Constitutional Well developed. ?Well nourished.  ?Vascular Dorsalis pedis pulses palpable bilaterally. ?Posterior tibial pulses palpable bilaterally. ?Capillary refill normal to all digits.  ?No cyanosis or clubbing noted. ?Pedal hair growth normal.  ?Neurologic Normal speech. ?Oriented to person, place, and time. ?Epicritic sensation to light touch grossly present bilaterally.  ?Dermatologic Nails well groomed and normal in appearance. ?No open wounds. ?No skin lesions.  ?Orthopedic: Mild pain at the first metatarsophalangeal joint.  No redness soreness noted.  No signs of allergic reaction to the shot noted.  ? ?Radiographs: None ?Assessment:  ? ?1. Capsulitis of metatarsophalangeal (MTP) joint of left foot   ?2. Capsulitis of metatarsophalangeal (MTP) joint of right foot   ? ?Plan:  ?Patient was evaluated and treated and all questions answered. ? ?Bilateral first metatarsophalangeal joint capsulitis with a history of steroid injection ?-All questions and concerns were discussed with the patient in extensive detail ?-Clinically I am unable to appreciate any allergic reaction.  However I discussed with the patient if it continues to happen again we will hold off on steroid injection.  She has received the similar steroid injections in the past.  It is clinically improved and resolved therefore I discussed with her she can follow-up with Dr. Amalia Hailey when she is ready for another injection. ? ?No follow-ups on file.  ?

## 2022-02-11 NOTE — Progress Notes (Incomplete)
02/11/22 ?11:17 AM  ? ?[place] ?[birth year] ?[id number] ? ?Referring provider:  ?Rutherford Limerick, PA ?[place] ?[place],  [place] [address] ?No chief complaint on file. ? ? ?Urological history  ?1. Renal cell carcinoma ?- s/p left nephrectomy ~27 years ago for RCC ?- RUS on 01/19/2022 visualized simple right renal cyst  ?  ?2. Nephrolithiasis ?- no documented stones > 10 years ? ?3. Nocturia  ? ? ?HPI: ?Carrie Larsen is a 76 y.o.female ? ? ? ? ? ?PMH: ?Past Medical History:  ?Diagnosis Date  ? Anxiety   ? Arthritis   ? osteoarthritis  ? CAD (coronary artery disease)   ? a. 2012 ETT: no ischemia;  b. 06/2017 NSTEMI/PCI: LM nl, LAD nl, D1 70-80p, LCX nl, OM1 90 (3.0 x 23 Xience [place]), RCA dominant, 100p/m, fills via L->R collats, EF 55-65%.  ? Cancer ([place])   ? a. s/p partial left nephrectomy.  ? Chicken pox   ? CME (cystoid macular edema), left 11/20/2018  ? Colon cancer ([facility]) 1992  ? T3, N1, M0. colon   ? Colon polyp 2011  ? COVID-19 07/2019  ? Diastolic dysfunction   ? a. 08/2015 Echo: EF 60-65%, no rwma, Gr1 DD, midlly dil LA, PASP 36mHg. No significant valvular dzs; b. 06/2017 Echo: EF 60-65%, Gr1DD, mildly dil LA.  ? Essential hypertension   ? GERD (gastroesophageal reflux disease)   ? Gout   ? History of appendectomy 06/07/2015  ? History of kidney cancer 11/20/2018  ? History of kidney stones   ? passed - 2  ? Hyperlipidemia   ? Lumbar spinal stenosis   ? Myocardial infarction ([facility]   ? 06/2017  ? Nonexudative age-related macular degeneration, bilateral, early dry stage 11/20/2018  ? Obesity   ? Prediabetes   ? Pseudophakia of both eyes   ? Retinal cyst   ? ? ?Surgical History: ?Past Surgical History:  ?Procedure Laterality Date  ? ABDOMINAL HYSTERECTOMY    ? APPENDECTOMY  06/07/2015  ? CARDIAC CATHETERIZATION    ? CATARACT EXTRACTION W/PHACO Left 04/24/2016  ? Procedure: CATARACT EXTRACTION PHACO AND INTRAOCULAR LENS PLACEMENT (IOC);  Surgeon: WBirder Robson MD;  Location: [facility] ORS;  Service:  Ophthalmology;  Laterality: Left;  [place]45.8 ?AP% 16.4 ?CDE 7.53 ?FLUID PACK LOT # [id number]  ? CATARACT EXTRACTION W/PHACO Right 06/05/2016  ? Procedure: CATARACT EXTRACTION PHACO AND INTRAOCULAR LENS PLACEMENT (IOC);  Surgeon: WBirder Robson MD;  Location: [facility] ORS;  Service: Ophthalmology;  Laterality: Right;  [place]25.9 ?AP% 21.9 ?CDE 5.68 ?Fluid pack lot # [id number]  ? COLON RESECTION  03/04/1991  ? COLON SURGERY    ? COLONOSCOPY  03-14-10  ? Dr BBary Castilla tubular adenoma at 25 cm.  ? COLONOSCOPY WITH PROPOFOL N/A 06/07/2015  ? Procedure: COLONOSCOPY WITH PROPOFOL;  Surgeon: JRobert Bellow MD;  [facility];  Service: Endoscopy;  Laterality: N/A;  ? COLONOSCOPY WITH PROPOFOL N/A 08/10/2020  ? Procedure: COLONOSCOPY WITH PROPOFOL;  Surgeon: BRobert Bellow MD;  [facility];  Service: Endoscopy;  Laterality: N/A;  ? CORONARY STENT INTERVENTION N/A 06/28/2017  ? Procedure: CORONARY STENT INTERVENTION;  Surgeon: CYolonda Kida MD;  [facility];  Service: Cardiovascular;  Laterality: N/A;  ? EYE SURGERY    ? lens eye surgery  ? HERNIA REPAIR  [id number] ? umbilical  ? LEFT HEART CATH AND CORONARY ANGIOGRAPHY N/A 06/28/2017  ? Procedure: LEFT HEART CATH AND CORONARY ANGIOGRAPHY;  Surgeon: GMinna Merritts  MD;  Location: [place] [facility];  Service: Cardiovascular;  Laterality: N/A;  ? LUMBAR LAMINECTOMY/DECOMPRESSION MICRODISCECTOMY Bilateral 03/10/2018  ? Procedure: Laminectomy and Foraminotomy - Lumbar three-Lumbar four - Lumbar four-Lumbar five - bilateral;  Surgeon: Earnie Larsson, MD;  Location: [place];  Service: Neurosurgery;  Laterality: Bilateral;  ? NEPHRECTOMY  1992  ? left- cancer  ? RIB RESECTION    ? removal 4 ribs  ? TONSILLECTOMY    ? ? ?Home Medications:  ?Allergies as of 02/12/2022   ? ?   Reactions  ? Morphine And Related Nausea Only  ? Prednisone Nausea And Vomiting  ? Amlodipine Palpitations  ? Latex Rash  ? ?  ? ?  ?Medication List  ?  ? ?  ? Accurate as of  February 11, 2022 11:17 AM. If you have any questions, ask your nurse or doctor.  ?  ?  ? ?  ? ?allopurinol 100 MG tablet ?Commonly known as: ZYLOPRIM ?Take 2 tablets (200 mg total) by mouth daily. ?  ?aspirin EC 81 MG tablet ?Take 81 mg by mouth daily. Swallow whole. ?  ?atorvastatin 80 MG tablet ?Commonly known as: LIPITOR ?Take 1 tablet by mouth once daily ?  ?carvedilol 12.5 MG tablet ?Commonly known as: COREG ?Take 1 tablet (12.5 mg total) by mouth 2 (two) times daily. ?  ?clopidogrel 75 MG tablet ?Commonly known as: PLAVIX ?Take 1 tablet by mouth once daily ?  ?colchicine 0.6 MG tablet ?TAKE 1 TABLET BY MOUTH THREE TIMES DAILY UNTIL STOMACH UPSET OR FLARE SUBSIDES. THEN FOR MAINTENANCE TAKE 1 TABLET ONCE DAILY ?  ?esomeprazole 40 MG capsule ?Commonly known as: [place] ?Take 40 mg by mouth daily in the afternoon. ?  ?gabapentin 100 MG capsule ?Commonly known as: NEURONTIN ?Take 100 mg by mouth 3 (three) times daily as needed. ?  ?isosorbide mononitrate 30 MG 24 hr tablet ?Commonly known as: IMDUR ?Take 1 tablet (30 mg total) by mouth in the morning and at bedtime. ?  ?lidocaine 5 % ?Commonly known as: LIDODERM ?Place 1 patch onto the skin daily. Remove & Discard patch within 12 hours or as directed by MD ?  ?multivitamin with minerals tablet ?Take 1 tablet by mouth daily. ?  ?nitroGLYCERIN 0.4 MG SL tablet ?Commonly known as: NITROSTAT ?Place 1 tablet (0.4 mg total) under the tongue every 5 (five) minutes x 3 doses as needed for chest pain. ?  ?traMADol 50 MG tablet ?Commonly known as: ULTRAM ?Take 50 mg by mouth daily. ?  ? ?  ? ? ?Allergies:  ?Allergies  ?Allergen Reactions  ? Morphine And Related Nausea Only  ? Prednisone Nausea And Vomiting  ? Amlodipine Palpitations  ? Latex Rash  ? ? ?Family History: ?Family History  ?Problem Relation Age of Onset  ? Colon cancer Mother   ? Cerebral aneurysm Father   ? Heart attack Father   ? Heart attack Brother 82  ? Healthy Son   ? ? ?Social History:  reports that she has  never smoked. She has never used smokeless tobacco. She reports that she does not drink alcohol and does not use drugs. ? ? ?Physical Exam: ?There were no vitals taken for this visit.  ?Constitutional:  Alert and oriented, No acute distress. ?HEENT: [place] AT, moist mucus membranes.  Trachea midline, no masses. ?Cardiovascular: No clubbing, cyanosis, or edema. ?Respiratory: Normal respiratory effort, no increased work of breathing. ?GI: Abdomen is soft, nontender, nondistended, no abdominal masses ?GU: No CVA tenderness ?Lymph: No cervical  or inguinal lymphadenopathy. ?Skin: No rashes, bruises or suspicious lesions. ?Neurologic: Grossly intact, no focal deficits, moving all 4 extremities. ?Psychiatric: Normal mood and affect. ? ?Laboratory Data: ? ?Lab Results  ?Component Value Date  ? CREATININE 0.99 08/27/2021  ? ? ? ?Lab Results  ?Component Value Date  ? HGBA1C 5.8 (H) 06/27/2017  ? ? ?Urinalysis ? ? ?Pertinent Imaging: ?CLINICAL DATA:  Solitary kidney. ?  ?EXAM: ?RENAL / URINARY TRACT ULTRASOUND COMPLETE ?  ?COMPARISON:  None. ?  ?FINDINGS: ?Right Kidney: ?  ?Renal measurements: 14.0 cm x 6.0 cm x 7.7 cm = volume: 337 mL. ?Echogenicity within normal limits. A 4.3 cm x 4.2 cm x 4.2 cm ?anechoic structure is seen within the lower pole of the right ?kidney. This is seen on the prior study and is mildly increased in ?size when compared to the prior exam (measured approximately 3.7 cm ?x 3.5 cm x 4.2 cm on the prior study) no abnormal flow is noted ?within this region on color Doppler evaluation. No hydronephrosis is ?visualized. ?  ?Left Kidney: ?  ?The left kidney is surgically absent. ?  ?Bladder: ?  ?Appears normal for degree of bladder distention. ?  ?Other: ?  ?None. ?  ?IMPRESSION: ?1. Findings consistent with history of prior left nephrectomy. ?2. Simple right renal cyst. ?  ?  ?Electronically Signed ?  By: Virgina Norfolk M.D. ?  On: 01/22/2022 21:30 ? ? ?I have personally reviewed the images and agree with  radiologist interpretation.  ? ? ?Assessment & Plan:   ? ? ?No follow-ups on file. ? ?[facility] ?[address], [address] ?[place], [place] [address] ?[phone] ? ?Synthia Innocent

## 2022-02-12 ENCOUNTER — Ambulatory Visit: Payer: Medicare Other | Admitting: Urology

## 2022-02-19 NOTE — Progress Notes (Signed)
Cardiology Office Note ? ?Date:  02/20/2022  ? ?ID:  Carrie Larsen, DOB 26-Jan-1946, MRN [id number] ? ?PCP:  Carrie Limerick, PA  ? ?Chief Complaint  ?Patient presents with  ? 6 month follow up   ?  "Doing well." Medications reviewed by the patient verbally.   ? ? ?HPI:  ?Carrie Larsen is a very pleasant 76 year old woman with a history of  ?Coronary artery disease PCI to an OM vessel 06/2017 ?Occluded RCA with collaterals from left to right ?hyperlipidemia,  ?colon cancer,  ?status post nephrectomy on the left, ?who presents for routine followup of her coronary artery disease, hyperlipidemia ? 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 ?Weight 265 pounds ?Has arthritis pain ? ?Feels her blood pressure is stable, ?No recent flares up of her gout ?Does not check blood pressure at home ?Chronic back pain ? ?EKG personally reviewed by myself on todays visit ?Nsr rate 66 bpm consider old inferior MI ? ?Other past medical history reviewed ?Seen in the hospital September 2020 for chest pain ?Cardiac enzymes negative ?EKG unchanged ?Felt to be most likely musculoskeletal ? ?Other past medical history reviewed ?Hospital admission August 2018 for non-STEMI ?Troponin up to 0.88 ?CT scan with no PE ?Cardiac catheterization with PCI of OM vessel ?There is occluded proximal to mid RCA with collaterals from left to right, appeared chronic ? ?Echocardiogram with ejection fraction 55-65% ?  ?She reports having an episode of left-sided numbness on her face 11/14/2013. Workup was negative. Including cardiac enzymes, basic metabolic panels, CBC, chest x-ray and urinalysis ?  ?  ?PMH:   has a past medical history of Anxiety, Arthritis, CAD (coronary artery disease), Cancer ([place]), Chicken pox, CME (cystoid macular edema), left  (11/20/2018), Colon cancer ([place]) (1992), Colon polyp (2011), COVID-19 (54/2706), Diastolic dysfunction, Essential hypertension, GERD (gastroesophageal reflux disease), Gout, History of appendectomy (06/07/2015), History of kidney cancer (11/20/2018), History of kidney stones, Hyperlipidemia, Lumbar spinal stenosis, Myocardial infarction (Carrie Larsen), Nonexudative age-related macular degeneration, bilateral, early dry stage (11/20/2018), Obesity, Prediabetes, Pseudophakia of both eyes, and Retinal cyst. ? ?PSH:    ?Past Surgical History:  ?Procedure Laterality Date  ? ABDOMINAL HYSTERECTOMY    ? APPENDECTOMY  06/07/2015  ? CARDIAC CATHETERIZATION    ? CATARACT EXTRACTION W/PHACO Left 04/24/2016  ? Procedure: CATARACT EXTRACTION PHACO AND INTRAOCULAR LENS PLACEMENT (IOC);  Surgeon: Carrie Robson, MD;  Location: [facility];  Service: Ophthalmology;  Laterality: Left;  [place] 45.8 ?AP% 16.4 ?CDE 7.53 ?FLUID PACK LOT # [id number]  ? CATARACT EXTRACTION W/PHACO Right 06/05/2016  ? Procedure: CATARACT EXTRACTION PHACO AND INTRAOCULAR LENS PLACEMENT (IOC);  Surgeon: Carrie Robson, MD;  Location: [facility];  Service: Ophthalmology;  Laterality: Right;  [place] 25.9 ?AP% 21.9 ?CDE 5.68 ?Fluid pack lot # [id number]  ? COLON RESECTION  03/04/1991  ? COLON SURGERY    ? COLONOSCOPY  03-14-10  ? Carrie Larsen, tubular adenoma at 25 cm.  ? COLONOSCOPY WITH PROPOFOL N/A 06/07/2015  ? Procedure: COLONOSCOPY WITH PROPOFOL;  Surgeon: Carrie Bellow, MD;  Location: [facility];  Service: Endoscopy;  Laterality: N/A;  ? COLONOSCOPY WITH PROPOFOL N/A 08/10/2020  ? Procedure: COLONOSCOPY WITH PROPOFOL;  Surgeon: Carrie Bellow, MD;  Location: [facility];  Service: Endoscopy;  Laterality: N/A;  ?  CORONARY STENT INTERVENTION N/A 06/28/2017  ? Procedure: CORONARY STENT INTERVENTION;  Surgeon: Carrie Kida, MD;  Location: [place] CV LAB;  Service: Cardiovascular;  Laterality: N/A;  ? EYE SURGERY    ? lens eye surgery  ? HERNIA REPAIR  1610  ?  umbilical  ? LEFT HEART CATH AND CORONARY ANGIOGRAPHY N/A 06/28/2017  ? Procedure: LEFT HEART CATH AND CORONARY ANGIOGRAPHY;  Surgeon: Carrie Merritts, MD;  Location: [place] [facility];  Service: Cardiovascular;  Laterality: N/A;  ? LUMBAR LAMINECTOMY/DECOMPRESSION MICRODISCECTOMY Bilateral 03/10/2018  ? Procedure: Laminectomy and Foraminotomy - Lumbar three-Lumbar four - Lumbar four-Lumbar five - bilateral;  Surgeon: Carrie Larsson, MD;  Location: [place];  Service: Neurosurgery;  Laterality: Bilateral;  ? NEPHRECTOMY  1992  ? left- cancer  ? RIB RESECTION    ? removal 4 ribs  ? TONSILLECTOMY    ? ? ?Current Outpatient Medications  ?Medication Sig Dispense Refill  ? allopurinol (ZYLOPRIM) 100 MG tablet Take 2 tablets (200 mg total) by mouth daily. 60 tablet 2  ? aspirin EC 81 MG tablet Take 81 mg by mouth daily. Swallow whole.    ? atorvastatin (LIPITOR) 80 MG tablet Take 1 tablet by mouth once daily 90 tablet 3  ? carvedilol (COREG) 12.5 MG tablet Take 1 tablet (12.5 mg total) by mouth 2 (two) times daily. 180 tablet 3  ? clopidogrel (PLAVIX) 75 MG tablet Take 1 tablet by mouth once daily 90 tablet 0  ? colchicine 0.6 MG tablet TAKE 1 TABLET BY MOUTH THREE TIMES DAILY UNTIL STOMACH UPSET OR FLARE SUBSIDES. THEN FOR MAINTENANCE TAKE 1 TABLET ONCE DAILY 90 tablet 0  ? esomeprazole (NEXIUM) 40 MG capsule Take 40 mg by mouth daily in the afternoon.    ? gabapentin (NEURONTIN) 100 MG capsule Take 100 mg by mouth 3 (three) times daily as needed.    ? isosorbide mononitrate (IMDUR) 30 MG 24 hr tablet Take 1 tablet (30 mg total) by mouth in the morning and at bedtime. 60 tablet 3  ? lidocaine (LIDODERM) 5 % Place 1 patch onto the skin daily. Remove & Discard patch within 12 hours or as directed by MD    ? Multiple Vitamins-Minerals (MULTIVITAMIN WITH MINERALS) tablet Take 1 tablet by mouth daily.    ? nitroGLYCERIN (NITROSTAT) 0.4 MG SL tablet Place 1 tablet (0.4 mg total) under the tongue every 5 (five) minutes x 3 doses as  needed for chest pain. 25 tablet 1  ? traMADol (ULTRAM) 50 MG tablet Take 50 mg by mouth daily.     ? ?Current Facility-Administered Medications  ?Medication Dose Route Frequency Provider Last Rate Last Admin  ? betamethasone acetate-betamethasone sodium phosphate (CELESTONE) injection 3 mg  3 mg Intra-articular Once Edrick Kins, DPM      ? dexamethasone (DECADRON) injection 4 mg  4 mg Other Once Criselda Peaches, DPM      ? triamcinolone acetonide (KENALOG) 10 MG/ML injection 10 mg  10 mg Other Once Criselda Peaches, DPM      ? ? ?Allergies:   Morphine and related, Prednisone, Amlodipine, and Latex  ? ?Social History:  The patient  reports that she has never smoked. She has never used smokeless tobacco. She reports that she does not drink alcohol and does not use drugs.  ? ?Family History:   family history includes Cerebral aneurysm in her father; Colon cancer in her mother; Healthy in her son; Heart attack in her father; Heart attack (age of  onset: 71) in her brother.  ? ? ?Review of Systems: ?Review of Systems  ?Constitutional: Negative.   ?Respiratory: Negative.    ?Cardiovascular: Negative.   ?Gastrointestinal: Negative.   ?Musculoskeletal:  Positive for back pain.  ?Neurological: Negative.   ?Psychiatric/Behavioral: Negative.    ?All other systems reviewed and are negative. ?PHYSICAL EXAM: ?VS:  BP (!) 140/92 (BP Location: Left Arm, Patient Position: Sitting, Cuff Size: Normal)   Pulse 66   Ht '5\' 10"'$  (1.778 m)   Wt 265 lb 4 oz (120.3 kg)   SpO2 98%   BMI 38.06 kg/m?  , BMI Body mass index is 38.06 kg/m?Marland Kitchen  ?Constitutional:  oriented to person, place, and time. No distress.  ?HENT:  ?Head: Grossly normal ?Eyes:  no discharge. No scleral icterus.  ?Neck: No JVD, no carotid bruits  ?Cardiovascular: Regular rate and rhythm, no murmurs appreciated ?Pulmonary/Chest: Clear to auscultation bilaterally, no wheezes or rails ?Abdominal: Soft.  no distension.  no tenderness.  ?Musculoskeletal: Normal range of  motion ?Neurological:  normal muscle tone. Coordination normal. No atrophy ?Skin: Skin warm and dry ?Psychiatric: normal affect, pleasant ? ?Recent Labs: ?04/25/2021: ALT 46 ?08/27/2021: BUN 17; Creatinine, Ser

## 2022-02-20 ENCOUNTER — Other Ambulatory Visit: Payer: Self-pay

## 2022-02-20 ENCOUNTER — Ambulatory Visit (INDEPENDENT_AMBULATORY_CARE_PROVIDER_SITE_OTHER): Payer: Medicare Other | Admitting: Cardiovascular Disease

## 2022-02-20 ENCOUNTER — Encounter: Payer: Self-pay | Admitting: Cardiovascular Disease

## 2022-02-20 VITALS — BP 140/92 | HR 66 | Ht 70.0 in | Wt 265.2 lb

## 2022-02-20 DIAGNOSIS — I2510 Atherosclerotic heart disease of native coronary artery without angina pectoris: Secondary | ICD-10-CM | POA: Diagnosis not present

## 2022-02-20 DIAGNOSIS — R002 Palpitations: Secondary | ICD-10-CM

## 2022-02-20 DIAGNOSIS — E785 Hyperlipidemia, unspecified: Secondary | ICD-10-CM

## 2022-02-20 DIAGNOSIS — I10 Essential (primary) hypertension: Secondary | ICD-10-CM | POA: Diagnosis not present

## 2022-02-20 NOTE — Patient Instructions (Addendum)
We will request labs from PMD ? ?Medication Instructions:  ?Ok to stop plavix ? ?If you need a refill on your cardiac medications before your next appointment, please call your pharmacy.  ? ?Lab work: ?No new labs needed ? ?Testing/Procedures: ?No new testing needed ? ?Follow-Up: ?At [facility], you and your health needs are our priority.  As part of our continuing mission to provide you with exceptional heart care, we have created designated Provider Care Teams.  These Care Teams include your primary Cardiologist (physician) and Advanced Practice Providers (APPs -  Physician Assistants and Nurse Practitioners) who all work together to provide you with the care you need, when you need it. ? ?You will need a follow up appointment in 12 months ? ?Providers on your designated Care Team:   ?Murray Hodgkins, NP ?Christell Faith, PA-C ?Cadence Kathlen Mody, PA-C ? ?COVID-19 Vaccine Information can be found at: [email or web address] For questions related to vaccine distribution or appointments, please email [email or web address] or call [phone].  ? ?

## 2022-02-25 NOTE — Progress Notes (Signed)
03/05/22 ?9:17 PM  ? ?[place] ?Aug 19, 1946 ?[id number] ? ?Referring provider:  ?Rutherford Limerick, PA ?[place] [address],  [place] [address] ? ?Chief Complaint  ?Patient presents with  ? Follow-up  ?  Discuss renal ultrasound  ? ? ?Urological history  ?1. Renal cell carcinoma ?- s/p left nephrectomy ~27 years ago for RCC ?- RUS 12/2021 - NED ?  ?2. Nephrolithiasis ?- no documented stones > 10 years ?- RUS on 01/22/2022 showed no stone burden  ? ? ?HPI: ?Carrie Larsen is a 76 y.o.female who presents today for a 1 year follow-up with RUS results. ? ?She reports that she has urge to urinate during the night , but she denies any urinary leakage during the daytime.  ? ?Patient denies any modifying or aggravating factors.  Patient denies any gross hematuria, dysuria or suprapubic/flank pain. Patient denies any fevers, chills, nausea or vomiting.  ? ?PMH: ?Past Medical History:  ?Diagnosis Date  ? Anxiety   ? Arthritis   ? osteoarthritis  ? CAD (coronary artery disease)   ? a. 2012 ETT: no ischemia;  b. 06/2017 NSTEMI/PCI: LM nl, LAD nl, D1 70-80p, LCX nl, OM1 90 (3.0 x 23 Xience [place]), RCA dominant, 100p/m, fills via L->R collats, EF 55-65%.  ? Cancer ([facility])   ? a. s/p partial left nephrectomy.  ? Chicken pox   ? CME (cystoid macular edema), left 11/20/2018  ? Colon cancer ([place]) 1992  ? T3, N1, M0. colon   ? Colon polyp 2011  ? COVID-19 07/2019  ? Diastolic dysfunction   ? a. 08/2015 Echo: EF 60-65%, no rwma, Gr1 DD, midlly dil LA, PASP 40mHg. No significant valvular dzs; b. 06/2017 Echo: EF 60-65%, Gr1DD, mildly dil LA.  ? Essential hypertension   ? GERD (gastroesophageal reflux disease)   ? Gout   ? History of appendectomy 06/07/2015  ? History of kidney cancer 11/20/2018  ? History of kidney stones   ? passed - 2  ? Hyperlipidemia   ? Lumbar spinal stenosis   ? Myocardial infarction ([facility]   ? 06/2017  ? Nonexudative age-related macular degeneration, bilateral, early dry stage 11/20/2018  ? Obesity   ? Prediabetes   ?  Pseudophakia of both eyes   ? Retinal cyst   ? ? ?Surgical History: ?Past Surgical History:  ?Procedure Laterality Date  ? ABDOMINAL HYSTERECTOMY    ? APPENDECTOMY  06/07/2015  ? CARDIAC CATHETERIZATION    ? CATARACT EXTRACTION W/PHACO Left 04/24/2016  ? Procedure: CATARACT EXTRACTION PHACO AND INTRAOCULAR LENS PLACEMENT (IOC);  Surgeon: WBirder Robson MD;  Location: [facility] ORS;  Service: Ophthalmology;  Laterality: Left;  [place]45.8 ?AP% 16.4 ?CDE 7.53 ?FLUID PACK LOT # [id number]  ? CATARACT EXTRACTION W/PHACO Right 06/05/2016  ? Procedure: CATARACT EXTRACTION PHACO AND INTRAOCULAR LENS PLACEMENT (IOC);  Surgeon: WBirder Robson MD;  Location: [facility] ORS;  Service: Ophthalmology;  Laterality: Right;  [place]25.9 ?AP% 21.9 ?CDE 5.68 ?Fluid pack lot # [id number]  ? COLON RESECTION  03/04/1991  ? COLON SURGERY    ? COLONOSCOPY  03-14-10  ? Dr BBary Castilla tubular adenoma at 25 cm.  ? COLONOSCOPY WITH PROPOFOL N/A 06/07/2015  ? Procedure: COLONOSCOPY WITH PROPOFOL;  Surgeon: JRobert Bellow MD;  [facility];  Service: Endoscopy;  Laterality: N/A;  ? COLONOSCOPY WITH PROPOFOL N/A 08/10/2020  ? Procedure: COLONOSCOPY WITH PROPOFOL;  Surgeon: BRobert Bellow MD;  [facility];  Service: Endoscopy;  Laterality: N/A;  ? CORONARY STENT INTERVENTION N/A  06/28/2017  ? Procedure: CORONARY STENT INTERVENTION;  Surgeon: Yolonda Kida, MD;  Location: [place] [facility] LAB;  Service: Cardiovascular;  Laterality: N/A;  ? EYE SURGERY    ? lens eye surgery  ? HERNIA REPAIR  6222  ? umbilical  ? LEFT HEART CATH AND CORONARY ANGIOGRAPHY N/A 06/28/2017  ? Procedure: LEFT HEART CATH AND CORONARY ANGIOGRAPHY;  Surgeon: Minna Merritts, MD;  Location: [place] CV LAB;  Service: Cardiovascular;  Laterality: N/A;  ? LUMBAR LAMINECTOMY/DECOMPRESSION MICRODISCECTOMY Bilateral 03/10/2018  ? Procedure: Laminectomy and Foraminotomy - Lumbar three-Lumbar four - Lumbar four-Lumbar five - bilateral;  Surgeon: Earnie Larsson, MD;   Location: [place];  Service: Neurosurgery;  Laterality: Bilateral;  ? NEPHRECTOMY  1992  ? left- cancer  ? RIB RESECTION    ? removal 4 ribs  ? TONSILLECTOMY    ? ? ?Home Medications:  ?Allergies as of 02/26/2022   ? ?   Reactions  ? Morphine And Related Nausea Only  ? Prednisone Nausea And Vomiting  ? Amlodipine Palpitations  ? Latex Rash  ? ?  ? ?  ?Medication List  ?  ? ?  ? Accurate as of February 26, 2022 11:59 PM. If you have any questions, ask your nurse or doctor.  ?  ?  ? ?  ? ?allopurinol 100 MG tablet ?Commonly known as: ZYLOPRIM ?Take 2 tablets (200 mg total) by mouth daily. ?  ?aspirin EC 81 MG tablet ?Take 81 mg by mouth daily. Swallow whole. ?  ?atorvastatin 80 MG tablet ?Commonly known as: LIPITOR ?Take 1 tablet by mouth once daily ?  ?carvedilol 12.5 MG tablet ?Commonly known as: COREG ?Take 1 tablet (12.5 mg total) by mouth 2 (two) times daily. ?  ?colchicine 0.6 MG tablet ?TAKE 1 TABLET BY MOUTH THREE TIMES DAILY UNTIL STOMACH UPSET OR FLARE SUBSIDES. THEN FOR MAINTENANCE TAKE 1 TABLET ONCE DAILY ?  ?esomeprazole 40 MG capsule ?Commonly known as: [place] ?Take 40 mg by mouth daily in the afternoon. ?  ?gabapentin 100 MG capsule ?Commonly known as: NEURONTIN ?Take 100 mg by mouth 3 (three) times daily as needed. ?  ?isosorbide mononitrate 30 MG 24 hr tablet ?Commonly known as: IMDUR ?Take 1 tablet (30 mg total) by mouth in the morning and at bedtime. ?  ?lidocaine 5 % ?Commonly known as: LIDODERM ?Place 1 patch onto the skin daily. Remove & Discard patch within 12 hours or as directed by MD ?  ?multivitamin with minerals tablet ?Take 1 tablet by mouth daily. ?  ?nitroGLYCERIN 0.4 MG SL tablet ?Commonly known as: NITROSTAT ?Place 1 tablet (0.4 mg total) under the tongue every 5 (five) minutes x 3 doses as needed for chest pain. ?  ?traMADol 50 MG tablet ?Commonly known as: ULTRAM ?Take 50 mg by mouth daily. ?  ? ?  ? ? ?Allergies:  ?Allergies  ?Allergen Reactions  ? Morphine And Related Nausea Only  ?  Prednisone Nausea And Vomiting  ? Amlodipine Palpitations  ? Latex Rash  ? ? ?Family History: ?Family History  ?Problem Relation Age of Onset  ? Colon cancer Mother   ? Cerebral aneurysm Father   ? Heart attack Father   ? Heart attack Brother 21  ? Healthy Son   ? ? ?Social History:  reports that she has never smoked. She has never been exposed to tobacco smoke. She has never used smokeless tobacco. She reports that she does not drink alcohol and does not use drugs. ? ? ?Physical  Exam: ?BP (!) 172/80   Pulse 76   Ht '5\' 10"'$  (1.778 m)   Wt 267 lb (121.1 kg)   BMI 38.31 kg/m?   ?Constitutional:  Well nourished. Alert and oriented, No acute distress. ?HEENT: [place] AT, moist mucus membranes.  Trachea midline ?Cardiovascular: No clubbing, cyanosis, or edema. ?Respiratory: Normal respiratory effort, no increased work of breathing. ?Neurologic: Grossly intact, no focal deficits, moving all 4 extremities. ?Psychiatric: Normal mood and affect.   ? ?Laboratory Data: ?Component ?    Latest Ref Rng 01/16/2022  ?Uric Acid ?    3.1 - 7.9 mg/dL 5.9   ?I have reviewed the labs.  ? ?Pertinent Imaging: ?CLINICAL DATA:  Solitary kidney. ?  ?EXAM: ?RENAL / URINARY TRACT ULTRASOUND COMPLETE ?  ?COMPARISON:  None. ?  ?FINDINGS: ?Right Kidney: ?  ?Renal measurements: 14.0 cm x 6.0 cm x 7.7 cm = volume: 337 mL. ?Echogenicity within normal limits. A 4.3 cm x 4.2 cm x 4.2 cm ?anechoic structure is seen within the lower pole of the right ?kidney. This is seen on the prior study and is mildly increased in ?size when compared to the prior exam (measured approximately 3.7 cm ?x 3.5 cm x 4.2 cm on the prior study) no abnormal flow is noted ?within this region on color Doppler evaluation. No hydronephrosis is ?visualized. ?  ?Left Kidney: ?  ?The left kidney is surgically absent. ?  ?Bladder: ?  ?Appears normal for degree of bladder distention. ?  ?Other: ?  ?None. ?  ?IMPRESSION: ?1. Findings consistent with history of prior left nephrectomy. ?2.  Simple right renal cyst. ?  ?  ?Electronically Signed ?  By: Virgina Norfolk M.D. ?  On: 01/22/2022 21:30 ?I have independently reviewed the films.  See HPI.   ? ?Assessment & Plan:   ? ?Solitary kidney  ?- RUS

## 2022-02-26 ENCOUNTER — Ambulatory Visit (INDEPENDENT_AMBULATORY_CARE_PROVIDER_SITE_OTHER): Payer: Medicare Other | Admitting: Urology

## 2022-02-26 ENCOUNTER — Encounter: Payer: Self-pay | Admitting: Urology

## 2022-02-26 VITALS — BP 172/80 | HR 76 | Ht 70.0 in | Wt 267.0 lb

## 2022-02-26 DIAGNOSIS — N281 Cyst of kidney, acquired: Secondary | ICD-10-CM

## 2022-02-26 DIAGNOSIS — Z905 Acquired absence of kidney: Secondary | ICD-10-CM

## 2022-02-26 DIAGNOSIS — I2510 Atherosclerotic heart disease of native coronary artery without angina pectoris: Secondary | ICD-10-CM | POA: Diagnosis not present

## 2022-03-15 DIAGNOSIS — R350 Frequency of micturition: Secondary | ICD-10-CM | POA: Insufficient documentation

## 2022-03-19 ENCOUNTER — Telehealth: Payer: Self-pay | Admitting: Cardiovascular Disease

## 2022-03-19 NOTE — Telephone Encounter (Signed)
Called patient.  ? ?Advised her that per OV note from 3/28, she does not need to be taking Plavix, only ASA.  ? ?Pt verbalized understanding and voiced appreciation for the call.  ?

## 2022-03-19 NOTE — Telephone Encounter (Signed)
Pt c/o medication issue: ? ?1. Name of Medication: Palvix  ? ?2. How are you currently taking this medication (dosage and times per day)?   ? ?3. Are you having a reaction (difficulty breathing--STAT)? no ? ?4. What is your medication issue? Patient calling in make sure she is not supposed to be taking this medication anymore. Please advise ? ?

## 2022-03-23 ENCOUNTER — Ambulatory Visit: Payer: Self-pay

## 2022-03-27 ENCOUNTER — Telehealth: Payer: Self-pay | Admitting: Cardiovascular Disease

## 2022-03-27 DIAGNOSIS — I10 Essential (primary) hypertension: Secondary | ICD-10-CM

## 2022-03-27 MED ORDER — ISOSORBIDE MONONITRATE ER 30 MG PO TB24: 30.0000 mg | ORAL_TABLET | Freq: Two times a day (BID) | ORAL | 3 refills | Status: DC

## 2022-03-27 NOTE — Telephone Encounter (Signed)
Completed.

## 2022-03-27 NOTE — Telephone Encounter (Signed)
?*  STAT* If patient is at the pharmacy, call can be transferred to refill team. ? ? ?1. Which medications need to be refilled? (please list name of each medication and dose if known)  ?isosorbide mononitrate (IMDUR) 30 MG 24 hr tablet ? ?2. Which pharmacy/location (including street and city if local pharmacy) is medication to be sent to? ?[facility] [place] [address] [place] ? ?3. Do they need a 30 day or 90 day supply? 30 with refills ? ?Patient is out of medication  ?

## 2022-03-30 IMAGING — CT CT HEAD W/O CM
3 series · 16 of 47 positions shown, 19 images · non-contrast
Comparison: Prior head CT 01/09/2016

CLINICAL DATA: Hypertension, headache

EXAM:
CT HEAD WITHOUT CONTRAST
TECHNIQUE: Contiguous axial images were obtained from the base of the skull
through the vertex without intravenous contrast.

[Series 3: head wo · axial · 0.45mm/px · z∈[-165,-25]mm · 10 of 34 slices shown, 13 images]
[im 3/34  brain]
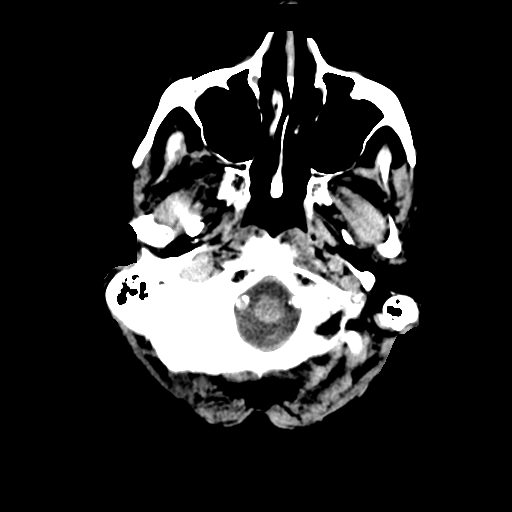
[im 3/34  bone]
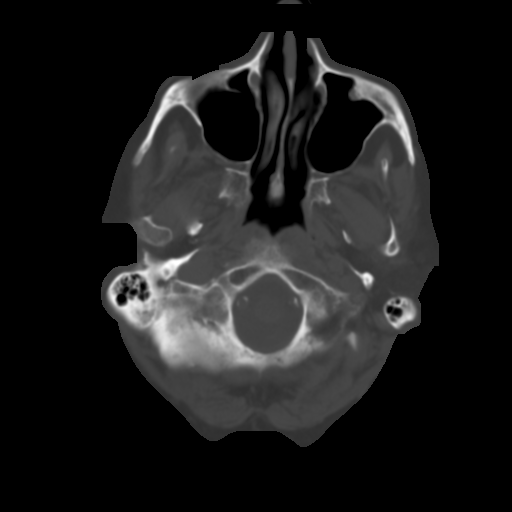
[im 6/34  brain]
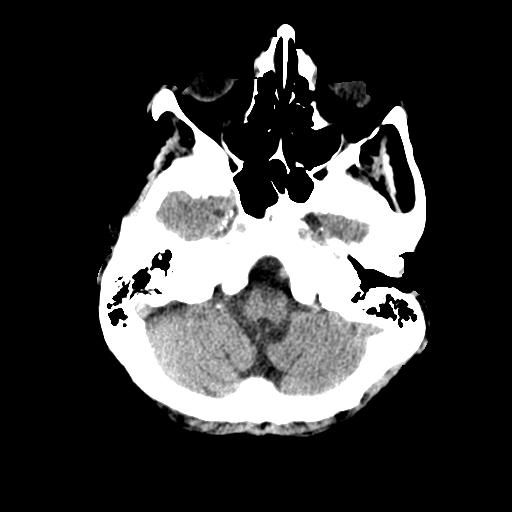
[im 10/34  brain]
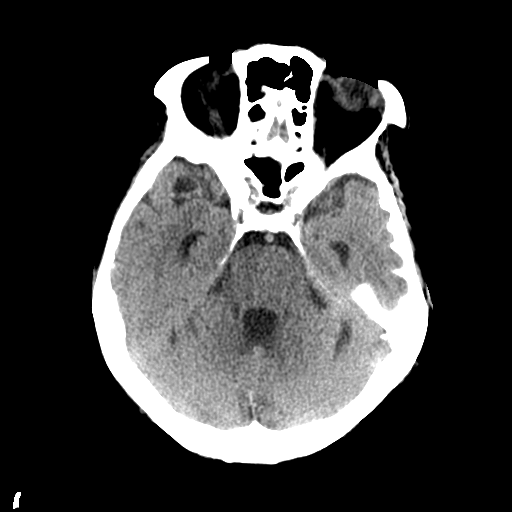
[im 12/34  brain]
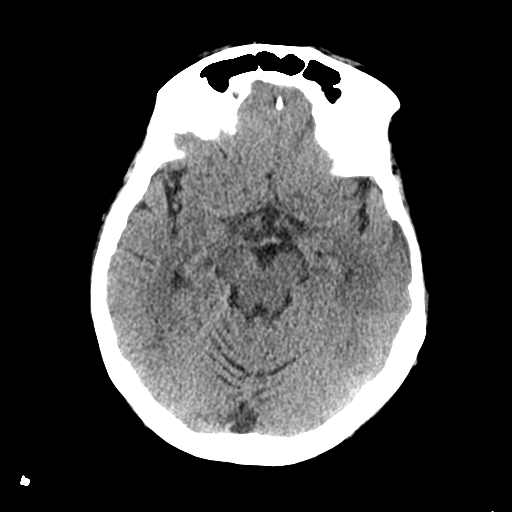
[im 15/34  brain]
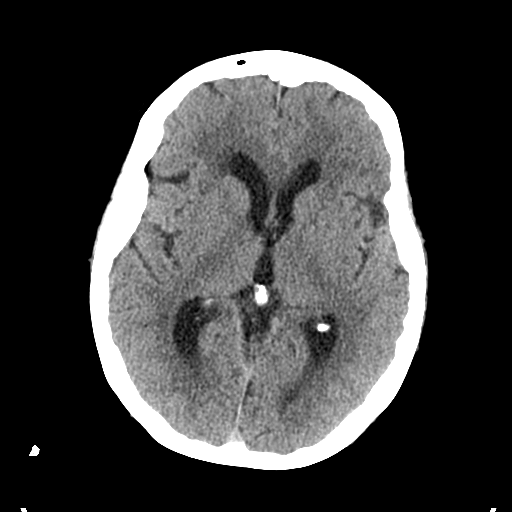
[im 15/34  bone]
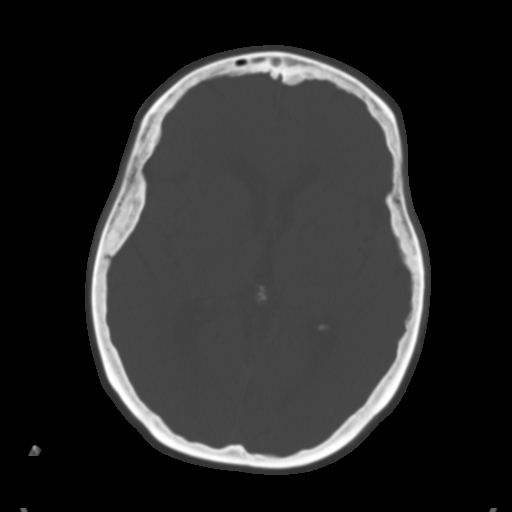
[im 19/34  brain]
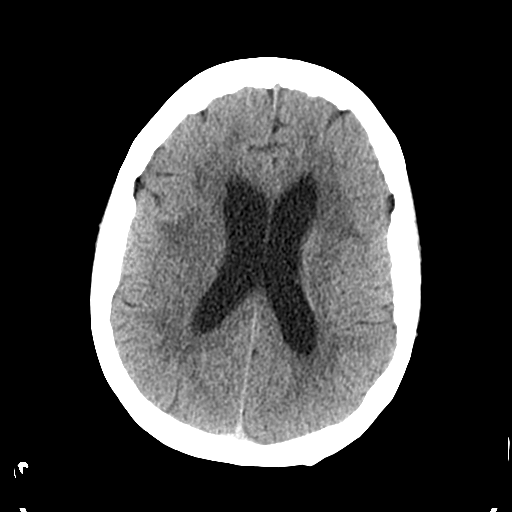
[im 22/34  brain]
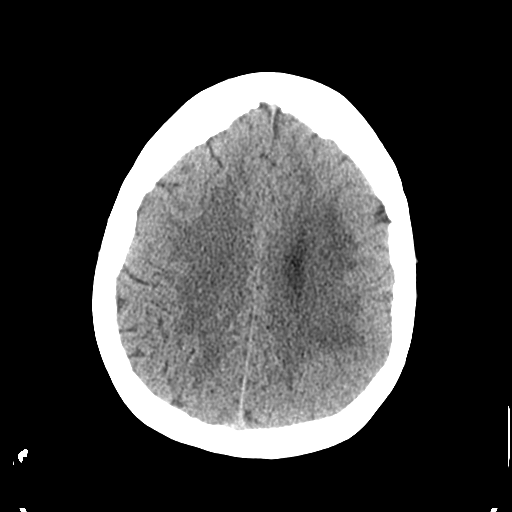
[im 26/34  brain]
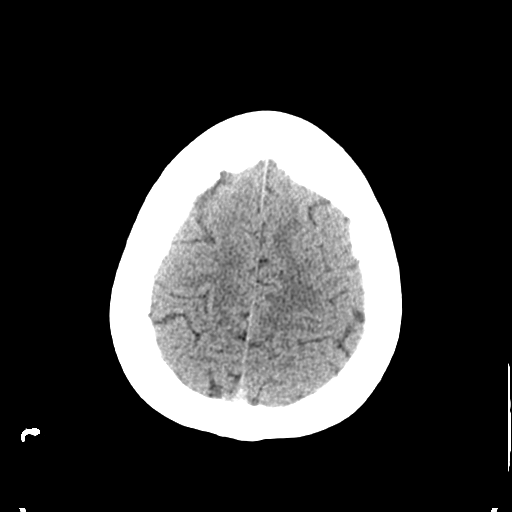
[im 28/34  brain]
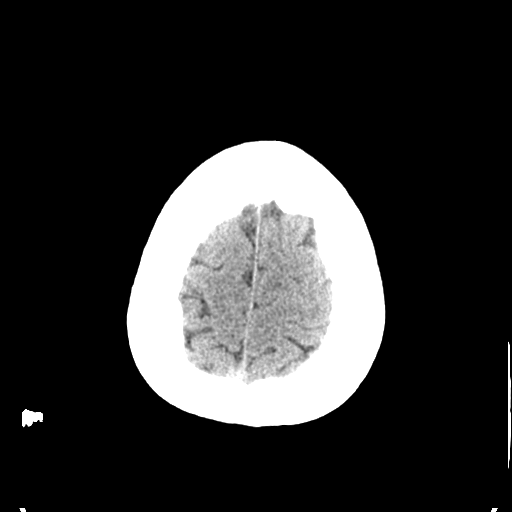
[im 28/34  bone]
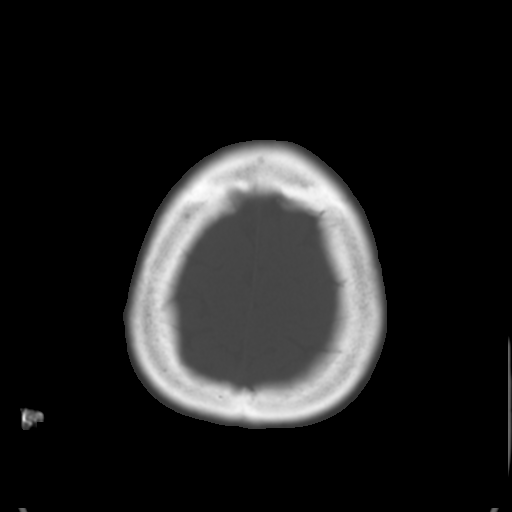
[im 31/34  brain]
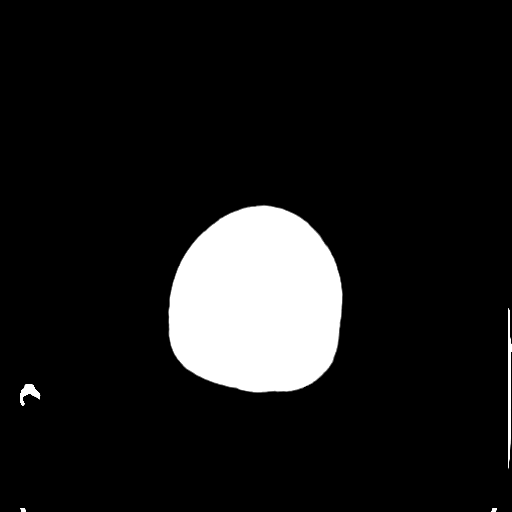

[Series 4: coronal soft tissue · coronal · 0.36mm/px · 3 of 74 slices shown]
[im 25/74  brain]
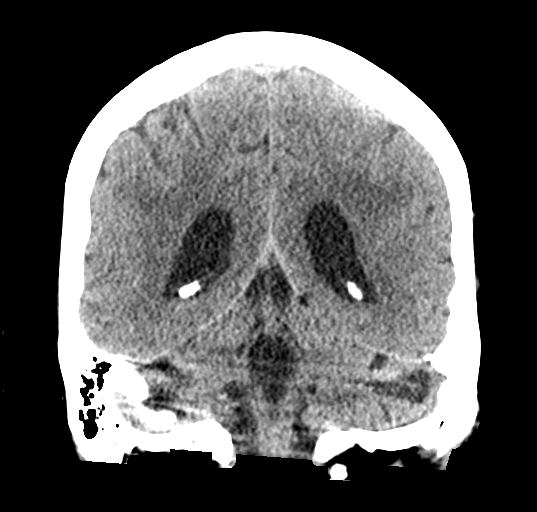
[im 33/74  brain]
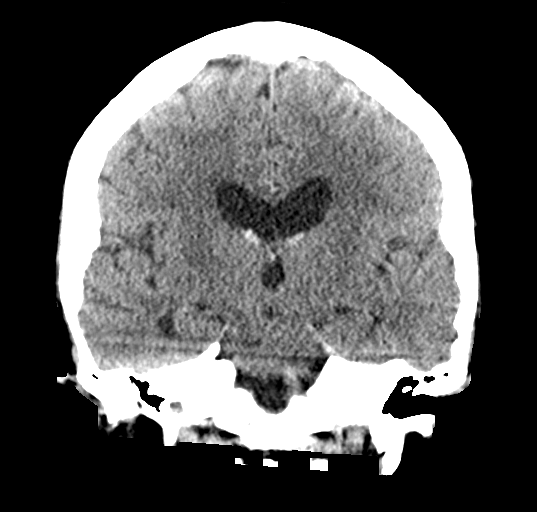
[im 41/74  brain]
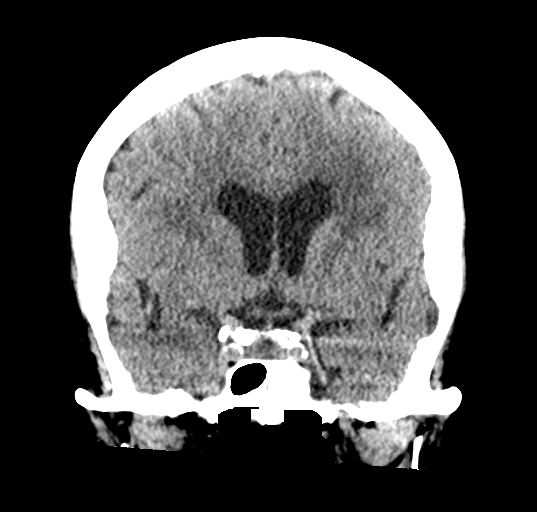

[Series 5: sagittal soft tissue · sagittal · 0.36mm/px · 3 of 57 slices shown]
[im 19/57  brain]
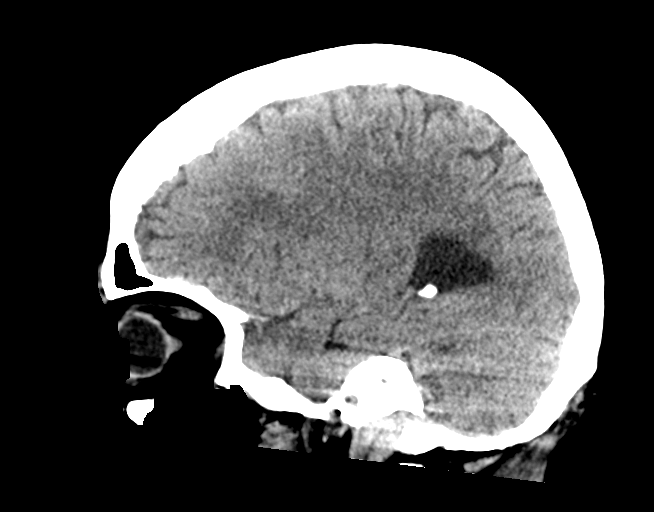
[im 29/57  brain]
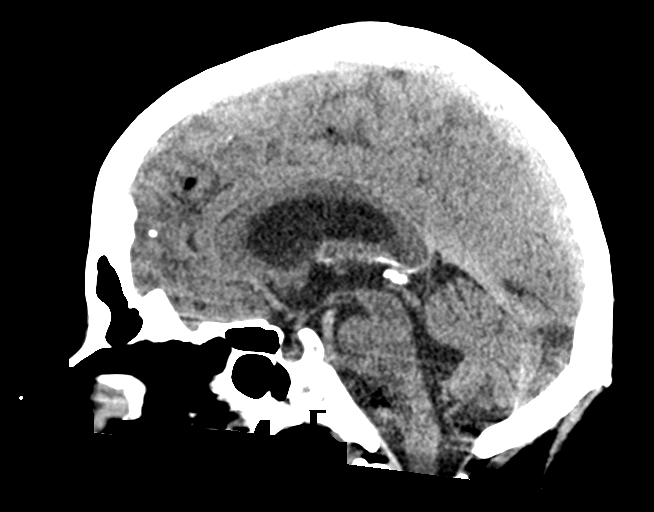
[im 38/57  brain]
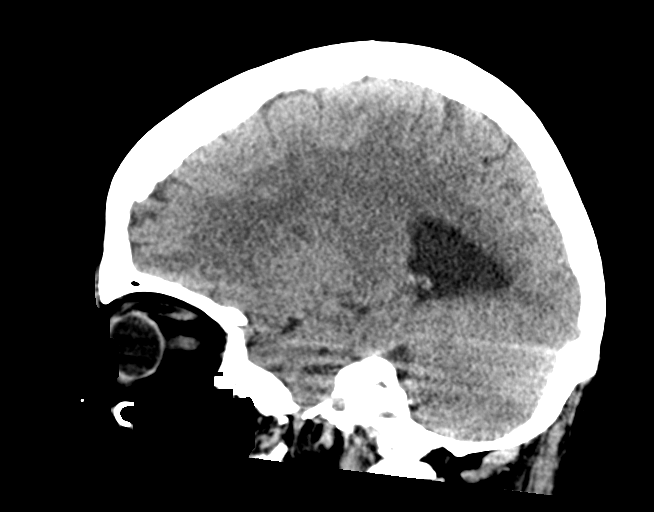

[16 of 47 positions shown; findings below may reference images not displayed]

FINDINGS: Brain: No evidence of acute infarction, hemorrhage, hydrocephalus,
extra-axial collection or mass lesion/mass effect.

Vascular: No hyperdense vessel or unexpected calcification.

Skull: Normal. Negative for fracture or focal lesion.

Sinuses/Orbits: No acute finding.

Other: None.
IMPRESSION: Negative head CT.

## 2022-04-01 DIAGNOSIS — S8992XA Unspecified injury of left lower leg, initial encounter: Secondary | ICD-10-CM | POA: Insufficient documentation

## 2022-04-24 DIAGNOSIS — W5501XA Bitten by cat, initial encounter: Secondary | ICD-10-CM | POA: Insufficient documentation

## 2022-05-03 ENCOUNTER — Other Ambulatory Visit: Payer: Self-pay | Admitting: Cardiovascular Disease

## 2022-05-03 DIAGNOSIS — I10 Essential (primary) hypertension: Secondary | ICD-10-CM

## 2022-05-03 NOTE — Telephone Encounter (Signed)
Please see note below. 

## 2022-05-03 NOTE — Telephone Encounter (Signed)
Please advise if it is ok to refill this prescription. The way the patient is taking this medication is different from what's on her chart. Thank you.

## 2022-05-03 NOTE — Telephone Encounter (Signed)
Patient returned call she said she takes 1 and 1/2 tablets twice a day.

## 2022-05-03 NOTE — Telephone Encounter (Addendum)
LVM requesting a call back to verify how the patient is taking the medication. Instructions from requested refill is different from what is in the  patient's chart.

## 2022-05-25 NOTE — Progress Notes (Signed)
Faxed request to PCP for notes, labs and recent EKG if any.

## 2022-05-25 NOTE — Progress Notes (Unsigned)
Cardiology Office Note  Date:  05/28/2022   ID:  Carrie Larsen, Carrie Larsen 01/27/1946, MRN [id number]  PCP:  Rutherford Limerick, PA   Chief Complaint  Patient presents with   Shortness of Breath    Patient c/o HTN, bilateral LE edema, cough, wheezing and shortness of breath. Medications reviewed by the patient verbally.     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, he is medicated Stays at a "[employer]", lots of stairs No  regular exercise program Sedentary, weight continues to trend upwards Previously did PT with him  Weight 265 pounds on last clinic visit now up to 23 Has arthritis pain  Denies chest pain or shortness of breath concerning for angina  No recent flares up of her gout Chronic back pain  EKG personally reviewed by myself on todays visit Nsr rate 60 bpm consider old inferior MI  Other past medical history reviewed Seen in the hospital September 2020 for chest pain Cardiac enzymes negative EKG unchanged Felt to be most likely musculoskeletal  Hospital admission August 2018 for non-STEMI Troponin up to 0.88 CT scan with no PE Cardiac catheterization with PCI of OM vessel There is occluded proximal to mid RCA with collaterals from left to right, appeared chronic  Echocardiogram with ejection fraction 55-65%   She reports having an episode of left-sided numbness on her face 11/14/2013. Workup was negative. Including cardiac enzymes, basic  metabolic panels, CBC, chest x-ray and urinalysis     PMH:   has a past medical history of Anxiety, Arthritis, CAD (coronary artery disease), Cancer ([place]), Chicken pox, CME (cystoid macular edema), left (11/20/2018), Colon cancer ([place]) (1992), Colon polyp (2011), COVID-19 (07/2329), Diastolic dysfunction, Essential hypertension, GERD (gastroesophageal reflux disease), Gout, History of appendectomy (06/07/2015), History of kidney cancer (11/20/2018), History of kidney stones, Hyperlipidemia, Lumbar spinal stenosis, Myocardial infarction ([place]), Nonexudative age-related macular degeneration, bilateral, early dry stage (11/20/2018), Obesity, Prediabetes, Pseudophakia of both eyes, and Retinal cyst.  PSH:    Past Surgical History:  Procedure Laterality Date   ABDOMINAL HYSTERECTOMY     APPENDECTOMY  06/07/2015   CARDIAC CATHETERIZATION     CATARACT EXTRACTION W/PHACO Left 04/24/2016   Procedure: CATARACT EXTRACTION PHACO AND INTRAOCULAR LENS PLACEMENT (IOC);  Surgeon: Birder Robson, MD;  Location: [facility] ORS;  Service: Ophthalmology;  Laterality: Left;  [place] 45.8 AP% 16.4 CDE 7.53 FLUID PACK LOT # [id number]   CATARACT EXTRACTION W/PHACO Right 06/05/2016   Procedure: CATARACT EXTRACTION PHACO AND INTRAOCULAR LENS PLACEMENT (IOC);  Surgeon: Birder Robson, MD;  Location: [facility];  Service: Ophthalmology;  Laterality: Right;  [place] 25.9 AP% 21.9 CDE 5.68 Fluid pack lot # [id number]   COLON RESECTION  03/04/1991   COLON SURGERY     COLONOSCOPY  03-14-10   Dr Bary Castilla, tubular adenoma at 25 cm.   COLONOSCOPY WITH PROPOFOL N/A 06/07/2015   Procedure: COLONOSCOPY WITH PROPOFOL;  Surgeon: Robert Bellow, MD;  Location: [facility];  Service: Endoscopy;  Laterality: N/A;  COLONOSCOPY WITH PROPOFOL N/A 08/10/2020   Procedure: COLONOSCOPY WITH PROPOFOL;  Surgeon: Robert Bellow, MD;  Location: [facility] ENDOSCOPY;  Service: Endoscopy;  Laterality: N/A;   CORONARY STENT INTERVENTION N/A 06/28/2017    Procedure: CORONARY STENT INTERVENTION;  Surgeon: Yolonda Kida, MD;  Location: [place] [facility] LAB;  Service: Cardiovascular;  Laterality: N/A;   EYE SURGERY     lens eye surgery   HERNIA REPAIR  6160   umbilical   LEFT HEART CATH AND CORONARY ANGIOGRAPHY N/A 06/28/2017   Procedure: LEFT HEART CATH AND CORONARY ANGIOGRAPHY;  Surgeon: Minna Merritts, MD;  Location: [place] [facility] LAB;  Service: Cardiovascular;  Laterality: N/A;   LUMBAR LAMINECTOMY/DECOMPRESSION MICRODISCECTOMY Bilateral 03/10/2018   Procedure: Laminectomy and Foraminotomy - Lumbar three-Lumbar four - Lumbar four-Lumbar five - bilateral;  Surgeon: Earnie Larsson, MD;  Location: [place];  Service: Neurosurgery;  Laterality: Bilateral;   NEPHRECTOMY  1992   left- cancer   RIB RESECTION     removal 4 ribs   TONSILLECTOMY      Current Outpatient Medications  Medication Sig Dispense Refill   allopurinol (ZYLOPRIM) 100 MG tablet Take 2 tablets (200 mg total) by mouth daily. 60 tablet 2   aspirin EC 81 MG tablet Take 81 mg by mouth daily. Swallow whole.     atorvastatin (LIPITOR) 80 MG tablet Take 1 tablet by mouth once daily 90 tablet 3   carvedilol (COREG) 12.5 MG tablet Take 1 tablet (12.5 mg total) by mouth 2 (two) times daily with a meal. 180 tablet 1   colchicine 0.6 MG tablet TAKE 1 TABLET BY MOUTH THREE TIMES DAILY UNTIL STOMACH UPSET OR FLARE SUBSIDES. THEN FOR MAINTENANCE TAKE 1 TABLET ONCE DAILY 90 tablet 0   esomeprazole (NEXIUM) 40 MG capsule Take 40 mg by mouth daily in the afternoon.     gabapentin (NEURONTIN) 100 MG capsule Take 100 mg by mouth 3 (three) times daily as needed.     isosorbide mononitrate (IMDUR) 30 MG 24 hr tablet Take 1 tablet (30 mg total) by mouth in the morning and at bedtime. 60 tablet 3   lidocaine (LIDODERM) 5 % Place 1 patch onto the skin daily. Remove & Discard patch within 12 hours or as directed by MD     Multiple Vitamins-Minerals (MULTIVITAMIN WITH MINERALS) tablet Take 1 tablet by  mouth daily.     nitroGLYCERIN (NITROSTAT) 0.4 MG SL tablet Place 1 tablet (0.4 mg total) under the tongue every 5 (five) minutes x 3 doses as needed for chest pain. 25 tablet 1   traMADol (ULTRAM) 50 MG tablet Take 50 mg by mouth daily.      Current Facility-Administered Medications  Medication Dose Route Frequency Provider Last Rate Last Admin   betamethasone acetate-betamethasone sodium phosphate (CELESTONE) injection 3 mg  3 mg Intra-articular Once Evans, Brent M, DPM       dexamethasone (DECADRON) injection 4 mg  4 mg Other Once Criselda Peaches, DPM       triamcinolone acetonide (KENALOG) 10 MG/ML injection 10 mg  10 mg Other Once Criselda Peaches, DPM        Allergies:   Morphine and related, Prednisone, Amlodipine, and Latex   Social History:  The patient  reports that she has never smoked. She has never been exposed to tobacco smoke. She has never used smokeless tobacco. She reports that she does not drink alcohol and does not use drugs.   Family History:   family history includes  Cerebral aneurysm in her father; Colon cancer in her mother; Healthy in her son; Heart attack in her father; Heart attack (age of onset: 62) in her brother.    Review of Systems: Review of Systems  Constitutional: Negative.   Respiratory: Negative.    Cardiovascular: Negative.   Gastrointestinal: Negative.   Musculoskeletal:  Positive for back pain.  Neurological: Negative.   Psychiatric/Behavioral: Negative.    All other systems reviewed and are negative.  PHYSICAL EXAM: VS:  BP (!) 160/90 (BP Location: Left Arm, Patient Position: Sitting, Cuff Size: Normal)   Pulse 60   Ht '5\' 10"'$  (1.778 m)   Wt 271 lb 2 oz (123 kg)   SpO2 98%   BMI 38.90 kg/m  , BMI Body mass index is 38.9 kg/m.  Constitutional:  oriented to person, place, and time. No distress.  Gait instability HENT:  Head: Grossly normal Eyes:  no discharge. No scleral icterus.  Neck: No JVD, no carotid bruits  Cardiovascular:  Regular rate and rhythm, no murmurs appreciated Pulmonary/Chest: Clear to auscultation bilaterally, no wheezes or rails Abdominal: Soft.  no distension.  no tenderness.  Musculoskeletal: Normal range of motion Neurological:  normal muscle tone. Coordination normal. No atrophy Skin: Skin warm and dry Psychiatric: normal affect, pleasant  Recent Labs: 08/27/2021: BUN 17; Creatinine, Ser 0.99; Hemoglobin 15.2; Platelets 242; Potassium 3.9; Sodium 139    Lipid Panel Lab Results  Component Value Date   CHOL 172 06/25/2019   HDL 45 06/25/2019   LDLCALC 100 (H) 06/25/2019   TRIG 135 06/25/2019      Wt Readings from Last 3 Encounters:  05/28/22 271 lb 2 oz (123 kg)  02/26/22 267 lb (121.1 kg)  02/20/22 265 lb 4 oz (120.3 kg)     ASSESSMENT AND PLAN:  Non-STEMI (non-ST elevated myocardial infarction) ([place]) Prior intervention 2018 on aspirin 81 mg daily, off plavix Cholesterol above goal, she will work on her weight, stay on statin Denies anginal symptoms  Mixed hyperlipidemia She is currently on Lipitor 80 Unable to tolerate Zetia, cause diarrhea Will request new labs, she prefers to have it done with PMD Could consider PCSK9 inhibitor  Essential hypertension BP elevated, check at home and call us with numbers Start lasix 20 milligram 1-2 x a week Will need periodic BMP check  Morbid obesity We have encouraged continued exercise, careful diet management in an effort to lose weight.  Shortness of breath Stable symptoms Weight continues to trend upwards, deconditioned, walking with a walker Lasix as needed   Total encounter time more than 30 minutes  Greater than 50% was spent in counseling and coordination of care with the patient    No orders of the defined types were placed in this encounter.    Signed, Esmond Plants, M.D., Ph.D. 05/28/2022  [place], [place]

## 2022-05-28 ENCOUNTER — Other Ambulatory Visit: Payer: Self-pay | Admitting: Orthopedic Surgery

## 2022-05-28 ENCOUNTER — Ambulatory Visit (INDEPENDENT_AMBULATORY_CARE_PROVIDER_SITE_OTHER): Payer: Medicare Other | Admitting: Cardiovascular Disease

## 2022-05-28 ENCOUNTER — Encounter: Payer: Self-pay | Admitting: Cardiovascular Disease

## 2022-05-28 VITALS — BP 160/90 | HR 60 | Ht 70.0 in | Wt 271.1 lb

## 2022-05-28 DIAGNOSIS — R002 Palpitations: Secondary | ICD-10-CM

## 2022-05-28 DIAGNOSIS — I10 Essential (primary) hypertension: Secondary | ICD-10-CM | POA: Diagnosis not present

## 2022-05-28 DIAGNOSIS — I2510 Atherosclerotic heart disease of native coronary artery without angina pectoris: Secondary | ICD-10-CM

## 2022-05-28 DIAGNOSIS — E785 Hyperlipidemia, unspecified: Secondary | ICD-10-CM | POA: Diagnosis not present

## 2022-05-28 DIAGNOSIS — I25118 Atherosclerotic heart disease of native coronary artery with other forms of angina pectoris: Secondary | ICD-10-CM | POA: Diagnosis not present

## 2022-05-28 DIAGNOSIS — M79662 Pain in left lower leg: Secondary | ICD-10-CM

## 2022-05-28 MED ORDER — POTASSIUM CHLORIDE ER 10 MEQ PO TBCR: 10.0000 meq | EXTENDED_RELEASE_TABLET | Freq: Every day | ORAL | 1 refills | Status: DC | PRN

## 2022-05-28 MED ORDER — FUROSEMIDE 20 MG PO TABS: 20.0000 mg | ORAL_TABLET | Freq: Every day | ORAL | 1 refills | Status: DC | PRN

## 2022-05-28 NOTE — Patient Instructions (Addendum)
Medication Instructions:  Lasix 20 mg daily as needed with potassium 10 meq daily as needed,take sparingly 1-2 a week  If you need a refill on your cardiac medications before your next appointment, please call your pharmacy.   Check your blood pressure at home (approximately 2 hours after BP medications)  Call our office or send MyChart message with BP numbers   Lab work: No new labs needed  We will request labs from your PMD   Testing/Procedures: No new testing needed  Follow-Up: At [facility], you and your health needs are our priority.  As part of our continuing mission to provide you with exceptional heart care, we have created designated Provider Care Teams.  These Care Teams include your primary Cardiologist (physician) and Advanced Practice Providers (APPs -  Physician Assistants and Nurse Practitioners) who all work together to provide you with the care you need, when you need it.  You will need a follow up appointment in 6 months  Providers on your designated Care Team:   Murray Hodgkins, NP Christell Faith, PA-C Cadence Kathlen Mody, [place]  COVID-19 Vaccine Information can be found at: [email or web address] For questions related to vaccine distribution or appointments, please email [email or web address] or call [phone].

## 2022-05-30 DIAGNOSIS — M79605 Pain in left leg: Secondary | ICD-10-CM | POA: Insufficient documentation

## 2022-06-05 ENCOUNTER — Ambulatory Visit
Admission: RE | Admit: 2022-06-05 | Discharge: 2022-06-05 | Disposition: A | Discharge status: Home / Self Care | Payer: Medicare Other | Source: Ambulatory Visit | Attending: Orthopedic Surgery | Admitting: Orthopedic Surgery

## 2022-06-05 DIAGNOSIS — M79662 Pain in left lower leg: Secondary | ICD-10-CM | POA: Insufficient documentation

## 2022-06-07 ENCOUNTER — Ambulatory Visit: Payer: Medicare Other

## 2022-06-21 ENCOUNTER — Other Ambulatory Visit: Payer: Self-pay | Admitting: Podiatry

## 2022-06-21 ENCOUNTER — Ambulatory Visit: Payer: Medicare Other | Admitting: Podiatry

## 2022-06-21 ENCOUNTER — Ambulatory Visit (INDEPENDENT_AMBULATORY_CARE_PROVIDER_SITE_OTHER): Payer: Medicare Other | Admitting: Podiatry

## 2022-06-21 DIAGNOSIS — M7752 Other enthesopathy of left foot: Secondary | ICD-10-CM | POA: Diagnosis not present

## 2022-06-21 DIAGNOSIS — M7751 Other enthesopathy of right foot: Secondary | ICD-10-CM

## 2022-06-21 DIAGNOSIS — M7750 Other enthesopathy of unspecified foot: Secondary | ICD-10-CM

## 2022-06-21 MED ORDER — COLCHICINE 0.6 MG PO TABS: 0.6000 mg | ORAL_TABLET | Freq: Every day | ORAL | 0 refills | Status: DC

## 2022-06-21 NOTE — Telephone Encounter (Signed)
Please advise 

## 2022-06-21 NOTE — Progress Notes (Signed)
HPI: 76 y.o. female presenting today for an acute gout flareup.  Patient states that she is having gout to the bilateral great toe joints.  She states the pain started again.  Now the right side is worse than left side.  She is ready for injection.  She has not had an injection in 6 months..  Past Medical History:  Diagnosis Date   Anxiety    Arthritis    osteoarthritis   CAD (coronary artery disease)    a. 2012 ETT: no ischemia;  b. 06/2017 NSTEMI/PCI: LM nl, LAD nl, D1 70-80p, LCX nl, OM1 90 (3.0 x 23 Xience Alpine), RCA dominant, 100p/m, fills via L->R collats, EF 55-65%.   Cancer [facility])    a. s/p partial left nephrectomy.   Chicken pox    CME (cystoid macular edema), left 11/20/2018   Colon cancer ([place]) 1992   T3, N1, M0. colon    Colon polyp 2011   COVID-19 67/2094   Diastolic dysfunction    a. 08/2015 Echo: EF 60-65%, no rwma, Gr1 DD, midlly dil LA, PASP 46mHg. No significant valvular dzs; b. 06/2017 Echo: EF 60-65%, Gr1DD, mildly dil LA.   Essential hypertension    GERD (gastroesophageal reflux disease)    Gout    History of appendectomy 06/07/2015   History of kidney cancer 11/20/2018   History of kidney stones    passed - 2   Hyperlipidemia    Lumbar spinal stenosis    Myocardial infarction ([place]    06/2017   Nonexudative age-related macular degeneration, bilateral, early dry stage 11/20/2018   Obesity    Prediabetes    Pseudophakia of both eyes    Retinal cyst     Past Surgical History:  Procedure Laterality Date   ABDOMINAL HYSTERECTOMY     APPENDECTOMY  06/07/2015   CARDIAC CATHETERIZATION     CATARACT EXTRACTION W/PHACO Left 04/24/2016   Procedure: CATARACT EXTRACTION PHACO AND INTRAOCULAR LENS PLACEMENT [place]  Surgeon: WBirder Robson MD;  Location: [facility] ORS;  Service: Ophthalmology;  Laterality: Left;  [place]45.[place] AP% 16.4 CDE 7.53 FLUID PACK LOT # [id number]   CATARACT EXTRACTION W/PHACO Right 06/05/2016   Procedure: CATARACT EXTRACTION PHACO AND  INTRAOCULAR LENS PLACEMENT (I[facility]);  Surgeon: WBirder Robson MD;  Location: [facility] ORS;  Service: Ophthalmology;  Laterality: Right;  [place]25.9 AP% 21.9 CDE 5.68 Fluid pack lot # [id number]   COLON RESECTION  03/04/1991   COLON SURGERY     COLONOSCOPY  03-14-10   Dr BBary Castilla tubular adenoma at 25 cm.   COLONOSCOPY WITH PROPOFOL N/A 06/07/2015   Procedure: COLONOSCOPY WITH PROPOFOL;  Surgeon: JRobert Bellow MD;  [facility];  Service: Endoscopy;  Laterality: N/A;   COLONOSCOPY WITH PROPOFOL N/A 08/10/2020   Procedure: COLONOSCOPY WITH PROPOFOL;  Surgeon: BRobert Bellow MD;  Location: [facility] ENDOSCOPY;  Service: Endoscopy;  Laterality: N/A;   CORONARY STENT INTERVENTION N/A 06/28/2017   Procedure: CORONARY STENT INTERVENTION;  Surgeon: CYolonda Kida MD;  [place];  Service: Cardiovascular;  Laterality: N/A;   EYE SURGERY     lens eye surgery   HERNIA REPAIR  [id number]  umbilical   LEFT HEART CATH AND CORONARY ANGIOGRAPHY N/A 06/28/2017   Procedure: LEFT HEART CATH AND CORONARY ANGIOGRAPHY;  Surgeon: GMinna Merritts MD;  [facility];  Service: Cardiovascular;  Laterality: N/A;   LUMBAR LAMINECTOMY/DECOMPRESSION MICRODISCECTOMY Bilateral 03/10/2018   Procedure: Laminectomy and Foraminotomy - Lumbar three-Lumbar four - Lumbar  four-Lumbar five - bilateral;  Surgeon: Earnie Larsson, MD;  Location: [place];  Service: Neurosurgery;  Laterality: Bilateral;   NEPHRECTOMY  1992   left- cancer   RIB RESECTION     removal 4 ribs   TONSILLECTOMY      Allergies  Allergen Reactions   Morphine And Related Nausea Only   Prednisone Nausea And Vomiting   Amlodipine Palpitations   Latex Rash     Physical Exam: General: The patient is alert and oriented x3 in no acute distress.  Dermatology: Skin is warm, dry and supple bilateral lower extremities. Negative for open lesions or macerations.  Vascular: Palpable pedal pulses bilaterally. Capillary  refill within normal limits.  Erythema with edema noted to the first MTP joint bilateral  Neurological: Light touch and protective threshold grossly intact  Musculoskeletal Exam: No pedal deformities noted.  Significant tenderness to light touch and palpation of the first MTP joint bilateral  Assessment: 1.  Acute inflammatory gout/capsulitis first MTP bilateral   Plan of Care:  1. Patient evaluated. X-Rays reviewed.  2.  A steroid injection was performed at bilateral first MTP using 1% plain Lidocaine and 10 mg of Kenalog. This was well tolerated. 3.  Continue colchicine and allopurinol as needed 4.  I will see her back after comes back.

## 2022-06-27 ENCOUNTER — Other Ambulatory Visit: Payer: Self-pay

## 2022-06-27 DIAGNOSIS — I10 Essential (primary) hypertension: Secondary | ICD-10-CM

## 2022-06-27 MED ORDER — ISOSORBIDE MONONITRATE ER 30 MG PO TB24: 30.0000 mg | ORAL_TABLET | Freq: Two times a day (BID) | ORAL | 3 refills | Status: DC

## 2022-07-02 ENCOUNTER — Other Ambulatory Visit: Payer: Self-pay

## 2022-07-02 DIAGNOSIS — I10 Essential (primary) hypertension: Secondary | ICD-10-CM

## 2022-07-02 MED ORDER — ISOSORBIDE MONONITRATE ER 30 MG PO TB24: 30.0000 mg | ORAL_TABLET | Freq: Two times a day (BID) | ORAL | 3 refills | Status: DC

## 2022-07-10 ENCOUNTER — Ambulatory Visit: Payer: Medicare Other | Admitting: Internal Medicine

## 2022-07-16 ENCOUNTER — Ambulatory Visit: Payer: Medicare Other | Admitting: Internal Medicine

## 2022-07-26 ENCOUNTER — Ambulatory Visit (INDEPENDENT_AMBULATORY_CARE_PROVIDER_SITE_OTHER): Payer: Medicare Other | Admitting: Nurse Practitioner

## 2022-07-26 ENCOUNTER — Encounter: Payer: Self-pay | Admitting: Nurse Practitioner

## 2022-07-26 VITALS — BP 172/95 | HR 70 | Ht 70.0 in | Wt 268.4 lb

## 2022-07-26 DIAGNOSIS — I10 Essential (primary) hypertension: Secondary | ICD-10-CM | POA: Diagnosis not present

## 2022-07-26 DIAGNOSIS — I25118 Atherosclerotic heart disease of native coronary artery with other forms of angina pectoris: Secondary | ICD-10-CM | POA: Diagnosis not present

## 2022-07-26 DIAGNOSIS — Z6838 Body mass index (BMI) 38.0-38.9, adult: Secondary | ICD-10-CM

## 2022-07-26 DIAGNOSIS — M79662 Pain in left lower leg: Secondary | ICD-10-CM | POA: Insufficient documentation

## 2022-07-26 DIAGNOSIS — I2510 Atherosclerotic heart disease of native coronary artery without angina pectoris: Secondary | ICD-10-CM

## 2022-07-26 MED ORDER — LOSARTAN POTASSIUM-HCTZ 50-12.5 MG PO TABS: ORAL_TABLET | ORAL | 1 refills | Status: DC

## 2022-07-26 NOTE — Progress Notes (Addendum)
New Patient Office Visit  Subjective    Patient ID: Carrie Larsen, female    DOB: 01/16/1946  Age: 76 y.o. MRN: [id number]  CC: No chief complaint on file.   HPI Carrie Larsen presents to establish care. Her previous PCP was Dr. Mare Loan  at [facility]. He has h/o hypertension, hyperlipidemia,CAD.,  Colon cancer, left nephrectomy and arthritic pains.  [name]  Her symptoms started after having a fall 3 or 4 months ago.  Patient was prescribed gabapentin for the burning sensation but she is not taking at present.   Outpatient Encounter Medications as of 07/26/2022  Medication Sig   furosemide (LASIX) 20 MG tablet Take 1 tablet (20 mg total) by mouth daily as needed.   losartan-hydrochlorothiazide (HYZAAR) 50-12.5 MG tablet Take 1/2 tablet once a day.   aspirin EC 81 MG tablet Take 81 mg by mouth daily. Swallow whole.   atorvastatin (LIPITOR) 80 MG tablet Take 1 tablet by mouth once daily   carvedilol (COREG) 12.5 MG tablet Take 1 tablet (12.5 mg total) by mouth 2 (two) times daily with a meal.   colchicine 0.6 MG tablet TAKE 1 TABLET BY MOUTH THREE TIMES DAILY UNTIL STOMACH UPSET OR FLARES SUBSIDES. THEN FOR MAINTENANCE TAKE 1 TABLET ONCE DAILY   esomeprazole (NEXIUM) 40 MG capsule Take 40 mg by mouth daily in the afternoon.   gabapentin (NEURONTIN) 100 MG capsule Take 100 mg by mouth 3 (three) times daily as needed.   isosorbide mononitrate (IMDUR) 30 MG 24 hr tablet Take 1 tablet (30 mg total) by mouth in the morning and at bedtime.   lidocaine (LIDODERM) 5 % Place 1 patch onto the skin daily. Remove & Discard patch within 12 hours or as directed by MD   Multiple Vitamins-Minerals (MULTIVITAMIN WITH MINERALS) tablet Take 1 tablet by mouth daily.   nitroGLYCERIN (NITROSTAT) 0.4 MG SL tablet Place 1 tablet (0.4 mg total) under the tongue every 5 (five) minutes x 3 doses as needed for chest pain.    potassium chloride (KLOR-CON) 10 MEQ tablet Take 1 tablet (10 mEq total) by mouth daily as needed (when taking furosemide).   traMADol (ULTRAM) 50 MG tablet Take 50 mg by mouth daily.    [DISCONTINUED] allopurinol (ZYLOPRIM) 100 MG tablet Take 2 tablets (200 mg total) by mouth daily.   [DISCONTINUED] colchicine 0.6 MG tablet Take 1 tablet (0.6 mg total) by mouth daily.   Facility-Administered Encounter Medications as of 07/26/2022  Medication   betamethasone acetate-betamethasone sodium phosphate (CELESTONE) injection 3 mg   dexamethasone (DECADRON) injection 4 mg   triamcinolone acetonide (KENALOG) 10 MG/ML injection 10 mg    Past Medical History:  Diagnosis Date   Anxiety    Arthritis    osteoarthritis   CAD (coronary artery disease)    a. 2012 ETT: no ischemia;  b. 06/2017 NSTEMI/PCI: LM nl, LAD nl, D1 70-80p, LCX nl, OM1 90 (3.0 x 23 Xience [place]), RCA dominant, 100p/m, fills via L->R collats, EF 55-65%.   Cancer [facility])    a. s/p partial left nephrectomy.   Chicken pox    CME (cystoid macular edema), left 11/20/2018   Colon cancer ([place]) 1992   T3, N1, M0. colon    Colon polyp 2011   COVID-19 74/0814   Diastolic dysfunction    a. 08/2015 Echo: EF 60-65%, no rwma, Gr1 DD, midlly dil LA, PASP 63mHg. No significant valvular dzs; b. 06/2017  Echo: EF 60-65%, Gr1DD, mildly dil LA.   Essential hypertension    GERD (gastroesophageal reflux disease)    Gout    History of appendectomy 06/07/2015   History of kidney cancer 11/20/2018   History of kidney stones    passed - 2   Hyperlipidemia    Lumbar spinal stenosis    Myocardial infarction ([place])    06/2017   Nonexudative age-related macular degeneration, bilateral, early dry stage 11/20/2018   Obesity    Prediabetes    Pseudophakia of both eyes    Retinal cyst     Past Surgical History:  Procedure Laterality Date   ABDOMINAL HYSTERECTOMY     APPENDECTOMY  06/07/2015   CARDIAC CATHETERIZATION     CATARACT EXTRACTION W/PHACO  Left 04/24/2016   Procedure: CATARACT EXTRACTION PHACO AND INTRAOCULAR LENS PLACEMENT ([place]);  Surgeon: Birder Robson, MD;  Location: [facility];  Service: Ophthalmology;  Laterality: Left;  [place] 45.8 AP% 16.4 CDE 7.53 FLUID PACK LOT # [id number]   CATARACT EXTRACTION W/PHACO Right 06/05/2016   Procedure: CATARACT EXTRACTION PHACO AND INTRAOCULAR LENS PLACEMENT ([facility]);  Surgeon: Birder Robson, MD;  Location: [facility];  Service: Ophthalmology;  Laterality: Right;  [place] 25.9 AP% 21.9 CDE 5.68 Fluid pack lot # [id number]   COLON RESECTION  03/04/1991   COLON SURGERY     COLONOSCOPY  03-14-10   Dr Bary Castilla, tubular adenoma at 25 cm.   COLONOSCOPY WITH PROPOFOL N/A 06/07/2015   Procedure: COLONOSCOPY WITH PROPOFOL;  Surgeon: Robert Bellow, MD;  [facility];  Service: Endoscopy;  Laterality: N/A;   COLONOSCOPY WITH PROPOFOL N/A 08/10/2020   Procedure: COLONOSCOPY WITH PROPOFOL;  Surgeon: Robert Bellow, MD;  Location: [facility] ENDOSCOPY;  Service: Endoscopy;  Laterality: N/A;   CORONARY STENT INTERVENTION N/A 06/28/2017   Procedure: CORONARY STENT INTERVENTION;  Surgeon: Yolonda Kida, MD;  Location: [place] CV LAB;  Service: Cardiovascular;  Laterality: N/A;   EYE SURGERY     lens eye surgery   HERNIA REPAIR  [id number]   umbilical   LEFT HEART CATH AND CORONARY ANGIOGRAPHY N/A 06/28/2017   Procedure: LEFT HEART CATH AND CORONARY ANGIOGRAPHY;  Surgeon: Minna Merritts, MD;  Location: [place] CV LAB;  Service: Cardiovascular;  Laterality: N/A;   LUMBAR LAMINECTOMY/DECOMPRESSION MICRODISCECTOMY Bilateral 03/10/2018   Procedure: Laminectomy and Foraminotomy - Lumbar three-Lumbar four - Lumbar four-Lumbar five - bilateral;  Surgeon: Earnie Larsson, MD;  Location: [place];  Service: Neurosurgery;  Laterality: Bilateral;   NEPHRECTOMY  1992   left- cancer   RIB RESECTION     removal 4 ribs   TONSILLECTOMY      Family History  Problem Relation Age of Onset   Colon cancer Mother     Cerebral aneurysm Father    Heart attack Father    Heart attack Brother 43   Healthy Son     Social History   Socioeconomic History   Marital status: Single    Spouse name: Not on file   Number of children: Not on file   Years of education: Not on file   Highest education level: Not on file  Occupational History   Occupation: Metallurgist: [employer]  Tobacco Use   Smoking status: Never    Passive exposure: Never   Smokeless tobacco: Never  Vaping Use   Vaping Use: Never used  Substance and Sexual Activity   Alcohol use: No   Drug use: No   Sexual activity: Not on  file  Other Topics Concern   Not on file  Social History Narrative   Not on file   Social Determinants of Health   Financial Resource Strain: Not on file  Food Insecurity: Not on file  Transportation Needs: Not on file  Physical Activity: Not on file  Stress: Not on file  Social Connections: Not on file  Intimate Partner Violence: Not on file    Review of Systems  Constitutional: Negative.   HENT: Negative.    Eyes: Negative.   Respiratory:  Negative for cough and shortness of breath.   Cardiovascular:  Negative for chest pain and leg swelling.  Genitourinary: Negative.   Musculoskeletal:        Left lower leg pain.  Skin: Negative.   Neurological:  Negative for dizziness, tingling and headaches.  Psychiatric/Behavioral:  Negative for depression, substance abuse and suicidal ideas.         Objective    BP (!) 172/95   Pulse 70   Ht '5\' 10"'$  (1.778 m)   Wt 268 lb 6 oz (121.7 kg)   BMI 38.51 kg/m   Physical Exam Constitutional:      Appearance: Normal appearance. She is obese.  HENT:     Right Ear: Tympanic membrane normal.     Left Ear: Tympanic membrane normal.     Nose: Nose normal.     Mouth/Throat:     Mouth: Mucous membranes are moist.     Pharynx: Oropharynx is clear.  Eyes:     Conjunctiva/sclera: Conjunctivae normal.     Pupils: Pupils are equal, round, and  reactive to light.  Cardiovascular:     Rate and Rhythm: Normal rate and regular rhythm.     Pulses: Normal pulses.     Heart sounds: Normal heart sounds.  Pulmonary:     Effort: No respiratory distress.     Breath sounds: Normal breath sounds. No stridor.  Abdominal:     General: Bowel sounds are normal.     Palpations: Abdomen is soft. There is no mass.     Tenderness: There is no abdominal tenderness.  Musculoskeletal:     Left lower leg: Tenderness present.  Skin:    General: Skin is warm.     Capillary Refill: Capillary refill takes less than 2 seconds.  Neurological:     General: No focal deficit present.     Mental Status: She is alert and oriented to person, place, and time. Mental status is at baseline.  Psychiatric:        Mood and Affect: Mood normal.        Behavior: Behavior normal.        Thought Content: Thought content normal.        Judgment: Judgment normal.         Assessment & Plan:   Problem List Items Addressed This Visit       Cardiovascular and Mediastinum   HTN (hypertension)    Blood pressure elevated in the office today. Started her on losartan hydrochlorothiazide 50-12.5 mg half a tablet once a day. Continue carvedilol. Advised patient to make appointment next week for labs and blood pressure check with nurse and in 2 weeks with me for blood pressure check. Advised patient to continue take blood pressure 3 times a week and make a log and bring it to the next appointment.      Relevant Medications   losartan-hydrochlorothiazide (HYZAAR) 50-12.5 MG tablet   Coronary artery disease of native artery of  native heart with stable angina pectoris ([place])    Stable at present. Followed by the endocrinologist      Relevant Medications   losartan-hydrochlorothiazide (HYZAAR) 50-12.5 MG tablet     Other   Class 2 severe obesity with serious comorbidity and body mass index (BMI) of 38.0 to 38.9 in adult [facility]) - Primary    Body mass index is 38.51  kg/m. Advised pt to lose weight. Advised patient to avoid trans fat, fatty and fried food. Follow a regular physical activity schedule. Went over the risk of chronic diseases with increased weight.           Pain in left lower leg    Advised patient to take gabapentin 100 mg daily at nighttime. We will continue to monitor if needed we will send her to the neurologist for further evaluation.       Return for 1 week for BP check and labs with nurse in 2 weeks BP check .   [name], NP

## 2022-07-26 NOTE — Assessment & Plan Note (Signed)
Advised patient to take gabapentin 100 mg daily at nighttime. We will continue to monitor if needed we will send her to the neurologist for further evaluation.

## 2022-07-26 NOTE — Assessment & Plan Note (Signed)
Stable at present. Followed by the endocrinologist

## 2022-07-26 NOTE — Assessment & Plan Note (Addendum)
Blood pressure elevated in the office today. Started her on losartan hydrochlorothiazide 50-12.5 mg half a tablet once a day. Continue carvedilol. Advised patient to make appointment next week for labs and blood pressure check with nurse and in 2 weeks with me for blood pressure check. Advised patient to continue take blood pressure 3 times a week and make a log and bring it to the next appointment.

## 2022-07-26 NOTE — Assessment & Plan Note (Signed)
Body mass index is 38.51 kg/m. Advised pt to lose weight. Advised patient to avoid trans fat, fatty and fried food. Follow a regular physical activity schedule. Went over the risk of chronic diseases with increased weight.     

## 2022-07-29 ENCOUNTER — Encounter: Payer: Self-pay | Admitting: Emergency Medicine

## 2022-07-29 ENCOUNTER — Other Ambulatory Visit: Payer: Self-pay

## 2022-07-29 ENCOUNTER — Emergency Department
Admission: EM | Admit: 2022-07-29 | Discharge: 2022-07-29 | Disposition: A | Discharge status: Home / Self Care | Payer: Medicare Other | Attending: Emergency Medicine | Admitting: Emergency Medicine

## 2022-07-29 DIAGNOSIS — W1789XA Other fall from one level to another, initial encounter: Secondary | ICD-10-CM | POA: Insufficient documentation

## 2022-07-29 DIAGNOSIS — M7918 Myalgia, other site: Secondary | ICD-10-CM | POA: Diagnosis present

## 2022-07-29 DIAGNOSIS — Y9259 Other trade areas as the place of occurrence of the external cause: Secondary | ICD-10-CM | POA: Insufficient documentation

## 2022-07-29 MED ORDER — ONDANSETRON 4 MG PO TBDP: 4.0000 mg | ORAL_TABLET | Freq: Three times a day (TID) | ORAL | 0 refills | Status: DC | PRN

## 2022-07-29 MED ORDER — HYDROCODONE-ACETAMINOPHEN 5-325 MG PO TABS: 1.0000 | ORAL_TABLET | Freq: Four times a day (QID) | ORAL | 0 refills | Status: DC | PRN

## 2022-07-29 MED ORDER — HYDROCODONE-ACETAMINOPHEN 5-325 MG PO TABS
1.0000 | ORAL_TABLET | Freq: Once | ORAL | Status: AC
  Administered 2022-07-29: 1 via ORAL
  Filled 2022-07-29: qty 1

## 2022-07-29 MED ORDER — ONDANSETRON 4 MG PO TBDP
4.0000 mg | ORAL_TABLET | Freq: Once | ORAL | Status: AC
  Administered 2022-07-29: 4 mg via ORAL
  Filled 2022-07-29: qty 1

## 2022-07-29 NOTE — Discharge Instructions (Signed)
Follow-up with your primary care provider if any continued problems or concerns.  You will be sore in more places tomorrow than you are currently.  The soreness can last 4 to 5 days.  A prescription for hydrocodone and Zofran was sent to the pharmacy to take as needed for pain.  Do not drive or operate machinery while taking the pain medication as it could cause drowsiness and increase your risk for falling or other injuries.  Use ice or heat to your muscles as needed for discomfort.

## 2022-07-29 NOTE — ED Triage Notes (Addendum)
Pt was in garage at clients house trying to get out of car and fell.  No LOC.  No blood thinners.  Pain to left leg mostly and left foot.  Also having pain to left hand and some mild shoulder pain.  No back pain on palpation.  Pt able to move LUE without difficulty to reach out and hold signature pad.  No pain on palpation of left ankle or femur. No obvious deformity.  No pain with movement of either ankle.  Pt is achy all over.

## 2022-07-29 NOTE — ED Triage Notes (Signed)
Pt in via EMS from work with c/o fall. Pt was getting out of the cat, lost her balance and fell. Pt with pain to right and left foot, left arm and back. Pt was ambulatory on scene, no LOC, no head injuries.

## 2022-07-29 NOTE — ED Provider Notes (Signed)
[facility] Provider Note    Event Date/Time   First MD Initiated Contact with Patient 07/29/22 1416     (approximate)   History   Fall   HPI  Carrie Larsen is a 76 y.o. female   presents to the ED via EMS after she fell at a client's home getting out of her car.  Patient states that she did not actually hit the garage floor but was sort of sitting sideways in her car trying to get through a tight area then lost her balance and fell to the garage floor from the height of her car seat.  Patient denies any head injury or loss of consciousness.  Patient was able to call EMS herself.  At the scene patient was ambulatory but has multiple complaints of muscle aches.  Patient describes it as feeling "achy all over".      Physical Exam   Triage Vital Signs: ED Triage Vitals  Enc Vitals Group     BP 07/29/22 1321 109/83     Pulse Rate 07/29/22 1321 83     Resp 07/29/22 1321 20     Temp 07/29/22 1321 98.2 F (36.8 C)     Temp Source 07/29/22 1321 Oral     SpO2 07/29/22 1321 91 %     Weight 07/29/22 1337 260 lb (117.9 kg)     Height 07/29/22 1337 '5\' 10"'$  (1.778 m)     Head Circumference --      Peak Flow --      Pain Score 07/29/22 1337 6     Pain Loc --      Pain Edu? --      Excl. in [place]? --     Most recent vital signs: Vitals:   07/29/22 1321  BP: 109/83  Pulse: 83  Resp: 20  Temp: 98.2 F (36.8 C)  SpO2: 91%     General: Awake, no distress.  Alert and talkative. CV:  Good peripheral perfusion.  Heart regular rate and rhythm. Resp:  Normal effort.  Lungs clear bilaterally. Abd:  No distention.  Soft, nontender, bowel sounds present. Other:  No point tenderness on palpation of cervical spine, thoracic or lumbar spine.  Patient is able to move upper extremities without any difficulty and no tenderness is noted on palpation.  No pain with movement of the lower extremities and with compression of the hip area.  No deformity is noted, no  ecchymosis and no abrasions are seen.   ED Results / Procedures / Treatments   Labs (all labs ordered are listed, but only abnormal results are displayed) Labs Reviewed - No data to display     RADIOLOGY Deferred    PROCEDURES:  Critical Care performed:   Procedures   MEDICATIONS ORDERED IN ED: Medications  ondansetron (ZOFRAN-ODT) disintegrating tablet 4 mg (4 mg Oral Given 07/29/22 1441)  HYDROcodone-acetaminophen (NORCO/VICODIN) 5-325 MG per tablet 1 tablet (1 tablet Oral Given 07/29/22 1441)     IMPRESSION / MDM / [place] / ED COURSE  I reviewed the triage vital signs and the nursing notes.   Differential diagnosis includes, but is not limited to, musculoskeletal strain generalized.  76 year old female presents to the ED after having an injury while trying to get out of her car parked in a garage.  Patient fell from the car seat onto the garage floor and denies any head injury or loss of consciousness.  Patient was able to use her cell phone to  call EMS.  At the same she was ambulatory after the staff got her up from the ground.  She denies any specific pain but states that her pain is generalized all over.  Physical exam does not elicit any joint pain, effusions, discolorations or strong suspicion for fracture.  Patient was given hydrocodone and Zofran to prevent nausea from the medication.  Prior to discharge patient was feeling much better and states that the medication did not cause any nausea.  We discussed sending medication to the pharmacy but she is aware that she cannot take the hydrocodone and drive as it could cause drowsiness and increase her risk for injury or falling.  Patient is encouraged to follow-up with her primary care provider if any continued problems or return to the emergency department if any worsening of her symptoms.      Patient's presentation is most consistent with acute, uncomplicated illness.  FINAL CLINICAL IMPRESSION(S) / ED  DIAGNOSES   Final diagnoses:  Musculoskeletal pain     Rx / DC Orders   ED Discharge Orders          Ordered    HYDROcodone-acetaminophen (NORCO/VICODIN) 5-325 MG tablet  Every 6 hours PRN        07/29/22 1536    ondansetron (ZOFRAN-ODT) 4 MG disintegrating tablet  Every 8 hours PRN        07/29/22 1536             Note:  This document was prepared using Dragon voice recognition software and may include unintentional dictation errors.   Johnn Hai, PA-C 07/29/22 1549    Rada Hay, MD 07/30/22 [phone]

## 2022-08-05 ENCOUNTER — Telehealth: Payer: Self-pay | Admitting: Student

## 2022-08-05 NOTE — Telephone Encounter (Signed)
   Patient called answering service with concerns about her blood pressure.  She states she is had a shortness of breath and cough for the past 2 weeks and has felt funny/unwell over the past 2 days. She also reports some lower extremity swelling but no significant weight. No chest pain at this time. She woke up this morning and still was not feeling well and checked her blood pressure and it was in the 190s/100s.  She waited a while and took her blood pressure again it was in the 170s/100s.  Most recent blood pressure up for calling was in the 160s/100s.  He has taken all of her blood pressure medications this morning.  She was recently started on Losartan-HCTZ by her PCP.  She states her BP is normally in the 160s/80s.  She is very concerned about all of her symptoms.  It sounds like she is at least mildly volume overloaded and may benefit from an increase in her Lasix (she only takes this as needed).  However, given how concerned she is, I discussed that she may need to go be evaluated in Urgent Care. She agreed with this plan.  Darreld Mclean, PA-C 08/05/2022 11:48 AM

## 2022-08-06 ENCOUNTER — Telehealth: Payer: Self-pay | Admitting: Cardiovascular Disease

## 2022-08-06 NOTE — Telephone Encounter (Signed)
I spoke with the patient. She states she has had a fluctuation in her BP over the last 2 weeks. She was seen in ER after a mechanical fall out of her car on 9/3. She called the on - call APP yesterday with reports of elevated BP/ cough, that per Sande Rives, PA, were concerning for volume overload. The patient reports taking lasix yesterday with resolution of her cough last night. She is having reports of lower extremity edema/ feelings of lightheadedness as well.   She did not go to Urgent Care yesterday.  I advised the patient that since symptoms are not acutely different, I do feel like she needs to be evaluated in the office. 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   I offered her an appointment tomorrow at 11:00 am with Dr. Rockey Situ.  The patient is agreeable with this appointment.   I have advised her if symptoms become acutely worse this afternoon or overnight, she should report to the ER for further evaluation/ possible treatment.   The patient voices understanding and is agreeable.

## 2022-08-06 NOTE — Telephone Encounter (Signed)
  Pt c/o BP issue: STAT if pt c/o blurred vision, one-sided weakness or slurred speech  1. What are your last 5 BP readings?  192/95 138/83  2. Are you having any other symptoms (ex. Dizziness, headache, blurred vision, passed out)? None   3. What is your BP issue? Pt said, her BP been fluctuating really bad, it was 192/95 when she woke up yesterday then goes down to 138/83. She is concerned how her BP elevated that high then goes back down low. She denied any symptoms, she just wanted to know what she needs to do, if she needs to get an appt to see Dr. Rockey Situ. 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

## 2022-08-07 ENCOUNTER — Ambulatory Visit: Payer: Medicare Other | Admitting: Cardiovascular Disease

## 2022-08-08 ENCOUNTER — Ambulatory Visit: Payer: Medicare Other | Attending: Cardiovascular Disease | Admitting: Medical

## 2022-08-08 ENCOUNTER — Encounter: Payer: Self-pay | Admitting: Medical

## 2022-08-08 VITALS — BP 140/90 | HR 83 | Ht 70.0 in | Wt 266.2 lb

## 2022-08-08 DIAGNOSIS — I2510 Atherosclerotic heart disease of native coronary artery without angina pectoris: Secondary | ICD-10-CM | POA: Diagnosis not present

## 2022-08-08 DIAGNOSIS — R0609 Other forms of dyspnea: Secondary | ICD-10-CM

## 2022-08-08 DIAGNOSIS — I10 Essential (primary) hypertension: Secondary | ICD-10-CM | POA: Diagnosis present

## 2022-08-08 DIAGNOSIS — E782 Mixed hyperlipidemia: Secondary | ICD-10-CM | POA: Diagnosis not present

## 2022-08-08 DIAGNOSIS — R600 Localized edema: Secondary | ICD-10-CM

## 2022-08-08 MED ORDER — CARVEDILOL 25 MG PO TABS: 25.0000 mg | ORAL_TABLET | Freq: Two times a day (BID) | ORAL | 1 refills | Status: DC

## 2022-08-08 NOTE — Patient Instructions (Signed)
Medication Instructions:  - Your physician has recommended you make the following change in your medication:   1) INCREASE Coreg (Carvedilol) to 25 mg: - take 1 tablet (25 mg) by mouth TWICE daily   *If you need a refill on your cardiac medications before your next appointment, please call your pharmacy*   Lab Work: - none ordered  If you have labs (blood work) drawn today and your tests are completely normal, you will receive your results only by: [place] (if you have MyChart) OR A paper copy in the mail If you have any lab test that is abnormal or we need to change your treatment, we will call you to review the results.   Testing/Procedures: 1) Echocardiogram: - Your physician has requested that you have an echocardiogram. Echocardiography is a painless test that uses sound waves to create images of your heart. It provides your doctor with information about the size and shape of your heart and how well your heart's chambers and valves are working. This procedure takes approximately one hour. There are no restrictions for this procedure. There is a possibility that an IV may need to be started during your test to inject an image enhancing agent. This is done to obtain more optimal pictures of your heart. Therefore we ask that you do at least drink some water prior to coming in to hydrate your veins.     Follow-Up: At [facility], you and your health needs are our priority.  As part of our continuing mission to provide you with exceptional heart care, we have created designated Provider Care Teams.  These Care Teams include your primary Cardiologist (physician) and Advanced Practice Providers (APPs -  Physician Assistants and Nurse Practitioners) who all work together to provide you with the care you need, when you need it.  We recommend signing up for the patient portal called "MyChart".  Sign up information is provided on this After Visit Summary.  MyChart is used to  connect with patients for Virtual Visits (Telemedicine).  Patients are able to view lab/test results, encounter notes, upcoming appointments, etc.  Non-urgent messages can be sent to your provider as well.   To learn more about what you can do with MyChart, go to [email or web address].    Your next appointment:   1 month(s)  The format for your next appointment:   In Person  Provider:   You may see Ida Rogue, MD or one of the following Advanced Practice Providers on your designated Care Team:    Cadence Kathlen Mody, [place]   Other Instructions  Echocardiogram An echocardiogram is a test that uses sound waves (ultrasound) to produce images of the heart. Images from an echocardiogram can provide important information about: Heart size and shape. The size and thickness and movement of your heart's walls. Heart muscle function and strength. Heart valve function or if you have stenosis. Stenosis is when the heart valves are too narrow. If blood is flowing backward through the heart valves (regurgitation). A tumor or infectious growth around the heart valves. Areas of heart muscle that are not working well because of poor blood flow or injury from a heart attack. Aneurysm detection. An aneurysm is a weak or damaged part of an artery wall. The wall bulges out from the normal force of blood pumping through the body. Tell a health care provider about: Any allergies you have. All medicines you are taking, including vitamins, herbs, eye drops, creams, and over-the-counter medicines. Any blood disorders you have.  Any surgeries you have had. Any medical conditions you have. Whether you are pregnant or may be pregnant. What are the risks? Generally, this is a safe test. However, problems may occur, including an allergic reaction to dye (contrast) that may be used during the test. What happens before the test? No specific preparation is needed. You may eat and drink normally. What happens  during the test?  You will take off your clothes from the waist up and put on a hospital gown. Electrodes or electrocardiogram (ECG)patches may be placed on your chest. The electrodes or patches are then connected to a device that monitors your heart rate and rhythm. You will lie down on a table for an ultrasound exam. A gel will be applied to your chest to help sound waves pass through your skin. A handheld device, called a transducer, will be pressed against your chest and moved over your heart. The transducer produces sound waves that travel to your heart and bounce back (or "echo" back) to the transducer. These sound waves will be captured in real-time and changed into images of your heart that can be viewed on a video monitor. The images will be recorded on a computer and reviewed by your health care provider. You may be asked to change positions or hold your breath for a short time. This makes it easier to get different views or better views of your heart. In some cases, you may receive contrast through an IV in one of your veins. This can improve the quality of the pictures from your heart. The procedure may vary among health care providers and hospitals. What can I expect after the test? You may return to your normal, everyday life, including diet, activities, and medicines, unless your health care provider tells you not to do that. Follow these instructions at home: It is up to you to get the results of your test. Ask your health care provider, or the department that is doing the test, when your results will be ready. Keep all follow-up visits. This is important. Summary An echocardiogram is a test that uses sound waves (ultrasound) to produce images of the heart. Images from an echocardiogram can provide important information about the size and shape of your heart, heart muscle function, heart valve function, and other possible heart problems. You do not need to do anything to prepare  before this test. You may eat and drink normally. After the echocardiogram is completed, you may return to your normal, everyday life, unless your health care provider tells you not to do that. This information is not intended to replace advice given to you by your health care provider. Make sure you discuss any questions you have with your health care provider. Document Revised: 07/26/2021 Document Reviewed: 07/05/2020 Elsevier Patient Education  [place]

## 2022-08-08 NOTE — Progress Notes (Signed)
Cardiology Office Note:    Date:  08/08/2022   ID:  Carrie Larsen [place], [name] 1946/03/30, MRN [id number]  PCP:  [name], NP  [facility] Cardiologist:  Ida Rogue, MD  [facility] Electrophysiologist:  None   Referring MD: Rutherford Limerick, PA   Chief Complaint: 3 month follow-up  History of Present Illness:    Carrie Larsen is a 76 y.o. female with a hx of CAD PCI to OM 2018, HLD, colon cancer, s/p Left nephrectomy who presents for follow-up.   H/o CAD with NSTEMI in 06/2017. Treated with PCI to OM in 06/2017 with occluded RCA with collaterals from L>R. Echo at that time showed LVEF 60-65%, no WMA, 1DD.   Last seen 05/2022 and reported stable SOB. No changes were made.   Today, the patient reports she had a mechanical fall in May and went to [facility]. 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 She may go see a neurologist. Since then she has used a walker. Patient starting having a cough, chest tightness, trouble laying flat, and lower leg edema. She called the office and was instructed to take lasix '40mg'$  once. She reports resolution of symptoms. Patient denies lower leg edema, shortness of breath, orthopnea, pnd. PCP recently started Losartan-HCTZ. BP is mildly elevated today. Patient does not take lasix every day due to gout.   Past Medical History:  Diagnosis Date   Anxiety    Arthritis    osteoarthritis   CAD (coronary artery disease)    a. 2012 ETT: no ischemia;  b. 06/2017 NSTEMI/PCI: LM nl, LAD nl, D1 70-80p, LCX nl, OM1 90 (3.0 x 23 Xience [place]), RCA dominant, 100p/m, fills via L->R collats, EF 55-65%.   Cancer [facility])    a. s/p partial left nephrectomy.   Chicken pox    CME (cystoid macular edema), left 11/20/2018   Colon cancer ([place]) 1992   T3, N1, M0. colon    Colon polyp 2011   COVID-19 73/7106   Diastolic dysfunction    a. 08/2015 Echo: EF 60-65%, no rwma, Gr1 DD, midlly dil LA, PASP 8mHg. No significant valvular dzs; b. 06/2017 Echo: EF 60-65%,  Gr1DD, mildly dil LA.   Essential hypertension    GERD (gastroesophageal reflux disease)    Gout    History of appendectomy 06/07/2015   History of kidney cancer 11/20/2018   History of kidney stones    passed - 2   Hyperlipidemia    Lumbar spinal stenosis    [place]ocardial infarction [place]    06/2017   Nonexudative age-related macular degeneration, bilateral, early dry stage 11/20/2018   Obesity    Prediabetes    Pseudophakia of both eyes    Retinal cyst     Past Surgical History:  Procedure Laterality Date   ABDOMINAL HYSTERECTOMY     APPENDECTOMY  06/07/2015   CARDIAC CATHETERIZATION     CATARACT EXTRACTION W/PHACO Left 04/24/2016   Procedure: CATARACT EXTRACTION PHACO AND INTRAOCULAR LENS PLACEMENT ([place];  Surgeon: WBirder Robson MD;  Location: [facility] ORS;  Service: Ophthalmology;  Laterality: Left;  [place]45.[place] AP% 16.4 CDE 7.53 FLUID PACK LOT # [id number]   CATARACT EXTRACTION W/PHACO Right 06/05/2016   Procedure: CATARACT EXTRACTION PHACO AND INTRAOCULAR LENS PLACEMENT ([facility]);  Surgeon: WBirder Robson MD;  Location: [facility] ORS;  Service: Ophthalmology;  Laterality: Right;  [place]25.9 AP% 21.9 CDE 5.68 Fluid pack lot # [id number]   COLON RESECTION  03/04/1991   COLON SURGERY  COLONOSCOPY  03-14-10   Dr Bary Castilla, tubular adenoma at 25 cm.   COLONOSCOPY WITH PROPOFOL N/A 06/07/2015   Procedure: COLONOSCOPY WITH PROPOFOL;  Surgeon: Robert Bellow, MD;  Location: [facility] ENDOSCOPY;  Service: Endoscopy;  Laterality: N/A;   COLONOSCOPY WITH PROPOFOL N/A 08/10/2020   Procedure: COLONOSCOPY WITH PROPOFOL;  Surgeon: Robert Bellow, MD;  Location: [facility] ENDOSCOPY;  Service: Endoscopy;  Laterality: N/A;   CORONARY STENT INTERVENTION N/A 06/28/2017   Procedure: CORONARY STENT INTERVENTION;  Surgeon: Yolonda Kida, MD;  Location: [place] CV LAB;  Service: Cardiovascular;  Laterality: N/A;   EYE SURGERY     lens eye surgery   HERNIA REPAIR  [id number]   umbilical   LEFT HEART CATH AND  CORONARY ANGIOGRAPHY N/A 06/28/2017   Procedure: LEFT HEART CATH AND CORONARY ANGIOGRAPHY;  Surgeon: Minna Merritts, MD;  Location: [place] CV LAB;  Service: Cardiovascular;  Laterality: N/A;   LUMBAR LAMINECTOMY/DECOMPRESSION MICRODISCECTOMY Bilateral 03/10/2018   Procedure: Laminectomy and Foraminotomy - Lumbar three-Lumbar four - Lumbar four-Lumbar five - bilateral;  Surgeon: Earnie Larsson, MD;  Location: [place];  Service: Neurosurgery;  Laterality: Bilateral;   NEPHRECTOMY  1992   left- cancer   RIB RESECTION     removal 4 ribs   TONSILLECTOMY      Current Medications: Current Meds  Medication Sig   aspirin EC 81 MG tablet Take 81 mg by mouth daily. Swallow whole.   atorvastatin (LIPITOR) 80 MG tablet Take 1 tablet by mouth once daily   carvedilol (COREG) 25 MG tablet Take 1 tablet (25 mg total) by mouth 2 (two) times daily.   colchicine 0.6 MG tablet TAKE 1 TABLET BY MOUTH THREE TIMES DAILY UNTIL STOMACH UPSET OR FLARES SUBSIDES. THEN FOR MAINTENANCE TAKE 1 TABLET ONCE DAILY   esomeprazole (NEXIUM) 40 MG capsule Take 40 mg by mouth daily in the afternoon.   furosemide (LASIX) 20 MG tablet Take 1 tablet (20 mg total) by mouth daily as needed.   gabapentin (NEURONTIN) 100 MG capsule Take 100 mg by mouth 3 (three) times daily as needed.   isosorbide mononitrate (IMDUR) 30 MG 24 hr tablet Take 1 tablet (30 mg total) by mouth in the morning and at bedtime.   lidocaine (LIDODERM) 5 % Place 1 patch onto the skin daily. Remove & Discard patch within 12 hours or as directed by MD   losartan-hydrochlorothiazide (HYZAAR) 50-12.5 MG tablet Take 1/2 tablet once a day.   Multiple Vitamins-Minerals (MULTIVITAMIN WITH MINERALS) tablet Take 1 tablet by mouth daily.   nitroGLYCERIN (NITROSTAT) 0.4 MG SL tablet Place 1 tablet (0.4 mg total) under the tongue every 5 (five) minutes x 3 doses as needed for chest pain.   ondansetron (ZOFRAN-ODT) 4 MG disintegrating tablet Take 1 tablet (4 mg total) by  mouth every 8 (eight) hours as needed for nausea or vomiting.   potassium chloride (KLOR-CON) 10 MEQ tablet Take 1 tablet (10 mEq total) by mouth daily as needed (when taking furosemide).   traMADol (ULTRAM) 50 MG tablet Take 50 mg by mouth daily.    [DISCONTINUED] carvedilol (COREG) 12.5 MG tablet Take 1 tablet (12.5 mg total) by mouth 2 (two) times daily with a meal.   Current Facility-Administered Medications for the 08/08/22 encounter (Office Visit) with Kathlen Mody, Adarrius Graeff H, PA-C  Medication   betamethasone acetate-betamethasone sodium phosphate (CELESTONE) injection 3 mg   dexamethasone (DECADRON) injection 4 mg   triamcinolone acetonide (KENALOG) 10 MG/ML injection 10 mg  Allergies:   Morphine and related, Prednisone, Amlodipine, and Latex   Social History   Socioeconomic History   Marital status: Single    Spouse name: Not on file   Number of children: Not on file   Years of education: Not on file   Highest education level: Not on file  Occupational History   Occupation: Metallurgist: [employer]  Tobacco Use   Smoking status: Never    Passive exposure: Never   Smokeless tobacco: Never  Vaping Use   Vaping Use: Never used  Substance and Sexual Activity   Alcohol use: No   Drug use: No   Sexual activity: Not on file  Other Topics Concern   Not on file  Social History Narrative   Not on file   Social Determinants of Health   Financial Resource Strain: Not on file  Food Insecurity: Not on file  Transportation Needs: Not on file  Physical Activity: Not on file  Stress: Not on file  Social Connections: Not on file     Family History: The patient's family history includes Cerebral aneurysm in her father; Colon cancer in her mother; Healthy in her son; Heart attack in her father; Heart attack (age of onset: 67) in her brother.  ROS:   Please see the history of present illness.     All other systems reviewed and are negative.  EKGs/Labs/Other  Studies Reviewed:    The following studies were reviewed today:  Cardiac cath 2018 A STENT XIENCE ALPINE RX 3.0X23 drug eluting stent was successfully placed, and does not overlap previously placed stent. 1st Mrg lesion, 75 %stenosed. Post intervention, there is a 0% residual stenosis.   Conclusions Successful PCI and stent of OM1 lesion 75% down to 0% with a DES  Echo 2018 - Left ventricle: The cavity size was normal. Systolic function was    normal. The estimated ejection fraction was in the range of 60%    to 65%. Wall motion was normal; there were no regional wall    motion abnormalities. Doppler parameters are consistent with    abnormal left ventricular relaxation (grade 1 diastolic    dysfunction).  - Left atrium: The atrium was mildly dilated.  - Right ventricle: Systolic function was normal.  - Pulmonary arteries: Systolic pressure was within the normal    range.   EKG:  EKG is not ordered today.    Recent Labs: 08/27/2021: BUN 17; Creatinine, Ser 0.99; Hemoglobin 15.2; Platelets 242; Potassium 3.9; Sodium 139  Recent Lipid Panel    Component Value Date/Time   CHOL 172 06/25/2019 1018   CHOL 169 06/26/2018 0823   TRIG 135 06/25/2019 1018   HDL 45 06/25/2019 1018   HDL 51 06/26/2018 0823   CHOLHDL 3.8 06/25/2019 1018   VLDL 27 06/25/2019 1018   LDLCALC 100 (H) 06/25/2019 1018   LDLCALC 88 06/26/2018 0823    Physical Exam:    VS:  BP (!) 140/90 (BP Location: Left Arm, Patient Position: Sitting, Cuff Size: Large)   Pulse 83   Ht '5\' 10"'$  (1.778 m)   Wt 266 lb 4 oz (120.8 kg)   SpO2 94%   BMI 38.20 kg/m     Wt Readings from Last 3 Encounters:  08/08/22 266 lb 4 oz (120.8 kg)  07/29/22 260 lb (117.9 kg)  07/26/22 268 lb 6 oz (121.7 kg)     GEN:  Well nourished, well developed in no acute distress HEENT: Normal NECK: No JVD;  No carotid bruits LYMPHATICS: No lymphadenopathy CARDIAC: RRR, no murmurs, rubs, gallops RESPIRATORY:  Clear to auscultation  without rales, wheezing or rhonchi  ABDOMEN: Soft, non-tender, non-distended MUSCULOSKELETAL:  No edema; No deformity  SKIN: Warm and dry NEUROLOGIC:  Alert and oriented x 3 PSYCHIATRIC:  Normal affect   ASSESSMENT:    1. Dyspnea on exertion   2. Lower extremity edema   3. Coronary artery disease involving native coronary artery of native heart without angina pectoris   4. Hyperlipidemia, mixed   5. Essential hypertension    PLAN:    In order of problems listed above:  DOE/LLE Patient reports she previously was experiencing DOE, orthopnea and lower leg edema. She called the office and was instructed to take lasix '40mg'$  once. Since then, symptoms have resolved. I will order an echocardiogram. She says PCP will check labs tomorrow.  She has lasix to use as needed, she is not taking it daily due to ?gout. She is on HCTZ 12.'5mg'$  daily, which may be worse for gout. We will see her back after the echocardiogram.   CAD s/p PCI OM1 She reported chest tightness in the setting of suspected volume overload. Last cath was in 2018 (report above. Continue ASA, lipitor and Coreg. Echo as above.   HLD PCP will update labs. Continue Lipitor '80mg'$  daily.   HTN BP mildly elevated. PCP started Losartan-HCTZ. I will increase Coreg to '25mg'$  BID.   Disposition: Follow up in 1 month(s) with MD/APP    Signed, Hollyn Stucky Ninfa Meeker, PA-C  08/08/2022 4:38 PM    [facility]

## 2022-08-09 ENCOUNTER — Ambulatory Visit (INDEPENDENT_AMBULATORY_CARE_PROVIDER_SITE_OTHER): Payer: Medicare Other | Admitting: Nurse Practitioner

## 2022-08-09 ENCOUNTER — Encounter: Payer: Self-pay | Admitting: Nurse Practitioner

## 2022-08-09 VITALS — BP 138/79 | HR 79 | Ht 70.0 in | Wt 266.5 lb

## 2022-08-09 DIAGNOSIS — M109 Gout, unspecified: Secondary | ICD-10-CM | POA: Diagnosis not present

## 2022-08-09 DIAGNOSIS — E782 Mixed hyperlipidemia: Secondary | ICD-10-CM | POA: Diagnosis not present

## 2022-08-09 DIAGNOSIS — I10 Essential (primary) hypertension: Secondary | ICD-10-CM

## 2022-08-09 DIAGNOSIS — R739 Hyperglycemia, unspecified: Secondary | ICD-10-CM

## 2022-08-09 NOTE — Assessment & Plan Note (Signed)
Patient had elevated blood sugar results. Check hemoglobin A1c

## 2022-08-09 NOTE — Assessment & Plan Note (Signed)
Stable at present. We will check check uric acid level

## 2022-08-09 NOTE — Assessment & Plan Note (Signed)
Advised patient to stop losartan HCTZ carvedilol 25 mg twice daily. Advised patient to monitor blood pressure and call the office with blood pressure readings.

## 2022-08-09 NOTE — Progress Notes (Signed)
Established Patient Office Visit  Subjective:  Patient ID: Carrie Larsen, female    DOB: 1946/05/08  Age: 76 y.o. MRN: [id number]  CC:  Chief Complaint  Patient presents with   Follow-up     HPI  Carrie Larsen presents for blood pressure check. Her BP at home varies between 130's and 80's. She had an appointment with the cardiologist yesterday and she increased ger cardivolol to 25 mg daily.  HPI   Past Medical History:  Diagnosis Date   Anxiety    Arthritis    osteoarthritis   CAD (coronary artery disease)    a. 2012 ETT: no ischemia;  b. 06/2017 NSTEMI/PCI: LM nl, LAD nl, D1 70-80p, LCX nl, OM1 90 (3.0 x 23 Xience [place]), RCA dominant, 100p/m, fills via L->R collats, EF 55-65%.   Cancer [facility])    a. s/p partial left nephrectomy.   Chicken pox    CME (cystoid macular edema), left 11/20/2018   Colon cancer ([place]) 1992   T3, N1, M0. colon    Colon polyp 2011   COVID-19 32/9518   Diastolic dysfunction    a. 08/2015 Echo: EF 60-65%, no rwma, Gr1 DD, midlly dil LA, PASP 74mHg. No significant valvular dzs; b. 06/2017 Echo: EF 60-65%, Gr1DD, mildly dil LA.   Essential hypertension    GERD (gastroesophageal reflux disease)    Gout    History of appendectomy 06/07/2015   History of kidney cancer 11/20/2018   History of kidney stones    passed - 2   Hyperlipidemia    Lumbar spinal stenosis    Myocardial infarction ([place]    06/2017   Nonexudative age-related macular degeneration, bilateral, early dry stage 11/20/2018   Obesity    Prediabetes    Pseudophakia of both eyes    Retinal cyst     Past Surgical History:  Procedure Laterality Date   ABDOMINAL HYSTERECTOMY     APPENDECTOMY  06/07/2015   CARDIAC CATHETERIZATION     CATARACT EXTRACTION W/PHACO Left 04/24/2016   Procedure: CATARACT EXTRACTION PHACO AND INTRAOCULAR LENS PLACEMENT ([place];  Surgeon: WBirder Robson MD;  Location: [facility];  Service: Ophthalmology;  Laterality: Left;  [place]45.[place] AP% 16.4 CDE  7.53 FLUID PACK LOT # [id number]   CATARACT EXTRACTION W/PHACO Right 06/05/2016   Procedure: CATARACT EXTRACTION PHACO AND INTRAOCULAR LENS PLACEMENT ([facility]);  Surgeon: WBirder Robson MD;  Location: [facility];  Service: Ophthalmology;  Laterality: Right;  [place]25.[place] AP% 21.9 CDE 5.68 Fluid pack lot # [id number]   COLON RESECTION  03/04/1991   COLON SURGERY     COLONOSCOPY  03-14-10   Dr BBary Castilla tubular adenoma at 25 cm.   COLONOSCOPY WITH PROPOFOL N/A 06/07/2015   Procedure: COLONOSCOPY WITH PROPOFOL;  Surgeon: JRobert Bellow MD;  [facility];  Service: Endoscopy;  Laterality: N/A;   COLONOSCOPY WITH PROPOFOL N/A 08/10/2020   Procedure: COLONOSCOPY WITH PROPOFOL;  Surgeon: BRobert Bellow MD;  Location: [facility];  Service: Endoscopy;  Laterality: N/A;   CORONARY STENT INTERVENTION N/A 06/28/2017   Procedure: CORONARY STENT INTERVENTION;  Surgeon: CYolonda Kida MD;  [place] [facility];  Service: Cardiovascular;  Laterality: N/A;   EYE SURGERY     lens eye surgery   HERNIA REPAIR  [id number]  umbilical   LEFT HEART CATH AND CORONARY ANGIOGRAPHY N/A 06/28/2017   Procedure: LEFT HEART CATH AND CORONARY ANGIOGRAPHY;  Surgeon: GMinna Merritts MD;  [place];  Service: Cardiovascular;  Laterality: N/A;   LUMBAR LAMINECTOMY/DECOMPRESSION MICRODISCECTOMY Bilateral 03/10/2018   Procedure: Laminectomy and Foraminotomy - Lumbar three-Lumbar four - Lumbar four-Lumbar five - bilateral;  Surgeon: Earnie Larsson, MD;  Location: [facility];  Service: Neurosurgery;  Laterality: Bilateral;   NEPHRECTOMY  1992   left- cancer   RIB RESECTION     removal 4 ribs   TONSILLECTOMY      Family History  Problem Relation Age of Onset   Colon cancer Mother    Cerebral aneurysm Father    Heart attack Father    Heart attack Brother 53   Healthy Son     Social History   Socioeconomic History   Marital status: Single    Spouse name: Not on file   Number of  children: Not on file   Years of education: Not on file   Highest education level: Not on file  Occupational History   Occupation: Metallurgist: [employer]  Tobacco Use   Smoking status: Never    Passive exposure: Never   Smokeless tobacco: Never  Vaping Use   Vaping Use: Never used  Substance and Sexual Activity   Alcohol use: No   Drug use: No   Sexual activity: Not on file  Other Topics Concern   Not on file  Social History Narrative   Not on file   Social Determinants of Health   Financial Resource Strain: Not on file  Food Insecurity: Not on file  Transportation Needs: Not on file  Physical Activity: Not on file  Stress: Not on file  Social Connections: Not on file  Intimate Partner Violence: Not on file     Outpatient Medications Prior to Visit  Medication Sig Dispense Refill   aspirin EC 81 MG tablet Take 81 mg by mouth daily. Swallow whole.     atorvastatin (LIPITOR) 80 MG tablet Take 1 tablet by mouth once daily 90 tablet 3   carvedilol (COREG) 25 MG tablet Take 1 tablet (25 mg total) by mouth 2 (two) times daily. 180 tablet 1   colchicine 0.6 MG tablet TAKE 1 TABLET BY MOUTH THREE TIMES DAILY UNTIL STOMACH UPSET OR FLARES SUBSIDES. THEN FOR MAINTENANCE TAKE 1 TABLET ONCE DAILY 90 tablet 0   esomeprazole (NEXIUM) 40 MG capsule Take 40 mg by mouth daily in the afternoon.     gabapentin (NEURONTIN) 100 MG capsule Take 100 mg by mouth 3 (three) times daily as needed.     isosorbide mononitrate (IMDUR) 30 MG 24 hr tablet Take 1 tablet (30 mg total) by mouth in the morning and at bedtime. 60 tablet 3   lidocaine (LIDODERM) 5 % Place 1 patch onto the skin daily. Remove & Discard patch within 12 hours or as directed by MD     losartan-hydrochlorothiazide (HYZAAR) 50-12.5 MG tablet Take 1/2 tablet once a day. 90 tablet 1   Multiple Vitamins-Minerals (MULTIVITAMIN WITH MINERALS) tablet Take 1 tablet by mouth daily.     nitroGLYCERIN (NITROSTAT) 0.4 MG SL  tablet Place 1 tablet (0.4 mg total) under the tongue every 5 (five) minutes x 3 doses as needed for chest pain. 25 tablet 1   ondansetron (ZOFRAN-ODT) 4 MG disintegrating tablet Take 1 tablet (4 mg total) by mouth every 8 (eight) hours as needed for nausea or vomiting. 12 tablet 0   traMADol (ULTRAM) 50 MG tablet Take 50 mg by mouth daily.      furosemide (LASIX) 20 MG tablet Take 1 tablet (20 mg total) by  mouth daily as needed. 30 tablet 1   potassium chloride (KLOR-CON) 10 MEQ tablet Take 1 tablet (10 mEq total) by mouth daily as needed (when taking furosemide). 30 tablet 1   Facility-Administered Medications Prior to Visit  Medication Dose Route Frequency Provider Last Rate Last Admin   betamethasone acetate-betamethasone sodium phosphate (CELESTONE) injection 3 mg  3 mg Intra-articular Once Evans, Brent M, DPM       dexamethasone (DECADRON) injection 4 mg  4 mg Other Once Criselda Peaches, DPM       triamcinolone acetonide (KENALOG) 10 MG/ML injection 10 mg  10 mg Other Once Criselda Peaches, DPM        Allergies  Allergen Reactions   Morphine And Related Nausea Only   Prednisone Nausea And Vomiting   Amlodipine Palpitations   Latex Rash    ROS Review of Systems  Constitutional: Negative.   HENT: Negative.    Respiratory:  Negative for chest tightness and shortness of breath.   Cardiovascular:  Negative for chest pain and palpitations.  Gastrointestinal: Negative.   Genitourinary: Negative.   Musculoskeletal: Negative.   Neurological: Negative.   Psychiatric/Behavioral: Negative.        Objective:    Physical Exam Constitutional:      Appearance: Normal appearance. She is obese.  HENT:     Head: Normocephalic.     Right Ear: Tympanic membrane normal.     Left Ear: Tympanic membrane normal.     Nose: Nose normal.     Mouth/Throat:     Mouth: Mucous membranes are moist.     Pharynx: Oropharynx is clear.  Eyes:     Extraocular Movements: Extraocular movements  intact.     Conjunctiva/sclera: Conjunctivae normal.     Pupils: Pupils are equal, round, and reactive to light.  Cardiovascular:     Rate and Rhythm: Normal rate and regular rhythm.     Pulses: Normal pulses.     Heart sounds: Normal heart sounds.  Pulmonary:     Effort: Pulmonary effort is normal. No respiratory distress.     Breath sounds: Normal breath sounds. No rhonchi.  Abdominal:     General: Bowel sounds are normal.     Palpations: Abdomen is soft. There is no mass.     Tenderness: There is no abdominal tenderness.     Hernia: No hernia is present.  Musculoskeletal:        General: Normal range of motion.     Cervical back: Neck supple. No tenderness.  Skin:    General: Skin is warm.     Capillary Refill: Capillary refill takes less than 2 seconds.  Neurological:     General: No focal deficit present.     Mental Status: She is alert and oriented to person, place, and time. Mental status is at baseline.  Psychiatric:        Mood and Affect: Mood normal.        Behavior: Behavior normal.        Thought Content: Thought content normal.        Judgment: Judgment normal.     BP 138/79   Pulse 79   Ht '5\' 10"'$  (1.778 m)   Wt 266 lb 8 oz (120.9 kg)   BMI 38.24 kg/m  Wt Readings from Last 3 Encounters:  08/09/22 266 lb 8 oz (120.9 kg)  08/08/22 266 lb 4 oz (120.8 kg)  07/29/22 260 lb (117.9 kg)     Health Maintenance  Topic  Date Due   Hepatitis C Screening  Never done   TETANUS/TDAP  Never done   Zoster Vaccines- Shingrix (1 of 2) Never done   DEXA SCAN  Never done   Pneumonia Vaccine 29+ Years old (2 - PCV) 06/29/2018   COVID-19 Vaccine (5 - Moderna series) 06/01/2021   INFLUENZA VACCINE  06/26/2022   HPV VACCINES  Aged Out   COLONOSCOPY (Pts 45-45yr Insurance coverage will need to be confirmed)  Discontinued    There are no preventive care reminders to display for this patient.  Lab Results  Component Value Date   TSH 2.592 10/22/2016   Lab Results   Component Value Date   WBC 5.8 08/27/2021   HGB 15.2 (H) 08/27/2021   HCT 43.6 08/27/2021   MCV 89.9 08/27/2021   PLT 242 08/27/2021   Lab Results  Component Value Date   NA 139 08/27/2021   K 3.9 08/27/2021   CO2 27 08/27/2021   GLUCOSE 125 (H) 08/27/2021   BUN 17 08/27/2021   CREATININE 0.99 08/27/2021   BILITOT 0.6 04/25/2021   ALKPHOS 148 (H) 06/30/2020   AST 30 04/25/2021   ALT 46 (H) 04/25/2021   PROT 6.8 04/25/2021   ALBUMIN 4.5 06/30/2020   CALCIUM 9.6 08/27/2021   ANIONGAP 7 08/27/2021   Lab Results  Component Value Date   CHOL 172 06/25/2019   Lab Results  Component Value Date   HDL 45 06/25/2019   Lab Results  Component Value Date   LDLCALC 100 (H) 06/25/2019   Lab Results  Component Value Date   TRIG 135 06/25/2019   Lab Results  Component Value Date   CHOLHDL 3.8 06/25/2019   Lab Results  Component Value Date   HGBA1C 5.8 (H) 06/27/2017      Assessment & Plan:   Problem List Items Addressed This Visit       Cardiovascular and Mediastinum   HTN (hypertension) - Primary    Advised patient to stop losartan HCTZ carvedilol 25 mg twice daily. Advised patient to monitor blood pressure and call the office with blood pressure readings.      Relevant Orders   CBC with Differential/Platelet   COMPLETE METABOLIC PANEL WITH GFR   TSH     Musculoskeletal and Integument   Gout involving toe    Stable at present. We will check check uric acid level       Relevant Orders   Uric acid     Other   Hyperlipidemia    Advised patient to watch diet, avoid fried and fatty food. We will check the lipid panel      Relevant Orders   Lipid panel   Elevated blood sugar    Patient had elevated blood sugar results. Check hemoglobin A1c      Relevant Orders   Hemoglobin A1c     No orders of the defined types were placed in this encounter.    Follow-up: No follow-ups on file.    CTheresia Lo NP

## 2022-08-09 NOTE — Assessment & Plan Note (Signed)
Advised patient to watch diet, avoid fried and fatty food. We will check the lipid panel

## 2022-08-10 LAB — COMPLETE METABOLIC PANEL WITH GFR
AG Ratio: 1.8 (calc) (ref 1.0–2.5)
ALT: 27 U/L (ref 6–29)
AST: 21 U/L (ref 10–35)
Albumin: 4.1 g/dL (ref 3.6–5.1)
Alkaline phosphatase (APISO): 126 U/L (ref 37–153)
BUN: 19 mg/dL (ref 7–25)
CO2: 25 mmol/L (ref 20–32)
Calcium: 9.7 mg/dL (ref 8.6–10.4)
Chloride: 103 mmol/L (ref 98–110)
Creat: 0.96 mg/dL (ref 0.60–1.00)
Globulin: 2.3 g/dL (calc) (ref 1.9–3.7)
Glucose, Bld: 112 mg/dL — ABNORMAL HIGH (ref 65–99)
Potassium: 4.5 mmol/L (ref 3.5–5.3)
Sodium: 141 mmol/L (ref 135–146)
Total Bilirubin: 0.8 mg/dL (ref 0.2–1.2)
Total Protein: 6.4 g/dL (ref 6.1–8.1)
eGFR: 61 mL/min/{1.73_m2} (ref >=60)

## 2022-08-10 LAB — LIPID PANEL
Cholesterol: 191 mg/dL (ref <200)
HDL: 53 mg/dL (ref >=50)
LDL Cholesterol (Calc): 116 mg/dL (calc) — ABNORMAL HIGH
Non-HDL Cholesterol (Calc): 138 mg/dL (calc) — ABNORMAL HIGH (ref <130)
Total CHOL/HDL Ratio: 3.6 (calc) (ref <5.0)
Triglycerides: 114 mg/dL (ref <150)

## 2022-08-10 LAB — CBC WITH DIFFERENTIAL/PLATELET
Absolute Monocytes: 902 cells/uL (ref 200–950)
Basophils Absolute: 74 cells/uL (ref 0–200)
Basophils Relative: 0.8 %
Eosinophils Absolute: 149 cells/uL (ref 15–500)
Eosinophils Relative: 1.6 %
HCT: 43.7 % (ref 35.0–45.0)
Hemoglobin: 14.6 g/dL (ref 11.7–15.5)
Lymphs Abs: 1907 cells/uL (ref 850–3900)
MCH: 29.7 pg (ref 27.0–33.0)
MCHC: 33.4 g/dL (ref 32.0–36.0)
MCV: 88.8 fL (ref 80.0–100.0)
MPV: 10.6 fL (ref 7.5–12.5)
Monocytes Relative: 9.7 %
Neutro Abs: 6268 cells/uL (ref 1500–7800)
Neutrophils Relative %: 67.4 %
Platelets: 306 10*3/uL (ref 140–400)
RBC: 4.92 10*6/uL (ref 3.80–5.10)
RDW: 13.2 % (ref 11.0–15.0)
Total Lymphocyte: 20.5 %
WBC: 9.3 10*3/uL (ref 3.8–10.8)

## 2022-08-10 LAB — HEMOGLOBIN A1C
Hgb A1c MFr Bld: 5.9 % of total Hgb — ABNORMAL HIGH (ref <5.7)
Mean Plasma Glucose: 123 mg/dL
eAG (mmol/L): 6.8 mmol/L

## 2022-08-10 LAB — TSH: TSH: 2.34 mIU/L (ref 0.40–4.50)

## 2022-08-10 LAB — URIC ACID: Uric Acid, Serum: 8.2 mg/dL — ABNORMAL HIGH (ref 2.5–7.0)

## 2022-08-12 NOTE — Progress Notes (Deleted)
08/12/22 7:44 PM   [place] 09/20/1946 [id number]  Referring provider:  [name], NP [address] [place],  [place] [address]  Urological history  1. Renal cell carcinoma - s/p left nephrectomy ~27 years ago for RCC - RUS 12/2021 - NED   2. Nephrolithiasis - no documented stones > 10 years - RUS on 01/22/2022 showed no stone burden    HPI: Carrie Larsen is a 76 y.o.female who presents today for a 1 year follow-up for night time frequency and urgency.  PVR ***  PMH: Past Medical History:  Diagnosis Date   Anxiety    Arthritis    osteoarthritis   CAD (coronary artery disease)    a. 2012 ETT: no ischemia;  b. 06/2017 NSTEMI/PCI: LM nl, LAD nl, D1 70-80p, LCX nl, OM1 90 (3.0 x 23 Xience [place]), RCA dominant, 100p/m, fills via L->R collats, EF 55-65%.   Cancer [facility])    a. s/p partial left nephrectomy.   Chicken pox    CME (cystoid macular edema), left 11/20/2018   Colon cancer ([place]) 1992   T3, N1, M0. colon    Colon polyp 2011   COVID-19 23/7628   Diastolic dysfunction    a. 08/2015 Echo: EF 60-65%, no rwma, Gr1 DD, midlly dil LA, PASP 8mHg. No significant valvular dzs; b. 06/2017 Echo: EF 60-65%, Gr1DD, mildly dil LA.   Essential hypertension    GERD (gastroesophageal reflux disease)    Gout    History of appendectomy 06/07/2015   History of kidney cancer 11/20/2018   History of kidney stones    passed - 2   Hyperlipidemia    Lumbar spinal stenosis    [place] ([place]    06/[place]   Nonexudative age-related macular degeneration, bilateral, early dry stage 11/20/2018   Obesity    Prediabetes    Pseudophakia of both eyes    Retinal cyst     Surgical History: Past Surgical History:  Procedure Laterality Date   ABDOMINAL HYSTERECTOMY     APPENDECTOMY  06/07/2015   CARDIAC CATHETERIZATION     CATARACT EXTRACTION W/PHACO Left 04/24/2016   Procedure: CATARACT EXTRACTION PHACO AND INTRAOCULAR LENS PLACEMENT ([place];  Surgeon: WBirder Robson  MD;  Location: [facility];  Service: Ophthalmology;  Laterality: Left;  [place]45.8 AP% 16.4 CDE 7.53 FLUID PACK LOT # [id number]   CATARACT EXTRACTION W/PHACO Right 06/05/2016   Procedure: CATARACT EXTRACTION PHACO AND INTRAOCULAR LENS PLACEMENT ([facility]);  Surgeon: WBirder Robson MD;  Location: [facility];  Service: Ophthalmology;  Laterality: Right;  [place]25.9 AP% 21.9 CDE 5.68 Fluid pack lot # [id number]   COLON RESECTION  03/04/1991   COLON SURGERY     COLONOSCOPY  03-14-10   Dr BBary Castilla tubular adenoma at 25 cm.   COLONOSCOPY WITH PROPOFOL N/A 06/07/2015   Procedure: COLONOSCOPY WITH PROPOFOL;  Surgeon: JRobert Bellow MD;  [facility];  Service: Endoscopy;  Laterality: N/A;   COLONOSCOPY WITH PROPOFOL N/A 08/10/2020   Procedure: COLONOSCOPY WITH PROPOFOL;  Surgeon: BRobert Bellow MD;  Location: [facility];  Service: Endoscopy;  Laterality: N/A;   CORONARY STENT INTERVENTION N/A 06/28/2017   Procedure: CORONARY STENT INTERVENTION;  Surgeon: CYolonda Kida MD;  [place];  Service: Cardiovascular;  Laterality: N/A;   EYE SURGERY     lens eye surgery   HERNIA REPAIR  [id number]  umbilical   LEFT HEART CATH AND CORONARY ANGIOGRAPHY N/A 06/28/2017   Procedure: LEFT HEART CATH AND CORONARY ANGIOGRAPHY;  Surgeon: Minna Merritts, MD;  Location: [place] [facility];  Service: Cardiovascular;  Laterality: N/A;   LUMBAR LAMINECTOMY/DECOMPRESSION MICRODISCECTOMY Bilateral 03/10/2018   Procedure: Laminectomy and Foraminotomy - Lumbar three-Lumbar four - Lumbar four-Lumbar five - bilateral;  Surgeon: Earnie Larsson, MD;  Location: [place];  Service: Neurosurgery;  Laterality: Bilateral;   NEPHRECTOMY  1992   left- cancer   RIB RESECTION     removal 4 ribs   TONSILLECTOMY      Home Medications:  Allergies as of 08/13/2022       Reactions   Morphine And Related Nausea Only   Prednisone Nausea And Vomiting   Amlodipine Palpitations   Latex Rash         Medication List        Accurate as of August 12, 2022  7:44 PM. If you have any questions, ask your nurse or doctor.          aspirin EC 81 MG tablet Take 81 mg by mouth daily. Swallow whole.   atorvastatin 80 MG tablet Commonly known as: LIPITOR Take 1 tablet by mouth once daily   carvedilol 25 MG tablet Commonly known as: COREG Take 1 tablet (25 mg total) by mouth 2 (two) times daily.   colchicine 0.6 MG tablet TAKE 1 TABLET BY MOUTH THREE TIMES DAILY UNTIL STOMACH UPSET OR FLARES SUBSIDES. THEN FOR MAINTENANCE TAKE 1 TABLET ONCE DAILY   esomeprazole 40 MG capsule Commonly known as: NEXIUM Take 40 mg by mouth daily in the afternoon.   furosemide 20 MG tablet Commonly known as: LASIX Take 1 tablet (20 mg total) by mouth daily as needed.   gabapentin 100 MG capsule Commonly known as: NEURONTIN Take 100 mg by mouth 3 (three) times daily as needed.   isosorbide mononitrate 30 MG 24 hr tablet Commonly known as: IMDUR Take 1 tablet (30 mg total) by mouth in the morning and at bedtime.   lidocaine 5 % Commonly known as: LIDODERM Place 1 patch onto the skin daily. Remove & Discard patch within 12 hours or as directed by MD   losartan-hydrochlorothiazide 50-12.5 MG tablet Commonly known as: HYZAAR Take 1/2 tablet once a day.   multivitamin with minerals tablet Take 1 tablet by mouth daily.   nitroGLYCERIN 0.4 MG SL tablet Commonly known as: NITROSTAT Place 1 tablet (0.4 mg total) under the tongue every 5 (five) minutes x 3 doses as needed for chest pain.   ondansetron 4 MG disintegrating tablet Commonly known as: ZOFRAN-ODT Take 1 tablet (4 mg total) by mouth every 8 (eight) hours as needed for nausea or vomiting.   potassium chloride 10 MEQ tablet Commonly known as: KLOR-CON Take 1 tablet (10 mEq total) by mouth daily as needed (when taking furosemide).   traMADol 50 MG tablet Commonly known as: ULTRAM Take 50 mg by mouth daily.        Allergies:   Allergies  Allergen Reactions   Morphine And Related Nausea Only   Prednisone Nausea And Vomiting   Amlodipine Palpitations   Latex Rash    Family History: Family History  Problem Relation Age of Onset   Colon cancer Mother    Cerebral aneurysm Father    Heart attack Father    Heart attack Brother 60   Healthy Son     Social History:  reports that she has never smoked. She has never been exposed to tobacco smoke. She has never used smokeless tobacco. She reports that she does not drink alcohol  and does not use drugs.   Physical Exam: There were no vitals taken for this visit.  Constitutional:  Well nourished. Alert and oriented, No acute distress. HEENT: [place] AT, moist mucus membranes.  Trachea midline Cardiovascular: No clubbing, cyanosis, or edema. Respiratory: Normal respiratory effort, no increased work of breathing. Neurologic: Grossly intact, no focal deficits, moving all 4 extremities. Psychiatric: Normal mood and affect.    Laboratory Data: Component     Latest Ref Rng 01/16/2022  Uric Acid     3.1 - 7.9 mg/dL 5.9   I have reviewed the labs.   Pertinent Imaging: CLINICAL DATA:  Solitary kidney.   EXAM: RENAL / URINARY TRACT ULTRASOUND COMPLETE   COMPARISON:  None.   FINDINGS: Right Kidney:   Renal measurements: 14.0 cm x 6.0 cm x 7.7 cm = volume: 337 mL. Echogenicity within normal limits. A 4.3 cm x 4.2 cm x 4.2 cm anechoic structure is seen within the lower pole of the right kidney. This is seen on the prior study and is mildly increased in size when compared to the prior exam (measured approximately 3.7 cm x 3.5 cm x 4.2 cm on the prior study) no abnormal flow is noted within this region on color Doppler evaluation. No hydronephrosis is visualized.   Left Kidney:   The left kidney is surgically absent.   Bladder:   Appears normal for degree of bladder distention.   Other:   None.   IMPRESSION: 1. Findings consistent with history of prior  left nephrectomy. 2. Simple right renal cyst.     Electronically Signed   By: Virgina Norfolk M.D.   On: 01/22/2022 21:30 I have independently reviewed the films.  See HPI.    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 Renal cyst  - Appreciated on RUS   3. Nocturia - explained her risk factors for nocturia: hypertension, diabetes, arthritis, anxiety and heart disease  - I explained to the patient that nocturia is often multi-factorial and difficult to treat.  Sleeping disorders, heart conditions, peripheral vascular disease, diabetes, an enlarged prostate for men, an urethral stricture causing bladder outlet obstruction and/or certain medications can contribute to nocturia.  - I have suggested that the patient avoid caffeine after noon and alcohol in the evening.  He or she may also benefit from fluid restrictions after 6:00 in the evening and voiding just prior to bedtime.  - I have explained that research studies have showed that over 84% of patients with sleep apnea reported frequent nighttime urination.   With sleep apnea, oxygen decreases, carbon dioxide increases, the blood become more acidic, the heart rate drops and blood vessels in the lung constrict.  The body is then alerted that something is very wrong. The sleeper must wake enough to reopen the airway. By this time, the heart is racing and experiences a false signal of fluid overload. The heart excretes a hormone-like protein that tells the body to get rid of sodium and water, resulting in nocturia.  -  I also informed the patient that a recent study noted that decreasing sodium intake to 2.3 grams daily, if they don't have issues with hyponatremia, can also reduce the number of nightly voids  - There is also an increased incidence in sleep apnea with menopause, symptoms include night sweats, daytime sleepiness, depressed mood, and cognitive complaints like poor  concentration or problems with short-term memory ***  - The  patient may benefit from a discussion with his or her primary care physician to see if he or she has risk factors for sleep apnea or other sleep disturbances and obtaining a sleep study.  No follow-ups on file.  Shatina Streets, [place] [address] [place], [place] [address] [phone]

## 2022-08-13 ENCOUNTER — Ambulatory Visit (INDEPENDENT_AMBULATORY_CARE_PROVIDER_SITE_OTHER): Payer: Medicare Other

## 2022-08-13 ENCOUNTER — Telehealth: Payer: Self-pay | Admitting: Cardiovascular Disease

## 2022-08-13 ENCOUNTER — Ambulatory Visit: Payer: Medicare Other | Admitting: Urology

## 2022-08-13 ENCOUNTER — Ambulatory Visit
Admission: EM | Admit: 2022-08-13 | Discharge: 2022-08-13 | Disposition: A | Discharge status: Home / Self Care | Payer: Medicare Other | Attending: Emergency Medicine | Admitting: Emergency Medicine

## 2022-08-13 DIAGNOSIS — R0789 Other chest pain: Secondary | ICD-10-CM

## 2022-08-13 DIAGNOSIS — R059 Cough, unspecified: Secondary | ICD-10-CM | POA: Diagnosis not present

## 2022-08-13 DIAGNOSIS — R609 Edema, unspecified: Secondary | ICD-10-CM | POA: Diagnosis present

## 2022-08-13 DIAGNOSIS — N390 Urinary tract infection, site not specified: Secondary | ICD-10-CM | POA: Insufficient documentation

## 2022-08-13 DIAGNOSIS — R351 Nocturia: Secondary | ICD-10-CM

## 2022-08-13 LAB — URINALYSIS, MICROSCOPIC (REFLEX)

## 2022-08-13 LAB — URINALYSIS, ROUTINE W REFLEX MICROSCOPIC
Glucose, UA: NEGATIVE mg/dL
Hgb urine dipstick: NEGATIVE
Ketones, ur: NEGATIVE mg/dL
Nitrite: NEGATIVE
Protein, ur: NEGATIVE mg/dL
Specific Gravity, Urine: 1.025 (ref 1.005–1.030)
pH: 5.5 (ref 5.0–8.0)

## 2022-08-13 MED ORDER — NITROFURANTOIN MONOHYD MACRO 100 MG PO CAPS: 100.0000 mg | ORAL_CAPSULE | Freq: Two times a day (BID) | ORAL | 0 refills | Status: DC

## 2022-08-13 NOTE — Telephone Encounter (Signed)
Attempted to call the patient. No answer- I left a message to please call back.  

## 2022-08-13 NOTE — ED Provider Notes (Signed)
[facility]    CSN: [id number] Arrival date & time: 08/13/22  1737      History   Chief Complaint Chief Complaint  Patient presents with   Cough   Back Pain   Foot Swelling    HPI Carrie Larsen is a 76 y.o. female.   Patient is a 76 year old female with past medical history of coronary artery disease, gout, and hypertension who presents with chief complaint of leg swelling from possible gout, some chest tightness when laying down at night, and some back discomfort.  Patient was seen by her primary care and cardiology both last week.  Per the cardiology note, patient has reported similar lower extremity edema chest tightness with laying flat, and some back discomfort in the past that was relieved after taking some Lasix.  Cardiology note last week has no noted edema on the physical exam.  Patient's home medication includes colchicine daily, Lasix as needed as well as losartan with hydrochlorothiazide 12.5 mg that is taken daily.  Patient states she noted swelling to 3 days ago.    Past Medical History:  Diagnosis Date   Anxiety    Arthritis    osteoarthritis   CAD (coronary artery disease)    a. 2012 ETT: no ischemia;  b. 06/2017 NSTEMI/PCI: LM nl, LAD nl, D1 70-80p, LCX nl, OM1 90 (3.0 x 23 Xience [place]), RCA dominant, 100p/m, fills via L->R collats, EF 55-65%.   Cancer [facility])    a. s/p partial left nephrectomy.   Chicken pox    CME (cystoid macular edema), left 11/20/2018   Colon cancer ([place]) 1992   T3, N1, M0. colon    Colon polyp 2011   COVID-19 63/8466   Diastolic dysfunction    a. 08/2015 Echo: EF 60-65%, no rwma, Gr1 DD, midlly dil LA, PASP 66mHg. No significant valvular dzs; b. 06/2017 Echo: EF 60-65%, Gr1DD, mildly dil LA.   Essential hypertension    GERD (gastroesophageal reflux disease)    Gout    History of appendectomy 06/07/2015   History of kidney cancer 11/20/2018   History of kidney stones    passed - 2   Hyperlipidemia    Lumbar spinal  stenosis    Myocardial infarction ([place]    06/2017   Nonexudative age-related macular degeneration, bilateral, early dry stage 11/20/2018   Obesity    Prediabetes    Pseudophakia of both eyes    Retinal cyst     Patient Active Problem List   Diagnosis Date Noted   Elevated blood sugar 08/09/2022   Gout involving toe 08/09/2022   Coronary artery disease of native artery of native heart with stable angina pectoris ([place] 07/26/2022   Class 2 severe obesity with serious comorbidity and body mass index (BMI) of 38.0 to 38.9 in adult ([place] 07/26/2022   Pain in left lower leg 07/26/2022   Personal history of kidney stones 12/25/2019   Choroidal nevus, 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   CME (cystoid macular edema), left 11/20/2018   Nonexudative age-related macular degeneration, bilateral, early dry stage 11/20/2018   Pseudophakia of both eyes 11/20/2018   Olecranon bursitis 10/16/2018   Contusion of knee 10/16/2018   Arthritis of knee 10/16/2018   Arthritis of hand 10/16/2018   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/10/2018   Morbid obesity ([place] 09/11/2017   Non-STEMI (non-ST elevated myocardial infarction) ([place] 06/27/2017   Abdominal pain 02/28/2017   History of renal cell carcinoma 02/20/2017   Knee pain 08/14/2016   Strain of hamstring  muscle 08/08/2016   Left leg swelling 68/01/2121   Systolic murmur    Retrosternal chest pain 07/08/2015   History of colonic polyps 04/16/2015   Chest pain 03/08/2011   Hyperlipidemia 03/08/2011   HTN (hypertension) 03/08/2011    Past Surgical History:  Procedure Laterality Date   ABDOMINAL HYSTERECTOMY     APPENDECTOMY  06/07/2015   CARDIAC CATHETERIZATION     CATARACT EXTRACTION W/PHACO Left 04/24/2016   Procedure: CATARACT EXTRACTION PHACO AND INTRAOCULAR LENS PLACEMENT ([place]);  Surgeon: Birder Robson, MD;  Location: [facility] ORS;  Service: Ophthalmology;  Laterality: Left;  [place] 45.8 AP% 16.4 CDE 7.53 FLUID PACK LOT # [id number]   CATARACT  EXTRACTION W/PHACO Right 06/05/2016   Procedure: CATARACT EXTRACTION PHACO AND INTRAOCULAR LENS PLACEMENT (I[facility]);  Surgeon: Birder Robson, MD;  Location: [facility] ORS;  Service: Ophthalmology;  Laterality: Right;  [place] 25.9 AP% 21.9 CDE 5.68 Fluid pack lot # [id number]   COLON RESECTION  03/04/1991   COLON SURGERY     COLONOSCOPY  03-14-10   Dr Bary Castilla, tubular adenoma at 25 cm.   COLONOSCOPY WITH PROPOFOL N/A 06/07/2015   Procedure: COLONOSCOPY WITH PROPOFOL;  Surgeon: Robert Bellow, MD;  Location: [facility] ENDOSCOPY;  Service: Endoscopy;  Laterality: N/A;   COLONOSCOPY WITH PROPOFOL N/A 08/10/2020   Procedure: COLONOSCOPY WITH PROPOFOL;  Surgeon: Robert Bellow, MD;  Location: [facility] ENDOSCOPY;  Service: Endoscopy;  Laterality: N/A;   CORONARY STENT INTERVENTION N/A 06/28/2017   Procedure: CORONARY STENT INTERVENTION;  Surgeon: Yolonda Kida, MD;  Location: [place] CV LAB;  Service: Cardiovascular;  Laterality: N/A;   EYE SURGERY     lens eye surgery   HERNIA REPAIR  1694   umbilical   LEFT HEART CATH AND CORONARY ANGIOGRAPHY N/A 06/28/2017   Procedure: LEFT HEART CATH AND CORONARY ANGIOGRAPHY;  Surgeon: Minna Merritts, MD;  Location: [place] [facility];  Service: Cardiovascular;  Laterality: N/A;   LUMBAR LAMINECTOMY/DECOMPRESSION MICRODISCECTOMY Bilateral 03/10/2018   Procedure: Laminectomy and Foraminotomy - Lumbar three-Lumbar four - Lumbar four-Lumbar five - bilateral;  Surgeon: Earnie Larsson, MD;  Location: [place];  Service: Neurosurgery;  Laterality: Bilateral;   NEPHRECTOMY  1992   left- cancer   RIB RESECTION     removal 4 ribs   TONSILLECTOMY      OB History     Gravida  1   Para  1   Term      Preterm      AB      Living  1      SAB      IAB      Ectopic      Multiple      Live Births           Obstetric Comments  1st Menstrual Cycle:  15 1st Pregnancy:  25          Home Medications    Prior to Admission medications   Medication Sig  Start Date End Date Taking? Authorizing Provider  aspirin EC 81 MG tablet Take 81 mg by mouth daily. Swallow whole.    [provider]  atorvastatin (LIPITOR) 80 MG tablet Take 1 tablet by mouth once daily 07/25/21   Minna Merritts, MD  carvedilol (COREG) 25 MG tablet Take 1 tablet (25 mg total) by mouth 2 (two) times daily. 08/08/22   Furth, Cadence H, PA-C  colchicine 0.6 MG tablet TAKE 1 TABLET BY MOUTH THREE TIMES DAILY UNTIL STOMACH UPSET OR FLARES SUBSIDES.  THEN FOR MAINTENANCE TAKE 1 TABLET ONCE DAILY 06/21/22   Edrick Kins, DPM  esomeprazole (NEXIUM) 40 MG capsule Take 40 mg by mouth daily in the afternoon.    [provider]  furosemide (LASIX) 20 MG tablet Take 1 tablet (20 mg total) by mouth daily as needed. 05/28/22 08/08/22  Minna Merritts, MD  gabapentin (NEURONTIN) 100 MG capsule Take 100 mg by mouth 3 (three) times daily as needed. 03/27/21   [provider]  isosorbide mononitrate (IMDUR) 30 MG 24 hr tablet Take 1 tablet (30 mg total) by mouth in the morning and at bedtime. 07/02/22   Minna Merritts, MD  lidocaine (LIDODERM) 5 % Place 1 patch onto the skin daily. Remove & Discard patch within 12 hours or as directed by MD    [provider]  losartan-hydrochlorothiazide (HYZAAR) 50-12.5 MG tablet Take 1/2 tablet once a day. 07/26/22   [name], NP  Multiple Vitamins-Minerals (MULTIVITAMIN WITH MINERALS) tablet Take 1 tablet by mouth daily.    [provider]  nitroGLYCERIN (NITROSTAT) 0.4 MG SL tablet Place 1 tablet (0.4 mg total) under the tongue every 5 (five) minutes x 3 doses as needed for chest pain. 10/12/20   Minna Merritts, MD  ondansetron (ZOFRAN-ODT) 4 MG disintegrating tablet Take 1 tablet (4 mg total) by mouth every 8 (eight) hours as needed for nausea or vomiting. 07/29/22   Johnn Hai, PA-C  potassium chloride (KLOR-CON) 10 MEQ tablet Take 1 tablet (10 mEq total) by mouth daily as needed (when taking  furosemide). 05/28/22 08/08/22  Minna Merritts, MD  traMADol (ULTRAM) 50 MG tablet Take 50 mg by mouth daily.  05/18/19   [provider]    Family History Family History  Problem Relation Age of Onset   Colon cancer Mother    Cerebral aneurysm Father    Heart attack Father    Heart attack Brother 76   Healthy Son     Social History Social History   Tobacco Use   Smoking status: Never    Passive exposure: Never   Smokeless tobacco: Never  Vaping Use   Vaping Use: Never used  Substance Use Topics   Alcohol use: No   Drug use: No     Allergies   Morphine and related, Prednisone, Amlodipine, and Latex   Review of Systems Review of Systems as noted above in HPI.  Other systems reviewed and found to be negative   Physical Exam Triage Vital Signs ED Triage Vitals [08/13/22 1829]  Enc Vitals Group     BP (!) 182/85     Pulse Rate 76     Resp      Temp 97.8 F (36.6 C)     Temp Source Oral     SpO2 92 %     Weight 265 lb (120.2 kg)     Height '5\' 10"'$  (1.778 m)     Head Circumference      Peak Flow      Pain Score 5     Pain Loc      Pain Edu?      Excl. in [place]?    No data found.  Updated Vital Signs BP (!) 182/85 (BP Location: Left Arm)   Pulse 76   Temp 97.8 F (36.6 C) (Oral)   Ht '5\' 10"'$  (1.778 m)   Wt 265 lb (120.2 kg)   SpO2 97%   BMI 38.02 kg/m   Visual Acuity Right Eye  Distance:   Left Eye Distance:   Bilateral Distance:    Right Eye Near:   Left Eye Near:    Bilateral Near:     Physical Exam Constitutional:      Appearance: Normal appearance.  HENT:     Head: Normocephalic and atraumatic.  Cardiovascular:     Rate and Rhythm: Normal rate and regular rhythm.     Pulses: Normal pulses.  Musculoskeletal:     Right lower leg: Edema (to level of knee, 2-3+) present.     Left lower leg: Edema (to level of knee, 2-3+) present.       Feet:  Neurological:     Mental Status: She is alert.      UC Treatments / Results   Labs (all labs ordered are listed, but only abnormal results are displayed) Labs Reviewed  URINALYSIS, ROUTINE W REFLEX MICROSCOPIC - Abnormal; Notable for the following components:      Result Value   APPearance HAZY (*)    Bilirubin Urine SMALL (*)    Leukocytes,Ua MODERATE (*)    All other components within normal limits  URINALYSIS, MICROSCOPIC (REFLEX) - Abnormal; Notable for the following components:   Bacteria, UA FEW (*)    All other components within normal limits    EKG   Radiology No results found.  Procedures Procedures (including critical care time)  Medications Ordered in UC Medications - No data to display  Initial Impression / Assessment and Plan / UC Course  I have reviewed the triage vital signs and the nursing notes.  Pertinent labs & imaging results that were available during my care of the patient were reviewed by me and considered in my medical decision making (see chart for details).    Patient presents with mild chest tightness at night when she is laying down, some minimal back discomfort, and some bilateral lower extremity edema that she noted 2-3 days ago.  Patient has some redness on a bunion on the right foot but no believe this is gout as there is no fever or tenderness to palpation.  Could be rubbing on her foot related to the swelling.  Urinalysis done which reveals moderate leukocyte esterase few bacteria and 21-50 bacteria.  Mucus was present but no crystals reported.  Reading through her cardiology note from last week, patient has had similar presentation of these symptoms in the past that resolved with her taking Lasix.  We will check an x-ray as well.  Chest x-ray negative for any acute process.  Given her history of the chest tightness with the foot swelling, we will go and have her take her Lasix twice daily for 2 days as that improved her symptoms in the past.  She does have a UTI based on the urinalysis.  We will send it for culture and  give her prescription for Macrobid.  No crystals noted in her urinalysis so less likely that this redness in her foot is related to gout as the exam is benign except for the erythema.  We will have patient follow-up with her primary care, at least sending him a chart message regarding today. Final Clinical Impressions(s) / UC Diagnoses   Final diagnoses:  None   Discharge Instructions   None    ED Prescriptions   None    PDMP not reviewed this encounter.   Luvenia Redden, PA-C 08/13/22 1922

## 2022-08-13 NOTE — ED Triage Notes (Addendum)
Pt c/o cough, aching in back, tightness in chest, also c/o RT foot swelling, pt states she does have hx of gout and thinks this might be a flare up. Pt requesting to have her urine checked

## 2022-08-13 NOTE — Telephone Encounter (Signed)
Left voicemail message to call back  

## 2022-08-13 NOTE — Telephone Encounter (Signed)
I spoke with the patient. She was very tearful on the phone, stating that she is having swelling/ pain to her right foot & ankle.  She also woke up last night with aching under her right breast. She is having discomfort that is constant between her shoulder blades. She advised her BP is "up and down." She saw her PCP on 08/09/22, after seeing our office on 08/08/22, and advised her blood pressure medication was stopped.   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   The patient states she has had gout flare up before, but the right foot pain/ swelling feels different today.   In reviewing her chart, she was in the ER on 07/29/22 for a mechanical fall while getting out of her car.   I have advised the patient with her multiple complaints and how tearful she is right now with her symptoms, that she be evaluated in an Urgent Care setting. She inquired if she could go home and eat and rest and go tomorrow on her off day. I advised her that I could not recommend that considering how uncomfortable she seems right now.  The patient voices understanding and advised she will turn around go to the [facility].  She is aware if she is evaluated there and symptoms are concerning, she may be transferred to the ER from Urgent Care.   The patient voices understanding and is agreeable.  She was very appreciative of the call.

## 2022-08-13 NOTE — Telephone Encounter (Signed)
Patient was just here a few days ago and all of symptoms(cough, back pain, and leg swelling) have escalated badly. New symptom is pain underneath her right breast.   Pt c/o swelling: STAT is pt has developed SOB within 24 hours  If swelling, where is the swelling located? Swelling on right foot has now gone past her ankle. States she has a huge knot at her ankle.   How much weight have you gained and in what time span? Doesn't know she hasn't checked   Have you gained 3 pounds in a day or 5 pounds in a week? unsure  Do you have a log of your daily weights (if so, list)? unsure  Are you currently taking a fluid pill? no  Are you currently SOB? Only when she lays down  Have you traveled recently? no

## 2022-08-13 NOTE — Discharge Instructions (Addendum)
-  Macrobid: 1 tablet twice a day for 5 days.  We will send the urine for culture and if we need to make any changes to your antibiotics we will give you a call. -In the setting of your feet swelling, this seems similar to presentation you had in the past based on reading your cardiologist note.  Would recommend taking your Lasix 1 tablet twice a day for 2 days to help alleviate the swelling in your legs which should help with the tightness in the back discomfort as it did before. -We will follow-up with your primary care provider via MyChart message or calling to advise them of today's visit.  We will also follow-up with them if you have no improvement in your swelling and chest tightness after the 2 days of Lasix.

## 2022-08-14 ENCOUNTER — Telehealth: Payer: Self-pay | Admitting: Cardiovascular Disease

## 2022-08-14 LAB — URINE CULTURE: Culture: NO GROWTH

## 2022-08-14 NOTE — Telephone Encounter (Signed)
Patient called wanting to speak with nurse Nira Conn.  She wanted to tell her she is glad she sent her to Urgent Care.  Patient said she has a UTI complicated by a kidney stone.

## 2022-08-14 NOTE — Telephone Encounter (Signed)
I called and spoke with the patient. She advised she was calling to thank me for telling her to go to Urgent Care last night as she has a really bad UTI. She was not able to pick up her antibiotic last night due to the pharmacy closing, but she is in line at [facility] to pick this up now.   She is going to be taking Macrobid and was told to "double up" on her diuretic for her lower extremity swelling.   She will monitor her symptoms and call us if needed.  The patient is scheduled for an echo on 09/04/22.

## 2022-08-15 ENCOUNTER — Other Ambulatory Visit: Payer: Self-pay

## 2022-08-15 ENCOUNTER — Telehealth: Payer: Self-pay | Admitting: *Deleted

## 2022-08-15 ENCOUNTER — Telehealth: Payer: Self-pay | Admitting: Urology

## 2022-08-15 DIAGNOSIS — N200 Calculus of kidney: Secondary | ICD-10-CM

## 2022-08-15 NOTE — Telephone Encounter (Signed)
Patient is calling to ask for recommendations on pain and treating foot prior to upcoming appointment. Spoke with patient and explained that she has to be seen so that we know exactly what we are treating.  We do not know without evaluating what is causing her pain,suggesting  that if it continues to go to Urgent or ER, verbalized understanding and said she will wait rather than go to ER,wait time is too long.

## 2022-08-15 NOTE — Telephone Encounter (Signed)
KUB orders placed.

## 2022-08-15 NOTE — Progress Notes (Deleted)
08/15/22 12:37 PM   [place] 09-02-1946 [id number]  Referring provider:  [name], NP [address] [place],  [place] [address]  Urological history  1. Renal cell carcinoma - s/p left nephrectomy ~27 years ago for RCC - RUS 12/2021 - NED   2. Nephrolithiasis - no documented stones > 10 years - RUS on 01/22/2022 showed no stone burden    HPI: Carrie Larsen is a 76 y.o.female who presents today for a 1 year follow-up for night time frequency and urgency.  She is also experiencing right-sided flank pain.   PVR ***  PMH: Past Medical History:  Diagnosis Date   Anxiety    Arthritis    osteoarthritis   CAD (coronary artery disease)    a. 2012 ETT: no ischemia;  b. 06/2017 NSTEMI/PCI: LM nl, LAD nl, D1 70-80p, LCX nl, OM1 90 (3.0 x 23 Xience [place]), RCA dominant, 100p/m, fills via L->R collats, EF 55-65%.   Cancer [facility])    a. s/p partial left nephrectomy.   Chicken pox    CME (cystoid macular edema), left 11/20/2018   Colon cancer ([place]) 1992   T3, N1, M0. colon    Colon polyp 2011   COVID-19 17/4081   Diastolic dysfunction    a. 08/2015 Echo: EF 60-65%, no rwma, Gr1 DD, midlly dil LA, PASP 75mHg. No significant valvular dzs; b. 06/2017 Echo: EF 60-65%, Gr1DD, mildly dil LA.   Essential hypertension    GERD (gastroesophageal reflux disease)    Gout    History of appendectomy 06/07/2015   History of kidney cancer 11/20/2018   History of kidney stones    passed - 2   Hyperlipidemia    Lumbar spinal stenosis    Myocardial infarction ([place]    06/2017   Nonexudative age-related macular degeneration, bilateral, early dry stage 11/20/2018   Obesity    Prediabetes    Pseudophakia of both eyes    Retinal cyst     Surgical History: Past Surgical History:  Procedure Laterality Date   ABDOMINAL HYSTERECTOMY     APPENDECTOMY  06/07/2015   CARDIAC CATHETERIZATION     CATARACT EXTRACTION W/PHACO Left 04/24/2016   Procedure: CATARACT EXTRACTION PHACO AND  INTRAOCULAR LENS PLACEMENT ([place];  Surgeon: WBirder Robson MD;  Location: [facility];  Service: Ophthalmology;  Laterality: Left;  [place]45.[place] AP% 16.4 CDE 7.53 FLUID PACK LOT # [id number]   CATARACT EXTRACTION W/PHACO Right 06/05/2016   Procedure: CATARACT EXTRACTION PHACO AND INTRAOCULAR LENS PLACEMENT ([facility]);  Surgeon: WBirder Robson MD;  Location: [facility];  Service: Ophthalmology;  Laterality: Right;  [place]25.[place] AP% 21.9 CDE 5.68 Fluid pack lot # [id number]   COLON RESECTION  03/04/1991   COLON SURGERY     COLONOSCOPY  03-14-10   Dr BBary Castilla tubular adenoma at 25 cm.   COLONOSCOPY WITH PROPOFOL N/A 06/07/2015   Procedure: COLONOSCOPY WITH PROPOFOL;  Surgeon: JRobert Bellow MD;  [facility];  Service: Endoscopy;  Laterality: N/A;   COLONOSCOPY WITH PROPOFOL N/A 08/10/2020   Procedure: COLONOSCOPY WITH PROPOFOL;  Surgeon: BRobert Bellow MD;  Location: [facility];  Service: Endoscopy;  Laterality: N/A;   CORONARY STENT INTERVENTION N/A 06/28/2017   Procedure: CORONARY STENT INTERVENTION;  Surgeon: CYolonda Kida MD;  [facility];  Service: Cardiovascular;  Laterality: N/A;   EYE SURGERY     lens eye surgery   HERNIA REPAIR  [id number]  umbilical   LEFT HEART CATH AND CORONARY ANGIOGRAPHY N/A 06/28/2017  Procedure: LEFT HEART CATH AND CORONARY ANGIOGRAPHY;  Surgeon: Minna Merritts, MD;  Location: [place] [facility] LAB;  Service: Cardiovascular;  Laterality: N/A;   LUMBAR LAMINECTOMY/DECOMPRESSION MICRODISCECTOMY Bilateral 03/10/2018   Procedure: Laminectomy and Foraminotomy - Lumbar three-Lumbar four - Lumbar four-Lumbar five - bilateral;  Surgeon: Earnie Larsson, MD;  Location: [place];  Service: Neurosurgery;  Laterality: Bilateral;   NEPHRECTOMY  1992   left- cancer   RIB RESECTION     removal 4 ribs   TONSILLECTOMY      Home Medications:  Allergies as of 08/16/2022       Reactions   Morphine And Related Nausea Only   Prednisone Nausea And Vomiting    Amlodipine Palpitations   Latex Rash        Medication List        Accurate as of August 15, 2022 12:37 PM. If you have any questions, ask your nurse or doctor.          aspirin EC 81 MG tablet Take 81 mg by mouth daily. Swallow whole.   atorvastatin 80 MG tablet Commonly known as: LIPITOR Take 1 tablet by mouth once daily   carvedilol 25 MG tablet Commonly known as: COREG Take 1 tablet (25 mg total) by mouth 2 (two) times daily.   colchicine 0.6 MG tablet TAKE 1 TABLET BY MOUTH THREE TIMES DAILY UNTIL STOMACH UPSET OR FLARES SUBSIDES. THEN FOR MAINTENANCE TAKE 1 TABLET ONCE DAILY   esomeprazole 40 MG capsule Commonly known as: NEXIUM Take 40 mg by mouth daily in the afternoon.   furosemide 20 MG tablet Commonly known as: LASIX Take 1 tablet (20 mg total) by mouth daily as needed.   gabapentin 100 MG capsule Commonly known as: NEURONTIN Take 100 mg by mouth 3 (three) times daily as needed.   isosorbide mononitrate 30 MG 24 hr tablet Commonly known as: IMDUR Take 1 tablet (30 mg total) by mouth in the morning and at bedtime.   lidocaine 5 % Commonly known as: LIDODERM Place 1 patch onto the skin daily. Remove & Discard patch within 12 hours or as directed by MD   losartan-hydrochlorothiazide 50-12.5 MG tablet Commonly known as: HYZAAR Take 1/2 tablet once a day.   multivitamin with minerals tablet Take 1 tablet by mouth daily.   nitrofurantoin (macrocrystal-monohydrate) 100 MG capsule Commonly known as: MACROBID Take 1 capsule (100 mg total) by mouth 2 (two) times daily.   nitroGLYCERIN 0.4 MG SL tablet Commonly known as: NITROSTAT Place 1 tablet (0.4 mg total) under the tongue every 5 (five) minutes x 3 doses as needed for chest pain.   ondansetron 4 MG disintegrating tablet Commonly known as: ZOFRAN-ODT Take 1 tablet (4 mg total) by mouth every 8 (eight) hours as needed for nausea or vomiting.   potassium chloride 10 MEQ tablet Commonly  known as: KLOR-CON Take 1 tablet (10 mEq total) by mouth daily as needed (when taking furosemide).   traMADol 50 MG tablet Commonly known as: ULTRAM Take 50 mg by mouth daily.        Allergies:  Allergies  Allergen Reactions   Morphine And Related Nausea Only   Prednisone Nausea And Vomiting   Amlodipine Palpitations   Latex Rash    Family History: Family History  Problem Relation Age of Onset   Colon cancer Mother    Cerebral aneurysm Father    Heart attack Father    Heart attack Brother 5   Healthy Son     Social  History:  reports that she has never smoked. She has never been exposed to tobacco smoke. She has never used smokeless tobacco. She reports that she does not drink alcohol and does not use drugs.   Physical Exam: There were no vitals taken for this visit.  Constitutional:  Well nourished. Alert and oriented, No acute distress. HEENT: [place] AT, moist mucus membranes.  Trachea midline, no masses. Cardiovascular: No clubbing, cyanosis, or edema. Respiratory: Normal respiratory effort, no increased work of breathing. GI: Abdomen is soft, non tender, non distended, no abdominal masses. Liver and spleen not palpable.  No hernias appreciated.  Stool sample for occult testing is not indicated.   GU: No CVA tenderness.  No bladder fullness or masses.  *** external genitalia, *** pubic hair distribution, no lesions.  Normal urethral meatus, no lesions, no prolapse, no discharge.   No urethral masses, tenderness and/or tenderness. No bladder fullness, tenderness or masses. *** vagina mucosa, *** estrogen effect, no discharge, no lesions, *** pelvic support, *** cystocele and *** rectocele noted.  No cervical motion tenderness.  Uterus is freely mobile and non-fixed.  No adnexal/parametria masses or tenderness noted.  Anus and perineum are without rashes or lesions.   ***  Skin: No rashes, bruises or suspicious lesions. Lymph: No cervical or inguinal adenopathy. Neurologic:  Grossly intact, no focal deficits, moving all 4 extremities. Psychiatric: Normal mood and affect.    Laboratory Data:    Latest Ref Rng & Units 08/09/2022   10:34 AM 08/27/2021    5:11 AM 05/06/2021    7:33 AM  CBC  WBC 3.8 - 10.8 Thousand/uL 9.3  5.8  7.5   Hemoglobin 11.7 - 15.5 g/dL 14.6  15.2  15.1   Hematocrit 35.0 - 45.0 % 43.7  43.6  45.3   Platelets 140 - 400 Thousand/uL 306  242  250       Latest Ref Rng & Units 08/09/2022   10:34 AM 08/27/2021    5:11 AM 05/06/2021    7:33 AM  CMP  Glucose 65 - 99 mg/dL 112  125  123   BUN 7 - 25 mg/dL '19  17  16   '$ Creatinine 0.60 - 1.00 mg/dL 0.96  0.99  0.96   Sodium 135 - 146 mmol/L 141  139  141   Potassium 3.5 - 5.3 mmol/L 4.5  3.9  4.4   Chloride 98 - 110 mmol/L 103  105  109   CO2 20 - 32 mmol/L '25  27  26   '$ Calcium 8.6 - 10.4 mg/dL 9.7  9.6  9.9   Total Protein 6.1 - 8.1 g/dL 6.4     Total Bilirubin 0.2 - 1.2 mg/dL 0.8     AST 10 - 35 U/L 21     ALT 6 - 29 U/L 27       Component     Latest Ref Rng 08/09/2022  Hemoglobin A1C     <5.7 % of total Hgb 5.9 (H)   Mean Plasma Glucose     mg/dL 123   eAG (mmol/L)     mmol/L 6.8     Legend: (H) High  Component     Latest Ref Rng 08/09/2022  Uric Acid, Serum     2.5 - 7.0 mg/dL 8.2 (H)     Legend: (H) High I have reviewed the labs.   Pertinent Imaging: I have independently reviewed the films.  See HPI.    Assessment & Plan:    1.  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 Renal cyst  - Appreciated on RUS   4. Nocturia - explained her risk factors for nocturia: hypertension, diabetes, arthritis, anxiety and heart disease  - I explained to the patient that nocturia is often multi-factorial and difficult to treat.  Sleeping disorders, heart conditions, peripheral vascular disease, diabetes, an enlarged prostate for men, an urethral stricture causing  bladder outlet obstruction and/or certain medications can contribute to nocturia.  - I have suggested that the patient avoid caffeine after noon and alcohol in the evening.  He or she may also benefit from fluid restrictions after 6:00 in the evening and voiding just prior to bedtime.  - I have explained that research studies have showed that over 84% of patients with sleep apnea reported frequent nighttime urination.   With sleep apnea, oxygen decreases, carbon dioxide increases, the blood become more acidic, the heart rate drops and blood vessels in the lung constrict.  The body is then alerted that something is very wrong. The sleeper must wake enough to reopen the airway. By this time, the heart is racing and experiences a false signal of fluid overload. The heart excretes a hormone-like protein that tells the body to get rid of sodium and water, resulting in nocturia.  -  I also informed the patient that a recent study noted that decreasing sodium intake to 2.3 grams daily, if they don't have issues with hyponatremia, can also reduce the number of nightly voids  - There is also an increased incidence in sleep apnea with menopause, symptoms include night sweats, daytime sleepiness, depressed mood, and cognitive complaints like poor concentration or problems with short-term memory ***  - The patient may benefit from a discussion with his or her primary care physician to see if he or she has risk factors for sleep apnea or other sleep disturbances and obtaining a sleep study.   No follow-ups on file.  Maame Dack, [place] [address] [place], [place] [address] [phone]

## 2022-08-15 NOTE — Telephone Encounter (Signed)
Patient called the triage line today with a complaint of low back pain.  She only has one kidney and says that she has a history of kidney stones.  Patient feels like this could be a stone.   Patient is scheduled to see Zara Council tomorrow at 2:30pm and is requesting an xray prior to her appointment.  Please place order for KUB.

## 2022-08-16 ENCOUNTER — Ambulatory Visit: Payer: Medicare Other | Admitting: Urology

## 2022-08-16 DIAGNOSIS — N281 Cyst of kidney, acquired: Secondary | ICD-10-CM

## 2022-08-16 DIAGNOSIS — R109 Unspecified abdominal pain: Secondary | ICD-10-CM

## 2022-08-16 DIAGNOSIS — Z905 Acquired absence of kidney: Secondary | ICD-10-CM

## 2022-08-20 NOTE — Progress Notes (Deleted)
08/20/22 8:34 AM   [name] 27-Apr-1946 [id number]  Referring provider:  [name], NP [address] [place],  [place] [address]  Urological history  1. Renal cell carcinoma - s/p left nephrectomy ~27 years ago for RCC - RUS 12/2021 - NED   2. Nephrolithiasis - no documented stones > 10 years - RUS on 01/22/2022 showed no stone burden    HPI: Carrie Larsen is a 76 y.o.female who presents today for a 1 year follow-up for night time frequency and urgency.  She is also experiencing right-sided flank pain.  UA ***  KUB ***  PVR ***  PMH: Past Medical History:  Diagnosis Date   Anxiety    Arthritis    osteoarthritis   CAD (coronary artery disease)    a. 2012 ETT: no ischemia;  b. 06/2017 NSTEMI/PCI: LM nl, LAD nl, D1 70-80p, LCX nl, OM1 90 (3.0 x 23 Xience [place]), RCA dominant, 100p/m, fills via L->R collats, EF 55-65%.   Cancer [facility])    a. s/p partial left nephrectomy.   Chicken pox    CME (cystoid macular edema), left 11/20/2018   Colon cancer ([place]) 1992   T3, N1, M0. colon    Colon polyp 2011   COVID-19 32/6712   Diastolic dysfunction    a. 08/2015 Echo: EF 60-65%, no rwma, Gr1 DD, midlly dil LA, PASP 24mHg. No significant valvular dzs; b. 06/2017 Echo: EF 60-65%, Gr1DD, mildly dil LA.   Essential hypertension    GERD (gastroesophageal reflux disease)    Gout    History of appendectomy 06/07/2015   History of kidney cancer 11/20/2018   History of kidney stones    passed - 2   Hyperlipidemia    Lumbar spinal stenosis    Myocardial infarction ([facility]    06/2017   Nonexudative age-related macular degeneration, bilateral, early dry stage 11/20/2018   Obesity    Prediabetes    Pseudophakia of both eyes    Retinal cyst     Surgical History: Past Surgical History:  Procedure Laterality Date   ABDOMINAL HYSTERECTOMY     APPENDECTOMY  06/07/2015   CARDIAC CATHETERIZATION     CATARACT EXTRACTION W/PHACO Left 04/24/2016   Procedure: CATARACT EXTRACTION  PHACO AND INTRAOCULAR LENS PLACEMENT ([place];  Surgeon: WBirder Robson MD;  Location: [facility];  Service: Ophthalmology;  Laterality: Left;  [place]45.[place] AP% 16.4 CDE 7.53 FLUID PACK LOT # [id number]   CATARACT EXTRACTION W/PHACO Right 06/05/2016   Procedure: CATARACT EXTRACTION PHACO AND INTRAOCULAR LENS PLACEMENT ([facility]);  Surgeon: WBirder Robson MD;  Location: [facility];  Service: Ophthalmology;  Laterality: Right;  [place]25.9 AP% 21.9 CDE 5.68 Fluid pack lot # [id number]   COLON RESECTION  03/04/1991   COLON SURGERY     COLONOSCOPY  03-14-10   Dr BBary Castilla tubular adenoma at 25 cm.   COLONOSCOPY WITH PROPOFOL N/A 06/07/2015   Procedure: COLONOSCOPY WITH PROPOFOL;  Surgeon: JRobert Bellow MD;  [facility];  Service: Endoscopy;  Laterality: N/A;   COLONOSCOPY WITH PROPOFOL N/A 08/10/2020   Procedure: COLONOSCOPY WITH PROPOFOL;  Surgeon: BRobert Bellow MD;  Location: [facility];  Service: Endoscopy;  Laterality: N/A;   CORONARY STENT INTERVENTION N/A 06/28/2017   Procedure: CORONARY STENT INTERVENTION;  Surgeon: CYolonda Kida MD;  [facility];  Service: Cardiovascular;  Laterality: N/A;   EYE SURGERY     lens eye surgery   HERNIA REPAIR  [id number]  umbilical   LEFT HEART CATH  AND CORONARY ANGIOGRAPHY N/A 06/28/2017   Procedure: LEFT HEART CATH AND CORONARY ANGIOGRAPHY;  Surgeon: Minna Merritts, MD;  Location: [place] [facility];  Service: Cardiovascular;  Laterality: N/A;   LUMBAR LAMINECTOMY/DECOMPRESSION MICRODISCECTOMY Bilateral 03/10/2018   Procedure: Laminectomy and Foraminotomy - Lumbar three-Lumbar four - Lumbar four-Lumbar five - bilateral;  Surgeon: Earnie Larsson, MD;  Location: [place];  Service: Neurosurgery;  Laterality: Bilateral;   NEPHRECTOMY  1992   left- cancer   RIB RESECTION     removal 4 ribs   TONSILLECTOMY      Home Medications:  Allergies as of 08/21/2022       Reactions   Morphine And Related Nausea Only   Prednisone Nausea And  Vomiting   Amlodipine Palpitations   Latex Rash        Medication List        Accurate as of August 20, 2022  8:34 AM. If you have any questions, ask your nurse or doctor.          aspirin EC 81 MG tablet Take 81 mg by mouth daily. Swallow whole.   atorvastatin 80 MG tablet Commonly known as: LIPITOR Take 1 tablet by mouth once daily   carvedilol 25 MG tablet Commonly known as: COREG Take 1 tablet (25 mg total) by mouth 2 (two) times daily.   colchicine 0.6 MG tablet TAKE 1 TABLET BY MOUTH THREE TIMES DAILY UNTIL STOMACH UPSET OR FLARES SUBSIDES. THEN FOR MAINTENANCE TAKE 1 TABLET ONCE DAILY   esomeprazole 40 MG capsule Commonly known as: NEXIUM Take 40 mg by mouth daily in the afternoon.   furosemide 20 MG tablet Commonly known as: LASIX Take 1 tablet (20 mg total) by mouth daily as needed.   gabapentin 100 MG capsule Commonly known as: NEURONTIN Take 100 mg by mouth 3 (three) times daily as needed.   isosorbide mononitrate 30 MG 24 hr tablet Commonly known as: IMDUR Take 1 tablet (30 mg total) by mouth in the morning and at bedtime.   lidocaine 5 % Commonly known as: LIDODERM Place 1 patch onto the skin daily. Remove & Discard patch within 12 hours or as directed by MD   losartan-hydrochlorothiazide 50-12.5 MG tablet Commonly known as: HYZAAR Take 1/2 tablet once a day.   multivitamin with minerals tablet Take 1 tablet by mouth daily.   nitrofurantoin (macrocrystal-monohydrate) 100 MG capsule Commonly known as: MACROBID Take 1 capsule (100 mg total) by mouth 2 (two) times daily.   nitroGLYCERIN 0.4 MG SL tablet Commonly known as: NITROSTAT Place 1 tablet (0.4 mg total) under the tongue every 5 (five) minutes x 3 doses as needed for chest pain.   ondansetron 4 MG disintegrating tablet Commonly known as: ZOFRAN-ODT Take 1 tablet (4 mg total) by mouth every 8 (eight) hours as needed for nausea or vomiting.   potassium chloride 10 MEQ  tablet Commonly known as: KLOR-CON Take 1 tablet (10 mEq total) by mouth daily as needed (when taking furosemide).   traMADol 50 MG tablet Commonly known as: ULTRAM Take 50 mg by mouth daily.        Allergies:  Allergies  Allergen Reactions   Morphine And Related Nausea Only   Prednisone Nausea And Vomiting   Amlodipine Palpitations   Latex Rash    Family History: Family History  Problem Relation Age of Onset   Colon cancer Mother    Cerebral aneurysm Father    Heart attack Father    Heart attack Brother 35  Healthy Son     Social History:  reports that she has never smoked. She has never been exposed to tobacco smoke. She has never used smokeless tobacco. She reports that she does not drink alcohol and does not use drugs.   Physical Exam: There were no vitals taken for this visit.  Constitutional:  Well nourished. Alert and oriented, No acute distress. HEENT: [place] AT, moist mucus membranes.  Trachea midline, no masses. Cardiovascular: No clubbing, cyanosis, or edema. Respiratory: Normal respiratory effort, no increased work of breathing. GI: Abdomen is soft, non tender, non distended, no abdominal masses. Liver and spleen not palpable.  No hernias appreciated.  Stool sample for occult testing is not indicated.   GU: No CVA tenderness.  No bladder fullness or masses.  *** external genitalia, *** pubic hair distribution, no lesions.  Normal urethral meatus, no lesions, no prolapse, no discharge.   No urethral masses, tenderness and/or tenderness. No bladder fullness, tenderness or masses. *** vagina mucosa, *** estrogen effect, no discharge, no lesions, *** pelvic support, *** cystocele and *** rectocele noted.  No cervical motion tenderness.  Uterus is freely mobile and non-fixed.  No adnexal/parametria masses or tenderness noted.  Anus and perineum are without rashes or lesions.   ***  Skin: No rashes, bruises or suspicious lesions. Lymph: No cervical or inguinal  adenopathy. Neurologic: Grossly intact, no focal deficits, moving all 4 extremities. Psychiatric: Normal mood and affect.    Laboratory Data:    Latest Ref Rng & Units 08/09/2022   10:34 AM 08/27/2021    5:11 AM 05/06/2021    7:33 AM  CBC  WBC 3.8 - 10.8 Thousand/uL 9.3  5.8  7.5   Hemoglobin 11.7 - 15.5 g/dL 14.6  15.2  15.1   Hematocrit 35.0 - 45.0 % 43.7  43.6  45.3   Platelets 140 - 400 Thousand/uL 306  242  250       Latest Ref Rng & Units 08/09/2022   10:34 AM 08/27/2021    5:11 AM 05/06/2021    7:33 AM  CMP  Glucose 65 - 99 mg/dL 112  125  123   BUN 7 - 25 mg/dL '19  17  16   '$ Creatinine 0.60 - 1.00 mg/dL 0.96  0.99  0.96   Sodium 135 - 146 mmol/L 141  139  141   Potassium 3.5 - 5.3 mmol/L 4.5  3.9  4.4   Chloride 98 - 110 mmol/L 103  105  109   CO2 20 - 32 mmol/L '25  27  26   '$ Calcium 8.6 - 10.4 mg/dL 9.7  9.6  9.9   Total Protein 6.1 - 8.1 g/dL 6.4     Total Bilirubin 0.2 - 1.2 mg/dL 0.8     AST 10 - 35 U/L 21     ALT 6 - 29 U/L 27       Component     Latest Ref Rng 08/09/2022  Hemoglobin A1C     <5.7 % of total Hgb 5.9 (H)   Mean Plasma Glucose     mg/dL 123   eAG (mmol/L)     mmol/L 6.8     Legend: (H) High  Component     Latest Ref Rng 08/09/2022  Uric Acid, Serum     2.5 - 7.0 mg/dL 8.2 (H)     Legend: (H) High I have reviewed the labs.   Pertinent Imaging: I have independently reviewed the films.  See HPI.    Assessment &  Plan:    1.  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 Renal cyst  - Appreciated on RUS   4. Nocturia - explained her risk factors for nocturia: hypertension, diabetes, arthritis, anxiety and heart disease  - I explained to the patient that nocturia is often multi-factorial and difficult to treat.  Sleeping disorders, heart conditions, peripheral vascular disease, diabetes, an enlarged prostate for men, an  urethral stricture causing bladder outlet obstruction and/or certain medications can contribute to nocturia.  - I have suggested that the patient avoid caffeine after noon and alcohol in the evening.  He or she may also benefit from fluid restrictions after 6:00 in the evening and voiding just prior to bedtime.  - I have explained that research studies have showed that over 84% of patients with sleep apnea reported frequent nighttime urination.   With sleep apnea, oxygen decreases, carbon dioxide increases, the blood become more acidic, the heart rate drops and blood vessels in the lung constrict.  The body is then alerted that something is very wrong. The sleeper must wake enough to reopen the airway. By this time, the heart is racing and experiences a false signal of fluid overload. The heart excretes a hormone-like protein that tells the body to get rid of sodium and water, resulting in nocturia.  -  I also informed the patient that a recent study noted that decreasing sodium intake to 2.3 grams daily, if they don't have issues with hyponatremia, can also reduce the number of nightly voids  - There is also an increased incidence in sleep apnea with menopause, symptoms include night sweats, daytime sleepiness, depressed mood, and cognitive complaints like poor concentration or problems with short-term memory ***  - The patient may benefit from a discussion with his or her primary care physician to see if he or she has risk factors for sleep apnea or other sleep disturbances and obtaining a sleep study.   No follow-ups on file.  Anya Murphey, [place] [address], [place], [place] [address] [phone]

## 2022-08-21 ENCOUNTER — Ambulatory Visit: Payer: Medicare Other | Admitting: Urology

## 2022-08-21 ENCOUNTER — Telehealth: Payer: Self-pay | Admitting: Urology

## 2022-08-21 DIAGNOSIS — Z905 Acquired absence of kidney: Secondary | ICD-10-CM

## 2022-08-21 DIAGNOSIS — R109 Unspecified abdominal pain: Secondary | ICD-10-CM

## 2022-08-21 NOTE — Telephone Encounter (Signed)
she cant come in now  getting a ramp built at home per Shannon's instructions, I called pt and l/m to go to E/R as soon as she can

## 2022-08-22 ENCOUNTER — Encounter: Payer: Self-pay | Admitting: Podiatry

## 2022-08-22 ENCOUNTER — Ambulatory Visit: Payer: Medicare Other

## 2022-08-22 ENCOUNTER — Ambulatory Visit (INDEPENDENT_AMBULATORY_CARE_PROVIDER_SITE_OTHER): Payer: Medicare Other | Admitting: Podiatry

## 2022-08-22 DIAGNOSIS — M778 Other enthesopathies, not elsewhere classified: Secondary | ICD-10-CM

## 2022-08-22 DIAGNOSIS — M10372 Gout due to renal impairment, left ankle and foot: Secondary | ICD-10-CM | POA: Diagnosis not present

## 2022-08-22 DIAGNOSIS — I2510 Atherosclerotic heart disease of native coronary artery without angina pectoris: Secondary | ICD-10-CM

## 2022-08-22 DIAGNOSIS — M659 Synovitis and tenosynovitis, unspecified: Secondary | ICD-10-CM

## 2022-08-22 DIAGNOSIS — M7989 Other specified soft tissue disorders: Secondary | ICD-10-CM

## 2022-08-22 DIAGNOSIS — M65969 Unspecified synovitis and tenosynovitis, unspecified lower leg: Secondary | ICD-10-CM

## 2022-08-22 MED ORDER — COLCHICINE 0.6 MG PO TABS: 0.6000 mg | ORAL_TABLET | Freq: Every day | ORAL | 3 refills | Status: DC

## 2022-08-22 MED ORDER — METHYLPREDNISOLONE 4 MG PO TBPK: ORAL_TABLET | ORAL | 0 refills | Status: DC

## 2022-08-22 MED ORDER — TRIAMCINOLONE ACETONIDE 40 MG/ML IJ SUSP
20.0000 mg | Freq: Once | INTRAMUSCULAR | Status: AC
  Administered 2022-08-22: 20 mg

## 2022-08-22 MED ORDER — ALLOPURINOL 100 MG PO TABS: 100.0000 mg | ORAL_TABLET | Freq: Every day | ORAL | 3 refills | Status: DC

## 2022-08-22 NOTE — Progress Notes (Signed)
She presents today with a gout flareup.  States that she has been off of her allopurinol for quite some time and she is out of colchicine.  States that she had blood work done not long ago and with an elevated uric acid she was recommended to come see [place].  Objective: Vital signs are stable she is alert and oriented x3 pulses are palpable right foot demonstrates some pitting edema onto the dorsum of the foot and the first metatarsophalangeal joint is red and warm as is the medial aspect of the ankle and foot at the tibialis anterior tendon and its insertion site.  She has tenderness on palpation of these areas and range of motion.  Pitting edema extends up into the right leg.  She has no calf pain.  Radiographs taken today do not demonstrate significant osseous abnormalities to that right foot and leg but soft tissue swelling is noted.  Assessment: Pitting edema to the right lower extremity negative radiographs leading me to believe that this is most likely a uric acid attack or gout attack.  Plan: Discussed etiology pathology and surgical therapies at this point I injected her around the tendon with 10 mg Kenalog injected around the first metatarsal phalangeal joint with 10 mg of Kenalog and local anesthetic.  Started her back on her colchicine and allopurinol 200 mg a day.  We are requesting a complete metabolic panel to evaluate her kidneys liver and also asking for uric acid as well as a D-dimer.  Should her D-dimer be elevated we will send her for vascular studies.

## 2022-08-23 ENCOUNTER — Ambulatory Visit
Admission: RE | Admit: 2022-08-23 | Discharge: 2022-08-23 | Disposition: A | Discharge status: Home / Self Care | Payer: Medicare Other | Attending: Urology | Admitting: Urology

## 2022-08-23 ENCOUNTER — Ambulatory Visit
Admission: RE | Admit: 2022-08-23 | Discharge: 2022-08-23 | Disposition: A | Discharge status: Home / Self Care | Payer: Medicare Other | Source: Ambulatory Visit | Attending: Urology | Admitting: Urology

## 2022-08-23 DIAGNOSIS — N200 Calculus of kidney: Secondary | ICD-10-CM | POA: Diagnosis present

## 2022-08-24 ENCOUNTER — Telehealth: Payer: Self-pay | Admitting: Urology

## 2022-08-24 ENCOUNTER — Telehealth: Payer: Self-pay

## 2022-08-24 DIAGNOSIS — M7989 Other specified soft tissue disorders: Secondary | ICD-10-CM

## 2022-08-24 DIAGNOSIS — R7989 Other specified abnormal findings of blood chemistry: Secondary | ICD-10-CM

## 2022-08-24 NOTE — Telephone Encounter (Signed)
Pt left v/m wanted to know what the results were from the scan that was done 9/28.  Please call and leave message [phone]

## 2022-08-24 NOTE — Telephone Encounter (Signed)
Advised pt KUB has not been read yet. Pt states someone from our office called her this morning, she believed it was in regard to KUB done yesterday. Advised pt it is most likely appt reminder as she has an appt w [place] this week at which time Larene Beach will go over her KUB results. Pt expressed understanding.

## 2022-08-24 NOTE — Telephone Encounter (Signed)
Patient called back and declined to be sooner in [facility] and said she was not having any problems. She also stated that she did not need pr want a CT scan.  Sharyn Lull

## 2022-08-24 NOTE — Telephone Encounter (Signed)
Message was routed to appropriate staff to address 

## 2022-08-25 LAB — COMPREHENSIVE METABOLIC PANEL
ALT: 23 IU/L (ref 0–32)
AST: 21 IU/L (ref 0–40)
Albumin/Globulin Ratio: 1.8 (ref 1.2–2.2)
Albumin: 4.2 g/dL (ref 3.8–4.8)
Alkaline Phosphatase: 151 IU/L — ABNORMAL HIGH (ref 44–121)
BUN/Creatinine Ratio: 24 (ref 12–28)
BUN: 20 mg/dL (ref 8–27)
Bilirubin Total: 0.3 mg/dL (ref 0.0–1.2)
CO2: 25 mmol/L (ref 20–29)
Calcium: 9.7 mg/dL (ref 8.7–10.3)
Chloride: 106 mmol/L (ref 96–106)
Creatinine, Ser: 0.85 mg/dL (ref 0.57–1.00)
Globulin, Total: 2.3 g/dL (ref 1.5–4.5)
Glucose: 117 mg/dL — ABNORMAL HIGH (ref 70–99)
Potassium: 4.9 mmol/L (ref 3.5–5.2)
Sodium: 146 mmol/L — ABNORMAL HIGH (ref 134–144)
Total Protein: 6.5 g/dL (ref 6.0–8.5)
eGFR: 71 mL/min/{1.73_m2} (ref >59)

## 2022-08-25 LAB — URIC ACID: Uric Acid: 5.6 mg/dL (ref 3.1–7.9)

## 2022-08-25 LAB — D-DIMER, QUANTITATIVE: D-DIMER: 5.5 mg/L FEU — ABNORMAL HIGH (ref 0.00–0.49)

## 2022-08-28 NOTE — Progress Notes (Signed)
Faxed notes, referral, result note to [facility]. Confirmation received, contacted their office as well for stat scheduling.

## 2022-08-28 NOTE — Addendum Note (Signed)
Addended by: Rip Harbour on: 08/28/2022 08:45 AM   Modules accepted: Orders

## 2022-08-28 NOTE — Progress Notes (Unsigned)
08/29/22 3:40 PM   [name] 22-Nov-1946 [id number]  Referring provider:  [name], NP [address] [place],  [place] [address]  Urological history  1. Renal cell carcinoma - s/p left nephrectomy ~27 years ago for RCC - RUS 12/2021 - NED   2. Nephrolithiasis - no documented stones > 10 years - RUS on 01/22/2022 showed no stone burden    HPI: Carrie Larsen is a 76 y.o.female who presents today for a 1 year follow-up for night time frequency and urgency.  She is also experiencing right-sided flank pain.  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  She did not pass any fragments.  Patient denies any modifying or aggravating factors.  Patient denies any gross hematuria, dysuria or suprapubic/flank pain.  Patient denies any fevers, chills, nausea or vomiting.    She does continue to have nocturia x4-5 nightly with some nighttime incontinence as well.  She states she has no issues of frequent trips to the restroom to void and does not have incontinent episodes during the day.  UA 11-30 WBC's and moderate bacteria  KUB (08/23/2022) no nephrolithiasis  Serum creatinine (07/2022) 0.85  PVR 10 mL   Appointment was interrupted for an Insurance claims handler.  PMH: Past Medical History:  Diagnosis Date   Anxiety    Arthritis    osteoarthritis   CAD (coronary artery disease)    a. 2012 ETT: no ischemia;  b. 06/2017 NSTEMI/PCI: LM nl, LAD nl, D1 70-80p, LCX nl, OM1 90 (3.0 x 23 Xience [place]), RCA dominant, 100p/m, fills via L->R collats, EF 55-65%.   Cancer [facility])    a. s/p partial left nephrectomy.   Chicken pox    CME (cystoid macular edema), left 11/20/2018   Colon cancer ([place]) 1992   T3, N1, M0. colon    Colon polyp 2011   COVID-19 55/7322   Diastolic dysfunction    a. 08/2015 Echo: EF 60-65%, no rwma, Gr1 DD, midlly  dil LA, PASP 58mHg. No significant valvular dzs; b. 06/2017 Echo: EF 60-65%, Gr1DD, mildly dil LA.   Essential hypertension    GERD (gastroesophageal reflux disease)    Gout    History of appendectomy 06/07/2015   History of kidney cancer 11/20/2018   History of kidney stones    passed - 2   Hyperlipidemia    Lumbar spinal stenosis    Myocardial infarction ([place]    06/2017   Nonexudative age-related macular degeneration, bilateral, early dry stage 11/20/2018   Obesity    Prediabetes    Pseudophakia of both eyes    Retinal cyst     Surgical History: Past Surgical History:  Procedure Laterality Date   ABDOMINAL HYSTERECTOMY     APPENDECTOMY  06/07/2015   CARDIAC CATHETERIZATION     CATARACT EXTRACTION W/PHACO Left 04/24/2016   Procedure: CATARACT EXTRACTION PHACO AND INTRAOCULAR LENS PLACEMENT ([place];  Surgeon: WBirder Robson MD;  Location: [facility] ORS;  Service: Ophthalmology;  Laterality: Left;  [place]45.[place] AP% 16.4 CDE 7.53 FLUID PACK LOT # [id number]   CATARACT EXTRACTION W/PHACO Right 06/05/2016   Procedure: CATARACT EXTRACTION PHACO AND INTRAOCULAR LENS PLACEMENT (IOC);  Surgeon: WBirder Robson MD;  Location: [facility] ORS;  Service: Ophthalmology;  Laterality: Right;  [place]25[place] AP% 21.9 CDE 5.68 Fluid pack lot # [id number]   COLON RESECTION  03/04/1991  COLON SURGERY     COLONOSCOPY  03-14-10   Dr Bary Castilla, tubular adenoma at 25 cm.   COLONOSCOPY WITH PROPOFOL N/A 06/07/2015   Procedure: COLONOSCOPY WITH PROPOFOL;  Surgeon: Robert Bellow, MD;  Location[facility] ENDOSCOPY;  Service: Endoscopy;  Laterality: N/A;   COLONOSCOPY WITH PROPOFOL N/A 08/10/2020   Procedure: COLONOSCOPY WITH PROPOFOL;  Surgeon: Robert Bellow, MD;  Location: [facility] ENDOSCOPY;  Service: Endoscopy;  Laterality: N/A;   CORONARY STENT INTERVENTION N/A 06/28/2017   Procedure: CORONARY STENT INTERVENTION;  Surgeon: Yolonda Kida, MD;  Location: [place] CV LAB;  Service: Cardiovascular;  Laterality: N/A;    EYE SURGERY     lens eye surgery   HERNIA REPAIR  3335   umbilical   LEFT HEART CATH AND CORONARY ANGIOGRAPHY N/A 06/28/2017   Procedure: LEFT HEART CATH AND CORONARY ANGIOGRAPHY;  Surgeon: Minna Merritts, MD;  Location: [place] CV LAB;  Service: Cardiovascular;  Laterality: N/A;   LUMBAR LAMINECTOMY/DECOMPRESSION MICRODISCECTOMY Bilateral 03/10/2018   Procedure: Laminectomy and Foraminotomy - Lumbar three-Lumbar four - Lumbar four-Lumbar five - bilateral;  Surgeon: Earnie Larsson, MD;  Location: [place];  Service: Neurosurgery;  Laterality: Bilateral;   NEPHRECTOMY  1992   left- cancer   RIB RESECTION     removal 4 ribs   TONSILLECTOMY      Home Medications:  Allergies as of 08/29/2022       Reactions   Morphine And Related Nausea Only   Prednisone Nausea And Vomiting   Amlodipine Palpitations   Latex Rash        Medication List        Accurate as of August 29, 2022  3:40 PM. If you have any questions, ask your nurse or doctor.          STOP taking these medications    nitrofurantoin (macrocrystal-monohydrate) 100 MG capsule Commonly known as: MACROBID Stopped by: Harvest Stanco, PA-C       TAKE these medications    allopurinol 100 MG tablet Commonly known as: ZYLOPRIM Take 1 tablet (100 mg total) by mouth daily.   aspirin EC 81 MG tablet Take 81 mg by mouth daily. Swallow whole.   atorvastatin 80 MG tablet Commonly known as: LIPITOR Take 1 tablet by mouth once daily   carvedilol 25 MG tablet Commonly known as: COREG Take 1 tablet (25 mg total) by mouth 2 (two) times daily.   colchicine 0.6 MG tablet Take 1 tablet (0.6 mg total) by mouth daily. Take 1 tablet TID until GI upset or flare subsides, then for maintenance, take 1 tablet daily.   esomeprazole 40 MG capsule Commonly known as: NEXIUM Take 40 mg by mouth daily in the afternoon.   furosemide 20 MG tablet Commonly known as: LASIX Take 1 tablet (20 mg total) by mouth daily as needed.    gabapentin 100 MG capsule Commonly known as: NEURONTIN Take 100 mg by mouth 3 (three) times daily as needed.   Gemtesa 75 MG Tabs Generic drug: Vibegron Take 75 mg by mouth daily. Started by: Zara Council, PA-C   isosorbide mononitrate 30 MG 24 hr tablet Commonly known as: IMDUR Take 1 tablet (30 mg total) by mouth in the morning and at bedtime.   lidocaine 5 % Commonly known as: LIDODERM Place 1 patch onto the skin daily. Remove & Discard patch within 12 hours or as directed by MD   losartan-hydrochlorothiazide 50-12.5 MG tablet Commonly known as: HYZAAR Take 1/2 tablet once a day.  methylPREDNISolone 4 MG Tbpk tablet Commonly known as: MEDROL DOSEPAK 6 day dose pack - take as directed   multivitamin with minerals tablet Take 1 tablet by mouth daily.   nitroGLYCERIN 0.4 MG SL tablet Commonly known as: NITROSTAT Place 1 tablet (0.4 mg total) under the tongue every 5 (five) minutes x 3 doses as needed for chest pain.   ondansetron 4 MG disintegrating tablet Commonly known as: ZOFRAN-ODT Take 1 tablet (4 mg total) by mouth every 8 (eight) hours as needed for nausea or vomiting.   potassium chloride 10 MEQ tablet Commonly known as: KLOR-CON Take 1 tablet (10 mEq total) by mouth daily as needed (when taking furosemide).   traMADol 50 MG tablet Commonly known as: ULTRAM Take 50 mg by mouth daily.        Allergies:  Allergies  Allergen Reactions   Morphine And Related Nausea Only   Prednisone Nausea And Vomiting   Amlodipine Palpitations   Latex Rash    Family History: Family History  Problem Relation Age of Onset   Colon cancer Mother    Cerebral aneurysm Father    Heart attack Father    Heart attack Brother 59   Healthy Son     Social History:  reports that she has never smoked. She has never been exposed to tobacco smoke. She has never used smokeless tobacco. She reports that she does not drink alcohol and does not use drugs.   Physical  Exam: BP 136/74   Pulse 76   Ht '5\' 10"'$  (1.778 m)   Wt 268 lb (121.6 kg)   BMI 38.45 kg/m   Constitutional:  Well nourished. Alert and oriented, No acute distress. HEENT: [place] AT, moist mucus membranes.  Trachea midline Cardiovascular: No clubbing, cyanosis, or edema. Respiratory: Normal respiratory effort, no increased work of breathing. Neurologic: Grossly intact, no focal deficits, moving all 4 extremities. Psychiatric: Normal mood and affect.    Laboratory Data:    Latest Ref Rng & Units 08/09/2022   10:34 AM 08/27/2021    5:11 AM 05/06/2021    7:33 AM  CBC  WBC 3.8 - 10.8 Thousand/uL 9.3  5.8  7.5   Hemoglobin 11.7 - 15.5 g/dL 14.6  15.2  15.1   Hematocrit 35.0 - 45.0 % 43.7  43.6  45.3   Platelets 140 - 400 Thousand/uL 306  242  250       Latest Ref Rng & Units 08/23/2022    1:11 PM 08/09/2022   10:34 AM 08/27/2021    5:11 AM  CMP  Glucose 70 - 99 mg/dL 117  112  125   BUN 8 - 27 mg/dL '20  19  17   '$ Creatinine 0.57 - 1.00 mg/dL 0.85  0.96  0.99   Sodium 134 - 144 mmol/L 146  141  139   Potassium 3.5 - 5.2 mmol/L 4.9  4.5  3.9   Chloride 96 - 106 mmol/L 106  103  105   CO2 20 - 29 mmol/L '25  25  27   '$ Calcium 8.7 - 10.3 mg/dL 9.7  9.7  9.6   Total Protein 6.0 - 8.5 g/dL 6.5  6.4    Total Bilirubin 0.0 - 1.2 mg/dL 0.3  0.8    Alkaline Phos 44 - 121 IU/L 151     AST 0 - 40 IU/L 21  21    ALT 0 - 32 IU/L 23  27      Component     Latest Ref  Rng 08/09/2022  Hemoglobin A1C     <5.7 % of total Hgb 5.9 (H)   Mean Plasma Glucose     mg/dL 123   eAG (mmol/L)     mmol/L 6.8     Legend: (H) High  Component     Latest Ref Rng 08/09/2022  Uric Acid, Serum     2.5 - 7.0 mg/dL 8.2 (H)     Legend: (H) High  Urinalysis  See Epic and HPI I have reviewed the labs.   Pertinent Imaging: CLINICAL DATA:  Encounter for kidney stones. Nephrolithiasis. History of left nephrectomy.   EXAM: ABDOMEN - 1 VIEW   COMPARISON:  03/15/2019   FINDINGS: Again noted are surgical  clips in left abdomen compatible with previous nephrectomy. Moderate amount of stool in the abdomen and pelvis. Stable surgical clips in the left hemipelvis. Prominent degenerative changes in the lower lumbar spine. No evidence for urinary calculi. Nonobstructive bowel gas pattern.   IMPRESSION: 1. No clear evidence for nephrolithiasis. 2. Postsurgical changes compatible with history of left nephrectomy. 3. Nonobstructive bowel gas pattern with moderate stool burden.     Electronically Signed   By: Markus Daft M.D.   On: 08/25/2022 10:26    08/29/22 15:10  Scan Result 60m   I have independently reviewed the films.  See HPI.    Assessment & Plan:    1.  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 Renal cyst  - Appreciated on RUS   4. Nocturia -We had addressed this issue at a visit in April and since the fire alarm started during her visit, she was going to retry the [place]samples and return in 3 weeks and if she found the Gemtesa samples helpful we were going to send in a prescription for trospium 20 mg nightly -Her urinalysis was fairly similar today versus the one that she had in the ER few weeks ago and the culture result from that was negative, so she is not having urinary tract infection symptoms today and therefore I will not send this urine for culture at this time  Return in about 3 weeks (around 09/19/2022) for OAB and PVR .  Kamesha Herne, PAlafaya[address] [place] [phone]

## 2022-08-29 ENCOUNTER — Telehealth: Payer: Self-pay | Admitting: Podiatry

## 2022-08-29 ENCOUNTER — Encounter: Payer: Self-pay | Admitting: Urology

## 2022-08-29 ENCOUNTER — Telehealth (INDEPENDENT_AMBULATORY_CARE_PROVIDER_SITE_OTHER): Payer: Self-pay

## 2022-08-29 ENCOUNTER — Ambulatory Visit (INDEPENDENT_AMBULATORY_CARE_PROVIDER_SITE_OTHER): Payer: Medicare Other | Admitting: Urology

## 2022-08-29 ENCOUNTER — Other Ambulatory Visit: Payer: Self-pay | Admitting: *Deleted

## 2022-08-29 VITALS — BP 136/74 | HR 76 | Ht 70.0 in | Wt 268.0 lb

## 2022-08-29 DIAGNOSIS — R7989 Other specified abnormal findings of blood chemistry: Secondary | ICD-10-CM

## 2022-08-29 DIAGNOSIS — I2510 Atherosclerotic heart disease of native coronary artery without angina pectoris: Secondary | ICD-10-CM

## 2022-08-29 DIAGNOSIS — R109 Unspecified abdominal pain: Secondary | ICD-10-CM | POA: Diagnosis not present

## 2022-08-29 DIAGNOSIS — N281 Cyst of kidney, acquired: Secondary | ICD-10-CM

## 2022-08-29 DIAGNOSIS — R351 Nocturia: Secondary | ICD-10-CM | POA: Diagnosis not present

## 2022-08-29 DIAGNOSIS — Z905 Acquired absence of kidney: Secondary | ICD-10-CM | POA: Diagnosis not present

## 2022-08-29 LAB — BLADDER SCAN AMB NON-IMAGING

## 2022-08-29 MED ORDER — GEMTESA 75 MG PO TABS: 75.0000 mg | ORAL_TABLET | Freq: Every day | ORAL | 0 refills | Status: DC

## 2022-08-29 NOTE — Telephone Encounter (Signed)
Spoke with patient  - I think she stated that she was at another Dr. Hilaria Ota.. I could only hear bits and pieces of the call. I tried to ask patient if she could come to the Friday, 10.4.23 appt at 8:00 am. I believe she said that she did not know what I was talking about( not sure ?) but she stated that she would call me back and hung the phone up. I will await her call - if patient calls back, please ask her if the appt that I scheduled for Friday 10.6.23 @ 8:00 is ok. That will be the first thing we have for a dvt study.

## 2022-08-29 NOTE — Telephone Encounter (Signed)
LVM for pt TCB and let me know if 8:00 am on Friday 10.6.69 works for the urgent DVT study. Nothing further needed at this time.

## 2022-08-29 NOTE — Telephone Encounter (Signed)
Pt called stating she had a call from our number today while she was at a procedure and she said they were talking fast and could not understand what they were saying.  I did look in chart and see nothing from our office but did see a message from [facility] and I gave pt the number to call them because they did schedule pt for an appt there 10.6.2023.

## 2022-08-30 LAB — URINALYSIS, COMPLETE
Bilirubin, UA: NEGATIVE
Glucose, UA: NEGATIVE
Ketones, UA: NEGATIVE
Nitrite, UA: NEGATIVE
Protein,UA: NEGATIVE
RBC, UA: NEGATIVE
Specific Gravity, UA: 1.03 (ref 1.005–1.030)
Urobilinogen, Ur: 0.2 mg/dL (ref 0.2–1.0)
pH, UA: 5 (ref 5.0–7.5)

## 2022-08-30 LAB — MICROSCOPIC EXAMINATION

## 2022-08-31 ENCOUNTER — Other Ambulatory Visit: Payer: Self-pay | Admitting: Podiatry

## 2022-08-31 ENCOUNTER — Encounter (INDEPENDENT_AMBULATORY_CARE_PROVIDER_SITE_OTHER): Payer: Medicare Other

## 2022-08-31 DIAGNOSIS — R7989 Other specified abnormal findings of blood chemistry: Secondary | ICD-10-CM

## 2022-08-31 DIAGNOSIS — M7989 Other specified soft tissue disorders: Secondary | ICD-10-CM

## 2022-08-31 NOTE — Telephone Encounter (Signed)
[place] 09/03/22

## 2022-09-03 ENCOUNTER — Encounter (INDEPENDENT_AMBULATORY_CARE_PROVIDER_SITE_OTHER): Payer: Medicare Other

## 2022-09-04 ENCOUNTER — Other Ambulatory Visit: Payer: Medicare Other

## 2022-09-06 ENCOUNTER — Ambulatory Visit: Payer: Medicare Other | Admitting: Nurse Practitioner

## 2022-09-10 ENCOUNTER — Encounter (INDEPENDENT_AMBULATORY_CARE_PROVIDER_SITE_OTHER): Payer: Medicare Other

## 2022-09-13 ENCOUNTER — Emergency Department
Admission: EM | Admit: 2022-09-13 | Discharge: 2022-09-13 | Disposition: A | Discharge status: Home / Self Care | Payer: Medicare Other | Attending: Emergency Medicine | Admitting: Emergency Medicine

## 2022-09-13 ENCOUNTER — Encounter: Payer: Self-pay | Admitting: Emergency Medicine

## 2022-09-13 ENCOUNTER — Telehealth: Payer: Self-pay | Admitting: Podiatry

## 2022-09-13 ENCOUNTER — Other Ambulatory Visit: Payer: Self-pay

## 2022-09-13 ENCOUNTER — Ambulatory Visit (INDEPENDENT_AMBULATORY_CARE_PROVIDER_SITE_OTHER): Payer: Medicare Other

## 2022-09-13 DIAGNOSIS — I10 Essential (primary) hypertension: Secondary | ICD-10-CM | POA: Insufficient documentation

## 2022-09-13 DIAGNOSIS — I2510 Atherosclerotic heart disease of native coronary artery without angina pectoris: Secondary | ICD-10-CM | POA: Diagnosis not present

## 2022-09-13 DIAGNOSIS — Z85038 Personal history of other malignant neoplasm of large intestine: Secondary | ICD-10-CM | POA: Diagnosis not present

## 2022-09-13 DIAGNOSIS — M7989 Other specified soft tissue disorders: Secondary | ICD-10-CM

## 2022-09-13 DIAGNOSIS — I82411 Acute embolism and thrombosis of right femoral vein: Secondary | ICD-10-CM | POA: Insufficient documentation

## 2022-09-13 DIAGNOSIS — Z87442 Personal history of urinary calculi: Secondary | ICD-10-CM | POA: Diagnosis not present

## 2022-09-13 DIAGNOSIS — R7989 Other specified abnormal findings of blood chemistry: Secondary | ICD-10-CM | POA: Diagnosis not present

## 2022-09-13 LAB — BASIC METABOLIC PANEL
Anion gap: 9 (ref 5–15)
BUN: 20 mg/dL (ref 8–23)
CO2: 25 mmol/L (ref 22–32)
Calcium: 9.4 mg/dL (ref 8.9–10.3)
Chloride: 105 mmol/L (ref 98–111)
Creatinine, Ser: 0.88 mg/dL (ref 0.44–1.00)
GFR, Estimated: >60 mL/min (ref >60)
Glucose, Bld: 98 mg/dL (ref 70–99)
Potassium: 3.9 mmol/L (ref 3.5–5.1)
Sodium: 139 mmol/L (ref 135–145)

## 2022-09-13 LAB — CBC
HCT: 43.9 % (ref 36.0–46.0)
Hemoglobin: 14 g/dL (ref 12.0–15.0)
MCH: 29.1 pg (ref 26.0–34.0)
MCHC: 31.9 g/dL (ref 30.0–36.0)
MCV: 91.3 fL (ref 80.0–100.0)
Platelets: 269 10*3/uL (ref 150–400)
RBC: 4.81 MIL/uL (ref 3.87–5.11)
RDW: 13.6 % (ref 11.5–15.5)
WBC: 7.7 10*3/uL (ref 4.0–10.5)
nRBC: 0 % (ref 0.0–0.2)

## 2022-09-13 MED ORDER — APIXABAN (ELIQUIS) VTE STARTER PACK (10MG AND 5MG): ORAL_TABLET | ORAL | 0 refills | Status: DC

## 2022-09-13 MED ORDER — APIXABAN 5 MG PO TABS
10.0000 mg | ORAL_TABLET | Freq: Once | ORAL | Status: AC
  Administered 2022-09-13: 10 mg via ORAL
  Filled 2022-09-13: qty 2

## 2022-09-13 NOTE — Discharge Instructions (Signed)
Take blood thinner Eliquis as prescribed. Follow-up with Dr. Lucky Cowboy vascular surgeon this week give their office a call tomorrow morning to schedule.  If you develop any chest pain or trouble breathing come back to the emergency department right away.  Thank you for choosing [place] for your health care today!  Please see your primary doctor this week for a follow up appointment.   If you do not have a primary doctor call the following clinics to establish care:  If you have insurance:  [facility] [phone] [place] [address]   [facility]  [phone] [address] [place] [address]   If you do not have insurance:  [facility]  [phone] [address], [place] [address]  Sometimes, in the early stages of certain disease courses it is difficult to detect in the emergency department evaluation -- so, it is important that you continue to monitor your symptoms and call your doctor right away or return to the emergency department if you develop any new or worsening symptoms.  It was my pleasure to care for you today.   Hoover Brunette Jacelyn Grip, MD

## 2022-09-13 NOTE — Telephone Encounter (Signed)
Spoke with Coralyn Mark w/ [facility]&V and informed him to have patient to go straight to ER, she  was there and said that she was going directly to [place] the hospital to make aware  and gave patient's name.

## 2022-09-13 NOTE — Telephone Encounter (Signed)
I have Carrie Larsen on the line. they need to speak to someone ASAP. Pt does have a blood clot in L leg & wants to know what needs to be done. She is a pt of Dr. Milinda Pointer

## 2022-09-13 NOTE — ED Provider Triage Note (Addendum)
Emergency Medicine Provider Triage Evaluation Note  Carrie Larsen , a 76 y.o. female  was evaluated in triage.  Pt complains of swelling in her right leg.  She had swelling for 1 month.  Was seen by podiatrist and referred to [facility] for ultrasound for possible DVT.  Ultrasound today showed DVT and patient was referred to the ER for management.  No chest pain, shortness of breath.  No recent surgeries.  No history of blood clots.  She takes aspirin 81 mg daily.   Venous ultrasound results from today Summary:  RIGHT:  - Findings consistent with acute deep vein thrombosis involving the right  femoral vein, right popliteal vein, right posterior tibial veins, and  right gastrocnemius veins.       Review of Systems  Positive: Right leg swelling Negative: Chest pain, shortness of breath  Physical Exam  BP (!) 151/105 (BP Location: Left Arm)   Pulse 69   Temp 98.3 F (36.8 C) (Oral)   Resp 18   SpO2 95%  Gen:   Awake, no distress   Resp:  Normal effort  MSK:   Moves extremities without difficulty  Other:  Moderate swelling edema right lower leg  Medical Decision Making  Medically screening exam initiated at 4:10 PM.  Appropriate orders placed.  [name] was informed that the remainder of the evaluation will be completed by another provider, this initial triage assessment does not replace that evaluation, and the importance of remaining in the ED until their evaluation is complete.  Called [facility] on-call provider.  Due to chronicity of patient's pain, swelling in her right leg for at least 2 months, recommendation for patient will be to start Eliquis and follow-up with vein and vascular in 1 week.   Duanne Guess, PA-C 09/13/22 1611    Duanne Guess, PA-C 09/13/22 1904    Duanne Guess, PA-C 09/13/22 1945

## 2022-09-13 NOTE — ED Triage Notes (Signed)
Pt presents after having DVT study on right leg that was ordered for pain and swelling. States they saw a clot while doing to the study and told her to come to the ED asap.

## 2022-09-13 NOTE — ED Notes (Signed)
Pt ambulatory to rm at this time. Pt using toilet in rm and not placed on cardiac monitor. Caitlin, RN aware of pt being in rm and using the toilet. Pt given a hospital gown to change into.

## 2022-09-13 NOTE — ED Provider Notes (Signed)
[facility] Provider Note    Event Date/Time   First MD Initiated Contact with Patient 09/13/22 1928     (approximate)   History   DVT   HPI  Carrie Larsen is a 76 y.o. female   Past medical history of remote colon cancer, CAD, hypertension, kidney stones, hyperlipidemia, presents today with a finding of DVT on outpatient ultrasound ordered by her vascular surgeon.  She had been experiencing right leg swelling and pain for approximately 1 month and was referred to [facility] for ultrasound which showed DVT involving the right femoral vein, right popliteal vein, right posterior tibial veins and right gastrocnemius veins.    Denies chest pain or shortness of breath.    Her hemodynamics here are appropriate, slightly hypertensive normal heart rate and afebrile without tachypnea or respiratory distress no hypoxemia.  History was obtained via the patient and review of external medical notes      Physical Exam   Triage Vital Signs: ED Triage Vitals  Enc Vitals Group     BP 09/13/22 1554 (!) 151/105     Pulse Rate 09/13/22 1554 69     Resp 09/13/22 1554 18     Temp 09/13/22 1554 98.3 F (36.8 C)     Temp Source 09/13/22 1554 Oral     SpO2 09/13/22 1554 95 %     Weight --      Height --      Head Circumference --      Peak Flow --      Pain Score 09/13/22 1552 4     Pain Loc --      Pain Edu? --      Excl. in [place]? --     Most recent vital signs: Vitals:   09/13/22 1554  BP: (!) 151/105  Pulse: 69  Resp: 18  Temp: 98.3 F (36.8 C)  SpO2: 95%    General: Awake, no distress.  CV:  Good peripheral perfusion.  Resp:  Normal effort.  Abd:  No distention.  Other:  Right ankle swelling, mild tenderness, neurovascular intact, no significant skin changes   ED Results / Procedures / Treatments   Labs (all labs ordered are listed, but only abnormal results are displayed) Labs Reviewed  CBC  BASIC METABOLIC PANEL     I  reviewed labs and they are notable for normal PLT and H&H PROCEDURES:  Critical Care performed: No  Procedures   MEDICATIONS ORDERED IN ED: Medications  apixaban (ELIQUIS) tablet 10 mg (has no administration in time range)    Consultants:  Together with physician assistant Dorise Hiss we spoke with vascular surgery Arna Medici (PA) and Dr Laurence Compton for consultation regarding management.  IMPRESSION / MDM / ASSESSMENT AND PLAN / ED COURSE  I reviewed the triage vital signs and the nursing notes.                              Differential diagnosis includes, but is not limited to, DVT, less likely PE.   MDM: This is a patient with DVT found on ultrasound today, symptoms onset about 1 month ago, sensation intact and neurovascular intact with some swelling to the ankle.  She has no chest pain or dyspnea and I doubt she has a PE with normal hemodynamics today.  Considered admission with IV heparin and thrombectomy, but, Dorise Hiss our emergency physician assistant discussed with vascular surgeon Dr. Laurence Compton  and their team and they deemed the patient more appropriate for outpatient treatment with Eliquis and close follow-up next week with vascular.   Patient's presentation is most consistent with acute presentation with potential threat to life or bodily function.       FINAL CLINICAL IMPRESSION(S) / ED DIAGNOSES   Final diagnoses:  Acute deep vein thrombosis (DVT) of femoral vein of right lower extremity (HCC)     Rx / DC Orders   ED Discharge Orders          Ordered    APIXABAN (ELIQUIS) VTE STARTER PACK ('10MG'$  AND '5MG'$ )        09/13/22 1943             Note:  This document was prepared using Dragon voice recognition software and may include unintentional dictation errors.    Lucillie Garfinkel, MD 09/13/22 1944

## 2022-09-13 NOTE — Telephone Encounter (Signed)
Spoke with ER to advise that patient is on her way and that she was diagnosed with DVT

## 2022-09-14 ENCOUNTER — Telehealth (INDEPENDENT_AMBULATORY_CARE_PROVIDER_SITE_OTHER): Payer: Self-pay

## 2022-09-14 ENCOUNTER — Telehealth: Payer: Self-pay | Admitting: Cardiovascular Disease

## 2022-09-14 NOTE — Telephone Encounter (Addendum)
Advised that vascular provider is treating blood clot and our office would assist with 30 day free trial offer Verbalized understanding. [facility] contacted but eliquis rx had been transferred to [facility] [phone]) [place], [place] 30 day free voucher called to [facility] with RxBIN: [place]: [place] [id number] ID: [id number] day voucher was successful and pharmacist says she would contact patient to let her know rx was ready  Advised message would be sent to vascular office for assistance with eliquis patient assistance and need for ED follow up appointment

## 2022-09-14 NOTE — Telephone Encounter (Signed)
Pt c/o medication issue:  1. Name of Medication:  Eliquis  2. How are you currently taking this medication (dosage and times per day)?   3. Are you having a reaction (difficulty breathing--STAT)?   4. What is your medication issue?   Patient is following up regarding Eliquis prescription. She states she went to her local pharmacy to pick it up, but they advised her it would cost $600. Patient states she is unable to afford it at this time. Patient was audibly upset on the phone due to being unable to afford the medication. Can patient assistance be offered in this case? Is patient eligible for a less expensive alternative? Please advise.

## 2022-09-14 NOTE — Telephone Encounter (Signed)
Patient left a message stating that she was diagnosis with dvt on yesterday. Patient was prescribe Eliquis and she stated that she is not able to afford the $600 cost. Patient was giving a few pills by pharmacy. Patient will coming by the office to pick some samples on Monday and will be schedule to come for follow up.

## 2022-09-14 NOTE — Telephone Encounter (Signed)
Patient stated she went to the ED yesterday and the found she had a clot.  Patient stated she would like to know if it is safe for her to take aspirin and she would like to discuss medication she was given.  Patient stated she will hold off taking her morning medications until she receives a call back.

## 2022-09-14 NOTE — Telephone Encounter (Signed)
Advised that she should take all her medications as prescribed including eliquis and aspirin 81 mg. Advised message would be sent to provider. Verbalized understanding.

## 2022-09-18 ENCOUNTER — Ambulatory Visit: Payer: Medicare Other | Admitting: Medical

## 2022-09-19 NOTE — Progress Notes (Incomplete)
09/19/22 9:00 AM   [place] Aug 10, 1946 [id number]  Referring provider:  [name], NP [address] [place],  [place] [address]  Urological history  1. Renal cell carcinoma - s/p left nephrectomy ~27 years ago for RCC - RUS 12/2021 - NED   2. Nephrolithiasis - no documented stones > 10 years - RUS on 01/22/2022 showed no stone burden    HPI: Niyana Chesbro is a 76 y.o.female who presents today for a 3 week follow up after a trial of Gemtesa 75 mg daily.  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  She continued to have nocturia x4-5 nightly with some nighttime incontinence as well.  She stated she has no issues of frequent trips to the restroom to void and does not have incontinent episodes during the day.    She was recently seen in the ED on September 13, 2022 after it was discovered she had a right DVT by an outpatient ultrasound ordered by her vascular surgeon.  PVR ***  PMH: Past Medical History:  Diagnosis Date   Anxiety    Arthritis    osteoarthritis   CAD (coronary artery disease)    a. 2012 ETT: no ischemia;  b. 06/2017 NSTEMI/PCI: LM nl, LAD nl, D1 70-80p, LCX nl, OM1 90 (3.0 x 23 Xience [place]), RCA dominant, 100p/m, fills via L->R collats, EF 55-65%.   Cancer [facility])    a. s/p partial left nephrectomy.   Chicken pox    CME (cystoid macular edema), left 11/20/2018   Colon cancer ([facility]) 1992   T3, N1, M0. colon    Colon polyp 2011   COVID-19 59/9357   Diastolic dysfunction    a. 08/2015 Echo: EF 60-65%, no rwma, Gr1 DD, midlly dil LA, PASP 60mHg. No significant valvular dzs; b. 06/2017 Echo: EF 60-65%, Gr1DD, mildly dil LA.   Essential hypertension    GERD (gastroesophageal reflux disease)    Gout    History of appendectomy 06/07/2015   History of kidney cancer 11/20/2018    History of kidney stones    passed - 2   Hyperlipidemia    Lumbar spinal stenosis    Myocardial infarction ([facility]    06/2017   Nonexudative age-related macular degeneration, bilateral, early dry stage 11/20/2018   Obesity    Prediabetes    Pseudophakia of both eyes    Retinal cyst     Surgical History: Past Surgical History:  Procedure Laterality Date   ABDOMINAL HYSTERECTOMY     APPENDECTOMY  06/07/2015   CARDIAC CATHETERIZATION     CATARACT EXTRACTION W/PHACO Left 04/24/2016   Procedure: CATARACT EXTRACTION PHACO AND INTRAOCULAR LENS PLACEMENT (ISan Patricio;  Surgeon: WBirder Robson MD;  Location: [facility] ORS;  Service: Ophthalmology;  Laterality: Left;  [place]45.[place] AP% 16.4 CDE 7.53 FLUID PACK LOT # [id number]   CATARACT EXTRACTION W/PHACO Right 06/05/2016   Procedure: CATARACT EXTRACTION PHACO AND INTRAOCULAR LENS PLACEMENT (IOC);  Surgeon: WBirder Robson MD;  Location: [facility] ORS;  Service: Ophthalmology;  Laterality: Right;  [place]25.9 AP% 21.9 CDE 5.68 Fluid pack lot # [id number]   COLON RESECTION  03/04/1991   COLON SURGERY     COLONOSCOPY  03-14-10   Dr BBary Castilla tubular adenoma at 25 cm.   COLONOSCOPY WITH PROPOFOL N/A 06/07/2015   Procedure: COLONOSCOPY WITH  PROPOFOL;  Surgeon: Robert Bellow, MD;  Location: [facility] ENDOSCOPY;  Service: Endoscopy;  Laterality: N/A;   COLONOSCOPY WITH PROPOFOL N/A 08/10/2020   Procedure: COLONOSCOPY WITH PROPOFOL;  Surgeon: Robert Bellow, MD;  Location: [facility] ENDOSCOPY;  Service: Endoscopy;  Laterality: N/A;   CORONARY STENT INTERVENTION N/A 06/28/2017   Procedure: CORONARY STENT INTERVENTION;  Surgeon: Yolonda Kida, MD;  Location: [facility] CV LAB;  Service: Cardiovascular;  Laterality: N/A;   EYE SURGERY     lens eye surgery   HERNIA REPAIR  1610   umbilical   LEFT HEART CATH AND CORONARY ANGIOGRAPHY N/A 06/28/2017   Procedure: LEFT HEART CATH AND CORONARY ANGIOGRAPHY;  Surgeon: Minna Merritts, MD;  Location: [place] [facility] LAB;   Service: Cardiovascular;  Laterality: N/A;   LUMBAR LAMINECTOMY/DECOMPRESSION MICRODISCECTOMY Bilateral 03/10/2018   Procedure: Laminectomy and Foraminotomy - Lumbar three-Lumbar four - Lumbar four-Lumbar five - bilateral;  Surgeon: Earnie Larsson, MD;  Location: [facility];  Service: Neurosurgery;  Laterality: Bilateral;   NEPHRECTOMY  1992   left- cancer   RIB RESECTION     removal 4 ribs   TONSILLECTOMY      Home Medications:  Allergies as of 09/20/2022       Reactions   Morphine And Related Nausea Only   Prednisone Nausea And Vomiting   Amlodipine Palpitations   Latex Rash        Medication List        Accurate as of September 19, 2022  9:00 AM. If you have any questions, ask your nurse or doctor.          allopurinol 100 MG tablet Commonly known as: ZYLOPRIM Take 1 tablet (100 mg total) by mouth daily.   Apixaban Starter Pack ('10mg'$  and '5mg'$ ) Commonly known as: ELIQUIS STARTER PACK Take as directed on package: start with two-'5mg'$  tablets twice daily for 7 days. On day 8, switch to one-'5mg'$  tablet twice daily.   aspirin EC 81 MG tablet Take 81 mg by mouth daily. Swallow whole.   atorvastatin 80 MG tablet Commonly known as: LIPITOR Take 1 tablet by mouth once daily   carvedilol 25 MG tablet Commonly known as: COREG Take 1 tablet (25 mg total) by mouth 2 (two) times daily.   colchicine 0.6 MG tablet Take 1 tablet (0.6 mg total) by mouth daily. Take 1 tablet TID until GI upset or flare subsides, then for maintenance, take 1 tablet daily.   esomeprazole 40 MG capsule Commonly known as: NEXIUM Take 40 mg by mouth daily in the afternoon.   furosemide 20 MG tablet Commonly known as: LASIX Take 1 tablet (20 mg total) by mouth daily as needed.   gabapentin 100 MG capsule Commonly known as: NEURONTIN Take 100 mg by mouth 3 (three) times daily as needed.   Gemtesa 75 MG Tabs Generic drug: Vibegron Take 75 mg by mouth daily.   isosorbide mononitrate 30 MG 24 hr  tablet Commonly known as: IMDUR Take 1 tablet (30 mg total) by mouth in the morning and at bedtime.   lidocaine 5 % Commonly known as: LIDODERM Place 1 patch onto the skin daily. Remove & Discard patch within 12 hours or as directed by MD   losartan-hydrochlorothiazide 50-12.5 MG tablet Commonly known as: HYZAAR Take 1/2 tablet once a day.   methylPREDNISolone 4 MG Tbpk tablet Commonly known as: MEDROL DOSEPAK 6 day dose pack - take as directed   multivitamin with minerals tablet Take 1 tablet by mouth daily.  nitroGLYCERIN 0.4 MG SL tablet Commonly known as: NITROSTAT Place 1 tablet (0.4 mg total) under the tongue every 5 (five) minutes x 3 doses as needed for chest pain.   ondansetron 4 MG disintegrating tablet Commonly known as: ZOFRAN-ODT Take 1 tablet (4 mg total) by mouth every 8 (eight) hours as needed for nausea or vomiting.   potassium chloride 10 MEQ tablet Commonly known as: KLOR-CON Take 1 tablet (10 mEq total) by mouth daily as needed (when taking furosemide).   traMADol 50 MG tablet Commonly known as: ULTRAM Take 50 mg by mouth daily.        Allergies:  Allergies  Allergen Reactions   Morphine And Related Nausea Only   Prednisone Nausea And Vomiting   Amlodipine Palpitations   Latex Rash    Family History: Family History  Problem Relation Age of Onset   Colon cancer Mother    Cerebral aneurysm Father    Heart attack Father    Heart attack Brother 64   Healthy Son     Social History:  reports that she has never smoked. She has never been exposed to tobacco smoke. She has never used smokeless tobacco. She reports that she does not drink alcohol and does not use drugs.   Physical Exam: There were no vitals taken for this visit.  Constitutional:  Well nourished. Alert and oriented, No acute distress. HEENT: [place] AT, moist mucus membranes.  Trachea midline, no masses. Cardiovascular: No clubbing, cyanosis, or edema. Respiratory: Normal  respiratory effort, no increased work of breathing. GU: No CVA tenderness.  No bladder fullness or masses. Vulvovaginal atrophy w/ pallor, loss of rugae, introital retraction, excoriations.  Vulvar thinning, fusion of labia, clitoral hood retraction, prominent urethral meatus.   *** external genitalia, *** pubic hair distribution, no lesions.  Normal urethral meatus, no lesions, no prolapse, no discharge.   No urethral masses, tenderness and/or tenderness. No bladder fullness, tenderness or masses. *** vagina mucosa, *** estrogen effect, no discharge, no lesions, *** pelvic support, *** cystocele and *** rectocele noted.  No cervical motion tenderness.  Uterus is freely mobile and non-fixed.  No adnexal/parametria masses or tenderness noted.  Anus and perineum are without rashes or lesions.   ***  Neurologic: Grossly intact, no focal deficits, moving all 4 extremities. Psychiatric: Normal mood and affect.    Laboratory Data:    Latest Ref Rng & Units 09/13/2022    3:59 PM 08/09/2022   10:34 AM 08/27/2021    5:11 AM  CBC  WBC 4.0 - 10.5 K/uL 7.7  9.3  5.8   Hemoglobin 12.0 - 15.0 g/dL 14.0  14.6  15.2   Hematocrit 36.0 - 46.0 % 43.9  43.7  43.6   Platelets 150 - 400 K/uL 269  306  242       Latest Ref Rng & Units 09/13/2022    3:59 PM 08/23/2022    1:11 PM 08/09/2022   10:34 AM  CMP  Glucose 70 - 99 mg/dL 98  117  112   BUN 8 - 23 mg/dL '20  20  19   '$ Creatinine 0.44 - 1.00 mg/dL 0.88  0.85  0.96   Sodium 135 - 145 mmol/L 139  146  141   Potassium 3.5 - 5.1 mmol/L 3.9  4.9  4.5   Chloride 98 - 111 mmol/L 105  106  103   CO2 22 - 32 mmol/L '25  25  25   '$ Calcium 8.9 - 10.3 mg/dL 9.4  9.7  9.7   Total Protein 6.0 - 8.5 g/dL  6.5  6.4   Total Bilirubin 0.0 - 1.2 mg/dL  0.3  0.8   Alkaline Phos 44 - 121 IU/L  151    AST 0 - 40 IU/L  21  21   ALT 0 - 32 IU/L  23  27    I have reviewed the labs.   Pertinent Imaging: ***  Assessment & Plan:    1. Nocturia  - I explained to the patient  that nocturia is often multi-factorial and difficult to treat.  Sleeping disorders, heart conditions, peripheral vascular disease, diabetes, an enlarged prostate for men, an urethral stricture causing bladder outlet obstruction and/or certain medications can contribute to nocturia.  - I have suggested that the patient avoid caffeine after noon and alcohol in the evening.  He or she may also benefit from fluid restrictions after 6:00 in the evening and voiding just prior to bedtime.  - I have explained that research studies have showed that over 84% of patients with sleep apnea reported frequent nighttime urination.   With sleep apnea, oxygen decreases, carbon dioxide increases, the blood become more acidic, the heart rate drops and blood vessels in the lung constrict.  The body is then alerted that something is very wrong. The sleeper must wake enough to reopen the airway. By this time, the heart is racing and experiences a false signal of fluid overload. The heart excretes a hormone-like protein that tells the body to get rid of sodium and water, resulting in nocturia.  -  I also informed the patient that a recent study noted that decreasing sodium intake to 2.3 grams daily, if they don't have issues with hyponatremia, can also reduce the number of nightly voids  - There is also an increased incidence in sleep apnea with menopause, symptoms include night sweats, daytime sleepiness, depressed mood, and cognitive complaints like poor concentration or problems with short-term memory ***  - The patient may benefit from a discussion with his or her primary care physician to see if he or she has risk factors for sleep apnea or other sleep disturbances and obtaining a sleep study.   2. 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 Renal cyst  - Appreciated on RUS    No follow-ups on file.  Kahmari Koller, [place] [address]  [place], [place] [address] [phone]

## 2022-09-20 ENCOUNTER — Ambulatory Visit: Payer: Medicare Other | Admitting: Urology

## 2022-09-20 DIAGNOSIS — R351 Nocturia: Secondary | ICD-10-CM

## 2022-09-20 DIAGNOSIS — Z905 Acquired absence of kidney: Secondary | ICD-10-CM

## 2022-09-24 ENCOUNTER — Encounter (INDEPENDENT_AMBULATORY_CARE_PROVIDER_SITE_OTHER): Payer: Self-pay

## 2022-09-25 ENCOUNTER — Encounter (INDEPENDENT_AMBULATORY_CARE_PROVIDER_SITE_OTHER): Payer: Self-pay | Admitting: Vascular Surgery

## 2022-09-25 ENCOUNTER — Ambulatory Visit (INDEPENDENT_AMBULATORY_CARE_PROVIDER_SITE_OTHER): Payer: Medicare Other | Admitting: Vascular Surgery

## 2022-09-25 VITALS — BP 178/83 | HR 62 | Resp 16 | Wt 270.0 lb

## 2022-09-25 DIAGNOSIS — E782 Mixed hyperlipidemia: Secondary | ICD-10-CM

## 2022-09-25 DIAGNOSIS — I82409 Acute embolism and thrombosis of unspecified deep veins of unspecified lower extremity: Secondary | ICD-10-CM | POA: Insufficient documentation

## 2022-09-25 DIAGNOSIS — I2510 Atherosclerotic heart disease of native coronary artery without angina pectoris: Secondary | ICD-10-CM

## 2022-09-25 DIAGNOSIS — Z86718 Personal history of other venous thrombosis and embolism: Secondary | ICD-10-CM | POA: Insufficient documentation

## 2022-09-25 DIAGNOSIS — I10 Essential (primary) hypertension: Secondary | ICD-10-CM

## 2022-09-25 DIAGNOSIS — I82411 Acute embolism and thrombosis of right femoral vein: Secondary | ICD-10-CM

## 2022-09-25 NOTE — H&P (View-Only) (Signed)
MRN : [id number]  Carrie Larsen is a 76 y.o. (1946-02-12) female who presents with chief complaint of  Chief Complaint  Patient presents with   Follow-up    2 week follow up  .  History of Present Illness: Patient is referred from the [facility] ER for a right lower extremity DVT.  The patient has been having symptoms in the range of 2 to 3 weeks.  Her symptoms have largely been pain and swelling in the right leg.  She had an ultrasound 11 days ago which demonstrated right lower extremity DVT involving the tibial, popliteal, and femoral veins.  She was started on anticoagulation and has not had significant improvement in her symptoms of pain and swelling on anticoagulation.  She denies any chest pain or shortness of breath.  No fevers or chills.  This is very much hampering her and she has not been able to do her normal activities although she is continuing to try to work.  No previous history of DVT prior to this episode.  Current Outpatient Medications  Medication Sig Dispense Refill   allopurinol (ZYLOPRIM) 100 MG tablet Take 1 tablet (100 mg total) by mouth daily. 90 tablet 3   APIXABAN (ELIQUIS) VTE STARTER PACK (10MG AND 5MG) Take as directed on package: start with two-54m tablets twice daily for 7 days. On day 8, switch to one-547mtablet twice daily. 1 each 0   aspirin EC 81 MG tablet Take 81 mg by mouth daily. Swallow whole.     atorvastatin (LIPITOR) 80 MG tablet Take 1 tablet by mouth once daily 90 tablet 3   carvedilol (COREG) 25 MG tablet Take 1 tablet (25 mg total) by mouth 2 (two) times daily. 180 tablet 1   colchicine 0.6 MG tablet Take 1 tablet (0.6 mg total) by mouth daily. Take 1 tablet TID until GI upset or flare subsides, then for maintenance, take 1 tablet daily. 90 tablet 3   esomeprazole (NEXIUM) 40 MG capsule Take 40 mg by mouth daily in the afternoon.     furosemide (LASIX) 20 MG tablet Take 1 tablet (20 mg total) by mouth daily as needed. 30 tablet 1   gabapentin  (NEURONTIN) 100 MG capsule Take 100 mg by mouth 3 (three) times daily as needed.     isosorbide mononitrate (IMDUR) 30 MG 24 hr tablet Take 1 tablet (30 mg total) by mouth in the morning and at bedtime. 60 tablet 3   lidocaine (LIDODERM) 5 % Place 1 patch onto the skin daily. Remove & Discard patch within 12 hours or as directed by MD     Multiple Vitamins-Minerals (MULTIVITAMIN WITH MINERALS) tablet Take 1 tablet by mouth daily.     nitroGLYCERIN (NITROSTAT) 0.4 MG SL tablet Place 1 tablet (0.4 mg total) under the tongue every 5 (five) minutes x 3 doses as needed for chest pain. 25 tablet 1   ondansetron (ZOFRAN-ODT) 4 MG disintegrating tablet Take 1 tablet (4 mg total) by mouth every 8 (eight) hours as needed for nausea or vomiting. 12 tablet 0   traMADol (ULTRAM) 50 MG tablet Take 50 mg by mouth daily.      Vibegron (GEMTESA) 75 MG TABS Take 75 mg by mouth daily. 28 tablet 0   losartan-hydrochlorothiazide (HYZAAR) 50-12.5 MG tablet Take 1/2 tablet once a day. (Patient not taking: Reported on 09/25/2022) 90 tablet 1   methylPREDNISolone (MEDROL DOSEPAK) 4 MG TBPK tablet 6 day dose pack - take as directed (Patient not taking: Reported  on 09/25/2022) 21 tablet 0   potassium chloride (KLOR-CON) 10 MEQ tablet Take 1 tablet (10 mEq total) by mouth daily as needed (when taking furosemide). 30 tablet 1   Current Facility-Administered Medications  Medication Dose Route Frequency Provider Last Rate Last Admin   betamethasone acetate-betamethasone sodium phosphate (CELESTONE) injection 3 mg  3 mg Intra-articular Once Evans, Brent M, DPM       dexamethasone (DECADRON) injection 4 mg  4 mg Other Once Criselda Peaches, DPM       triamcinolone acetonide (KENALOG) 10 MG/ML injection 10 mg  10 mg Other Once Criselda Peaches, DPM        Past Medical History:  Diagnosis Date   Anxiety    Arthritis    osteoarthritis   CAD (coronary artery disease)    a. 2012 ETT: no ischemia;  b. 06/2017 NSTEMI/PCI: LM nl,  LAD nl, D1 70-80p, LCX nl, OM1 90 (3.0 x 23 Xience Alpine), RCA dominant, 100p/m, fills via L->R collats, EF 55-65%.   Cancer [facility])    a. s/p partial left nephrectomy.   Chicken pox    CME (cystoid macular edema), left 11/20/2018   Colon cancer ([place]) 1992   T3, N1, M0. colon    Colon polyp 2011   COVID-19 50/3546   Diastolic dysfunction    a. 08/2015 Echo: EF 60-65%, no rwma, Gr1 DD, midlly dil LA, PASP 30mHg. No significant valvular dzs; b. 06/2017 Echo: EF 60-65%, Gr1DD, mildly dil LA.   Essential hypertension    GERD (gastroesophageal reflux disease)    Gout    History of appendectomy 06/07/2015   History of kidney cancer 11/20/2018   History of kidney stones    passed - 2   Hyperlipidemia    Lumbar spinal stenosis    [place]    [place]   Nonexudative age-related macular degeneration, bilateral, early dry stage 11/20/2018   Obesity    Prediabetes    Pseudophakia of both eyes    Retinal cyst     Past Surgical History:  Procedure Laterality Date   ABDOMINAL HYSTERECTOMY     APPENDECTOMY  06/07/2015   CARDIAC CATHETERIZATION     CATARACT EXTRACTION W/PHACO Left 04/24/2016   Procedure: CATARACT EXTRACTION PHACO AND INTRAOCULAR LENS PLACEMENT [place];  Surgeon: WBirder Robson MD;  Location: [facility] ORS;  Service: Ophthalmology;  Laterality: Left;  [place]45.[place] AP% 16.4 CDE 7.53 FLUID PACK LOT # [id number]   CATARACT EXTRACTION W/PHACO Right 06/05/2016   Procedure: CATARACT EXTRACTION PHACO AND INTRAOCULAR LENS PLACEMENT (I[facility]);  Surgeon: WBirder Robson MD;  Location: [facility] ORS;  Service: Ophthalmology;  Laterality: Right;  [place]25.9 AP% 21.9 CDE 5.68 Fluid pack lot # [id number]   COLON RESECTION  03/04/1991   COLON SURGERY     COLONOSCOPY  03-14-10   Dr BBary Castilla tubular adenoma at 25 cm.   COLONOSCOPY WITH PROPOFOL N/A 06/07/2015   Procedure: COLONOSCOPY WITH PROPOFOL;  Surgeon: JRobert Bellow MD;  [facility];  Service: Endoscopy;  Laterality: N/A;    COLONOSCOPY WITH PROPOFOL N/A 08/10/2020   Procedure: COLONOSCOPY WITH PROPOFOL;  Surgeon: BRobert Bellow MD;  Location: [facility] ENDOSCOPY;  Service: Endoscopy;  Laterality: N/A;   CORONARY STENT INTERVENTION N/A 06/28/2017   Procedure: CORONARY STENT INTERVENTION;  Surgeon: CYolonda Kida MD;  [place] [facility];  Service: Cardiovascular;  Laterality: N/A;   EYE SURGERY     lens eye surgery   HERNIA REPAIR  [id number]  umbilical  LEFT HEART CATH AND CORONARY ANGIOGRAPHY N/A 06/28/2017   Procedure: LEFT HEART CATH AND CORONARY ANGIOGRAPHY;  Surgeon: Minna Merritts, MD;  Location: [place] CV LAB;  Service: Cardiovascular;  Laterality: N/A;   LUMBAR LAMINECTOMY/DECOMPRESSION MICRODISCECTOMY Bilateral 03/10/2018   Procedure: Laminectomy and Foraminotomy - Lumbar three-Lumbar four - Lumbar four-Lumbar five - bilateral;  Surgeon: Earnie Larsson, MD;  Location: [place];  Service: Neurosurgery;  Laterality: Bilateral;   NEPHRECTOMY  1992   left- cancer   RIB RESECTION     removal 4 ribs   TONSILLECTOMY       Social History   Tobacco Use   Smoking status: Never    Passive exposure: Never   Smokeless tobacco: Never  Vaping Use   Vaping Use: Never used  Substance Use Topics   Alcohol use: No   Drug use: No      Family History  Problem Relation Age of Onset   Colon cancer Mother    Cerebral aneurysm Father    Heart attack Father    Heart attack Brother 64   Healthy Son      Allergies  Allergen Reactions   Morphine And Related Nausea Only   Prednisone Nausea And Vomiting   Amlodipine Palpitations   Latex Rash     REVIEW OF SYSTEMS (Negative unless checked)  Constitutional: _0 Weight loss  _1 Fever  _2 Chills Cardiac: _3 Chest pain   _4 Chest pressure   _5 Palpitations   _6 Shortness of breath when laying flat   _7 Shortness of breath at rest   _8 Shortness of breath with exertion. Vascular:  _9 Pain in legs with walking   _10 Pain in legs at rest   _11 Pain in legs  when laying flat   _12 Claudication   _13 Pain in feet when walking  _14 Pain in feet at rest  _15 Pain in feet when laying flat   _16 History of DVT   _17 Phlebitis   _18 Swelling in legs   _19 Varicose veins   _20 Non-healing ulcers Pulmonary:   _21 Uses home oxygen   _22 Productive cough   _23 Hemoptysis   _24 Wheeze  _25 COPD   _26 Asthma Neurologic:  _27 Dizziness  _28 Blackouts   _29 Seizures   _30 History of stroke   _31 History of TIA  _32 Aphasia   _33 Temporary blindness   _34 Dysphagia   _35 Weakness or numbness in arms   _36 Weakness or numbness in legs Musculoskeletal:  _37 Arthritis   _38 Joint swelling   _39 Joint pain   _40 Low back pain Hematologic:  _41 Easy bruising  _42 Easy bleeding   _43 Hypercoagulable state   _44 Anemic   Gastrointestinal:  _45 Blood in stool   _46 Vomiting blood  _47 Gastroesophageal reflux/heartburn   _48 Abdominal pain Genitourinary:  _49 Chronic kidney disease   _50 Difficult urination  _51 Frequent urination  _52 Burning with urination   _53 Hematuria Skin:  _54 Rashes   _55 Ulcers   _56 Wounds Psychological:  _57 History of anxiety   _58  History of major depression.  Physical Examination  BP (!) 178/83 (BP Location: Left Arm)   Pulse 62   Resp 16   Wt 270 lb (122.5 kg)   BMI 38.74 kg/m  Gen:  WD/WN, NAD Head: [place]/AT, No temporalis wasting. Ear/Nose/Throat: Hearing grossly intact, nares w/o erythema or drainage Eyes: Conjunctiva clear. Sclera non-icteric Neck: Supple.  Trachea midline Pulmonary:  Good air movement, no use of accessory muscles.  Cardiac: RRR, no JVD Vascular:  Vessel Right Left  Radial Palpable Palpable           Musculoskeletal: M/S 5/5 throughout.  No deformity or atrophy.  2+ right lower extremity edema.  Walking with a walker Neurologic:  Sensation grossly intact in extremities.  Symmetrical.  Speech is fluent.  Psychiatric: Judgment intact, Mood & affect appropriate for pt's clinical situation. Dermatologic: No rashes or ulcers noted.  No cellulitis or open wounds.      Labs Recent Results (from  the past 2160 hour(s))  CBC with Differential/Platelet     Status: None   Collection Time: 08/09/22 10:34 AM  Result Value Ref Range   WBC 9.3 3.8 - 10.8 Thousand/uL   RBC 4.92 3.80 - 5.10 Million/uL   Hemoglobin 14.6 11.7 - 15.5 g/dL   HCT 43.7 35.0 - 45.0 %   MCV 88.8 80.0 - 100.0 fL   MCH 29.7 27.0 - 33.0 pg   MCHC 33.4 32.0 - 36.0 g/dL   RDW 13.2 11.0 - 15.0 %   Platelets 306 140 - 400 Thousand/uL   MPV 10.6 7.5 - 12.5 fL   Neutro Abs 6,268 1,500 - 7,800 cells/uL   Lymphs Abs 1,907 850 - 3,900 cells/uL   Absolute Monocytes 902 200 - 950 cells/uL   Eosinophils Absolute 149 15 - 500 cells/uL   Basophils Absolute 74 0 - 200 cells/uL   Neutrophils Relative % 67.4 %   Total Lymphocyte 20.5 %   Monocytes Relative 9.7 %   Eosinophils Relative 1.6 %   Basophils Relative 0.8 %  COMPLETE METABOLIC PANEL WITH GFR     Status: Abnormal   Collection Time: 08/09/22 10:34 AM  Result Value Ref Range   Glucose, Bld 112 (H) 65 - 99 mg/dL    Comment: .            Fasting reference interval . For someone without known diabetes, a glucose value between 100 and 125 mg/dL is consistent with prediabetes and should be confirmed with a follow-up test. .    BUN 19 7 - 25 mg/dL   Creat 0.96 0.60 - 1.00 mg/dL   eGFR 61 > OR = 60 mL/min/1.5m   BUN/Creatinine Ratio SEE NOTE: 6 - 22 (calc)    Comment:    Not Reported: BUN and Creatinine are within    reference range. .    Sodium 141 135 - 146 mmol/L   Potassium 4.5 3.5 - 5.3 mmol/L   Chloride 103 98 - 110 mmol/L   CO2 25 20 - 32 mmol/L   Calcium 9.7 8.6 - 10.4 mg/dL   Total Protein 6.4 6.1 - 8.1 g/dL   Albumin 4.1 3.6 - 5.1 g/dL   Globulin 2.3 1.9 - 3.7 g/dL (calc)   AG Ratio 1.8 1.0 - 2.5 (calc)   Total Bilirubin 0.8 0.2 - 1.2 mg/dL   Alkaline phosphatase (APISO) 126 37 - 153 U/L   AST 21 10 - 35 U/L   ALT 27 6 - 29 U/L  Lipid panel     Status: Abnormal   Collection Time: 08/09/22 10:34 AM  Result Value Ref Range   Cholesterol 191  <200 mg/dL   HDL 53 > OR = 50 mg/dL   Triglycerides 114 <150 mg/dL   LDL Cholesterol (Calc) 116 (H) mg/dL (calc)    Comment: Reference range: <100 . Desirable range <100 mg/dL for primary prevention;   <70 mg/dL for patients with CHD or diabetic patients  with > or = 2 CHD risk factors. .Marland KitchenLDL-C is now calculated using the Martin-Hopkins  calculation, which is a validated novel method providing  better accuracy than the Friedewald equation in the  estimation of LDL-C.  MCresenciano Genreet al. JAnnamaria Helling 25638;756(43: [phone] (  http://education.[employer].com/faq/FAQ164)    Total CHOL/HDL Ratio 3.6 <5.0 (calc)   Non-HDL Cholesterol (Calc) 138 (H) <130 mg/dL (calc)    Comment: For patients with diabetes plus 1 major ASCVD risk  factor, treating to a non-HDL-C goal of <100 mg/dL  (LDL-C of <70 mg/dL) is considered a therapeutic  option.   TSH     Status: None   Collection Time: 08/09/22 10:34 AM  Result Value Ref Range   TSH 2.34 0.40 - 4.50 mIU/L  Hemoglobin A1c     Status: Abnormal   Collection Time: 08/09/22 10:34 AM  Result Value Ref Range   Hgb A1c MFr Bld 5.9 (H) <5.7 % of total Hgb    Comment: For someone without known diabetes, a hemoglobin  A1c value between 5.7% and 6.4% is consistent with prediabetes and should be confirmed with a  follow-up test. . For someone with known diabetes, a value <7% indicates that their diabetes is well controlled. A1c targets should be individualized based on duration of diabetes, age, comorbid conditions, and other considerations. . This assay result is consistent with an increased risk of diabetes. . Currently, no consensus exists regarding use of hemoglobin A1c for diagnosis of diabetes for children. .    Mean Plasma Glucose 123 mg/dL   eAG (mmol/L) 6.8 mmol/L  Uric acid     Status: Abnormal   Collection Time: 08/09/22 10:34 AM  Result Value Ref Range   Uric Acid, Serum 8.2 (H) 2.5 - 7.0 mg/dL    Comment: Therapeutic target  for gout patients: <6.0 mg/dL .   Urinalysis, Routine w reflex microscopic     Status: Abnormal   Collection Time: 08/13/22  6:31 PM  Result Value Ref Range   Color, Urine YELLOW YELLOW   APPearance HAZY (A) CLEAR   Specific Gravity, Urine 1.025 1.005 - 1.030   pH 5.5 5.0 - 8.0   Glucose, UA NEGATIVE NEGATIVE mg/dL   Hgb urine dipstick NEGATIVE NEGATIVE   Bilirubin Urine SMALL (A) NEGATIVE   Ketones, ur NEGATIVE NEGATIVE mg/dL   Protein, ur NEGATIVE NEGATIVE mg/dL   Nitrite NEGATIVE NEGATIVE   Leukocytes,Ua MODERATE (A) NEGATIVE    Comment: Performed at [facility], [address] [place], [place] [address]  Urinalysis, Microscopic (reflex)     Status: Abnormal   Collection Time: 08/13/22  6:31 PM  Result Value Ref Range   RBC / HPF 0-5 0 - 5 RBC/hpf   WBC, UA 21-50 0 - 5 WBC/hpf   Bacteria, UA FEW (A) NONE SEEN   Squamous Epithelial / LPF 6-10 0 - 5   Mucus PRESENT     Comment: Performed at [facility] [place], [address] [place], [place] [address]  Urine Culture     Status: None   Collection Time: 08/13/22  6:31 PM   Specimen: Urine, Clean Catch  Result Value Ref Range   Specimen Description      URINE, CLEAN CATCH Performed at [facility], [address], [place], [place] [address]    Special Requests      NONE Performed at [facility], [address] [place], [place] [address]    Culture      NO GROWTH Performed at [facility], [place] [address] [place], [place] [address]    Report Status 08/14/2022 FINAL   Comprehensive metabolic panel     Status: Abnormal   Collection Time: 08/23/22  1:11 PM  Result Value Ref Range  Glucose 117 (H) 70 - 99 mg/dL   BUN 20 8 - 27 mg/dL   Creatinine, Ser 0.85 0.57 - 1.00 mg/dL   eGFR 71 >59 mL/min/1.73   BUN/Creatinine Ratio 24 12 - 28   Sodium 146 (H) 134 - 144 mmol/L   Potassium 4.9 3.5 - 5.2 mmol/L   Chloride 106 96 - 106 mmol/L   CO2 25 20 - 29 mmol/L   Calcium  9.7 8.7 - 10.3 mg/dL   Total Protein 6.5 6.0 - 8.5 g/dL   Albumin 4.2 3.8 - 4.8 g/dL   Globulin, Total 2.3 1.5 - 4.5 g/dL   Albumin/Globulin Ratio 1.8 1.2 - 2.2   Bilirubin Total 0.3 0.0 - 1.2 mg/dL   Alkaline Phosphatase 151 (H) 44 - 121 IU/L   AST 21 0 - 40 IU/L   ALT 23 0 - 32 IU/L  D-dimer, quantitative     Status: Abnormal   Collection Time: 08/23/22  1:11 PM  Result Value Ref Range   D-DIMER 5.50 (H) 0.00 - 0.49 mg/L FEU    Comment: According to the assay manufacturer's published package insert, a normal (<0.50 mg/L FEU) D-dimer result in conjunction with a non-high clinical probability assessment, excludes deep vein thrombosis (DVT) and pulmonary embolism (PE) with high sensitivity. D-dimer values increase with age and this can make VTE exclusion of an older population difficult. To address this, the [place], based on best available evidence and recent guidelines, recommends that clinicians use age-adjusted D-dimer thresholds in patients greater than 60 years of age with: a) a low probability of PE who do not meet all Pulmonary Embolism Rule Out Criteria, or b) in those with intermediate probability of PE. The formula for an age-adjusted D-dimer cut-off is "age/100". For example, a 76 year old patient would have an age-adjusted cut-off of 0.60 mg/L FEU and an 77 year old 0.80 mg/L FEU.   Uric acid     Status: None   Collection Time: 08/23/22  1:11 PM  Result Value Ref Range   Uric Acid 5.6 3.1 - 7.9 mg/dL    Comment:            Therapeutic target for gout patients: <6.0  Urinalysis, Complete     Status: Abnormal   Collection Time: 08/29/22  2:25 PM  Result Value Ref Range   Specific Gravity, UA 1.030 1.005 - 1.030   pH, UA 5.0 5.0 - 7.5   Color, UA Yellow Yellow   Appearance Ur Clear Clear   Leukocytes,UA 1+ (A) Negative   Protein,UA Negative Negative/Trace   Glucose, UA Negative Negative   Ketones, UA Negative Negative   RBC, UA Negative  Negative   Bilirubin, UA Negative Negative   Urobilinogen, Ur 0.2 0.2 - 1.0 mg/dL   Nitrite, UA Negative Negative   Microscopic Examination See below:   Microscopic Examination     Status: Abnormal   Collection Time: 08/29/22  2:25 PM   Urine  Result Value Ref Range   WBC, UA 11-30 (A) 0 - 5 /hpf   RBC, Urine 0-2 0 - 2 /hpf   Epithelial Cells (non renal) 0-10 0 - 10 /hpf   Mucus, UA Present (A) Not Estab.   Bacteria, UA Moderate (A) None seen/Few  Bladder Scan (Post Void Residual) in office     Status: None   Collection Time: 08/29/22  3:10 PM  Result Value Ref Range   Scan Result 40m   CBC     Status: None  Collection Time: 09/13/22  3:59 PM  Result Value Ref Range   WBC 7.7 4.0 - 10.5 K/uL   RBC 4.81 3.87 - 5.11 MIL/uL   Hemoglobin 14.0 12.0 - 15.0 g/dL   HCT 43.9 36.0 - 46.0 %   MCV 91.3 80.0 - 100.0 fL   MCH 29.1 26.0 - 34.0 pg   MCHC 31.9 30.0 - 36.0 g/dL   RDW 13.6 11.5 - 15.5 %   Platelets 269 150 - 400 K/uL   nRBC 0.0 0.0 - 0.2 %    Comment: Performed at [facility], [address], [place], [place] [address]  Basic metabolic panel     Status: None   Collection Time: 09/13/22  3:59 PM  Result Value Ref Range   Sodium 139 135 - 145 mmol/L   Potassium 3.9 3.5 - 5.1 mmol/L   Chloride 105 98 - 111 mmol/L   CO2 25 22 - 32 mmol/L   Glucose, Bld 98 70 - 99 mg/dL    Comment: Glucose reference range applies only to samples taken after fasting for at least 8 hours.   BUN 20 8 - 23 mg/dL   Creatinine, Ser 0.88 0.44 - 1.00 mg/dL   Calcium 9.4 8.9 - 10.3 mg/dL   GFR, Estimated >60 >60 mL/min    Comment: (NOTE) Calculated using the CKD-EPI Creatinine Equation (2021)    Anion gap 9 5 - 15    Comment: Performed at [facility], [address] [place], [place] [address]    Radiology VAS [place] LOWER EXTREMITY VENOUS (DVT)  Result Date: 09/14/2022  Lower Venous DVT Study Patient Name:  [facility]  Date of Exam:   09/13/2022 Medical Rec #:  [id number]       Accession #:    [id number] Date of Birth: 1946/05/05       Patient Gender: F Patient Age:   68 years Exam Location:  [facility] Procedure:      VAS [place] LOWER EXTREMITY VENOUS (DVT) Referring Phys: MAX HYATT --------------------------------------------------------------------------------  Indications: Swelling, and right.  Performing Technologist: Concha Norway RVT  Examination Guidelines: A complete evaluation includes B-mode imaging, spectral Doppler, color Doppler, and power Doppler as needed of all accessible portions of each vessel. Bilateral testing is considered an integral part of a complete examination. Limited examinations for reoccurring indications may be performed as noted. The reflux portion of the exam is performed with the patient in reverse Trendelenburg.  +---------+---------------+---------+-----------+---------------+--------------+ RIGHT    CompressibilityPhasicitySpontaneityProperties     Thrombus Aging +---------+---------------+---------+-----------+---------------+--------------+ CFV      Full           Yes      Yes                                      +---------+---------------+---------+-----------+---------------+--------------+ SFJ      Full           Yes      Yes                                      +---------+---------------+---------+-----------+---------------+--------------+ FV Prox  Partial                            partially  re-cannalized                 +---------+---------------+---------+-----------+---------------+--------------+ FV Mid   None                                                             +---------+---------------+---------+-----------+---------------+--------------+ FV DistalNone                                                             +---------+---------------+---------+-----------+---------------+--------------+ PFV       Full           Yes      Yes                                      +---------+---------------+---------+-----------+---------------+--------------+ POP      Partial                            partially                                                                 re-cannalized                 +---------+---------------+---------+-----------+---------------+--------------+ PTV      Partial                            partially                                                                 re-cannalized                 +---------+---------------+---------+-----------+---------------+--------------+ PERO     Full           Yes      Yes                                      +---------+---------------+---------+-----------+---------------+--------------+ Gastroc  None                                                             +---------+---------------+---------+-----------+---------------+--------------+ GSV      Full           Yes      Yes                                      +---------+---------------+---------+-----------+---------------+--------------+  +------+---------------+---------+-----------+----------+--------------+  LEFT  CompressibilityPhasicitySpontaneityPropertiesThrombus Aging +------+---------------+---------+-----------+----------+--------------+ CFV   Full           Yes      Yes                                 +------+---------------+---------+-----------+----------+--------------+ SFJ   Full           Yes      Yes                                 +------+---------------+---------+-----------+----------+--------------+ FV MidFull           Yes      Yes                                 +------+---------------+---------+-----------+----------+--------------+  Findings reported to [facility] the ordering physicians office at 1:57p. The patient was directed to the ER at [facility] office request.  Summary: RIGHT: -  Findings consistent with acute deep vein thrombosis involving the right femoral vein, right popliteal vein, right posterior tibial veins, and right gastrocnemius veins.   *See table(s) above for measurements and observations. Electronically signed by Leotis Pain MD on 09/14/2022 at 8:57:27 AM.    Final     Assessment/Plan  DVT (deep venous thrombosis) ([place]) The patient has an acute right lower extremity DVT which is fairly extensive and causing her a large amount of symptoms.  She has not had significant improvement with anticoagulation alone would be very interested in having thrombectomy or anything that may possibly improve her symptoms.  We discussed the chronicity of the situation and we are on the border of whether or not this would be of benefit, but with the larger devices were seeing benefit out sometimes beyond 3 weeks.  I discussed the risks and benefits of the procedure and the patient would desire to proceed.  She has requested Monday, November 6 and we have scheduled for this date.  HTN (hypertension) blood pressure control important in reducing the progression of atherosclerotic disease. On appropriate oral medications.   Hyperlipidemia lipid control important in reducing the progression of atherosclerotic disease. Continue statin therapy    Leotis Pain, MD  09/25/2022 4:28 PM    This note was created with Dragon medical transcription system.  Any errors from dictation are purely unintentional

## 2022-09-25 NOTE — Progress Notes (Signed)
MRN : [id number]  Carrie Larsen is a 76 y.o. (1946-02-12) female who presents with chief complaint of  Chief Complaint  Patient presents with   Follow-up    2 week follow up  .  History of Present Illness: Patient is referred from the [facility] ER for a right lower extremity DVT.  The patient has been having symptoms in the range of 2 to 3 weeks.  Her symptoms have largely been pain and swelling in the right leg.  She had an ultrasound 11 days ago which demonstrated right lower extremity DVT involving the tibial, popliteal, and femoral veins.  She was started on anticoagulation and has not had significant improvement in her symptoms of pain and swelling on anticoagulation.  She denies any chest pain or shortness of breath.  No fevers or chills.  This is very much hampering her and she has not been able to do her normal activities although she is continuing to try to work.  No previous history of DVT prior to this episode.  Current Outpatient Medications  Medication Sig Dispense Refill   allopurinol (ZYLOPRIM) 100 MG tablet Take 1 tablet (100 mg total) by mouth daily. 90 tablet 3   APIXABAN (ELIQUIS) VTE STARTER PACK (10MG AND 5MG) Take as directed on package: start with two-54m tablets twice daily for 7 days. On day 8, switch to one-547mtablet twice daily. 1 each 0   aspirin EC 81 MG tablet Take 81 mg by mouth daily. Swallow whole.     atorvastatin (LIPITOR) 80 MG tablet Take 1 tablet by mouth once daily 90 tablet 3   carvedilol (COREG) 25 MG tablet Take 1 tablet (25 mg total) by mouth 2 (two) times daily. 180 tablet 1   colchicine 0.6 MG tablet Take 1 tablet (0.6 mg total) by mouth daily. Take 1 tablet TID until GI upset or flare subsides, then for maintenance, take 1 tablet daily. 90 tablet 3   esomeprazole (NEXIUM) 40 MG capsule Take 40 mg by mouth daily in the afternoon.     furosemide (LASIX) 20 MG tablet Take 1 tablet (20 mg total) by mouth daily as needed. 30 tablet 1   gabapentin  (NEURONTIN) 100 MG capsule Take 100 mg by mouth 3 (three) times daily as needed.     isosorbide mononitrate (IMDUR) 30 MG 24 hr tablet Take 1 tablet (30 mg total) by mouth in the morning and at bedtime. 60 tablet 3   lidocaine (LIDODERM) 5 % Place 1 patch onto the skin daily. Remove & Discard patch within 12 hours or as directed by MD     Multiple Vitamins-Minerals (MULTIVITAMIN WITH MINERALS) tablet Take 1 tablet by mouth daily.     nitroGLYCERIN (NITROSTAT) 0.4 MG SL tablet Place 1 tablet (0.4 mg total) under the tongue every 5 (five) minutes x 3 doses as needed for chest pain. 25 tablet 1   ondansetron (ZOFRAN-ODT) 4 MG disintegrating tablet Take 1 tablet (4 mg total) by mouth every 8 (eight) hours as needed for nausea or vomiting. 12 tablet 0   traMADol (ULTRAM) 50 MG tablet Take 50 mg by mouth daily.      Vibegron (GEMTESA) 75 MG TABS Take 75 mg by mouth daily. 28 tablet 0   losartan-hydrochlorothiazide (HYZAAR) 50-12.5 MG tablet Take 1/2 tablet once a day. (Patient not taking: Reported on 09/25/2022) 90 tablet 1   methylPREDNISolone (MEDROL DOSEPAK) 4 MG TBPK tablet 6 day dose pack - take as directed (Patient not taking: Reported  on 09/25/2022) 21 tablet 0   potassium chloride (KLOR-CON) 10 MEQ tablet Take 1 tablet (10 mEq total) by mouth daily as needed (when taking furosemide). 30 tablet 1   Current Facility-Administered Medications  Medication Dose Route Frequency Provider Last Rate Last Admin   betamethasone acetate-betamethasone sodium phosphate (CELESTONE) injection 3 mg  3 mg Intra-articular Once Evans, Brent M, DPM       dexamethasone (DECADRON) injection 4 mg  4 mg Other Once Criselda Peaches, DPM       triamcinolone acetonide (KENALOG) 10 MG/ML injection 10 mg  10 mg Other Once Criselda Peaches, DPM        Past Medical History:  Diagnosis Date   Anxiety    Arthritis    osteoarthritis   CAD (coronary artery disease)    a. 2012 ETT: no ischemia;  b. 06/2017 NSTEMI/PCI: LM nl,  LAD nl, D1 70-80p, LCX nl, OM1 90 (3.0 x 23 Xience Alpine), RCA dominant, 100p/m, fills via L->R collats, EF 55-65%.   Cancer [facility])    a. s/p partial left nephrectomy.   Chicken pox    CME (cystoid macular edema), left 11/20/2018   Colon cancer ([place]) 1992   T3, N1, M0. colon    Colon polyp 2011   COVID-19 50/3546   Diastolic dysfunction    a. 08/2015 Echo: EF 60-65%, no rwma, Gr1 DD, midlly dil LA, PASP 30mHg. No significant valvular dzs; b. 06/2017 Echo: EF 60-65%, Gr1DD, mildly dil LA.   Essential hypertension    GERD (gastroesophageal reflux disease)    Gout    History of appendectomy 06/07/2015   History of kidney cancer 11/20/2018   History of kidney stones    passed - 2   Hyperlipidemia    Lumbar spinal stenosis    [place]    [place]   Nonexudative age-related macular degeneration, bilateral, early dry stage 11/20/2018   Obesity    Prediabetes    Pseudophakia of both eyes    Retinal cyst     Past Surgical History:  Procedure Laterality Date   ABDOMINAL HYSTERECTOMY     APPENDECTOMY  06/07/2015   CARDIAC CATHETERIZATION     CATARACT EXTRACTION W/PHACO Left 04/24/2016   Procedure: CATARACT EXTRACTION PHACO AND INTRAOCULAR LENS PLACEMENT [place];  Surgeon: WBirder Robson MD;  Location: [facility] ORS;  Service: Ophthalmology;  Laterality: Left;  [place]45.[place] AP% 16.4 CDE 7.53 FLUID PACK LOT # [id number]   CATARACT EXTRACTION W/PHACO Right 06/05/2016   Procedure: CATARACT EXTRACTION PHACO AND INTRAOCULAR LENS PLACEMENT (I[facility]);  Surgeon: WBirder Robson MD;  Location: [facility] ORS;  Service: Ophthalmology;  Laterality: Right;  [place]25.9 AP% 21.9 CDE 5.68 Fluid pack lot # [id number]   COLON RESECTION  03/04/1991   COLON SURGERY     COLONOSCOPY  03-14-10   Dr BBary Castilla tubular adenoma at 25 cm.   COLONOSCOPY WITH PROPOFOL N/A 06/07/2015   Procedure: COLONOSCOPY WITH PROPOFOL;  Surgeon: JRobert Bellow MD;  [facility];  Service: Endoscopy;  Laterality: N/A;    COLONOSCOPY WITH PROPOFOL N/A 08/10/2020   Procedure: COLONOSCOPY WITH PROPOFOL;  Surgeon: BRobert Bellow MD;  Location: [facility] ENDOSCOPY;  Service: Endoscopy;  Laterality: N/A;   CORONARY STENT INTERVENTION N/A 06/28/2017   Procedure: CORONARY STENT INTERVENTION;  Surgeon: CYolonda Kida MD;  [place];  Service: Cardiovascular;  Laterality: N/A;   EYE SURGERY     lens eye surgery   HERNIA REPAIR  [id number]  umbilical  LEFT HEART CATH AND CORONARY ANGIOGRAPHY N/A 06/28/2017   Procedure: LEFT HEART CATH AND CORONARY ANGIOGRAPHY;  Surgeon: Minna Merritts, MD;  Location: [place] CV LAB;  Service: Cardiovascular;  Laterality: N/A;   LUMBAR LAMINECTOMY/DECOMPRESSION MICRODISCECTOMY Bilateral 03/10/2018   Procedure: Laminectomy and Foraminotomy - Lumbar three-Lumbar four - Lumbar four-Lumbar five - bilateral;  Surgeon: Earnie Larsson, MD;  Location: [place];  Service: Neurosurgery;  Laterality: Bilateral;   NEPHRECTOMY  1992   left- cancer   RIB RESECTION     removal 4 ribs   TONSILLECTOMY       Social History   Tobacco Use   Smoking status: Never    Passive exposure: Never   Smokeless tobacco: Never  Vaping Use   Vaping Use: Never used  Substance Use Topics   Alcohol use: No   Drug use: No      Family History  Problem Relation Age of Onset   Colon cancer Mother    Cerebral aneurysm Father    Heart attack Father    Heart attack Brother 64   Healthy Son      Allergies  Allergen Reactions   Morphine And Related Nausea Only   Prednisone Nausea And Vomiting   Amlodipine Palpitations   Latex Rash     REVIEW OF SYSTEMS (Negative unless checked)  Constitutional: _0 Weight loss  _1 Fever  _2 Chills Cardiac: _3 Chest pain   _4 Chest pressure   _5 Palpitations   _6 Shortness of breath when laying flat   _7 Shortness of breath at rest   _8 Shortness of breath with exertion. Vascular:  _9 Pain in legs with walking   _10 Pain in legs at rest   _11 Pain in legs  when laying flat   _12 Claudication   _13 Pain in feet when walking  _14 Pain in feet at rest  _15 Pain in feet when laying flat   _16 History of DVT   _17 Phlebitis   _18 Swelling in legs   _19 Varicose veins   _20 Non-healing ulcers Pulmonary:   _21 Uses home oxygen   _22 Productive cough   _23 Hemoptysis   _24 Wheeze  _25 COPD   _26 Asthma Neurologic:  _27 Dizziness  _28 Blackouts   _29 Seizures   _30 History of stroke   _31 History of TIA  _32 Aphasia   _33 Temporary blindness   _34 Dysphagia   _35 Weakness or numbness in arms   _36 Weakness or numbness in legs Musculoskeletal:  _37 Arthritis   _38 Joint swelling   _39 Joint pain   _40 Low back pain Hematologic:  _41 Easy bruising  _42 Easy bleeding   _43 Hypercoagulable state   _44 Anemic   Gastrointestinal:  _45 Blood in stool   _46 Vomiting blood  _47 Gastroesophageal reflux/heartburn   _48 Abdominal pain Genitourinary:  _49 Chronic kidney disease   _50 Difficult urination  _51 Frequent urination  _52 Burning with urination   _53 Hematuria Skin:  _54 Rashes   _55 Ulcers   _56 Wounds Psychological:  _57 History of anxiety   _58  History of major depression.  Physical Examination  BP (!) 178/83 (BP Location: Left Arm)   Pulse 62   Resp 16   Wt 270 lb (122.5 kg)   BMI 38.74 kg/m  Gen:  WD/WN, NAD Head: /AT, No temporalis wasting. Ear/Nose/Throat: Hearing grossly intact, nares w/o erythema or drainage Eyes: Conjunctiva clear. Sclera non-icteric Neck: Supple.  Trachea midline Pulmonary:  Good air movement, no use of accessory muscles.  Cardiac: RRR, no JVD Vascular:  Vessel Right Left  Radial Palpable Palpable           Musculoskeletal: M/S 5/5 throughout.  No deformity or atrophy.  2+ right lower extremity edema.  Walking with a walker Neurologic:  Sensation grossly intact in extremities.  Symmetrical.  Speech is fluent.  Psychiatric: Judgment intact, Mood & affect appropriate for pt's clinical situation. Dermatologic: No rashes or ulcers noted.  No cellulitis or open wounds.      Labs Recent Results (from  the past 2160 hour(s))  CBC with Differential/Platelet     Status: None   Collection Time: 08/09/22 10:34 AM  Result Value Ref Range   WBC 9.3 3.8 - 10.8 Thousand/uL   RBC 4.92 3.80 - 5.10 Million/uL   Hemoglobin 14.6 11.7 - 15.5 g/dL   HCT 43.7 35.0 - 45.0 %   MCV 88.8 80.0 - 100.0 fL   MCH 29.7 27.0 - 33.0 pg   MCHC 33.4 32.0 - 36.0 g/dL   RDW 13.2 11.0 - 15.0 %   Platelets 306 140 - 400 Thousand/uL   MPV 10.6 7.5 - 12.5 fL   Neutro Abs 6,268 1,500 - 7,800 cells/uL   Lymphs Abs 1,907 850 - 3,900 cells/uL   Absolute Monocytes 902 200 - 950 cells/uL   Eosinophils Absolute 149 15 - 500 cells/uL   Basophils Absolute 74 0 - 200 cells/uL   Neutrophils Relative % 67.4 %   Total Lymphocyte 20.5 %   Monocytes Relative 9.7 %   Eosinophils Relative 1.6 %   Basophils Relative 0.8 %  COMPLETE METABOLIC PANEL WITH GFR     Status: Abnormal   Collection Time: 08/09/22 10:34 AM  Result Value Ref Range   Glucose, Bld 112 (H) 65 - 99 mg/dL    Comment: .            Fasting reference interval . For someone without known diabetes, a glucose value between 100 and 125 mg/dL is consistent with prediabetes and should be confirmed with a follow-up test. .    BUN 19 7 - 25 mg/dL   Creat 0.96 0.60 - 1.00 mg/dL   eGFR 61 > OR = 60 mL/min/1.5m   BUN/Creatinine Ratio SEE NOTE: 6 - 22 (calc)    Comment:    Not Reported: BUN and Creatinine are within    reference range. .    Sodium 141 135 - 146 mmol/L   Potassium 4.5 3.5 - 5.3 mmol/L   Chloride 103 98 - 110 mmol/L   CO2 25 20 - 32 mmol/L   Calcium 9.7 8.6 - 10.4 mg/dL   Total Protein 6.4 6.1 - 8.1 g/dL   Albumin 4.1 3.6 - 5.1 g/dL   Globulin 2.3 1.9 - 3.7 g/dL (calc)   AG Ratio 1.8 1.0 - 2.5 (calc)   Total Bilirubin 0.8 0.2 - 1.2 mg/dL   Alkaline phosphatase (APISO) 126 37 - 153 U/L   AST 21 10 - 35 U/L   ALT 27 6 - 29 U/L  Lipid panel     Status: Abnormal   Collection Time: 08/09/22 10:34 AM  Result Value Ref Range   Cholesterol 191  <200 mg/dL   HDL 53 > OR = 50 mg/dL   Triglycerides 114 <150 mg/dL   LDL Cholesterol (Calc) 116 (H) mg/dL (calc)    Comment: Reference range: <100 . Desirable range <100 mg/dL for primary prevention;   <70 mg/dL for patients with CHD or diabetic patients  with > or = 2 CHD risk factors. .Marland KitchenLDL-C is now calculated using the Martin-Hopkins  calculation, which is a validated novel method providing  better accuracy than the Friedewald equation in the  estimation of LDL-C.  MCresenciano Genreet al. JAnnamaria Helling 25638;756(43: [phone] (  http://education.[employer].com/faq/FAQ164)    Total CHOL/HDL Ratio 3.6 <5.0 (calc)   Non-HDL Cholesterol (Calc) 138 (H) <130 mg/dL (calc)    Comment: For patients with diabetes plus 1 major ASCVD risk  factor, treating to a non-HDL-C goal of <100 mg/dL  (LDL-C of <70 mg/dL) is considered a therapeutic  option.   TSH     Status: None   Collection Time: 08/09/22 10:34 AM  Result Value Ref Range   TSH 2.34 0.40 - 4.50 mIU/L  Hemoglobin A1c     Status: Abnormal   Collection Time: 08/09/22 10:34 AM  Result Value Ref Range   Hgb A1c MFr Bld 5.9 (H) <5.7 % of total Hgb    Comment: For someone without known diabetes, a hemoglobin  A1c value between 5.7% and 6.4% is consistent with prediabetes and should be confirmed with a  follow-up test. . For someone with known diabetes, a value <7% indicates that their diabetes is well controlled. A1c targets should be individualized based on duration of diabetes, age, comorbid conditions, and other considerations. . This assay result is consistent with an increased risk of diabetes. . Currently, no consensus exists regarding use of hemoglobin A1c for diagnosis of diabetes for children. .    Mean Plasma Glucose 123 mg/dL   eAG (mmol/L) 6.8 mmol/L  Uric acid     Status: Abnormal   Collection Time: 08/09/22 10:34 AM  Result Value Ref Range   Uric Acid, Serum 8.2 (H) 2.5 - 7.0 mg/dL    Comment: Therapeutic target  for gout patients: <6.0 mg/dL .   Urinalysis, Routine w reflex microscopic     Status: Abnormal   Collection Time: 08/13/22  6:31 PM  Result Value Ref Range   Color, Urine YELLOW YELLOW   APPearance HAZY (A) CLEAR   Specific Gravity, Urine 1.025 1.005 - 1.030   pH 5.5 5.0 - 8.0   Glucose, UA NEGATIVE NEGATIVE mg/dL   Hgb urine dipstick NEGATIVE NEGATIVE   Bilirubin Urine SMALL (A) NEGATIVE   Ketones, ur NEGATIVE NEGATIVE mg/dL   Protein, ur NEGATIVE NEGATIVE mg/dL   Nitrite NEGATIVE NEGATIVE   Leukocytes,Ua MODERATE (A) NEGATIVE    Comment: Performed at [facility], [address] [place], [place] [address]  Urinalysis, Microscopic (reflex)     Status: Abnormal   Collection Time: 08/13/22  6:31 PM  Result Value Ref Range   RBC / HPF 0-5 0 - 5 RBC/hpf   WBC, UA 21-50 0 - 5 WBC/hpf   Bacteria, UA FEW (A) NONE SEEN   Squamous Epithelial / LPF 6-10 0 - 5   Mucus PRESENT     Comment: Performed at [facility] [place], [address] [place], [place] [address]  Urine Culture     Status: None   Collection Time: 08/13/22  6:31 PM   Specimen: Urine, Clean Catch  Result Value Ref Range   Specimen Description      URINE, CLEAN CATCH Performed at [facility], [address], [place], [place] [address]    Special Requests      NONE Performed at [facility], [address] [place], [place] [address]    Culture      NO GROWTH Performed at [facility], [place] [address] [place],  [address]    Report Status 08/14/2022 FINAL   Comprehensive metabolic panel     Status: Abnormal   Collection Time: 08/23/22  1:11 PM  Result Value Ref Range  Glucose 117 (H) 70 - 99 mg/dL   BUN 20 8 - 27 mg/dL   Creatinine, Ser 0.85 0.57 - 1.00 mg/dL   eGFR 71 >59 mL/min/1.73   BUN/Creatinine Ratio 24 12 - 28   Sodium 146 (H) 134 - 144 mmol/L   Potassium 4.9 3.5 - 5.2 mmol/L   Chloride 106 96 - 106 mmol/L   CO2 25 20 - 29 mmol/L   Calcium  9.7 8.7 - 10.3 mg/dL   Total Protein 6.5 6.0 - 8.5 g/dL   Albumin 4.2 3.8 - 4.8 g/dL   Globulin, Total 2.3 1.5 - 4.5 g/dL   Albumin/Globulin Ratio 1.8 1.2 - 2.2   Bilirubin Total 0.3 0.0 - 1.2 mg/dL   Alkaline Phosphatase 151 (H) 44 - 121 IU/L   AST 21 0 - 40 IU/L   ALT 23 0 - 32 IU/L  D-dimer, quantitative     Status: Abnormal   Collection Time: 08/23/22  1:11 PM  Result Value Ref Range   D-DIMER 5.50 (H) 0.00 - 0.49 mg/L FEU    Comment: According to the assay manufacturer's published package insert, a normal (<0.50 mg/L FEU) D-dimer result in conjunction with a non-high clinical probability assessment, excludes deep vein thrombosis (DVT) and pulmonary embolism (PE) with high sensitivity. D-dimer values increase with age and this can make VTE exclusion of an older population difficult. To address this, the [place], based on best available evidence and recent guidelines, recommends that clinicians use age-adjusted D-dimer thresholds in patients greater than 60 years of age with: a) a low probability of PE who do not meet all Pulmonary Embolism Rule Out Criteria, or b) in those with intermediate probability of PE. The formula for an age-adjusted D-dimer cut-off is "age/100". For example, a 76 year old patient would have an age-adjusted cut-off of 0.60 mg/L FEU and an 77 year old 0.80 mg/L FEU.   Uric acid     Status: None   Collection Time: 08/23/22  1:11 PM  Result Value Ref Range   Uric Acid 5.6 3.1 - 7.9 mg/dL    Comment:            Therapeutic target for gout patients: <6.0  Urinalysis, Complete     Status: Abnormal   Collection Time: 08/29/22  2:25 PM  Result Value Ref Range   Specific Gravity, UA 1.030 1.005 - 1.030   pH, UA 5.0 5.0 - 7.5   Color, UA Yellow Yellow   Appearance Ur Clear Clear   Leukocytes,UA 1+ (A) Negative   Protein,UA Negative Negative/Trace   Glucose, UA Negative Negative   Ketones, UA Negative Negative   RBC, UA Negative  Negative   Bilirubin, UA Negative Negative   Urobilinogen, Ur 0.2 0.2 - 1.0 mg/dL   Nitrite, UA Negative Negative   Microscopic Examination See below:   Microscopic Examination     Status: Abnormal   Collection Time: 08/29/22  2:25 PM   Urine  Result Value Ref Range   WBC, UA 11-30 (A) 0 - 5 /hpf   RBC, Urine 0-2 0 - 2 /hpf   Epithelial Cells (non renal) 0-10 0 - 10 /hpf   Mucus, UA Present (A) Not Estab.   Bacteria, UA Moderate (A) None seen/Few  Bladder Scan (Post Void Residual) in office     Status: None   Collection Time: 08/29/22  3:10 PM  Result Value Ref Range   Scan Result 40m   CBC     Status: None  Collection Time: 09/13/22  3:59 PM  Result Value Ref Range   WBC 7.7 4.0 - 10.5 K/uL   RBC 4.81 3.87 - 5.11 MIL/uL   Hemoglobin 14.0 12.0 - 15.0 g/dL   HCT 43.9 36.0 - 46.0 %   MCV 91.3 80.0 - 100.0 fL   MCH 29.1 26.0 - 34.0 pg   MCHC 31.9 30.0 - 36.0 g/dL   RDW 13.6 11.5 - 15.5 %   Platelets 269 150 - 400 K/uL   nRBC 0.0 0.0 - 0.2 %    Comment: Performed at [facility], [address], [place], [place] [address]  Basic metabolic panel     Status: None   Collection Time: 09/13/22  3:59 PM  Result Value Ref Range   Sodium 139 135 - 145 mmol/L   Potassium 3.9 3.5 - 5.1 mmol/L   Chloride 105 98 - 111 mmol/L   CO2 25 22 - 32 mmol/L   Glucose, Bld 98 70 - 99 mg/dL    Comment: Glucose reference range applies only to samples taken after fasting for at least 8 hours.   BUN 20 8 - 23 mg/dL   Creatinine, Ser 0.88 0.44 - 1.00 mg/dL   Calcium 9.4 8.9 - 10.3 mg/dL   GFR, Estimated >60 >60 mL/min    Comment: (NOTE) Calculated using the CKD-EPI Creatinine Equation (2021)    Anion gap 9 5 - 15    Comment: Performed at [facility], [address] [place], [place] [address]    Radiology VAS [place] LOWER EXTREMITY VENOUS (DVT)  Result Date: 09/14/2022  Lower Venous DVT Study Patient Name:  [facility]  Date of Exam:   09/13/2022 Medical Rec #:  [id number]       Accession #:    [id number] Date of Birth: 1946/05/05       Patient Gender: F Patient Age:   68 years Exam Location:  [facility] Procedure:      VAS [place] LOWER EXTREMITY VENOUS (DVT) Referring Phys: MAX HYATT --------------------------------------------------------------------------------  Indications: Swelling, and right.  Performing Technologist: Concha Norway RVT  Examination Guidelines: A complete evaluation includes B-mode imaging, spectral Doppler, color Doppler, and power Doppler as needed of all accessible portions of each vessel. Bilateral testing is considered an integral part of a complete examination. Limited examinations for reoccurring indications may be performed as noted. The reflux portion of the exam is performed with the patient in reverse Trendelenburg.  +---------+---------------+---------+-----------+---------------+--------------+ RIGHT    CompressibilityPhasicitySpontaneityProperties     Thrombus Aging +---------+---------------+---------+-----------+---------------+--------------+ CFV      Full           Yes      Yes                                      +---------+---------------+---------+-----------+---------------+--------------+ SFJ      Full           Yes      Yes                                      +---------+---------------+---------+-----------+---------------+--------------+ FV Prox  Partial                            partially  re-cannalized                 +---------+---------------+---------+-----------+---------------+--------------+ FV Mid   None                                                             +---------+---------------+---------+-----------+---------------+--------------+ FV DistalNone                                                             +---------+---------------+---------+-----------+---------------+--------------+ PFV       Full           Yes      Yes                                      +---------+---------------+---------+-----------+---------------+--------------+ POP      Partial                            partially                                                                 re-cannalized                 +---------+---------------+---------+-----------+---------------+--------------+ PTV      Partial                            partially                                                                 re-cannalized                 +---------+---------------+---------+-----------+---------------+--------------+ PERO     Full           Yes      Yes                                      +---------+---------------+---------+-----------+---------------+--------------+ Gastroc  None                                                             +---------+---------------+---------+-----------+---------------+--------------+ GSV      Full           Yes      Yes                                      +---------+---------------+---------+-----------+---------------+--------------+  +------+---------------+---------+-----------+----------+--------------+  LEFT  CompressibilityPhasicitySpontaneityPropertiesThrombus Aging +------+---------------+---------+-----------+----------+--------------+ CFV   Full           Yes      Yes                                 +------+---------------+---------+-----------+----------+--------------+ SFJ   Full           Yes      Yes                                 +------+---------------+---------+-----------+----------+--------------+ FV MidFull           Yes      Yes                                 +------+---------------+---------+-----------+----------+--------------+  Findings reported to [facility] the ordering physicians office at 1:57p. The patient was directed to the ER at [facility] office request.  Summary: RIGHT: -  Findings consistent with acute deep vein thrombosis involving the right femoral vein, right popliteal vein, right posterior tibial veins, and right gastrocnemius veins.   *See table(s) above for measurements and observations. Electronically signed by Leotis Pain MD on 09/14/2022 at 8:57:27 AM.    Final     Assessment/Plan  DVT (deep venous thrombosis) ([place]) The patient has an acute right lower extremity DVT which is fairly extensive and causing her a large amount of symptoms.  She has not had significant improvement with anticoagulation alone would be very interested in having thrombectomy or anything that may possibly improve her symptoms.  We discussed the chronicity of the situation and we are on the border of whether or not this would be of benefit, but with the larger devices were seeing benefit out sometimes beyond 3 weeks.  I discussed the risks and benefits of the procedure and the patient would desire to proceed.  She has requested Monday, November 6 and we have scheduled for this date.  HTN (hypertension) blood pressure control important in reducing the progression of atherosclerotic disease. On appropriate oral medications.   Hyperlipidemia lipid control important in reducing the progression of atherosclerotic disease. Continue statin therapy    Leotis Pain, MD  09/25/2022 4:28 PM    This note was created with Dragon medical transcription system.  Any errors from dictation are purely unintentional

## 2022-09-25 NOTE — Assessment & Plan Note (Signed)
lipid control important in reducing the progression of atherosclerotic disease. Continue statin therapy  

## 2022-09-25 NOTE — Assessment & Plan Note (Signed)
blood pressure control important in reducing the progression of atherosclerotic disease. On appropriate oral medications.  

## 2022-09-25 NOTE — Patient Instructions (Signed)
Deep Vein Thrombosis  Deep vein thrombosis (DVT) is a condition in which a blood clot forms in a vein of the deep venous system. This can occur in the lower leg, thigh, pelvis, arm, or neck. A clot is blood that has thickened into a gel or solid. This condition is serious and can be life-threatening if the clot travels to the arteries of the lungs and causes a blockage (pulmonary embolism). A DVT can also damage veins in the leg, which can lead to long-term venous disease, leg pain, swelling, discoloration, and ulcers or sores (post-thrombotic syndrome). What are the causes? This condition may be caused by: A slowdown of blood flow. Damage to a vein. A condition that causes blood to clot more easily, such as certain bleeding disorders. What increases the risk? The following factors may make you more likely to develop this condition: Obesity. Being older, especially older than age 3. Being inactive or not moving around (sedentary lifestyle). This may include: Sitting or lying down for longer than 4-6 hours other than to sleep at night. Being in the hospital, or having major or lengthy surgery. Having any recent bone injuries, such as breaks (fractures), that reduce movement, especially in the lower extremities. Having recent orthopedic surgery on the lower extremities. Being pregnant, giving birth, or having recently given birth. Taking medicines that contain estrogen, such as birth control or hormone replacement therapy. Using products that contain nicotine or tobacco, especially if you use hormonal birth control. Having a history of a blood vessel disease (peripheral vascular disease) or congestive heart disease. Having a history of cancer, especially if being treated with chemotherapy. What are the signs or symptoms? Symptoms of this condition include: Swelling, pain, pressure, or tenderness in an arm or a leg. An arm or a leg becoming warm, red, or discolored. A leg turning very pale or  blue. You may have a large DVT. This is rare. If the clot is in your leg, you may notice that symptoms get worse when you stand or walk. In some cases, there are no symptoms. How is this diagnosed? This condition is diagnosed with: Your medical history and a physical exam. Tests, such as: Blood tests to check how well your blood clots. Doppler ultrasound. This is the best way to find a DVT. CT venogram. Contrast dye is injected into a vein, and X-rays are taken to check for clots. This is helpful for veins in the chest or pelvis. How is this treated? Treatment for this condition depends on: The cause of your DVT. The size and location of your DVT, or having more than one DVT. Your risk for bleeding or developing more clots. Other medical conditions you may have. Treatment may include: Taking a blood thinner medicine (anticoagulant) to prevent more clots from forming or current clots from growing. Wearing compression stockings. Injecting medicines into the affected vein to break up the clot (catheter-directed thrombolysis). Surgical procedures, when DVT is severe or hard to treat. These may be done to: Isolate and remove your clot. Place an inferior vena cava (IVC) filter. This filter is placed into a large vein called the inferior vena cava to catch blood clots before they reach your lungs. You may get some medical treatments for 6 months or longer. Follow these instructions at home: If you are taking blood thinners: Talk with your health care provider before you take any medicines that contain aspirin or NSAIDs, such as ibuprofen. These medicines increase your risk for dangerous bleeding. Take your medicine exactly  as told, at the same time every day. Do not skip a dose. Do not take more than the prescribed dose. This is important. Ask your health care provider about foods and medicines that could change or interact with the way your blood thinner works. Avoid these foods and medicines  if you are told to do so. Avoid anything that may cause bleeding or bruising. You may bleed more easily while taking blood thinners. Be very careful when using knives, scissors, or other sharp objects. Use an electric razor instead of a blade. Avoid activities that could cause injury or bruising, and follow instructions for preventing falls. Tell your health care provider if you have had any internal bleeding, bleeding ulcers, or neurologic diseases, such as strokes or cerebral aneurysms. Wear a medical alert bracelet or carry a card that lists what medicines you take. General instructions Take over-the-counter and prescription medicines only as told by your health care provider. Return to your normal activities as told by your health care provider. Ask your health care provider what activities are safe for you. If recommended, wear compression stockings as told by your health care provider. These stockings help to prevent blood clots and reduce swelling in your legs. Never wear your compression stockings while sleeping at night. Keep all follow-up visits. This is important. Where to find more information American Heart Association: www.heart.org Centers for Disease Control and Prevention: http://www.wolf.info/ National Heart, Lung, and Blood Institute: https://wilson-eaton.com/ Contact a health care provider if: You miss a dose of your blood thinner. You have unusual bruising or other color changes. You have new or worse pain, swelling, or redness in an arm or a leg. You have worsening numbness or tingling in an arm or a leg. You have a significant color change (pale or blue) in the extremity that has the DVT. Get help right away if: You have signs or symptoms that a blood clot has moved to the lungs. These may include: Shortness of breath. Chest pain. Fast or irregular heartbeats (palpitations). Light-headedness, dizziness, or fainting. Coughing up blood. You have signs or symptoms that your blood is  too thin. These may include: Blood in your vomit, stool, or urine. A cut that will not stop bleeding. A menstrual period that is heavier than usual. A severe headache or confusion. These symptoms may be an emergency. Get help right away. Call 911. Do not wait to see if the symptoms will go away. Do not drive yourself to the hospital. Summary Deep vein thrombosis (DVT) happens when a blood clot forms in a deep vein. This may occur in the lower leg, thigh, pelvis, arm, or neck. Symptoms affect the arm or leg and can include swelling, pain, tenderness, warmth, redness, or discoloration. This condition may be treated with medicines. In severe cases, a procedure or surgery may be done to remove or dissolve the clots. If you are taking blood thinners, take them exactly as told. Do not skip a dose. Do not take more than is prescribed. Get help right away if you have a severe headache, shortness of breath, chest pain, fast or irregular heartbeats, or blood in your vomit, urine, or stool. This information is not intended to replace advice given to you by your health care provider. Make sure you discuss any questions you have with your health care provider. Document Revised: 06/05/2021 Document Reviewed: 06/05/2021 Elsevier Patient Education  [place].

## 2022-09-25 NOTE — Assessment & Plan Note (Signed)
The patient has an acute right lower extremity DVT which is fairly extensive and causing her a large amount of symptoms.  She has not had significant improvement with anticoagulation alone would be very interested in having thrombectomy or anything that may possibly improve her symptoms.  We discussed the chronicity of the situation and we are on the border of whether or not this would be of benefit, but with the larger devices were seeing benefit out sometimes beyond 3 weeks.  I discussed the risks and benefits of the procedure and the patient would desire to proceed.  She has requested Monday, November 6 and we have scheduled for this date.

## 2022-09-26 ENCOUNTER — Telehealth (INDEPENDENT_AMBULATORY_CARE_PROVIDER_SITE_OTHER): Payer: Self-pay

## 2022-09-26 NOTE — Telephone Encounter (Signed)
Spoke with the patient and she is scheduled with Dr. Lucky Cowboy for a right leg thrombectomy on 10/01/22 with a 1:00 pm arrival time to the [facility]. Pre-procedure instructions were discussed and will be mailed.

## 2022-09-27 ENCOUNTER — Telehealth: Payer: Self-pay | Admitting: Cardiovascular Disease

## 2022-09-27 NOTE — Telephone Encounter (Signed)
Spoke with patient and she wants to cancel her test we had ordered for her. Inquired what type of testing she was referring to but she was not sure. After further chart review and her last visit they ordered and echocardiogram to be done. Advised that they may still call to attempt to schedule but she can tell them when she is ready to schedule. As of now she is not scheduled and she will call back when ready. She verbalized understanding with no further questions at this time.

## 2022-09-27 NOTE — Telephone Encounter (Signed)
Patient calling in because she is not ready to do any test right now, states she will call our office back when she ready. Pleas advise

## 2022-09-28 ENCOUNTER — Ambulatory Visit (INDEPENDENT_AMBULATORY_CARE_PROVIDER_SITE_OTHER): Payer: Medicare Other | Admitting: Vascular Surgery

## 2022-10-01 ENCOUNTER — Telehealth: Payer: Self-pay | Admitting: Podiatry

## 2022-10-01 ENCOUNTER — Ambulatory Visit
Admission: RE | Admit: 2022-10-01 | Discharge: 2022-10-01 | Disposition: A | Discharge status: Home / Self Care | Payer: Medicare Other | Attending: Vascular Surgery | Admitting: Vascular Surgery

## 2022-10-01 ENCOUNTER — Encounter
Admission: RE | Disposition: A | Discharge status: Home / Self Care | Payer: Self-pay | Source: Home / Self Care | Attending: Vascular Surgery

## 2022-10-01 ENCOUNTER — Other Ambulatory Visit: Payer: Self-pay

## 2022-10-01 ENCOUNTER — Encounter: Payer: Self-pay | Admitting: Vascular Surgery

## 2022-10-01 DIAGNOSIS — E785 Hyperlipidemia, unspecified: Secondary | ICD-10-CM | POA: Insufficient documentation

## 2022-10-01 DIAGNOSIS — I82411 Acute embolism and thrombosis of right femoral vein: Secondary | ICD-10-CM | POA: Insufficient documentation

## 2022-10-01 DIAGNOSIS — I82409 Acute embolism and thrombosis of unspecified deep veins of unspecified lower extremity: Secondary | ICD-10-CM

## 2022-10-01 DIAGNOSIS — I82421 Acute embolism and thrombosis of right iliac vein: Secondary | ICD-10-CM

## 2022-10-01 DIAGNOSIS — I10 Essential (primary) hypertension: Secondary | ICD-10-CM | POA: Diagnosis not present

## 2022-10-01 DIAGNOSIS — I871 Compression of vein: Secondary | ICD-10-CM | POA: Insufficient documentation

## 2022-10-01 HISTORY — PX: PERIPHERAL VASCULAR THROMBECTOMY: CATH118306

## 2022-10-01 LAB — CREATININE, SERUM
Creatinine, Ser: 0.95 mg/dL (ref 0.44–1.00)
GFR, Estimated: >60 mL/min (ref >60)

## 2022-10-01 LAB — BUN: BUN: 16 mg/dL (ref 8–23)

## 2022-10-01 SURGERY — PERIPHERAL VASCULAR THROMBECTOMY
Anesthesia: Moderate Sedation | Laterality: Right

## 2022-10-01 MED ORDER — MIDAZOLAM HCL 2 MG/ML PO SYRP: 8.0000 mg | ORAL_SOLUTION | Freq: Once | ORAL | Status: DC | PRN

## 2022-10-01 MED ORDER — IODIXANOL 320 MG/ML IV SOLN
INTRAVENOUS | Status: DC | PRN
  Administered 2022-10-01: 55 mL via INTRAVENOUS

## 2022-10-01 MED ORDER — SODIUM CHLORIDE 0.9 % IV SOLN: INTRAVENOUS | Status: DC

## 2022-10-01 MED ORDER — FENTANYL CITRATE (PF) 100 MCG/2ML IJ SOLN
INTRAMUSCULAR | Status: DC | PRN
  Administered 2022-10-01: 50 ug via INTRAVENOUS

## 2022-10-01 MED ORDER — HEPARIN SODIUM (PORCINE) 1000 UNIT/ML IJ SOLN
INTRAMUSCULAR | Status: AC
  Filled 2022-10-01: qty 10

## 2022-10-01 MED ORDER — FAMOTIDINE 20 MG PO TABS: 40.0000 mg | ORAL_TABLET | Freq: Once | ORAL | Status: DC | PRN

## 2022-10-01 MED ORDER — ACETAMINOPHEN 500 MG PO TABS
ORAL_TABLET | ORAL | Status: AC
  Filled 2022-10-01: qty 2

## 2022-10-01 MED ORDER — METHYLPREDNISOLONE SODIUM SUCC 125 MG IJ SOLR: 125.0000 mg | Freq: Once | INTRAMUSCULAR | Status: DC | PRN

## 2022-10-01 MED ORDER — DIPHENHYDRAMINE HCL 50 MG/ML IJ SOLN: 50.0000 mg | Freq: Once | INTRAMUSCULAR | Status: DC | PRN

## 2022-10-01 MED ORDER — HEPARIN SODIUM (PORCINE) 1000 UNIT/ML IJ SOLN
INTRAMUSCULAR | Status: DC | PRN
  Administered 2022-10-01: 3000 [IU] via INTRAVENOUS

## 2022-10-01 MED ORDER — ACETAMINOPHEN 500 MG PO TABS
1000.0000 mg | ORAL_TABLET | Freq: Once | ORAL | Status: AC
  Administered 2022-10-01: 1000 mg via ORAL

## 2022-10-01 MED ORDER — CEFAZOLIN SODIUM-DEXTROSE 2-4 GM/100ML-% IV SOLN: 2.0000 g | INTRAVENOUS | Status: AC

## 2022-10-01 MED ORDER — ONDANSETRON HCL 4 MG/2ML IJ SOLN: 4.0000 mg | Freq: Four times a day (QID) | INTRAMUSCULAR | Status: DC | PRN

## 2022-10-01 MED ORDER — FENTANYL CITRATE (PF) 100 MCG/2ML IJ SOLN
INTRAMUSCULAR | Status: AC
  Filled 2022-10-01: qty 2

## 2022-10-01 MED ORDER — HYDROMORPHONE HCL 1 MG/ML IJ SOLN: 1.0000 mg | Freq: Once | INTRAMUSCULAR | Status: DC | PRN

## 2022-10-01 MED ORDER — MIDAZOLAM HCL 2 MG/2ML IJ SOLN
INTRAMUSCULAR | Status: DC | PRN
  Administered 2022-10-01: 2 mg via INTRAVENOUS

## 2022-10-01 MED ORDER — MIDAZOLAM HCL 5 MG/5ML IJ SOLN
INTRAMUSCULAR | Status: AC
  Filled 2022-10-01: qty 5

## 2022-10-01 MED ORDER — CEFAZOLIN SODIUM-DEXTROSE 2-4 GM/100ML-% IV SOLN
INTRAVENOUS | Status: AC
  Administered 2022-10-01: 2 g via INTRAVENOUS
  Filled 2022-10-01: qty 100

## 2022-10-01 SURGICAL SUPPLY — 20 items
BALLN ATG 12X6X80 (BALLOONS) ×1
BALLN ATG 14X6X80 (BALLOONS) ×1
BALLN DORADO 10X80X80 (BALLOONS) ×1
BALLOON ATG 12X6X80 (BALLOONS) IMPLANT
BALLOON ATG 14X6X80 (BALLOONS) IMPLANT
BALLOON DORADO 10X80X80 (BALLOONS) IMPLANT
CANISTER PENUMBRA ENGINE (MISCELLANEOUS) IMPLANT
CANNULA 5F STIFF (CANNULA) IMPLANT
CATH INDIGO 12XTORQ 100 (CATHETERS) IMPLANT
CATH TEMPO 5F RIM 65CM (CATHETERS) IMPLANT
CLOSURE PERCLOSE PROSTYLE (VASCULAR PRODUCTS) IMPLANT
COVER PROBE ULTRASOUND 5X96 (MISCELLANEOUS) IMPLANT
KIT ENCORE 26 ADVANTAGE (KITS) IMPLANT
PACK ANGIOGRAPHY (CUSTOM PROCEDURE TRAY) ×1 IMPLANT
SHEATH BRITE TIP 6FRX11 (SHEATH) IMPLANT
SHEATH PINNACLE 11FRX10 (SHEATH) IMPLANT
STENT VENOVO 18X100X80 (Permanent Stent) IMPLANT
SUT PROLENE 0 CT 1 30 (SUTURE) IMPLANT
WIRE GUIDERIGHT .035X150 (WIRE) IMPLANT
WIRE SUPRACORE 190CM (MISCELLANEOUS) IMPLANT

## 2022-10-01 NOTE — Interval H&P Note (Signed)
History and Physical Interval Note:  10/01/2022 1:04 PM  Carrie Larsen  has presented today for surgery, with the diagnosis of R leg venous thrombectomy   DVT.  The various methods of treatment have been discussed with the patient and family. After consideration of risks, benefits and other options for treatment, the patient has consented to  Procedure(s): PERIPHERAL VASCULAR THROMBECTOMY (Right) as a surgical intervention.  The patient's history has been reviewed, patient examined, no change in status, stable for surgery.  I have reviewed the patient's chart and labs.  Questions were answered to the patient's satisfaction.     Leotis Pain

## 2022-10-01 NOTE — Op Note (Signed)
[facility]   OPERATIVE NOTE   PRE-OPERATIVE DIAGNOSIS: extensive RLE DVT  POST-OPERATIVE DIAGNOSIS: same   PROCEDURE: 1.   US guidance for vascular access to right popliteal vein 2.   Catheter placement into right external iliac vein from right popliteal approach 3.   IVC gram and lower extremity venogram 4.   Mechanical thrombectomy to right superficial femoral vein, common femoral vein, external and common iliac veins with the penumbra CAT 12 catheter 5.   PTA of the right external and common iliac veins with an 12 mm balloon 6.   Stent placement of the right common and external iliac veins with 18 mm diameter by 10 cm length stent    SURGEON: Leotis Pain, MD  ASSISTANT(S): none  ANESTHESIA: local with moderate conscious sedation for 51 minutes using 2 mg of Versed and 50 mcg of Fentanyl  ESTIMATED BLOOD LOSS: 250 cc  FINDING(S): 1.  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 female who presents with extensive right lower extremity DVT.  Patient has marked leg swelling and pain.  Venous intervention is performed to reduce the symtpoms and avoid long term postphlebitic symptoms.    DESCRIPTION: After obtaining full informed written consent, the patient was brought back to the vascular suite and placed supine upon the table. Moderate conscious sedation was administered during a face to face encounter with the patient throughout the procedure with my supervision of the RN administering medicines and monitoring the patient's vital signs, pulse oximetry, telemetry and mental status throughout from the start of the procedure until the patient was taken to the recovery room.  After obtaining adequate anesthesia, the patient was prepped and draped in the standard fashion.     The patient was then placed into the prone position.  The right popliteal vein was then accessed under direct ultrasound guidance without difficulty with a micropuncture needle and a permanent image was recorded.  I then upsized to an 8Fr sheath over a J wire.  A ProGlide device was then placed over the wire and we upsized to an 11 [place] sheath.  3000 units of heparin were then given.  Imaging showed extensive DVT with minimal flow.  A Kumpe catheter and supervision for wire were then advanced into the CFV external iliac vein and images were performed.  The right common femoral vein, external iliac vein, and common iliac veins had significant thrombus and there appeared to be significant narrowing in the right common iliac vein.  I was able to cross the thrombus and stenosis and advance into the IVC which was patent.  I then exchanged for a rim catheter an cannulated the left common iliac vein which was widely patent to help opacify the iliac venous confluence and started the IVC.   I used the Penumbra Cat 12 catheter and evacuated about 250 cc of effluent with mechanical thrombectomy throughout the iliac veins, CFV, SFV.  This had marked improvement.  There is a small amount of residual thrombus in the superficial femoral vein and common femoral vein.  Iliac vein remained tightly narrowed it did not appear any residual thrombus.  I then turned my attention to the iliac veins.  The stenosis/occlusion and thrombus was treated with a 10 mm diameter angioplasty balloon.  There was minimal improvement.  I then upsized  to a 12 mm diameter angioplasty balloon and performed 2 inflations with a 6 cm length balloon in the right common and external iliac veins but there remained greater than 50% residual stenosis.  I then elected to stent the area.  An 18 mm diameter by 10 cm length of the Venovo stent was deployed taking care to stay just below the origin of the IVC and the proximal common iliac vein on the right.  This  came down to the external iliac vein a few centimeters above the femoral head.  This was postdilated with 2 inflations with a 14 mm diameter by 6 cm length balloon with excellent angiographic completion result and less than 10% residual stenosis.  Reviewing also see the origin of the IVC and the right common iliac vein which were protected unharmed and the stent did not impinge these at all.  I then elected to terminate the procedure.  The sheath was removed the Pro-glide device was secured with good hemostatic result.  A few minutes of pressure were held and a dressing was placed.  She was taken to the recovery room in stable condition having tolerated the procedure well.    COMPLICATIONS: None  CONDITION: Stable  Leotis Pain 10/01/2022 4:40 PM

## 2022-10-01 NOTE — Telephone Encounter (Signed)
Patient would like to thank you for your expert care. Pt is having her blood clot surgery today that was diagnosed by you. She really appreciates your expertise and care.

## 2022-10-02 ENCOUNTER — Encounter: Payer: Self-pay | Admitting: Vascular Surgery

## 2022-10-09 ENCOUNTER — Other Ambulatory Visit: Payer: Self-pay | Admitting: Cardiovascular Disease

## 2022-10-09 NOTE — Telephone Encounter (Signed)
Please reschedule F/U appt for 90 day refills. Thank you!

## 2022-10-09 NOTE — Telephone Encounter (Signed)
Also needs echo scheduled prior to appt. Thank you!

## 2022-10-09 NOTE — Telephone Encounter (Signed)
LVM to schedule echo & follow up after testing, please schedule.

## 2022-10-11 ENCOUNTER — Telehealth (INDEPENDENT_AMBULATORY_CARE_PROVIDER_SITE_OTHER): Payer: Self-pay

## 2022-10-11 DIAGNOSIS — M4310 Spondylolisthesis, site unspecified: Secondary | ICD-10-CM | POA: Insufficient documentation

## 2022-10-11 NOTE — Telephone Encounter (Signed)
Pt LVM stating that she had surgery on nov 6th and her leg is swelling bad and painful.  She also states she has been taking ibuprofen q 3-4 hrs with no relief.  The heart dr does not want her using ibuprofen and she needs something for this pain.  Also she wants to know if it is okay to get a flu shot?  Please advise

## 2022-10-11 NOTE — Telephone Encounter (Signed)
Taking her flu shot is absolutely fine.  She can utilize Tylenol.  We can also offer a one-time prescription of tramadol if she would like [place] to send that in.

## 2022-10-12 ENCOUNTER — Other Ambulatory Visit (INDEPENDENT_AMBULATORY_CARE_PROVIDER_SITE_OTHER): Payer: Self-pay | Admitting: Nurse Practitioner

## 2022-10-12 MED ORDER — TRAMADOL HCL 50 MG PO TABS: 50.0000 mg | ORAL_TABLET | Freq: Four times a day (QID) | ORAL | 0 refills | Status: DC | PRN

## 2022-10-12 NOTE — Telephone Encounter (Signed)
Tramadol was sent in.  She certainly may be overdoing it on the steps.  The procedure does not provide instantaneous 100% relief, there is still likely some thrombus present in the patient's body needs to have time to resolve this.  I would recommend that to help with the swelling the patient utilize compression socks.  She elevate her lower extremities when she is not active.  She certainly does not need to utilize steps multiple times a day unless of course it is to get into her home.

## 2022-10-12 NOTE — Telephone Encounter (Signed)
Spoke with pt and she states understanding and will start wearing compression and resting more for a couple of weeks.

## 2022-10-12 NOTE — Telephone Encounter (Signed)
Pt states that her leg is swollen and hard in the center is the normal?  She states she has been going to work and walking 20 stairs 4-5 times a day, do you think she is over doing it or could something be wrong? Please advise

## 2022-10-12 NOTE — Telephone Encounter (Signed)
PT states please send in the Tramadol.

## 2022-10-12 NOTE — Telephone Encounter (Signed)
Called pt with no answer.

## 2022-10-29 ENCOUNTER — Ambulatory Visit (INDEPENDENT_AMBULATORY_CARE_PROVIDER_SITE_OTHER): Payer: Medicare Other | Admitting: Nurse Practitioner

## 2022-10-29 ENCOUNTER — Other Ambulatory Visit (INDEPENDENT_AMBULATORY_CARE_PROVIDER_SITE_OTHER): Payer: Self-pay | Admitting: Vascular Surgery

## 2022-10-29 ENCOUNTER — Ambulatory Visit (INDEPENDENT_AMBULATORY_CARE_PROVIDER_SITE_OTHER): Payer: Medicare Other

## 2022-10-29 DIAGNOSIS — I10 Essential (primary) hypertension: Secondary | ICD-10-CM

## 2022-10-29 DIAGNOSIS — I82421 Acute embolism and thrombosis of right iliac vein: Secondary | ICD-10-CM

## 2022-10-29 DIAGNOSIS — E782 Mixed hyperlipidemia: Secondary | ICD-10-CM | POA: Diagnosis not present

## 2022-10-29 DIAGNOSIS — M109 Gout, unspecified: Secondary | ICD-10-CM | POA: Diagnosis not present

## 2022-10-29 MED ORDER — METHYLPREDNISOLONE 4 MG PO TBPK: ORAL_TABLET | ORAL | 0 refills | Status: DC

## 2022-11-07 ENCOUNTER — Other Ambulatory Visit: Payer: Self-pay | Admitting: Cardiovascular Disease

## 2022-11-07 ENCOUNTER — Telehealth: Payer: Self-pay | Admitting: Cardiovascular Disease

## 2022-11-07 DIAGNOSIS — I10 Essential (primary) hypertension: Secondary | ICD-10-CM

## 2022-11-07 NOTE — Telephone Encounter (Signed)
Please contact pt for future appointment. Pt overdue for 1 month f/u. 

## 2022-11-07 NOTE — Telephone Encounter (Signed)
LVM to schedule fu appt, please schedule 

## 2022-11-08 NOTE — Telephone Encounter (Signed)
Atorvastatin 80 mg # 90 only Isosorbide Mononitrate ER 30 mg # 180 only Patient needs appointment for future refills Sent to  [facility] ([facility] [place] [facility] [place]

## 2022-11-10 ENCOUNTER — Encounter (INDEPENDENT_AMBULATORY_CARE_PROVIDER_SITE_OTHER): Payer: Self-pay | Admitting: Nurse Practitioner

## 2022-11-10 NOTE — Progress Notes (Incomplete)
Subjective:    Patient ID: Carrie Larsen, female    DOB: [birth year], 76 y.o.   MRN: [id number] Chief Complaint  Patient presents with  . Follow-up    Follow up Ultrasound. Patient complains of pain and swelling in her right foot and leg, also warm to the touch. Her groin area is swollen and painful also.     HPI  Review of Systems  Cardiovascular:  Positive for leg swelling.  Musculoskeletal:  Positive for arthralgias and joint swelling.  All other systems reviewed and are negative.      Objective:   Physical Exam Vitals reviewed.  HENT:     Head: Normocephalic.  Cardiovascular:     Rate and Rhythm: Normal rate.     Pulses: Normal pulses.  Pulmonary:     Effort: Pulmonary effort is normal.  Musculoskeletal:     Right lower leg: Edema present.  Skin:    General: Skin is warm and dry.  Neurological:     Mental Status: She is alert and oriented to person, place, and time.  Psychiatric:        Mood and Affect: Mood normal.        Behavior: Behavior normal.        Thought Content: Thought content normal.        Judgment: Judgment normal.     There were no vitals taken for this visit.  Past Medical History:  Diagnosis Date  . Anxiety   . Arthritis    osteoarthritis  . CAD (coronary artery disease)    a. 2012 ETT: no ischemia;  b. 06/2017 NSTEMI/PCI: LM nl, LAD nl, D1 70-80p, LCX nl, OM1 90 (3.0 x 23 Xience Alpine), RCA dominant, 100p/m, fills via L->R collats, EF 55-65%.  . Cancer [facility])    a. s/p partial left nephrectomy.  . Chicken pox   . CME (cystoid macular edema), left 11/20/2018  . Colon cancer ([place]) 1992   T3, N1, M0. colon   . Colon polyp 2011  . COVID-19 07/2019  . Diastolic dysfunction    a. 08/2015 Echo: EF 60-65%, no rwma, Gr1 DD, midlly dil LA, PASP 41mHg. No significant valvular dzs; b. 06/2017 Echo: EF 60-65%, Gr1DD, mildly dil LA.  . Essential hypertension   . GERD (gastroesophageal reflux disease)   . Gout   . History of appendectomy  06/07/2015  . History of kidney cancer 11/20/2018  . History of kidney stones    passed - 2  . Hyperlipidemia   . Lumbar spinal stenosis   . Myocardial infarction ([place]    06/2017  . Nonexudative age-related macular degeneration, bilateral, early dry stage 11/20/2018  . Obesity   . Prediabetes   . Pseudophakia of both eyes   . Retinal cyst     Social History   Socioeconomic History  . Marital status: Single    Spouse name: Not on file  . Number of children: Not on file  . Years of education: Not on file  . Highest education level: Not on file  Occupational History  . 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 Smoking status: Never    Passive exposure: Never  . Smokeless tobacco: Never  Vaping Use  . Vaping Use: Never used  Substance and Sexual Activity  . Alcohol use: No  . Drug use: No  . Sexual activity: Not on file  Other Topics Concern  . Not on file  Social History Narrative  . Not  on file   Social Determinants of Health   Financial Resource Strain: Not on file  Food Insecurity: Not on file  Transportation Needs: Not on file  Physical Activity: Not on file  Stress: Not on file  Social Connections: Not on file  Intimate Partner Violence: Not on file    Past Surgical History:  Procedure Laterality Date  . ABDOMINAL HYSTERECTOMY    . APPENDECTOMY  06/07/2015  . CARDIAC CATHETERIZATION    . CATARACT EXTRACTION W/PHACO Left 04/24/2016   Procedure: CATARACT EXTRACTION PHACO AND INTRAOCULAR LENS PLACEMENT (IOC);  Surgeon: Birder Robson, MD;  Location: [facility] ORS;  Service: Ophthalmology;  Laterality: Left;  [place] 45.8 AP% 16.4 CDE 7.53 FLUID PACK LOT # [id number]  . CATARACT EXTRACTION W/PHACO Right 06/05/2016   Procedure: CATARACT EXTRACTION PHACO AND INTRAOCULAR LENS PLACEMENT (IOC);  Surgeon: Birder Robson, MD;  Location: [facility] ORS;  Service: Ophthalmology;  Laterality: Right;  [place] 25.9 AP% 21.9 CDE 5.68 Fluid pack lot # [id number]   . COLON RESECTION  03/04/1991  . COLON SURGERY    . COLONOSCOPY  03-14-10   Dr Bary Castilla, tubular adenoma at 25 cm.  . COLONOSCOPY WITH PROPOFOL N/A 06/07/2015   Procedure: COLONOSCOPY WITH PROPOFOL;  Surgeon: Robert Bellow, MD;  Location: [facility] ENDOSCOPY;  Service: Endoscopy;  Laterality: N/A;  . COLONOSCOPY WITH PROPOFOL N/A 08/10/2020   Procedure: COLONOSCOPY WITH PROPOFOL;  Surgeon: Robert Bellow, MD;  Location: [facility] ENDOSCOPY;  Service: Endoscopy;  Laterality: N/A;  . CORONARY STENT INTERVENTION N/A 06/28/2017   Procedure: CORONARY STENT INTERVENTION;  Surgeon: Yolonda Kida, MD;  Location: [place] CV LAB;  Service: Cardiovascular;  Laterality: N/A;  . EYE SURGERY     lens eye surgery  . HERNIA REPAIR  [id number]   umbilical  . LEFT HEART CATH AND CORONARY ANGIOGRAPHY N/A 06/28/2017   Procedure: LEFT HEART CATH AND CORONARY ANGIOGRAPHY;  Surgeon: Minna Merritts, MD;  Location: [place] CV LAB;  Service: Cardiovascular;  Laterality: N/A;  . LUMBAR LAMINECTOMY/DECOMPRESSION MICRODISCECTOMY Bilateral 03/10/2018   Procedure: Laminectomy and Foraminotomy - Lumbar three-Lumbar four - Lumbar four-Lumbar five - bilateral;  Surgeon: Earnie Larsson, MD;  Location: [place];  Service: Neurosurgery;  Laterality: Bilateral;  . NEPHRECTOMY  1992   left- cancer  . PERIPHERAL VASCULAR THROMBECTOMY Right 10/01/2022   Procedure: PERIPHERAL VASCULAR THROMBECTOMY;  Surgeon: Algernon Huxley, MD;  Location: [place] CV LAB;  Service: Cardiovascular;  Laterality: Right;  . RIB RESECTION     removal 4 ribs  . TONSILLECTOMY      Family History  Problem Relation Age of Onset  . Colon cancer Mother   . Cerebral aneurysm Father   . Heart attack Father   . Heart attack Brother 73  . Healthy Son     Allergies  Allergen Reactions  . Morphine And Related Nausea Only  . Prednisone Nausea And Vomiting  . Amlodipine Palpitations  . Latex Rash       Latest Ref Rng & Units 09/13/2022    3:59 PM  08/09/2022   10:34 AM 08/27/2021    5:11 AM  CBC  WBC 4.0 - 10.5 K/uL 7.7  9.3  5.8   Hemoglobin 12.0 - 15.0 g/dL 14.0  14.6  15.2   Hematocrit 36.0 - 46.0 % 43.9  43.7  43.6   Platelets 150 - 400 K/uL 269  306  242       CMP     Component Value Date/Time   NA 139  09/13/2022 1559   NA 146 (H) 08/23/2022 1311   NA 141 04/30/2014 2330   K 3.9 09/13/2022 1559   K 4.4 04/30/2014 2330   CL 105 09/13/2022 1559   CL 108 (H) 04/30/2014 2330   CO2 25 09/13/2022 1559   CO2 30 04/30/2014 2330   GLUCOSE 98 09/13/2022 1559   GLUCOSE 92 04/30/2014 2330   BUN 16 10/01/2022 1443   BUN 20 08/23/2022 1311   BUN 14 04/30/2014 2330   CREATININE 0.95 10/01/2022 1443   CREATININE 0.96 08/09/2022 1034   CALCIUM 9.4 09/13/2022 1559   CALCIUM 9.6 04/30/2014 2330   PROT 6.5 08/23/2022 1311   PROT 7.3 08/24/2012 0310   ALBUMIN 4.2 08/23/2022 1311   ALBUMIN 3.8 08/24/2012 0310   AST 21 08/23/2022 1311   AST 32 08/24/2012 0310   ALT 23 08/23/2022 1311   ALT 64 08/24/2012 0310   ALKPHOS 151 (H) 08/23/2022 1311   ALKPHOS 130 08/24/2012 0310   BILITOT 0.3 08/23/2022 1311   BILITOT 0.4 08/24/2012 0310   GFRNONAA >60 10/01/2022 1443   GFRNONAA 54 (L) 04/25/2021 1437   GFRAA 63 04/25/2021 1437     No results found.     Assessment & Plan:   1. 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  The patient is advised to continue with her Eliquis as prescribed.  The patient does have concerns about the affordability of her Eliquis.  We have also given her a application for assistance that she will be able to return and we will fax for the patient to help with facilitation of this.  Her return in 2 weeks - VAS [place] LOWER EXTREMITY VENOUS (DVT)  2. Gout of left foot, unspecified cause, unspecified chronicity I suspect that the patient's  issues are related to a gout flare in her ankle and toe area.  Based on this we will send the patient in prednisone she is instructed to take the medications that she typically takes at home for her gout flares. - methylPREDNISolone (MEDROL DOSEPAK) 4 MG TBPK tablet; Use as directed  Dispense: 21 each; Refill: 0  3. Primary hypertension Continue antihypertensive medications as already ordered, these medications have been reviewed and there are no changes at this time.  4. Mixed hyperlipidemia Continue statin as ordered and reviewed, no changes at this time   Current Outpatient Medications on File Prior to Visit  Medication Sig Dispense Refill  . allopurinol (ZYLOPRIM) 100 MG tablet Take 1 tablet (100 mg total) by mouth daily. 90 tablet 3  . APIXABAN (ELIQUIS) VTE STARTER PACK ('10MG'$  AND '5MG'$ ) Take as directed on package: start with two-'5mg'$  tablets twice daily for 7 days. On day 8, switch to one-'5mg'$  tablet twice daily. 1 each 0  . aspirin EC 81 MG tablet Take 81 mg by mouth daily. Swallow whole.    . carvedilol (COREG) 25 MG tablet Take 1 tablet (25 mg total) by mouth 2 (two) times daily. 180 tablet 1  . colchicine 0.6 MG tablet Take 1 tablet (0.6 mg total) by mouth daily. Take 1 tablet TID until GI upset or flare subsides, then for maintenance, take 1 tablet daily. 90 tablet 3  . esomeprazole (NEXIUM) 40 MG capsule Take 40 mg by mouth daily in the afternoon.    . gabapentin (NEURONTIN) 100 MG capsule Take 100 mg  by mouth 3 (three) times daily as needed.    Marland Kitchen ibuprofen (ADVIL) 100 MG chewable tablet Chew by mouth every 8 (eight) hours as needed. Patient states MD told her she can took three to four advil a day for pain up until today.    . lidocaine (LIDODERM) 5 % Place 1 patch onto the skin daily. Remove & Discard patch within 12 hours or as directed by MD    . losartan-hydrochlorothiazide (HYZAAR) 50-12.5 MG tablet Take 1/2 tablet once a day. 90 tablet 1  . methylPREDNISolone (MEDROL DOSEPAK) 4  MG TBPK tablet 6 day dose pack - take as directed 21 tablet 0  . Multiple Vitamins-Minerals (MULTIVITAMIN WITH MINERALS) tablet Take 1 tablet by mouth daily.    . nitroGLYCERIN (NITROSTAT) 0.4 MG SL tablet Place 1 tablet (0.4 mg total) under the tongue every 5 (five) minutes x 3 doses as needed for chest pain. 25 tablet 1  . ondansetron (ZOFRAN-ODT) 4 MG disintegrating tablet Take 1 tablet (4 mg total) by mouth every 8 (eight) hours as needed for nausea or vomiting. 12 tablet 0  . traMADol (ULTRAM) 50 MG tablet Take 50 mg by mouth daily.     . traMADol (ULTRAM) 50 MG tablet Take 1 tablet (50 mg total) by mouth every 6 (six) hours as needed. 20 tablet 0  . Vibegron (GEMTESA) 75 MG TABS Take 75 mg by mouth daily. 28 tablet 0  . furosemide (LASIX) 20 MG tablet Take 1 tablet (20 mg total) by mouth daily as needed. 30 tablet 1  . potassium chloride (KLOR-CON) 10 MEQ tablet Take 1 tablet (10 mEq total) by mouth daily as needed (when taking furosemide). 30 tablet 1   Current Facility-Administered Medications on File Prior to Visit  Medication Dose Route Frequency Provider Last Rate Last Admin  . betamethasone acetate-betamethasone sodium phosphate (CELESTONE) injection 3 mg  3 mg Intra-articular Once Evans, Brent M, DPM      . dexamethasone (DECADRON) injection 4 mg  4 mg Other Once Criselda Peaches, DPM      . triamcinolone acetonide (KENALOG) 10 MG/ML injection 10 mg  10 mg Other Once Criselda Peaches, DPM        There are no Patient Instructions on file for this visit. No follow-ups on file.   Kris Hartmann, NP

## 2022-11-10 NOTE — Progress Notes (Signed)
Subjective:    Patient ID: [name], female    DOB: 12-Dec-1945, 76 y.o.   MRN: [id number] Chief Complaint  Patient presents with   Follow-up    Follow up Ultrasound. Patient complains of pain and swelling in her right foot and leg, also warm to the touch. Her groin area is swollen and painful also.     Honi Name is a 76 year old female that returns today following intervention on her right lower extremity due to a DVT in the right lower extremity.  She underwent extensive intervention including:  PROCEDURE: 1.   US guidance for vascular access to right popliteal vein 2.   Catheter placement into right external iliac vein from right popliteal approach 3.   IVC gram and lower extremity venogram 4.   Mechanical thrombectomy to right superficial femoral vein, common femoral vein, external and common iliac veins with the penumbra CAT 12 catheter 5.   PTA of the right external and common iliac veins with an 12 mm balloon 6.   Stent placement of the right common and external iliac veins with 18 mm diameter by 10 cm length stent   Today the patient is very tearful noting that she continues to have swelling of the right lower extremity but her biggest issue is extensive pain in her ankle area which makes it difficult to move.  Patient does have a history of gout and the area is warm swollen as is her great toe.  She denies any injury to the area.  Her noninvasive studies today show resolution of the deep vein thrombosis but there is some evidence of superficial phlebitis near the ankle area    Review of Systems  Cardiovascular:  Positive for leg swelling.  Musculoskeletal:  Positive for arthralgias and joint swelling.  All other systems reviewed and are negative.      Objective:   Physical Exam Vitals reviewed.  HENT:     Head: Normocephalic.  Cardiovascular:     Rate and Rhythm: Normal rate.     Pulses: Normal pulses.  Pulmonary:     Effort: Pulmonary effort is normal.   Musculoskeletal:     Right lower leg: Edema present.  Skin:    General: Skin is warm and dry.  Neurological:     Mental Status: She is alert and oriented to person, place, and time.  Psychiatric:        Mood and Affect: Mood normal.        Behavior: Behavior normal.        Thought Content: Thought content normal.        Judgment: Judgment normal.     There were no vitals taken for this visit.  Past Medical History:  Diagnosis Date   Anxiety    Arthritis    osteoarthritis   CAD (coronary artery disease)    a. 2012 ETT: no ischemia;  b. 06/2017 NSTEMI/PCI: LM nl, LAD nl, D1 70-80p, LCX nl, OM1 90 (3.0 x 23 Xience [place]), RCA dominant, 100p/m, fills via L->R collats, EF 55-65%.   Cancer [facility])    a. s/p partial left nephrectomy.   Chicken pox    CME (cystoid macular edema), left 11/20/2018   Colon cancer ([place]) 1992   T3, N1, M0. colon    Colon polyp 2011   COVID-19 03/3975   Diastolic dysfunction    a. 08/2015 Echo: EF 60-65%, no rwma, Gr1 DD, midlly dil LA, PASP 43mHg. No significant valvular dzs; b. 06/2017 Echo: EF 60-65%,  Gr1DD, mildly dil LA.   Essential hypertension    GERD (gastroesophageal reflux disease)    Gout    History of appendectomy 06/07/2015   History of kidney cancer 11/20/2018   History of kidney stones    passed - 2   Hyperlipidemia    Lumbar spinal stenosis    Myocardial infarction ([place])    06/2017   Nonexudative age-related macular degeneration, bilateral, early dry stage 11/20/2018   Obesity    Prediabetes    Pseudophakia of both eyes    Retinal cyst     Social History   Socioeconomic History   Marital status: Single    Spouse name: Not on file   Number of children: Not on file   Years of education: Not on file   Highest education level: Not on file  Occupational History   Occupation: Metallurgist: [employer]  Tobacco Use   Smoking status: Never    Passive exposure: Never   Smokeless tobacco: Never  Vaping Use    Vaping Use: Never used  Substance and Sexual Activity   Alcohol use: No   Drug use: No   Sexual activity: Not on file  Other Topics Concern   Not on file  Social History Narrative   Not on file   Social Determinants of Health   Financial Resource Strain: Not on file  Food Insecurity: Not on file  Transportation Needs: Not on file  Physical Activity: Not on file  Stress: Not on file  Social Connections: Not on file  Intimate Partner Violence: Not on file    Past Surgical History:  Procedure Laterality Date   ABDOMINAL HYSTERECTOMY     APPENDECTOMY  06/07/2015   CARDIAC CATHETERIZATION     CATARACT EXTRACTION W/PHACO Left 04/24/2016   Procedure: CATARACT EXTRACTION PHACO AND INTRAOCULAR LENS PLACEMENT ([place]);  Surgeon: Birder Robson, MD;  Location: [facility] ORS;  Service: Ophthalmology;  Laterality: Left;  [place] 45.8 AP% 16.4 CDE 7.53 FLUID PACK LOT # [id number]   CATARACT EXTRACTION W/PHACO Right 06/05/2016   Procedure: CATARACT EXTRACTION PHACO AND INTRAOCULAR LENS PLACEMENT (IOC);  Surgeon: Birder Robson, MD;  Location: [facility] ORS;  Service: Ophthalmology;  Laterality: Right;  [place] 25.9 AP% 21.9 CDE 5.68 Fluid pack lot # [id number]   COLON RESECTION  03/04/1991   COLON SURGERY     COLONOSCOPY  03-14-10   Dr Bary Castilla, tubular adenoma at 25 cm.   COLONOSCOPY WITH PROPOFOL N/A 06/07/2015   Procedure: COLONOSCOPY WITH PROPOFOL;  Surgeon: Robert Bellow, MD;  Location: [facility];  Service: Endoscopy;  Laterality: N/A;   COLONOSCOPY WITH PROPOFOL N/A 08/10/2020   Procedure: COLONOSCOPY WITH PROPOFOL;  Surgeon: Robert Bellow, MD;  Location: [facility] ENDOSCOPY;  Service: Endoscopy;  Laterality: N/A;   CORONARY STENT INTERVENTION N/A 06/28/2017   Procedure: CORONARY STENT INTERVENTION;  Surgeon: Yolonda Kida, MD;  Location: [place] CV LAB;  Service: Cardiovascular;  Laterality: N/A;   EYE SURGERY     lens eye surgery   HERNIA REPAIR  0272   umbilical   LEFT HEART CATH AND  CORONARY ANGIOGRAPHY N/A 06/28/2017   Procedure: LEFT HEART CATH AND CORONARY ANGIOGRAPHY;  Surgeon: Minna Merritts, MD;  Location: [place] CV LAB;  Service: Cardiovascular;  Laterality: N/A;   LUMBAR LAMINECTOMY/DECOMPRESSION MICRODISCECTOMY Bilateral 03/10/2018   Procedure: Laminectomy and Foraminotomy - Lumbar three-Lumbar four - Lumbar four-Lumbar five - bilateral;  Surgeon: Earnie Larsson, MD;  Location: [place];  Service: Neurosurgery;  Laterality:  Bilateral;   NEPHRECTOMY  1992   left- cancer   PERIPHERAL VASCULAR THROMBECTOMY Right 10/01/2022   Procedure: PERIPHERAL VASCULAR THROMBECTOMY;  Surgeon: Algernon Huxley, MD;  Location: [place] CV LAB;  Service: Cardiovascular;  Laterality: Right;   RIB RESECTION     removal 4 ribs   TONSILLECTOMY      Family History  Problem Relation Age of Onset   Colon cancer Mother    Cerebral aneurysm Father    Heart attack Father    Heart attack Brother 11   Healthy Son     Allergies  Allergen Reactions   Morphine And Related Nausea Only   Prednisone Nausea And Vomiting   Amlodipine Palpitations   Latex Rash       Latest Ref Rng & Units 09/13/2022    3:59 PM 08/09/2022   10:34 AM 08/27/2021    5:11 AM  CBC  WBC 4.0 - 10.5 K/uL 7.7  9.3  5.8   Hemoglobin 12.0 - 15.0 g/dL 14.0  14.6  15.2   Hematocrit 36.0 - 46.0 % 43.9  43.7  43.6   Platelets 150 - 400 K/uL 269  306  242       CMP     Component Value Date/Time   NA 139 09/13/2022 1559   NA 146 (H) 08/23/2022 1311   NA 141 04/30/2014 2330   K 3.9 09/13/2022 1559   K 4.4 04/30/2014 2330   CL 105 09/13/2022 1559   CL 108 (H) 04/30/2014 2330   CO2 25 09/13/2022 1559   CO2 30 04/30/2014 2330   GLUCOSE 98 09/13/2022 1559   GLUCOSE 92 04/30/2014 2330   BUN 16 10/01/2022 1443   BUN 20 08/23/2022 1311   BUN 14 04/30/2014 2330   CREATININE 0.95 10/01/2022 1443   CREATININE 0.96 08/09/2022 1034   CALCIUM 9.4 09/13/2022 1559   CALCIUM 9.6 04/30/2014 2330   PROT 6.5  08/23/2022 1311   PROT 7.3 08/24/2012 0310   ALBUMIN 4.2 08/23/2022 1311   ALBUMIN 3.8 08/24/2012 0310   AST 21 08/23/2022 1311   AST 32 08/24/2012 0310   ALT 23 08/23/2022 1311   ALT 64 08/24/2012 0310   ALKPHOS 151 (H) 08/23/2022 1311   ALKPHOS 130 08/24/2012 0310   BILITOT 0.3 08/23/2022 1311   BILITOT 0.4 08/24/2012 0310   GFRNONAA >60 10/01/2022 1443   GFRNONAA 54 (L) 04/25/2021 1437   GFRAA 63 04/25/2021 1437     No results found.     Assessment & Plan:   1. 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  The patient is advised to continue with her Eliquis as prescribed.  The patient does have concerns about the affordability of her Eliquis.  We have also given her a application for assistance that she will be able to return and we will fax for the patient to help with facilitation of this.  Her return in 2 weeks - VAS [place] LOWER EXTREMITY VENOUS (DVT)  2. Gout of left foot, unspecified cause, unspecified chronicity I suspect that the patient's issues are related to a gout flare in her ankle and toe area.  Based on this we will send the patient in prednisone she is instructed to take the medications that she typically takes at home for her gout flares. - methylPREDNISolone (MEDROL DOSEPAK) 4 MG TBPK  tablet; Use as directed  Dispense: 21 each; Refill: 0  3. Primary hypertension Continue antihypertensive medications as already ordered, these medications have been reviewed and there are no changes at this time.  4. Mixed hyperlipidemia Continue statin as ordered and reviewed, no changes at this time   Current Outpatient Medications on File Prior to Visit  Medication Sig Dispense Refill   allopurinol (ZYLOPRIM) 100 MG tablet Take 1 tablet (100 mg total) by mouth daily. 90 tablet 3   APIXABAN (ELIQUIS) VTE STARTER  PACK ('10MG'$  AND '5MG'$ ) Take as directed on package: start with two-'5mg'$  tablets twice daily for 7 days. On day 8, switch to one-'5mg'$  tablet twice daily. 1 each 0   aspirin EC 81 MG tablet Take 81 mg by mouth daily. Swallow whole.     carvedilol (COREG) 25 MG tablet Take 1 tablet (25 mg total) by mouth 2 (two) times daily. 180 tablet 1   colchicine 0.6 MG tablet Take 1 tablet (0.6 mg total) by mouth daily. Take 1 tablet TID until GI upset or flare subsides, then for maintenance, take 1 tablet daily. 90 tablet 3   esomeprazole (NEXIUM) 40 MG capsule Take 40 mg by mouth daily in the afternoon.     gabapentin (NEURONTIN) 100 MG capsule Take 100 mg by mouth 3 (three) times daily as needed.     ibuprofen (ADVIL) 100 MG chewable tablet Chew by mouth every 8 (eight) hours as needed. Patient states MD told her she can took three to four advil a day for pain up until today.     lidocaine (LIDODERM) 5 % Place 1 patch onto the skin daily. Remove & Discard patch within 12 hours or as directed by MD     losartan-hydrochlorothiazide (HYZAAR) 50-12.5 MG tablet Take 1/2 tablet once a day. 90 tablet 1   methylPREDNISolone (MEDROL DOSEPAK) 4 MG TBPK tablet 6 day dose pack - take as directed 21 tablet 0   Multiple Vitamins-Minerals (MULTIVITAMIN WITH MINERALS) tablet Take 1 tablet by mouth daily.     nitroGLYCERIN (NITROSTAT) 0.4 MG SL tablet Place 1 tablet (0.4 mg total) under the tongue every 5 (five) minutes x 3 doses as needed for chest pain. 25 tablet 1   ondansetron (ZOFRAN-ODT) 4 MG disintegrating tablet Take 1 tablet (4 mg total) by mouth every 8 (eight) hours as needed for nausea or vomiting. 12 tablet 0   traMADol (ULTRAM) 50 MG tablet Take 50 mg by mouth daily.      traMADol (ULTRAM) 50 MG tablet Take 1 tablet (50 mg total) by mouth every 6 (six) hours as needed. 20 tablet 0   Vibegron (GEMTESA) 75 MG TABS Take 75 mg by mouth daily. 28 tablet 0   furosemide (LASIX) 20 MG tablet Take 1 tablet (20 mg total) by mouth  daily as needed. 30 tablet 1   potassium chloride (KLOR-CON) 10 MEQ tablet Take 1 tablet (10 mEq total) by mouth daily as needed (when taking furosemide). 30 tablet 1   Current Facility-Administered Medications on File Prior to Visit  Medication Dose Route Frequency Provider Last Rate Last Admin   betamethasone acetate-betamethasone sodium phosphate (CELESTONE) injection 3 mg  3 mg Intra-articular Once Evans, Brent M, DPM       dexamethasone (DECADRON) injection 4 mg  4 mg Other Once Criselda Peaches, DPM       triamcinolone acetonide (KENALOG) 10 MG/ML injection 10 mg  10 mg Other Once Criselda Peaches, DPM  There are no Patient Instructions on file for this visit. No follow-ups on file.   Kris Hartmann, NP

## 2022-11-13 ENCOUNTER — Ambulatory Visit (INDEPENDENT_AMBULATORY_CARE_PROVIDER_SITE_OTHER): Payer: Medicare Other | Admitting: Nurse Practitioner

## 2022-11-20 ENCOUNTER — Ambulatory Visit: Payer: Medicare Other | Admitting: Cardiovascular Disease

## 2022-11-23 ENCOUNTER — Ambulatory Visit (INDEPENDENT_AMBULATORY_CARE_PROVIDER_SITE_OTHER): Payer: Medicare Other | Admitting: Vascular Surgery

## 2022-11-23 ENCOUNTER — Encounter (INDEPENDENT_AMBULATORY_CARE_PROVIDER_SITE_OTHER): Payer: Self-pay | Admitting: Vascular Surgery

## 2022-11-23 VITALS — BP 150/75 | HR 59 | Ht 68.0 in | Wt 266.0 lb

## 2022-11-23 DIAGNOSIS — I82421 Acute embolism and thrombosis of right iliac vein: Secondary | ICD-10-CM | POA: Diagnosis not present

## 2022-11-23 DIAGNOSIS — I10 Essential (primary) hypertension: Secondary | ICD-10-CM | POA: Diagnosis not present

## 2022-11-23 DIAGNOSIS — E782 Mixed hyperlipidemia: Secondary | ICD-10-CM

## 2022-11-23 DIAGNOSIS — M109 Gout, unspecified: Secondary | ICD-10-CM | POA: Diagnosis not present

## 2022-11-23 NOTE — Progress Notes (Signed)
SUBJECTIVE:  Patient ID: Carrie Larsen, female    DOB: 12-May-1946, 76 y.o.   MRN: [id number] Chief Complaint  Patient presents with   Follow-up    2 week follow up. Patient states she is still in pain and wants to know if it will get any better.    Carrie Larsen is a 76 year old female that returns today following intervention on her right lower extremity due to a DVT in the right lower extremity.  She underwent extensive intervention including:   PROCEDURE: 1.   US guidance for vascular access to right popliteal vein 2.   Catheter placement into right external iliac vein from right popliteal approach 3.   IVC gram and lower extremity venogram 4.   Mechanical thrombectomy to right superficial femoral vein, common femoral vein, external and common iliac veins with the penumbra CAT 12 catheter 5.   PTA of the right external and common iliac veins with an 12 mm balloon 6.   Stent placement of the right common and external iliac veins with 18 mm diameter by 10 cm length stent   Today the patient is feeling much better. She continues to have swelling of the right lower extremity but it is better than 2 weeks ago.Her biggest issue two weeks ago was extensive pain in her ankle area which makes it difficult to move.  Patient does have a history of gout and the area remains warm and swollen as is her great toe.  She denies any injury to the area. Patient states she is going to her primary care provider next Tuesday for treatment. Her noninvasive studies from 2 weeks ago show resolution of the deep vein thrombosis and the superficial phlebitis near the ankle area is much better almost resolved.     [name] is a 76 y.o. female   Past Medical History:  Diagnosis Date   Anxiety    Arthritis    osteoarthritis   CAD (coronary artery disease)    a. 2012 ETT: no ischemia;  b. 06/2017 NSTEMI/PCI: LM nl, LAD nl, D1 70-80p, LCX nl, OM1 90 (3.0 x 23 Xience Alpine), RCA dominant, 100p/m, fills via  L->R collats, EF 55-65%.   Cancer [facility])    a. s/p partial left nephrectomy.   Chicken pox    CME (cystoid macular edema), left 11/20/2018   Colon cancer ([place]) 1992   T3, N1, M0. colon    Colon polyp 2011   COVID-19 30/0923   Diastolic dysfunction    a. 08/2015 Echo: EF 60-65%, no rwma, Gr1 DD, midlly dil LA, PASP 20mHg. No significant valvular dzs; b. 06/2017 Echo: EF 60-65%, Gr1DD, mildly dil LA.   Essential hypertension    GERD (gastroesophageal reflux disease)    Gout    History of appendectomy 06/07/2015   History of kidney cancer 11/20/2018   History of kidney stones    passed - 2   Hyperlipidemia    Lumbar spinal stenosis    [place]    06[place]   Nonexudative age-related macular degeneration, bilateral, early dry stage 11/20/2018   Obesity    Prediabetes    Pseudophakia of both eyes    Retinal cyst     Past Surgical History:  Procedure Laterality Date   ABDOMINAL HYSTERECTOMY     APPENDECTOMY  06/07/2015   CARDIAC CATHETERIZATION     CATARACT EXTRACTION W/PHACO Left 04/24/2016   Procedure: CATARACT EXTRACTION PHACO AND INTRAOCULAR LENS PLACEMENT ([place];  Surgeon: WBirder Robson MD;  Location:  [facility] ORS;  Service: Ophthalmology;  Laterality: Left;  [place] 45.8 AP% 16.4 CDE 7.53 FLUID PACK LOT # [id number]   CATARACT EXTRACTION W/PHACO Right 06/05/2016   Procedure: CATARACT EXTRACTION PHACO AND INTRAOCULAR LENS PLACEMENT (IOC);  Surgeon: Birder Robson, MD;  Location: [facility] ORS;  Service: Ophthalmology;  Laterality: Right;  [place] 25.9 AP% 21.9 CDE 5.68 Fluid pack lot # [id number]   COLON RESECTION  03/04/1991   COLON SURGERY     COLONOSCOPY  03-14-10   Dr Bary Castilla, tubular adenoma at 25 cm.   COLONOSCOPY WITH PROPOFOL N/A 06/07/2015   Procedure: COLONOSCOPY WITH PROPOFOL;  Surgeon: Robert Bellow, MD;  Location: [facility] ENDOSCOPY;  Service: Endoscopy;  Laterality: N/A;   COLONOSCOPY WITH PROPOFOL N/A 08/10/2020   Procedure: COLONOSCOPY WITH PROPOFOL;   Surgeon: Robert Bellow, MD;  Location: [facility] ENDOSCOPY;  Service: Endoscopy;  Laterality: N/A;   CORONARY STENT INTERVENTION N/A 06/28/2017   Procedure: CORONARY STENT INTERVENTION;  Surgeon: Yolonda Kida, MD;  Location: [place] CV LAB;  Service: Cardiovascular;  Laterality: N/A;   EYE SURGERY     lens eye surgery   HERNIA REPAIR  [id number]   umbilical   LEFT HEART CATH AND CORONARY ANGIOGRAPHY N/A 06/28/2017   Procedure: LEFT HEART CATH AND CORONARY ANGIOGRAPHY;  Surgeon: Minna Merritts, MD;  Location: [place] CV LAB;  Service: Cardiovascular;  Laterality: N/A;   LUMBAR LAMINECTOMY/DECOMPRESSION MICRODISCECTOMY Bilateral 03/10/2018   Procedure: Laminectomy and Foraminotomy - Lumbar three-Lumbar four - Lumbar four-Lumbar five - bilateral;  Surgeon: Earnie Larsson, MD;  Location: [place];  Service: Neurosurgery;  Laterality: Bilateral;   NEPHRECTOMY  1992   left- cancer   PERIPHERAL VASCULAR THROMBECTOMY Right 10/01/2022   Procedure: PERIPHERAL VASCULAR THROMBECTOMY;  Surgeon: Algernon Huxley, MD;  Location: [place] CV LAB;  Service: Cardiovascular;  Laterality: Right;   RIB RESECTION     removal 4 ribs   TONSILLECTOMY      Social History   Socioeconomic History   Marital status: Single    Spouse name: Not on file   Number of children: Not on file   Years of education: Not on file   Highest education level: Not on file  Occupational History   Occupation: Metallurgist: [employer]  Tobacco Use   Smoking status: Never    Passive exposure: Never   Smokeless tobacco: Never  Vaping Use   Vaping Use: Never used  Substance and Sexual Activity   Alcohol use: No   Drug use: No   Sexual activity: Not on file  Other Topics Concern   Not on file  Social History Narrative   Not on file   Social Determinants of Health   Financial Resource Strain: Not on file  Food Insecurity: Not on file  Transportation Needs: Not on file  Physical Activity: Not on file   Stress: Not on file  Social Connections: Not on file  Intimate Partner Violence: Not on file    Family History  Problem Relation Age of Onset   Colon cancer Mother    Cerebral aneurysm Father    Heart attack Father    Heart attack Brother 77   Healthy Son     Allergies  Allergen Reactions   Morphine And Related Nausea Only   Prednisone Nausea And Vomiting   Amlodipine Palpitations   Latex Rash     Review of Systems   Review of Systems: Negative Unless Checked Constitutional: '[]'$ Weight loss  '[]'$ Fever  '[]'$ Chills  Cardiac: '[]'$ Chest pain   '[]'$  Atrial Fibrillation  '[]'$ Palpitations   '[]'$ Shortness of breath when laying flat   '[]'$ Shortness of breath with exertion. '[]'$ Shortness of breath at rest Vascular:  '[]'$ Pain in legs with walking   '[]'$ Pain in legs with standing '[]'$ Pain in legs when laying flat   '[]'$ Claudication    '[]'$ Pain in feet when laying flat    '[x]'$ History of DVT   '[x]'$ Phlebitis   '[x]'$ Swelling in legs   '[]'$ Varicose veins   '[]'$ Non-healing ulcers Pulmonary:   '[]'$ Uses home oxygen   '[]'$ Productive cough   '[]'$ Hemoptysis   '[]'$ Wheeze  '[]'$ COPD   '[]'$ Asthma Neurologic:  '[]'$ Dizziness   '[]'$ Seizures  '[]'$ Blackouts '[]'$ History of stroke   '[]'$ History of TIA  '[]'$ Aphasia   '[]'$ Temporary Blindness   '[]'$ Weakness or numbness in arm   '[]'$ Weakness or numbness in leg Musculoskeletal:   '[]'$ Joint swelling   '[]'$ Joint pain   '[]'$ Low back pain  '[]'$  History of Knee Replacement '[]'$ Arthritis '[]'$ back Surgeries  '[]'$  Spinal Stenosis    Hematologic:  '[]'$ Easy bruising  '[]'$ Easy bleeding   '[]'$ Hypercoagulable state   '[]'$ Anemic Gastrointestinal:  '[]'$ Diarrhea   '[]'$ Vomiting  '[]'$ Gastroesophageal reflux/heartburn   '[]'$ Difficulty swallowing. '[]'$ Abdominal pain Genitourinary:  '[]'$ Chronic kidney disease   '[]'$ Difficult urination  '[]'$ Anuric   '[]'$ Blood in urine '[]'$ Frequent urination  '[]'$ Burning with urination   '[]'$ Hematuria Skin:  '[]'$ Rashes   '[]'$ Ulcers '[]'$ Wounds Psychological:  '[]'$ History of anxiety   '[]'$  History of major depression  '[]'$  Memory Difficulties      OBJECTIVE:   Physical  Exam Vitals reviewed.  Constitutional:      Appearance: Normal appearance.  Cardiovascular:     Rate and Rhythm: Normal rate and regular rhythm.     Pulses: Normal pulses.  Pulmonary:     Effort: Pulmonary effort is normal.  Abdominal:     General: Abdomen is flat. Bowel sounds are normal.     Palpations: Abdomen is soft.  Musculoskeletal:        General: Swelling and tenderness present.     Right lower leg: Edema present.  Skin:    General: Skin is warm and dry.     Capillary Refill: Capillary refill takes 2 to 3 seconds.  Neurological:     General: No focal deficit present.     Mental Status: She is alert.  Psychiatric:        Mood and Affect: Mood normal.        Behavior: Behavior normal.        Thought Content: Thought content normal.        Judgment: Judgment normal.     BP (!) 150/75 (BP Location: Right Arm, Patient Position: Sitting, Cuff Size: Large)   Pulse (!) 59   Ht '5\' 8"'$  (1.727 m)   Wt 266 lb (120.7 kg)   BMI 40.45 kg/m   Gen: WD/WN, NAD Head: [place]/AT, No temporalis wasting.  Ear/Nose/Throat: Hearing grossly intact, nares w/o erythema or drainage Eyes: PER, EOMI, sclera nonicteric.  Neck: Supple, no masses.  No JVD.  Pulmonary:  Good air movement, no use of accessory muscles.  Cardiac: RRR Vascular:  Vessel Right Left  Radial Palpable Palpable  Brachial    Femoral    Popliteal    Dorsalis Pedis Not Palpable Palpable  Posterior Tibial Not Palpable Palpable   Gastrointestinal: soft, non-distended. No guarding/no peritoneal signs.  Musculoskeletal: M/S 5/5 throughout.  No deformity or atrophy.  Neurologic: Pain and light touch intact in extremities.  Symmetrical.  Speech is fluent. Motor exam as listed above. Psychiatric: Judgment  intact, Mood & affect appropriate for pt's clinical situation. Dermatologic: No Venous rashes. No Ulcers Noted.  No changes consistent with cellulitis. Lymph : No Cervical lymphadenopathy, no lichenification or skin changes  of chronic lymphedema.       ASSESSMENT AND PLAN:   1. 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 The patient is advised to continue with her Eliquis as prescribed.  Follow up in 3 month with Ultrasound of right lower extremity.- VAS [place] LOWER EXTREMITY VENOUS (DVT)   2. Gout of left foot, unspecified cause, unspecified chronicity Patient's issues are related to a gout flare in her ankle and toe area.  Patient has finished the medrol dose pack and states she continues to have some stiffness and swelling of her ankle limiting her ability to flex and extend her ankle. She will be seeing her PCP next Tuesday for therapy related to her gout.    3. Primary hypertension Continue antihypertensive medications as already ordered, these medications have been reviewed and there are no changes at this time.   4. Mixed hyperlipidemia Continue statin as ordered and reviewed, no changes at this time    Current Outpatient Medications on File Prior to Visit  Medication Sig Dispense Refill   allopurinol (ZYLOPRIM) 100 MG tablet Take 1 tablet (100 mg total) by mouth daily. 90 tablet 3   APIXABAN (ELIQUIS) VTE STARTER PACK ('10MG'$  AND '5MG'$ ) Take as directed on package: start with two-'5mg'$  tablets twice daily for 7 days. On day 8, switch to one-'5mg'$  tablet twice daily. 1 each 0   aspirin EC 81 MG tablet Take 81 mg by mouth daily. Swallow whole.     atorvastatin (LIPITOR) 80 MG tablet Take 1 tablet by mouth once daily 90 tablet 0   carvedilol (COREG) 25 MG tablet Take 1 tablet (25 mg total) by mouth 2 (two) times daily. 180 tablet 1   colchicine 0.6 MG tablet Take 1 tablet (0.6 mg total) by mouth daily. Take 1 tablet TID until GI upset or flare subsides, then for maintenance, take 1 tablet daily. 90 tablet 3   esomeprazole (NEXIUM) 40 MG capsule Take 40 mg by mouth daily in the  afternoon.     gabapentin (NEURONTIN) 100 MG capsule Take 100 mg by mouth 3 (three) times daily as needed.     ibuprofen (ADVIL) 100 MG chewable tablet Chew by mouth every 8 (eight) hours as needed. Patient states MD told her she can took three to four advil a day for pain up until today.     isosorbide mononitrate (IMDUR) 30 MG 24 hr tablet TAKE 1 TABLET BY MOUTH IN THE MORNING AND AT BEDTIME 180 tablet 0   lidocaine (LIDODERM) 5 % Place 1 patch onto the skin daily. Remove & Discard patch within 12 hours or as directed by MD     losartan-hydrochlorothiazide (HYZAAR) 50-12.5 MG tablet Take 1/2 tablet once a day. 90 tablet 1   methylPREDNISolone (MEDROL DOSEPAK) 4 MG TBPK tablet 6 day dose pack - take as directed 21 tablet 0   methylPREDNISolone (MEDROL DOSEPAK) 4 MG TBPK tablet Use as directed 21 each 0   Multiple Vitamins-Minerals (MULTIVITAMIN WITH MINERALS) tablet Take 1 tablet by mouth daily.     nitroGLYCERIN (NITROSTAT) 0.4 MG SL tablet Place 1 tablet (0.4 mg total) under the tongue every 5 (five) minutes x 3 doses as  needed for chest pain. 25 tablet 1   ondansetron (ZOFRAN-ODT) 4 MG disintegrating tablet Take 1 tablet (4 mg total) by mouth every 8 (eight) hours as needed for nausea or vomiting. 12 tablet 0   traMADol (ULTRAM) 50 MG tablet Take 50 mg by mouth daily.      traMADol (ULTRAM) 50 MG tablet Take 1 tablet (50 mg total) by mouth every 6 (six) hours as needed. 20 tablet 0   Vibegron (GEMTESA) 75 MG TABS Take 75 mg by mouth daily. 28 tablet 0   furosemide (LASIX) 20 MG tablet Take 1 tablet (20 mg total) by mouth daily as needed. 30 tablet 1   potassium chloride (KLOR-CON) 10 MEQ tablet Take 1 tablet (10 mEq total) by mouth daily as needed (when taking furosemide). 30 tablet 1   Current Facility-Administered Medications on File Prior to Visit  Medication Dose Route Frequency Provider Last Rate Last Admin   betamethasone acetate-betamethasone sodium phosphate (CELESTONE) injection 3 mg   3 mg Intra-articular Once Evans, Brent M, DPM       dexamethasone (DECADRON) injection 4 mg  4 mg Other Once Criselda Peaches, DPM       triamcinolone acetonide (KENALOG) 10 MG/ML injection 10 mg  10 mg Other Once Criselda Peaches, DPM        There are no Patient Instructions on file for this visit. No follow-ups on file.   Drema Pry, NP  This note was completed with Sales executive.  Any errors are purely unintentional.

## 2022-11-27 ENCOUNTER — Telehealth (INDEPENDENT_AMBULATORY_CARE_PROVIDER_SITE_OTHER): Payer: Self-pay

## 2022-11-27 ENCOUNTER — Ambulatory Visit (INDEPENDENT_AMBULATORY_CARE_PROVIDER_SITE_OTHER): Payer: Medicare Other | Admitting: Podiatry

## 2022-11-27 DIAGNOSIS — M7752 Other enthesopathy of left foot: Secondary | ICD-10-CM

## 2022-11-27 DIAGNOSIS — M7751 Other enthesopathy of right foot: Secondary | ICD-10-CM

## 2022-11-27 MED ORDER — OXYCODONE-ACETAMINOPHEN 5-325 MG PO TABS: 1.0000 | ORAL_TABLET | ORAL | 0 refills | Status: DC | PRN

## 2022-11-27 NOTE — Progress Notes (Signed)
HPI: 77 y.o. female presenting today for an acute gout flareup.  Patient states that she is having gout to the right big toe joint red hot swollen joint.  As well as bilateral ankle.  Mild pain on palpation she would like an injection. Past Medical History:  Diagnosis Date   Anxiety    Arthritis    osteoarthritis   CAD (coronary artery disease)    a. 2012 ETT: no ischemia;  b. 06/2017 NSTEMI/PCI: LM nl, LAD nl, D1 70-80p, LCX nl, OM1 90 (3.0 x 23 Xience Alpine), RCA dominant, 100p/m, fills via L->R collats, EF 55-65%.   Cancer [facility])    a. s/p partial left nephrectomy.   Chicken pox    CME (cystoid macular edema), left 11/20/2018   Colon cancer ([place]) 1992   T3, N1, M0. colon    Colon polyp 2011   COVID-19 98/3382   Diastolic dysfunction    a. 08/2015 Echo: EF 60-65%, no rwma, Gr1 DD, midlly dil LA, PASP 52mHg. No significant valvular dzs; b. 06/2017 Echo: EF 60-65%, Gr1DD, mildly dil LA.   Essential hypertension    GERD (gastroesophageal reflux disease)    Gout    History of appendectomy 06/07/2015   History of kidney cancer 11/20/2018   History of kidney stones    passed - 2   Hyperlipidemia    Lumbar spinal stenosis    [place]    06[place]   Nonexudative age-related macular degeneration, bilateral, early dry stage 11/20/2018   Obesity    Prediabetes    Pseudophakia of both eyes    Retinal cyst     Past Surgical History:  Procedure Laterality Date   ABDOMINAL HYSTERECTOMY     APPENDECTOMY  06/07/2015   CARDIAC CATHETERIZATION     CATARACT EXTRACTION W/PHACO Left 04/24/2016   Procedure: CATARACT EXTRACTION PHACO AND INTRAOCULAR LENS PLACEMENT ([place];  Surgeon: WBirder Robson MD;  Location: [facility] ORS;  Service: Ophthalmology;  Laterality: Left;  [place]45.8 AP% 16.4 CDE 7.53 FLUID PACK LOT # [id number]   CATARACT EXTRACTION W/PHACO Right 06/05/2016   Procedure: CATARACT EXTRACTION PHACO AND INTRAOCULAR LENS PLACEMENT (IOC);  Surgeon: WBirder Robson MD;   Location: [facility] ORS;  Service: Ophthalmology;  Laterality: Right;  [place]25[place] AP% 21.9 CDE 5.68 Fluid pack lot # [id number]   COLON RESECTION  03/04/1991   COLON SURGERY     COLONOSCOPY  03-14-10   Dr BBary Castilla tubular adenoma at 25 cm.   COLONOSCOPY WITH PROPOFOL N/A 06/07/2015   Procedure: COLONOSCOPY WITH PROPOFOL;  Surgeon: JRobert Bellow MD;  [facility]  Service: Endoscopy;  Laterality: N/A;   COLONOSCOPY WITH PROPOFOL N/A 08/10/2020   Procedure: COLONOSCOPY WITH PROPOFOL;  Surgeon: BRobert Bellow MD;  Location: [facility] ENDOSCOPY;  Service: Endoscopy;  Laterality: N/A;   CORONARY STENT INTERVENTION N/A 06/28/2017   Procedure: CORONARY STENT INTERVENTION;  Surgeon: CYolonda Kida MD;  [place];  Service: Cardiovascular;  Laterality: N/A;   EYE SURGERY     lens eye surgery   HERNIA REPAIR  [id number]  umbilical   LEFT HEART CATH AND CORONARY ANGIOGRAPHY N/A 06/28/2017   Procedure: LEFT HEART CATH AND CORONARY ANGIOGRAPHY;  Surgeon: GMinna Merritts MD;  [place] [facility];  Service: Cardiovascular;  Laterality: N/A;   LUMBAR LAMINECTOMY/DECOMPRESSION MICRODISCECTOMY Bilateral 03/10/2018   Procedure: Laminectomy and Foraminotomy - Lumbar three-Lumbar four - Lumbar four-Lumbar five - bilateral;  Surgeon: PEarnie Larsson MD;  [place]  Service: Neurosurgery;  Laterality: Bilateral;   NEPHRECTOMY  1992   left- cancer   PERIPHERAL VASCULAR THROMBECTOMY Right 10/01/2022   Procedure: PERIPHERAL VASCULAR THROMBECTOMY;  Surgeon: Algernon Huxley, MD;  Location: [place] CV LAB;  Service: Cardiovascular;  Laterality: Right;   RIB RESECTION     removal 4 ribs   TONSILLECTOMY      Allergies  Allergen Reactions   Morphine And Related Nausea Only   Prednisone Nausea And Vomiting   Amlodipine Palpitations   Latex Rash     Physical Exam: General: The patient is alert and oriented x3 in no acute distress.  Dermatology: Skin is warm, dry and  supple bilateral lower extremities. Negative for open lesions or macerations.  Vascular: Palpable pedal pulses bilaterally. Capillary refill within normal limits.  Erythema with edema noted to the first MTP joint right side  Neurological: Light touch and protective threshold grossly intact  Musculoskeletal Exam: No pedal deformities noted.  Significant tenderness to light touch and palpation of the first MTP joint bilateral pain on palpation bilateral ankle medial border/gutter.  Pain with range of motion of the ankle joint stiffness noted.  No crepitus noted Assessment: 1.  Acute inflammatory gout/capsulitis first MTP right 2. Bilateral ankle capsulitis   Plan of Care:  1. Patient evaluated. X-Rays reviewed.  2.  A steroid injection was performed at right first MTP and bilateral ankle using 1% plain Lidocaine and 10 mg of Kenalog. This was well tolerated. 3.  Continue colchicine and allopurinol as needed 4.  I will see her back after comes back. Percocet was sent to the pharmacy

## 2022-11-27 NOTE — Telephone Encounter (Signed)
Patient left a message requesting for refill Eliquis to called to [facility]. Eliquis '5mg'$  #60 with 5 refills has been called into pharmacy and patient was made aware.

## 2022-11-29 ENCOUNTER — Telehealth (INDEPENDENT_AMBULATORY_CARE_PROVIDER_SITE_OTHER): Payer: Self-pay

## 2022-11-29 NOTE — Telephone Encounter (Signed)
We gave the patient the paper work at a previous visit for medication assistance.  Does she still have it? Did she fill it out? We can try xarelto, but there likely would be a similar issue

## 2022-11-30 NOTE — Telephone Encounter (Signed)
Patient will be mailing paper work back to our office and she informed that she will be able to get a 30 day supply of Eliquis.

## 2022-12-04 ENCOUNTER — Telehealth (INDEPENDENT_AMBULATORY_CARE_PROVIDER_SITE_OTHER): Payer: Self-pay

## 2022-12-04 NOTE — Telephone Encounter (Signed)
She can hold the ASA while on eliquis

## 2022-12-04 NOTE — Telephone Encounter (Signed)
Left a detailed message on patient voicemail with medical advice

## 2022-12-18 ENCOUNTER — Ambulatory Visit (INDEPENDENT_AMBULATORY_CARE_PROVIDER_SITE_OTHER): Payer: Medicare Other

## 2022-12-18 ENCOUNTER — Ambulatory Visit: Payer: Medicare Other | Attending: Medical | Admitting: Medical

## 2022-12-18 ENCOUNTER — Encounter: Payer: Self-pay | Admitting: Medical

## 2022-12-18 VITALS — BP 130/80 | HR 68 | Ht 70.0 in | Wt 266.0 lb

## 2022-12-18 DIAGNOSIS — E782 Mixed hyperlipidemia: Secondary | ICD-10-CM | POA: Diagnosis present

## 2022-12-18 DIAGNOSIS — I82409 Acute embolism and thrombosis of unspecified deep veins of unspecified lower extremity: Secondary | ICD-10-CM | POA: Diagnosis not present

## 2022-12-18 DIAGNOSIS — I2510 Atherosclerotic heart disease of native coronary artery without angina pectoris: Secondary | ICD-10-CM

## 2022-12-18 DIAGNOSIS — M79661 Pain in right lower leg: Secondary | ICD-10-CM | POA: Diagnosis present

## 2022-12-18 DIAGNOSIS — M7989 Other specified soft tissue disorders: Secondary | ICD-10-CM

## 2022-12-18 DIAGNOSIS — I10 Essential (primary) hypertension: Secondary | ICD-10-CM

## 2022-12-18 NOTE — Progress Notes (Signed)
Cardiology Office Note:    Date:  12/18/2022   ID:  Carrie Larsen [place], [place] 31-Dec-1945, MRN [id number]  PCP:  [name], NP  [facility] Cardiologist:  Ida Rogue, MD  [facility] Electrophysiologist:  None   Referring MD: [name], NP   Chief Complaint: 64-Month Follow-Up  History of Present Illness:    Carrie Larsen is a 77 y.o. female with a hx of  CAD PCI to OM 2018, HLD, colon cancer, s/p Left nephrectomy who presents for follow-up.    H/o CAD with NSTEMI in 06/2017. Treated with PCI to OM in 06/2017 with occluded RCA with collaterals from L>R. Echo at that time showed LVEF 60-65%, no WMA, 1DD.   Patient was last seen September 2023 reporting a mechanical fall.  Since that time she has been using a walker.  Patient reported cough, chest tightness, orthopnea, and lower leg edema.  She called the office and was instructed to take Lasix 40 mg once.  Reported improved symptoms.  PCP had recently started losartan/HCTZ.  Blood pressure was mildly elevated and Coreg was increased to 25 mg twice daily.  Echo was ordered.  Patient was diagnosed with a right sided DVT in 08/2022 in the ER and started on Eliquis and told to follow-up with VS. She saw vascular and underwent extensive intervention by vascular surgery with mechanical thrombectomy to the right superficial femoral vein, common femoral vein and external and common iliac veins, PTA right external and common iliac veins, stent placement of the right common and external iliac veins. She saw VS on 11/23/22 and still had some swelling and tenderness.   Today, the patient reports clot in her right leg that is new since last night. She wants to see vein and vasuclar, however I convinced patient it's best we check a DVT study STAT. She also reports headache. She denies shortness of breath of chest pain. She is taking Eliquis. Not on Aspirin. EKG today shows NSR, 68bpm, nonspecific ST/T wave changes.   Past Medical History:   Diagnosis Date   Anxiety    Arthritis    osteoarthritis   CAD (coronary artery disease)    a. 2012 ETT: no ischemia;  b. 06/2017 NSTEMI/PCI: LM nl, LAD nl, D1 70-80p, LCX nl, OM1 90 (3.0 x 23 Xience [place]pine), RCA dominant, 100p/m, fills via L->R collats, EF 55-65%.   Cancer [facility])    a. s/p partial left nephrectomy.   Chicken pox    CME (cystoid macular edema), left 11/20/2018   Colon cancer ([place]) 1992   T3, N1, M0. colon    Colon polyp 2011   COVID-19 25/4982   Diastolic dysfunction    a. 08/2015 Echo: EF 60-65%, no rwma, Gr1 DD, midlly dil LA, PASP 39mHg. No significant valvular dzs; b. 06/2017 Echo: EF 60-65%, Gr1DD, mildly dil LA.   Essential hypertension    GERD (gastroesophageal reflux disease)    Gout    History of appendectomy 06/07/2015   History of kidney cancer 11/20/2018   History of kidney stones    passed - 2   Hyperlipidemia    Lumbar spinal stenosis    [place]   Nonexudative age-related macular degeneration, bilateral, early dry stage 11/20/2018   Obesity    Prediabetes    Pseudophakia of both eyes    Retinal cyst     Past Surgical History:  Procedure Laterality Date   ABDOMINAL HYSTERECTOMY     APPENDECTOMY  06/07/2015  CARDIAC CATHETERIZATION     CATARACT EXTRACTION W/PHACO Left 04/24/2016   Procedure: CATARACT EXTRACTION PHACO AND INTRAOCULAR LENS PLACEMENT ([place]);  Surgeon: Birder Robson, MD;  Location: [facility] ORS;  Service: Ophthalmology;  Laterality: Left;  [place] 45.8 AP% 16.4 CDE 7.53 FLUID PACK LOT # [id number]   CATARACT EXTRACTION W/PHACO Right 06/05/2016   Procedure: CATARACT EXTRACTION PHACO AND INTRAOCULAR LENS PLACEMENT ([facility]);  Surgeon: Birder Robson, MD;  Location: [facility] ORS;  Service: Ophthalmology;  Laterality: Right;  [place] 25.9 AP% 21.9 CDE 5.68 Fluid pack lot # [id number]   COLON RESECTION  03/04/1991   COLON SURGERY     COLONOSCOPY  03-14-10   Dr Bary Castilla, tubular adenoma at 25 cm.   COLONOSCOPY WITH PROPOFOL  N/A 06/07/2015   Procedure: COLONOSCOPY WITH PROPOFOL;  Surgeon: Robert Bellow, MD;  Location: [facility] ENDOSCOPY;  Service: Endoscopy;  Laterality: N/A;   COLONOSCOPY WITH PROPOFOL N/A 08/10/2020   Procedure: COLONOSCOPY WITH PROPOFOL;  Surgeon: Robert Bellow, MD;  Location: [facility] ENDOSCOPY;  Service: Endoscopy;  Laterality: N/A;   CORONARY STENT INTERVENTION N/A 06/28/2017   Procedure: CORONARY STENT INTERVENTION;  Surgeon: Yolonda Kida, MD;  Location: [place] CV LAB;  Service: Cardiovascular;  Laterality: N/A;   EYE SURGERY     lens eye surgery   HERNIA REPAIR  [id number]   umbilical   LEFT HEART CATH AND CORONARY ANGIOGRAPHY N/A 06/28/2017   Procedure: LEFT HEART CATH AND CORONARY ANGIOGRAPHY;  Surgeon: Minna Merritts, MD;  Location: [place] CV LAB;  Service: Cardiovascular;  Laterality: N/A;   LUMBAR LAMINECTOMY/DECOMPRESSION MICRODISCECTOMY Bilateral 03/10/2018   Procedure: Laminectomy and Foraminotomy - Lumbar three-Lumbar four - Lumbar four-Lumbar five - bilateral;  Surgeon: Earnie Larsson, MD;  Location: [place];  Service: Neurosurgery;  Laterality: Bilateral;   NEPHRECTOMY  1992   left- cancer   PERIPHERAL VASCULAR THROMBECTOMY Right 10/01/2022   Procedure: PERIPHERAL VASCULAR THROMBECTOMY;  Surgeon: Algernon Huxley, MD;  Location: [place] CV LAB;  Service: Cardiovascular;  Laterality: Right;   RIB RESECTION     removal 4 ribs   TONSILLECTOMY      Current Medications: Current Meds  Medication Sig   allopurinol (ZYLOPRIM) 100 MG tablet Take 1 tablet (100 mg total) by mouth daily.   APIXABAN (ELIQUIS) VTE STARTER PACK ('10MG'$  AND '5MG'$ ) Take as directed on package: start with two-'5mg'$  tablets twice daily for 7 days. On day 8, switch to one-'5mg'$  tablet twice daily.   atorvastatin (LIPITOR) 80 MG tablet Take 1 tablet by mouth once daily   carvedilol (COREG) 25 MG tablet Take 1 tablet (25 mg total) by mouth 2 (two) times daily.   colchicine 0.6 MG tablet Take 1 tablet (0.6 mg  total) by mouth daily. Take 1 tablet TID until GI upset or flare subsides, then for maintenance, take 1 tablet daily.   esomeprazole (NEXIUM) 40 MG capsule Take 40 mg by mouth daily in the afternoon.   ibuprofen (ADVIL) 100 MG chewable tablet Chew by mouth every 8 (eight) hours as needed. Patient states MD told her she can took three to four advil a day for pain up until today.   isosorbide mononitrate (IMDUR) 30 MG 24 hr tablet TAKE 1 TABLET BY MOUTH IN THE MORNING AND AT BEDTIME   lidocaine (LIDODERM) 5 % Place 1 patch onto the skin daily. Remove & Discard patch within 12 hours or as directed by MD   losartan-hydrochlorothiazide (HYZAAR) 50-12.5 MG tablet Take 1/2 tablet once a day.   methylPREDNISolone (  MEDROL DOSEPAK) 4 MG TBPK tablet 6 day dose pack - take as directed   methylPREDNISolone (MEDROL DOSEPAK) 4 MG TBPK tablet Use as directed   Multiple Vitamins-Minerals (MULTIVITAMIN WITH MINERALS) tablet Take 1 tablet by mouth daily.   ondansetron (ZOFRAN-ODT) 4 MG disintegrating tablet Take 1 tablet (4 mg total) by mouth every 8 (eight) hours as needed for nausea or vomiting.   oxyCODONE-acetaminophen (PERCOCET) 5-325 MG tablet Take 1 tablet by mouth every 4 (four) hours as needed for severe pain.   traMADol (ULTRAM) 50 MG tablet Take 50 mg by mouth daily.    traMADol (ULTRAM) 50 MG tablet Take 1 tablet (50 mg total) by mouth every 6 (six) hours as needed.   Vibegron (GEMTESA) 75 MG TABS Take 75 mg by mouth daily.   [DISCONTINUED] aspirin EC 81 MG tablet Take 81 mg by mouth daily. Swallow whole.   Current Facility-Administered Medications for the 12/18/22 encounter (Office Visit) with Kathlen Mody, Danylah Holden H, PA-C  Medication   betamethasone acetate-betamethasone sodium phosphate (CELESTONE) injection 3 mg   dexamethasone (DECADRON) injection 4 mg   triamcinolone acetonide (KENALOG) 10 MG/ML injection 10 mg     Allergies:   Morphine and related, Prednisone, Amlodipine, and Latex   Social History    Socioeconomic History   Marital status: Single    Spouse name: Not on file   Number of children: Not on file   Years of education: Not on file   Highest education level: Not on file  Occupational History   Occupation: Metallurgist: [employer]  Tobacco Use   Smoking status: Never    Passive exposure: Never   Smokeless tobacco: Never  Vaping Use   Vaping Use: Never used  Substance and Sexual Activity   Alcohol use: No   Drug use: No   Sexual activity: Not on file  Other Topics Concern   Not on file  Social History Narrative   Not on file   Social Determinants of Health   Financial Resource Strain: Not on file  Food Insecurity: Not on file  Transportation Needs: Not on file  Physical Activity: Not on file  Stress: Not on file  Social Connections: Not on file     Family History: The patient's family history includes Cerebral aneurysm in her father; Colon cancer in her mother; Healthy in her son; Heart attack in her father; Heart attack (age of onset: 28) in her brother.  ROS:   Please see the history of present illness.     All other systems reviewed and are negative.  EKGs/Labs/Other Studies Reviewed:    The following studies were reviewed today:  Heart Monitor 2022   LHC 2018 2nd Mrg lesion, 90 %stenosed. 1st Diag-2 lesion, 75 %stenosed. 1st Diag-1 lesion, 80 %stenosed. Mid RCA lesion, 100 %stenosed. Prox LAD lesion, 35 %stenosed. The left ventricular ejection fraction is 55-65% by visual estimate. The left ventricular systolic function is normal.  coronary stent intervention A STENT XIENCE ALPINE RX 3.0X23 drug eluting stent was successfully placed, and does not overlap previously placed stent. 1st Mrg lesion, 75 %stenosed. Post intervention, there is a 0% residual stenosis.   Conclusions Successful PCI and stent of OM1 lesion 75% down to 0% with a DES   EKG:  EKG is ordered today.  The ekg ordered today demonstrates NSR 68bpm,  nonspecific ST/T wave changes  Recent Labs: 08/09/2022: TSH 2.34 08/23/2022: ALT 23 09/13/2022: Hemoglobin 14.0; Platelets 269; Potassium 3.9; Sodium 139 10/01/2022: BUN 16; Creatinine,  Ser 0.95  Recent Lipid Panel    Component Value Date/Time   CHOL 191 08/09/2022 1034   CHOL 169 06/26/2018 0823   TRIG 114 08/09/2022 1034   HDL 53 08/09/2022 1034   HDL 51 06/26/2018 0823   CHOLHDL 3.6 08/09/2022 1034   VLDL 27 06/25/2019 1018   LDLCALC 116 (H) 08/09/2022 1034     Physical Exam:    VS:  BP 130/80 (BP Location: Left Arm, Patient Position: Sitting, Cuff Size: Normal)   Pulse 68   Ht '5\' 10"'$  (1.778 m)   Wt 266 lb (120.7 kg)   SpO2 97%   BMI 38.17 kg/m     Wt Readings from Last 3 Encounters:  12/18/22 266 lb (120.7 kg)  11/23/22 266 lb (120.7 kg)  10/01/22 262 lb (118.8 kg)     GEN:  Well nourished, well developed in no acute distress HEENT: Normal NECK: No JVD; No carotid bruits LYMPHATICS: No lymphadenopathy CARDIAC: RRR, no murmurs, rubs, gallops RESPIRATORY:  Clear to auscultation without rales, wheezing or rhonchi  ABDOMEN: Soft, non-tender, non-distended MUSCULOSKELETAL:  right sided erythema, tenderness and edema; No deformity  SKIN: Warm and dry NEUROLOGIC:  Alert and oriented x 3 PSYCHIATRIC:  Normal affect   ASSESSMENT:    1. Pain and swelling of right lower leg   2. Deep vein thrombosis (DVT) of lower extremity, unspecified chronicity, unspecified laterality, unspecified vein (HCC)   3. Essential hypertension   4. Hyperlipidemia, mixed   5. Coronary artery disease involving native coronary artery of native heart without angina pectoris    PLAN:    In order of problems listed above:  Right lower leg swelling/tenderness/erythema Recent right lower leg DVT Patient had recent DVT in 08/2022 on the right side with extensive intervention by vascular surgery with mechanical thrombectomy to the right superficial femoral vein, common femoral vein and external  and common iliac veins, PTA right external and common iliac veins, stent placement of the right common and external iliac veins. She has been taking Eliquis '5mg'$  BID. She saw VS on 11/23/22 and still had some swelling and tenderness. Yesterday the patient noted acute swelling, pain and tenderness on the right leg with an obvious knot. I will send the patient for a STAT DVT study. She denies SOB. O2 is normal. Echo was previously ordered for SOB and LLE, we will get this scheduled.   Addendum: DVT study was negative. She will continue to follow-up with Vascular surgery. She was instructed to take lasix for swelling as needed.   CAD s/p PCI OM1 No chest pain reported. No ASA with Eliquis. Continue Lipitor, Imdur and Coreg. No plan for ischemic work-up at this time.   HLD LDL 116. Continue Lipitor '80mg'$  daily.   HTN BP is good today. Continue current medications.   Disposition: Follow up in 1 month(s) with MD/APP     Signed, Daelynn Blower Ninfa Meeker, PA-C  12/18/2022 2:42 PM    [facility]

## 2022-12-18 NOTE — Patient Instructions (Addendum)
Medication Instructions:  Your physician recommends that you continue on your current medications as directed. Please refer to the Current Medication list given to you today.  *If you need a refill on your cardiac medications before your next appointment, please call your pharmacy*   Lab Work: None ordered  If you have labs (blood work) drawn today and your tests are completely normal, you will receive your results only by: [place] (if you have MyChart) OR A paper copy in the mail If you have any lab test that is abnormal or we need to change your treatment, we will call you to review the results.   Testing/Procedures: Your physician has requested that you have an echocardiogram. Echocardiography is a painless test that uses sound waves to create images of your heart. It provides your doctor with information about the size and shape of your heart and how well your heart's chambers and valves are working. This procedure takes approximately one hour. There are no restrictions for this procedure. Please do NOT wear cologne, perfume, aftershave, or lotions (deodorant is allowed). Please arrive 15 minutes prior to your appointment time.   Your physician has requested that you have a lower or upper extremity venous duplex. This test is an ultrasound of the veins in the legs or arms. It looks at venous blood flow that carries blood from the heart to the legs or arms. Allow one hour for a Lower Venous exam. Allow thirty minutes for an Upper Venous exam. There are no restrictions or special instructions.     Follow-Up: At [facility], you and your health needs are our priority.  As part of our continuing mission to provide you with exceptional heart care, we have created designated Provider Care Teams.  These Care Teams include your primary Cardiologist (physician) and Advanced Practice Providers (APPs -  Physician Assistants and Nurse Practitioners) who all work together to provide  you with the care you need, when you need it.  We recommend signing up for the patient portal called "MyChart".  Sign up information is provided on this After Visit Summary.  MyChart is used to connect with patients for Virtual Visits (Telemedicine).  Patients are able to view lab/test results, encounter notes, upcoming appointments, etc.  Non-urgent messages can be sent to your provider as well.   To learn more about what you can do with MyChart, go to [email or web address].    Your next appointment:   1 month(s)  Provider:   You may see Ida Rogue, MD or one of the following Advanced Practice Providers on your designated Care Team:    Cadence Alamo, [place]

## 2022-12-24 ENCOUNTER — Other Ambulatory Visit (INDEPENDENT_AMBULATORY_CARE_PROVIDER_SITE_OTHER): Payer: Self-pay

## 2022-12-24 MED ORDER — APIXABAN 5 MG PO TABS: 5.0000 mg | ORAL_TABLET | Freq: Every day | ORAL | 3 refills | Status: DC

## 2023-01-09 ENCOUNTER — Telehealth: Payer: Self-pay | Admitting: Podiatry

## 2023-01-09 ENCOUNTER — Telehealth: Payer: Self-pay

## 2023-01-09 MED ORDER — COLCHICINE 0.6 MG PO TABS: 0.6000 mg | ORAL_TABLET | Freq: Every day | ORAL | 0 refills | Status: DC

## 2023-01-09 MED ORDER — ALLOPURINOL 100 MG PO TABS: 100.0000 mg | ORAL_TABLET | Freq: Every day | ORAL | 6 refills | Status: DC

## 2023-01-09 NOTE — Telephone Encounter (Signed)
Pt called and is at the pharmacy now and is in a lot of pain and has swelling and is asking for pain medication. She states that Colchicine was called in and she takes that daily and is not helping with the pain and swelling and burning. She was crying on the phone from pain

## 2023-01-09 NOTE — Addendum Note (Signed)
Addended by: Boneta Lucks on: 01/09/2023 02:57 PM   Modules accepted: Orders

## 2023-01-09 NOTE — Telephone Encounter (Signed)
Pt called again to enquire about her Rx for gout pain. She stated that the Rx for Colchicine that was sent to the pharmacy today, she already takes & it doesn't help w/ pain. Please advise

## 2023-01-09 NOTE — Telephone Encounter (Signed)
No further evaluation needed

## 2023-01-10 MED ORDER — INDOMETHACIN 50 MG PO CAPS: 50.0000 mg | ORAL_CAPSULE | Freq: Two times a day (BID) | ORAL | 1 refills | Status: DC

## 2023-01-10 NOTE — Telephone Encounter (Signed)
Spoke with patient giving information that  it is ok to use the voltaren gel and prescription has been sent to pharmacy, stated that she is at pharmacy now,wanted to thank everyone for their help.

## 2023-01-14 ENCOUNTER — Ambulatory Visit (INDEPENDENT_AMBULATORY_CARE_PROVIDER_SITE_OTHER): Payer: Medicare Other | Admitting: Family Medicine

## 2023-01-14 ENCOUNTER — Encounter: Payer: Self-pay | Admitting: Family Medicine

## 2023-01-14 VITALS — BP 130/78 | HR 55 | Ht 70.0 in | Wt 273.0 lb

## 2023-01-14 DIAGNOSIS — Z7689 Persons encountering health services in other specified circumstances: Secondary | ICD-10-CM | POA: Diagnosis not present

## 2023-01-14 DIAGNOSIS — M109 Gout, unspecified: Secondary | ICD-10-CM | POA: Diagnosis not present

## 2023-01-14 DIAGNOSIS — R600 Localized edema: Secondary | ICD-10-CM

## 2023-01-14 DIAGNOSIS — I25118 Atherosclerotic heart disease of native coronary artery with other forms of angina pectoris: Secondary | ICD-10-CM | POA: Diagnosis not present

## 2023-01-14 DIAGNOSIS — I10 Essential (primary) hypertension: Secondary | ICD-10-CM

## 2023-01-14 DIAGNOSIS — M48062 Spinal stenosis, lumbar region with neurogenic claudication: Secondary | ICD-10-CM

## 2023-01-14 DIAGNOSIS — E782 Mixed hyperlipidemia: Secondary | ICD-10-CM

## 2023-01-14 MED ORDER — METHYLPREDNISOLONE 4 MG PO TBPK: ORAL_TABLET | ORAL | 0 refills | Status: DC

## 2023-01-14 MED ORDER — FUROSEMIDE 20 MG PO TABS: 20.0000 mg | ORAL_TABLET | Freq: Every day | ORAL | 2 refills | Status: DC | PRN

## 2023-01-14 MED ORDER — ALLOPURINOL 100 MG PO TABS: 200.0000 mg | ORAL_TABLET | Freq: Every day | ORAL | 1 refills | Status: DC

## 2023-01-14 MED ORDER — TRAMADOL HCL 50 MG PO TABS: 50.0000 mg | ORAL_TABLET | ORAL | 2 refills | Status: DC | PRN

## 2023-01-14 NOTE — Patient Instructions (Addendum)
Thank you for coming to the office today.  For gout flare  Take Medrol dose pak steroid 6 day taper Keep on Colchicine daily Keep on Allopurinol 154m daily, new order for twice as many pills for dose increase 2 pills Allopurinol 1041mx2 = 20020maily in the future would recommend repeat URic acid and consider dose increase up to 300m31mDose inc on allopurinol 1-2 week after the gout flare has resolved.  Please call Dr Dew'Bunnie Dominoice to follow up on the Right Leg Swelling / DVT  Check with them on the Eliquis plan. Since it was only for 3 months.  ---------  Recommend OTC anti histamine - Loratadine / Zyrtec / Allegra once a day  Tramadol ordered    Please schedule a Follow-up Appointment to: Return in about 3 months (around 04/14/2023) for 3 month follow-up.  If you have any other questions or concerns, please feel free to call the office or send a message through [email or web address] may also schedule an earlier appointment if necessary.  Additionally, you may be receiving a survey about your experience at our office within a few days to 1 week by e-mail or mail. We value your feedback.  AlexNobie [place]nam [place]

## 2023-01-14 NOTE — Progress Notes (Signed)
Subjective:    Patient ID: Carrie Larsen, female    DOB: Nov 06, 1946, 77 y.o.   MRN: [id number]  Carrie Larsen is a 77 y.o. female presenting on 01/14/2023 for Establish Care  Here to establish care.  HPI  Right Foot Gout, Acute Today flare R Great toe [place], last injection 3 weeks ago with good relief. Now flare again - On Allopurinol 174m daily last Uric Acid 5.6 (07/2022) and Colchicine daily Still having [place]. [place] did not [place] - Using walker Methylprednisolone usually works for flares  Chronic Back Pain Previously managed by PCP and Spine Specialist. Dr HEarnie Larsson History of spine surgery Managed on Tramadol now, doing well. No longer returning to spine specialist  Right Lower Extremity DVT Followed Vascular [facility] Dr DLucky Cowboy11/6/23 with surgical thrombectomy, R Superficial femoral vein and other associated veins. She had Stent placement to R common / ext iliac veins. She was started on Eliquis 542mBID       07/26/2022    2:42 PM  Depression screen PHQ 2/9  Decreased Interest 0  Down, Depressed, Hopeless 0  PHQ - 2 Score 0    Past Medical History:  Diagnosis Date   Anxiety    Arthritis    osteoarthritis   CAD (coronary artery disease)    a. 2012 ETT: no ischemia;  b. 06/2017 NSTEMI/PCI: LM nl, LAD nl, D1 70-80p, LCX nl, OM1 90 (3.0 x 23 Xience [place]), RCA dominant, 100p/m, fills via L->R collats, EF 55-65%.   [facility]   a. s/p partial left nephrectomy.   Chicken pox    CME (cystoid macular edema), left 11/20/2018   [place] [place]   T3, N1, M0. colon    Colon polyp 2011   COVID-19 [id number] Diastolic dysfunction    a. 08/2015 Echo: EF 60-65%, no rwma, Gr1 DD, midlly dil LA, PASP 4012m. No significant valvular dzs; b. 06/2017 Echo: EF 60-65%, Gr1DD, mildly dil LA.   Essential hypertension    GERD (gastroesophageal reflux disease)    Gout    History of appendectomy 06/07/2015    History of kidney cancer 11/20/2018   History of kidney stones    passed - 2   Hyperlipidemia    Lumbar spinal stenosis    [place]   Nonexudative age-related macular degeneration, bilateral, early dry stage 11/20/2018   Obesity    Prediabetes    Pseudophakia of both eyes    Retinal cyst    Past Surgical History:  Procedure Laterality Date   ABDOMINAL HYSTERECTOMY     APPENDECTOMY  06/07/2015   CARDIAC CATHETERIZATION     CATARACT EXTRACTION W/PHACO Left 04/24/2016   Procedure: CATARACT EXTRACTION PHACO AND INTRAOCULAR LENS PLACEMENT ([place]geon: WilBirder RobsonD;  Location: [facility];  Service: Ophthalmology;  Laterality: Left;  [place].8 AP% 16.4 CDE 7.53 FLUID PACK LOT # [id number]  CATARACT EXTRACTION W/PHACO Right 06/05/2016   Procedure: CATARACT EXTRACTION PHACO AND INTRAOCULAR LENS PLACEMENT (IOC);  Surgeon: WilBirder RobsonD;  Location: [facility];  Service: Ophthalmology;  Laterality: Right;  [place].9 AP% 21.9 CDE 5.68 Fluid pack lot # [id number]  COLON RESECTION  03/04/1991   COLON SURGERY     COLONOSCOPY  03-14-10   Dr ByrBary Castillaubular adenoma at 25 cm.   COLONOSCOPY WITH PROPOFOL N/A 06/07/2015   Procedure: COLONOSCOPY WITH PROPOFOL;  Surgeon: JefForest Gleason  Bary Castilla, MD;  Location: [facility];  Service: Endoscopy;  Laterality: N/A;   COLONOSCOPY WITH PROPOFOL N/A 08/10/2020   Procedure: COLONOSCOPY WITH PROPOFOL;  Surgeon: Robert Bellow, MD;  Location: [facility] END[facility];  Service: Endoscopy;  Laterality: N/A;   CORONARY STENT INTERVENTION N/A 06/28/2017   Procedure: CORONARY STENT INTERVENTION;  Surgeon: Yolonda Kida, MD;  Location: [place] [facility];  Service: Cardiovascular;  Laterality: N/A;   EYE SURGERY     lens eye surgery   HERNIA REPAIR  [id number]   umbilical   LEFT HEART CATH AND CORONARY ANGIOGRAPHY N/A 06/28/2017   Procedure: LEFT HEART CATH AND CORONARY ANGIOGRAPHY;  Surgeon: Minna Merritts, MD;  Location: [place] [facility];  Service: Cardiovascular;  Laterality: N/A;   LUMBAR LAMINECTOMY/DECOMPRESSION MICRODISCECTOMY Bilateral 03/10/2018   Procedure: Laminectomy and Foraminotomy - Lumbar three-Lumbar four - Lumbar four-Lumbar five - bilateral;  Surgeon: Earnie Larsson, MD;  Location: [place];  Service: Neurosurgery;  Laterality: Bilateral;   NEPHRECTOMY  1992   left- cancer   PERIPHERAL VASCULAR THROMBECTOMY Right 10/01/2022   Procedure: PERIPHERAL VASCULAR THROMBECTOMY;  Surgeon: Algernon Huxley, MD;  Location: [place] [facility];  Service: Cardiovascular;  Laterality: Right;   RIB RESECTION     removal 4 ribs   TONSILLECTOMY     Social History   Socioeconomic History   Marital status: Single    Spouse name: Not on file   Number of children: Not on file   Years of education: Not on file   Highest education level: Not on file  Occupational History   Occupation: Metallurgist: [employer]  Tobacco Use   Smoking status: Never    Passive exposure: Never   Smokeless tobacco: Never  Vaping Use   Vaping Use: Never used  Substance and Sexual Activity   Alcohol use: No   Drug use: No   Sexual activity: Not on file  Other Topics Concern   Not on file  Social History Narrative   Not on file   Social Determinants of Health   Financial Resource Strain: Not on file  Food Insecurity: Not on file  Transportation Needs: Not on file  Physical Activity: Not on file  Stress: Not on file  Social Connections: Not on file  Intimate Partner Violence: Not on file   Family History  Problem Relation Age of Onset   Colon cancer Mother    Cerebral aneurysm Father    Heart attack Father    Heart attack Brother 61   Healthy Son    Current Outpatient Medications on File Prior to Visit  Medication Sig   apixaban (ELIQUIS) 5 MG TABS tablet Take 1 tablet (5 mg total) by mouth daily at 6 (six) AM.   atorvastatin (LIPITOR) 80 MG tablet Take 1 tablet by mouth once daily   carvedilol (COREG)  25 MG tablet Take 1 tablet (25 mg total) by mouth 2 (two) times daily.   colchicine 0.6 MG tablet Take 1 tablet (0.6 mg total) by mouth daily. Take 1 tablet TID until GI upset or flare subsides, then for maintenance, take 1 tablet daily.   esomeprazole (NEXIUM) 40 MG capsule Take 40 mg by mouth daily in the afternoon.   ibuprofen (ADVIL) 100 MG chewable tablet Chew by mouth every 8 (eight) hours as needed. Patient states MD told her she can took three to four advil a day for pain up until today.   isosorbide mononitrate (IMDUR) 30 MG 24 hr  tablet TAKE 1 TABLET BY MOUTH IN THE MORNING AND AT BEDTIME   lidocaine (LIDODERM) 5 % Place 1 patch onto the skin daily. Remove & Discard patch within 12 hours or as directed by MD   Multiple Vitamins-Minerals (MULTIVITAMIN WITH MINERALS) tablet Take 1 tablet by mouth daily.   Vibegron (GEMTESA) 75 MG TABS Take 75 mg by mouth daily.   nitroGLYCERIN (NITROSTAT) 0.4 MG SL tablet Place 1 tablet (0.4 mg total) under the tongue every 5 (five) minutes x 3 doses as needed for chest pain. (Patient not taking: Reported on 12/18/2022)   No current facility-administered medications on file prior to visit.    Review of Systems Per HPI unless specifically indicated above     Objective:    BP 130/78   Pulse (!) 55   Ht 5' 10"$  (1.778 m)   Wt 273 lb (123.8 kg)   SpO2 99%   BMI 39.17 kg/m   Wt Readings from Last 3 Encounters:  01/14/23 273 lb (123.8 kg)  12/18/22 266 lb (120.7 kg)  11/23/22 266 lb (120.7 kg)    Physical Exam Vitals and nursing note reviewed.  Constitutional:      General: She is not in acute distress.    Appearance: She is well-developed. She is obese. She is not diaphoretic.     Comments: Well-appearing, comfortable, cooperative  HENT:     Head: Normocephalic and atraumatic.  Eyes:     General:        Right eye: No discharge.        Left eye: No discharge.     Conjunctiva/sclera: Conjunctivae normal.  Neck:     Thyroid: No thyromegaly.   Cardiovascular:     Rate and Rhythm: Normal rate and regular rhythm.     Heart sounds: Normal heart sounds. No murmur heard. Pulmonary:     Effort: Pulmonary effort is normal. No respiratory distress.     Breath sounds: Normal breath sounds. No wheezing or rales.  Musculoskeletal:        General: Normal range of motion.     Cervical back: Normal range of motion and neck supple.     Right lower leg: Edema (+3 pitting) present.     Left lower leg: Edema present.     Comments: R foot great toe erythema and edema  Lymphadenopathy:     Cervical: No cervical adenopathy.  Skin:    General: Skin is warm and dry.     Findings: No erythema or rash.  Neurological:     Mental Status: She is alert and oriented to person, place, and time.  Psychiatric:        Behavior: Behavior normal.     Comments: Well groomed, good eye contact, normal speech and thoughts      Results for orders placed or performed during the hospital encounter of 10/01/22  BUN  Result Value Ref Range   BUN 16 8 - 23 mg/dL  Creatinine, serum  Result Value Ref Range   Creatinine, Ser 0.95 0.44 - 1.00 mg/dL   GFR, Estimated >60 >60 mL/min      Assessment & Plan:   Problem List Items Addressed This Visit     Coronary artery disease of native artery of native heart with stable angina pectoris (HCC)   Relevant Medications   methylPREDNISolone (MEDROL DOSEPAK) 4 MG TBPK tablet   furosemide (LASIX) 20 MG tablet   traMADol (ULTRAM) 50 MG tablet   Gout involving toe - Primary   Relevant  Medications   methylPREDNISolone (MEDROL DOSEPAK) 4 MG TBPK tablet   allopurinol (ZYLOPRIM) 100 MG tablet   traMADol (ULTRAM) 50 MG tablet   HTN (hypertension)   Relevant Medications   furosemide (LASIX) 20 MG tablet   Hyperlipidemia   Relevant Medications   furosemide (LASIX) 20 MG tablet   Lumbar stenosis with neurogenic claudication   Relevant Medications   methylPREDNISolone (MEDROL DOSEPAK) 4 MG TBPK tablet   traMADol  (ULTRAM) 50 MG tablet   Other Visit Diagnoses     Encounter to establish care with new doctor       Bilateral lower extremity edema       Relevant Medications   furosemide (LASIX) 20 MG tablet       Establish care with new PCP Review prior records  For gout flare Take Medrol dose pak steroid 6 day taper Keep on Colchicine daily Keep on Allopurinol 180m daily, new order for twice as many pills for dose increase 2 pills Allopurinol 1072mx2 = 2003maily in the future would recommend repeat URic acid and consider dose increase up to 300m106mDose inc on allopurinol 1-2 week after the gout flare has resolved.  RLE DVT Vascular Please call Dr Dew'Bunnie Dominoice to follow up on the Right Leg Swelling / DVT, discussion anticoagulation plan. Check with them on the Eliquis plan. Since it was only for 3 months.  Re order Lasix take 20-40mg34mNEEDED  HYPERTENSION Off Losartan-HCTZ due to risk of gout flares provoked. On Carvedilol 25mg 59m---------  Cough  Recommend OTC anti histamine - Loratadine / Zyrtec / Allegra once a day  Tramadol ordered  Meds ordered this encounter  Medications   methylPREDNISolone (MEDROL DOSEPAK) 4 MG TBPK tablet    Sig: 6 day dose pak taper instructions    Dispense:  21 tablet    Refill:  0   allopurinol (ZYLOPRIM) 100 MG tablet    Sig: Take 2 tablets (200 mg total) by mouth daily.    Dispense:  180 tablet    Refill:  1    Dose increase   furosemide (LASIX) 20 MG tablet    Sig: Take 1-2 tablets (20-40 mg total) by mouth daily as needed.    Dispense:  30 tablet    Refill:  2   traMADol (ULTRAM) 50 MG tablet    Sig: Take 1 tablet (50 mg total) by mouth every 4 (four) hours as needed.    Dispense:  30 tablet    Refill:  2      Follow up plan: Return in about 3 months (around 04/14/2023) for 3 month follow-up.  [facility] 01/14/2023, 10:05 AM

## 2023-01-31 ENCOUNTER — Ambulatory Visit: Payer: Medicare Other

## 2023-02-01 ENCOUNTER — Other Ambulatory Visit: Payer: Self-pay

## 2023-02-01 ENCOUNTER — Emergency Department
Admission: EM | Admit: 2023-02-01 | Discharge: 2023-02-01 | Disposition: A | Discharge status: Home / Self Care | Payer: Medicare Other | Attending: Student in an Organized Health Care Education/Training Program | Admitting: Student in an Organized Health Care Education/Training Program

## 2023-02-01 ENCOUNTER — Telehealth (INDEPENDENT_AMBULATORY_CARE_PROVIDER_SITE_OTHER): Payer: Self-pay | Admitting: Vascular Surgery

## 2023-02-01 ENCOUNTER — Emergency Department: Payer: Medicare Other

## 2023-02-01 ENCOUNTER — Ambulatory Visit: Payer: Self-pay | Admitting: *Deleted

## 2023-02-01 DIAGNOSIS — Z7901 Long term (current) use of anticoagulants: Secondary | ICD-10-CM | POA: Insufficient documentation

## 2023-02-01 DIAGNOSIS — M79604 Pain in right leg: Secondary | ICD-10-CM | POA: Diagnosis not present

## 2023-02-01 LAB — CBC WITH DIFFERENTIAL/PLATELET
Abs Immature Granulocytes: 0.03 10*3/uL (ref 0.00–0.07)
Basophils Absolute: 0 10*3/uL (ref 0.0–0.1)
Basophils Relative: 1 %
Eosinophils Absolute: 0.1 10*3/uL (ref 0.0–0.5)
Eosinophils Relative: 2 %
HCT: 42 % (ref 36.0–46.0)
Hemoglobin: 13.6 g/dL (ref 12.0–15.0)
Immature Granulocytes: 1 %
Lymphocytes Relative: 31 %
Lymphs Abs: 2 10*3/uL (ref 0.7–4.0)
MCH: 29.5 pg (ref 26.0–34.0)
MCHC: 32.4 g/dL (ref 30.0–36.0)
MCV: 91.1 fL (ref 80.0–100.0)
Monocytes Absolute: 0.6 10*3/uL (ref 0.1–1.0)
Monocytes Relative: 9 %
Neutro Abs: 3.6 10*3/uL (ref 1.7–7.7)
Neutrophils Relative %: 56 %
Platelets: 213 10*3/uL (ref 150–400)
RBC: 4.61 MIL/uL (ref 3.87–5.11)
RDW: 13.3 % (ref 11.5–15.5)
WBC: 6.4 10*3/uL (ref 4.0–10.5)
nRBC: 0 % (ref 0.0–0.2)

## 2023-02-01 LAB — BASIC METABOLIC PANEL
Anion gap: 6 (ref 5–15)
BUN: 14 mg/dL (ref 8–23)
CO2: 24 mmol/L (ref 22–32)
Calcium: 9.2 mg/dL (ref 8.9–10.3)
Chloride: 109 mmol/L (ref 98–111)
Creatinine, Ser: 0.85 mg/dL (ref 0.44–1.00)
GFR, Estimated: >60 mL/min (ref >60)
Glucose, Bld: 138 mg/dL — ABNORMAL HIGH (ref 70–99)
Potassium: 4.1 mmol/L (ref 3.5–5.1)
Sodium: 139 mmol/L (ref 135–145)

## 2023-02-01 LAB — LACTIC ACID, PLASMA: Lactic Acid, Venous: 1.5 mmol/L (ref 0.5–1.9)

## 2023-02-01 MED ORDER — ONDANSETRON HCL 4 MG/2ML IJ SOLN
4.0000 mg | Freq: Once | INTRAMUSCULAR | Status: AC
  Administered 2023-02-01: 4 mg via INTRAVENOUS
  Filled 2023-02-01: qty 2

## 2023-02-01 MED ORDER — COLCHICINE 0.6 MG PO TABS
0.6000 mg | ORAL_TABLET | Freq: Once | ORAL | Status: AC
  Administered 2023-02-01: 0.6 mg via ORAL
  Filled 2023-02-01: qty 1

## 2023-02-01 MED ORDER — OXYCODONE-ACETAMINOPHEN 5-325 MG PO TABS: 1.0000 | ORAL_TABLET | ORAL | 0 refills | Status: DC | PRN

## 2023-02-01 MED ORDER — OXYCODONE-ACETAMINOPHEN 5-325 MG PO TABS
1.0000 | ORAL_TABLET | Freq: Once | ORAL | Status: AC
  Administered 2023-02-01: 1 via ORAL
  Filled 2023-02-01: qty 1

## 2023-02-01 MED ORDER — MORPHINE SULFATE (PF) 4 MG/ML IV SOLN
4.0000 mg | INTRAVENOUS | Status: DC | PRN
  Administered 2023-02-01: 4 mg via INTRAVENOUS
  Filled 2023-02-01: qty 1

## 2023-02-01 NOTE — ED Provider Notes (Signed)
[facility] Provider Note    Event Date/Time   First MD Initiated Contact with Patient 02/01/23 1753     (approximate)   History   DVT (possible)   HPI  Carrie Larsen is a 77 y.o. female striae of DVT as well as gout status post thrombectomy for right-sided DVT presents to the ER for worsening calf pain that started yesterday complaining of right foot pain now rating all the way up and her right hip pain is now moderate to severe.  She is on Eliquis and has been compliant with her medications.  States this feels similar to her pain from November.  Denies any trauma.  Does have a history of gout but unsure if this feels like her gout.     Physical Exam   Triage Vital Signs: ED Triage Vitals  Enc Vitals Group     BP 02/01/23 1803 (!) 198/113     Pulse Rate 02/01/23 1753 75     Resp 02/01/23 1753 16     Temp 02/01/23 1753 98.1 F (36.7 C)     Temp Source 02/01/23 1753 Oral     SpO2 02/01/23 1753 99 %     Weight 02/01/23 1750 271 lb 2.7 oz (123 kg)     Height 02/01/23 1750 '5\' 10"'$  (1.778 m)     Head Circumference --      Peak Flow --      Pain Score 02/01/23 1749 9     Pain Loc --      Pain Edu? --      Excl. in [place]? --     Most recent vital signs: Vitals:   02/01/23 1753 02/01/23 1803  BP:  (!) 198/113  Pulse: 75   Resp: 16   Temp: 98.1 F (36.7 C)   SpO2: 99%      Constitutional: Alert  Eyes: Conjunctivae are normal.  Head: Atraumatic. Nose: No congestion/rhinnorhea. Mouth/Throat: Mucous membranes are moist.   Neck: Painless ROM.  Cardiovascular:   Good peripheral circulation. Respiratory: Normal respiratory effort.  No retractions.  Gastrointestinal: Soft and nontender.  Musculoskeletal:  there is some warmth to the right lower extremity.  Good distal pulses.  There is some redness of the base of the right hallux where patient states that she will frequently get gout.  She is able to range hip joint and knee but complaining of achy  pain radiating up to the hip.  No obvious deformity Neurologic:  MAE spontaneously. No gross focal neurologic deficits are appreciated.  Skin:  Skin is warm, dry and intact. No rash noted. Psychiatric: Mood and affect are normal. Speech and behavior are normal.    ED Results / Procedures / Treatments   Labs (all labs ordered are listed, but only abnormal results are displayed) Labs Reviewed  BASIC METABOLIC PANEL - Abnormal; Notable for the following components:      Result Value   Glucose, Bld 138 (*)    All other components within normal limits  CBC WITH DIFFERENTIAL/PLATELET  LACTIC ACID, PLASMA     EKG     RADIOLOGY Please see ED Course for my review and interpretation.  I personally reviewed all radiographic images ordered to evaluate for the above acute complaints and reviewed radiology reports and findings.  These findings were personally discussed with the patient.  Please see medical record for radiology report.    PROCEDURES:  Critical Care performed: No  Procedures   MEDICATIONS ORDERED IN ED: Medications  morphine (PF) 4 MG/ML injection 4 mg (4 mg Intravenous Given 02/01/23 1811)  colchicine tablet 0.6 mg (has no administration in time range)  oxyCODONE-acetaminophen (PERCOCET/ROXICET) 5-325 MG per tablet 1 tablet (has no administration in time range)  ondansetron (ZOFRAN) injection 4 mg (4 mg Intravenous Given 02/01/23 1810)     IMPRESSION / MDM / ASSESSMENT AND PLAN / ED COURSE  I reviewed the triage vital signs and the nursing notes.                              Differential diagnosis includes, but is not limited to, DVT, cellulitis, gout, musculoskeletal strain, arthritis, spasm, dissection, ischemia  Patient presenting to the ER for evaluation of symptoms as described above.  Based on symptoms, risk factors and considered above differential, this presenting complaint could reflect a potentially life-threatening illness therefore the patient will be  placed on continuous pulse oximetry and telemetry for monitoring.  Laboratory evaluation will be sent to evaluate for the above complaints.  Quite uncomfortable appearing will order IV morphine she is hypertensive and very anxious appearing but her exam is otherwise reassuring possible gout she is already on anticoagulation therefore have a lower suspicion for DVT but may be having some postthrombotic syndrome.  It appears well-perfused not consistent with acute ischemia.  Strong DP and PT pulses and triphasic signals bilaterally.   Clinical Course as of 02/01/23 1906  Fri Feb 01, 2023  1829 No leukocytosis.  Renal function normal. [PR]  1846 No evidence of DVT.  On ultrasound awaiting lactate as well as x-ray. [PR]  1854 Lactate is normal.  Have high suspicion this is gouty arthritis.  Patient will be signed out to oncoming physician pending follow-up imaging. [PR]  1857 X-ray hip on my review and interpretation without evidence of fracture. [PR]    Clinical Course User Index [PR] Merlyn Lot, MD   Patient reassessed pain significantly improved.  She feels significant relief after learning is not a DVT.  She is now complaining of more knee pain have low suspicion for infectious process.  Will redose her colchicine as her renal function will tolerate and she has been on this in the past do have a moderate suspicion this is a gout flare.  Patient be signed out to oncoming physician pending follow-up imaging and reassessment.  Anticipate she will be appropriate for discharge home.   FINAL CLINICAL IMPRESSION(S) / ED DIAGNOSES   Final diagnoses:  Right leg pain     Rx / DC Orders   ED Discharge Orders     None        Note:  This document was prepared using Dragon voice recognition software and may include unintentional dictation errors.    Merlyn Lot, MD 02/01/23 [phone]

## 2023-02-01 NOTE — Discharge Instructions (Signed)
Please follow-up with Dr. Bunnie Domino office.  I have sent a short prescription of pain medication to your arm to see.  Be sure to take your Eliquis as prescribed as well as colchicine.  Return for any worsening symptoms questions or concerns.

## 2023-02-01 NOTE — ED Provider Notes (Signed)
Patient states she is doing much better.  Pain, redness throughout the right lower leg is much improved after colchicine. Physical Exam  BP (!) 167/57 (BP Location: Left Wrist)   Pulse 68   Temp 98.1 F (36.7 C) (Oral)   Resp 18   Ht '5\' 10"'$  (1.778 m)   Wt 123 kg   SpO2 98%   BMI 38.91 kg/m   Physical Exam Right lower extremity with no tenderness to palpation.  No joint effusions.  No edema.  Normal pulses distally.  Bunion noted to the right great toe at the MCP.  She has no tenderness throughout the foot or ankle, no focal tenderness throughout the cuboid with deep palpation. Procedures  Procedures  ED Course / MDM   Clinical Course as of 02/01/23 2035  Fri Feb 01, 2023  1829 No leukocytosis.  Renal function normal. [PR]  1846 No evidence of DVT.  On ultrasound awaiting lactate as well as x-ray. [PR]  1854 Lactate is normal.  Have high suspicion this is gouty arthritis.  Patient will be signed out to oncoming physician pending follow-up imaging. [PR]  1857 X-ray hip on my review and interpretation without evidence of fracture. [PR]    Clinical Course User Index [PR] Merlyn Lot, MD   Medical Decision Making Amount and/or Complexity of Data Reviewed Labs: ordered. Radiology: ordered.  Risk Prescription drug management.  X-rays obtained of the right knee, right foot and right hip are independently reviewed by me today showing no evidence of definite acute bony abnormality.  There was some mild concern for linear lucency along the right foot cuboid but patient has no focal tenderness along this area.   77 year old female with right leg pain.  She has a history of gout and she is status post thrombectomy for right-sided DVT, on Eliquis.  Patient states her pain was severe throughout her right foot and radiating up to her hip.  X-rays negative for any acute bony abnormality, subtle lucency that was questionable for fracture along the cuboid of the right foot but she has no  tenderness with deep palpation along this part of her foot so I do not believe a fracture is present this area.  Patient was given colchicine and her symptoms have significantly improved.  Patient's vital signs are stable, she is stable and ready for discharge to home.  She is in agreement for discharge and will be given a short course of oxycodone as needed for pain and she will continue with home colchicine and Eliquis.  She understands signs and symptoms return to the ER for.       Carrie Larsen 02/01/23 2039    Merlyn Lot, MD 02/04/23 [phone]

## 2023-02-01 NOTE — Telephone Encounter (Signed)
Patient was notified with medical recommendations and verbalized that she will be going to the ED

## 2023-02-01 NOTE — Telephone Encounter (Signed)
Patient called and stated her foot is swollen and feels real hot to touch. She has a pitching feeling and pain up the side of her leg and some pain in her knee.  She also states her hip has a burning feeling.  Please advise.

## 2023-02-01 NOTE — Telephone Encounter (Signed)
If the patient is concerned she a blood clot she should present to the ED for evaluation.  It is possible that her issues are related to the recent DVT she had and post phlebitic issues.  Otherwise, we can try to move up her office visit to next week if there is available time

## 2023-02-01 NOTE — ED Notes (Signed)
Pt verbalizes understanding of discharge instructions. Opportunity for questioning and answers were provided. Pt discharged from ED to home with friend.   ° °

## 2023-02-01 NOTE — ED Triage Notes (Signed)
Pt to ED from home via [facility] for right sided calf pain that started yesterday and is warm to the touch. Pt had blood clot in November and this feels the same.   193/106 170/74  76HR  96% RA

## 2023-02-01 NOTE — Telephone Encounter (Signed)
There 3 weeks ago.   I have gout.   I'm new with Dr. Parks Ranger.    I had a swollen foot and flare up of gout of big toe.   I was given prednisone.   Stop Allopurinol.     I have another flare up now.   It's hot and swollen'  I had a blood clot 3-4 months ago.   Right leg I think I have a blood clot.    I called Dr. Lucky Cowboy and they told her to go to the ED now.    Do y'all do that scan there?   Reason for Disposition  [1] Thigh, calf, or ankle swelling AND [2] only 1 side    Referred to ED by Dr. Lucky Cowboy.  Answer Assessment - Initial Assessment Questions 1. ONSET: "When did the pain start?"      I think I have a blood clot in my right leg.   I had one 3-4 months ago and it feels and looks just like it did then.    I called Dr. Lucky Cowboy and he told me to go straight to the ED.   I'm calling to see if you do that scan for blood clots in your office.    I'm new to Dr. Parks Ranger.    I let her know she would need to go on to the ED.   We don't do the scan she needs in the office.  She also mentioned she is having a gout flare up in her big toe.   She saw Dr. Parks Ranger recently and put her on prednisone and told her to stop the Allopurinol.    I've completed the prednisone and now the gout has come back.    Can I resume the Allopurinol?    I let her know I would send a message to Dr. Parks Ranger but for now she needed to go on to the ED.   She was agreeable and is going.   Daughter coming to take her to the ED. 2. LOCATION: "Where is the pain located?"      Right leg 3. PAIN: "How bad is the pain?"    (Scale 1-10; or mild, moderate, severe)   -  MILD (1-3): doesn't interfere with normal activities    -  MODERATE (4-7): interferes with normal activities (e.g., work or school) or awakens from sleep, limping    -  SEVERE (8-10): excruciating pain, unable to do any normal activities, unable to walk     Moderate pain.    Feels same as precious blood clot.    Warm, swollen and painful. 4. WORK OR EXERCISE: "Has  there been any recent work or exercise that involved this part of the body?"      Not asked since Dr. Lucky Cowboy already directed her to go to the ED. 5. CAUSE: "What do you think is causing the leg pain?"     A blood clot again.   6. OTHER SYMPTOMS: "Do you have any other symptoms?" (e.g., chest pain, back pain, breathing difficulty, swelling, rash, fever, numbness, weakness)     Also having a gout flare up in my big toe.    Can I restart the Allopurinol now that I have finished the prednisone Dr. Parks Ranger put me on for the gout? 7. PREGNANCY: "Is there any chance you are pregnant?" "When was your last menstrual period?"     N/A due to age  Protocols used: Leg Pain-A-AH

## 2023-02-01 NOTE — Telephone Encounter (Signed)
  Chief Complaint: C/O right leg being swollen, warm and painful like when she had a blood clot in it 3-4 months ago.   She called Dr. Lucky Cowboy, vascular dr and was directed to go to the ED.    She is agreeable to going.  She also wanted to know if she could resume taking the Allopurinol now that she has completed the prednisone dose pack Dr. Parks Ranger gave her.   Seen on 2/19 for gout flare up (and to establish care). Symptoms: Pain in big toe again Frequency: Since completing the prednisone the gout has flared back up in her big toe. Pertinent Negatives: Patient denies N/A Disposition: [x] ED /[] Urgent Care (no appt availability in office) / [] Appointment(In office/virtual)/ []  [facility]/ [] Home Care/ [] Refused Recommended Disposition /[] [facility]/ [x]  Follow-up with PCP Additional Notes: I encouraged her to go on to the ED as directed by Dr. Lucky Cowboy.     "I'm waiting on my daughter to come get me now and take me".     I've sent a message to Dr. Parks Ranger regarding the Allopurinol and when she should resume taking it and to let him know she is going to the ED for possible blood clot in her right leg.

## 2023-02-01 NOTE — Telephone Encounter (Signed)
I reviewed her request.  Correct - she needs to go to ED, for acute DVT concerns and imaging. We would not do that especially on Friday afternoon.  For the gout question, I think there may be some confusion because I reviewed my note as below  For gout flare Take Medrol dose pak steroid 6 day taper Keep on Colchicine daily Keep on Allopurinol '100mg'$  daily, new order for twice as many pills for dose increase 2 pills Allopurinol '100mg'$  x2 = '200mg'$  daily in the future would recommend repeat URic acid and consider dose increase up to '300mg'$ .  I advised her to keep Allopurinol and to increase to 2 pills for '200mg'$  daily. Not to discontinue it during prednisone course  That is fine if she already discontinued it.  I would wait until AFTER the DVT is treated by ED and gout treatment before resuming it if she has been off for a few weeks  Restarting Allopurinol can cause gout flare again  Nobie Putnam, DO [facility] 02/01/2023, 4:18 PM

## 2023-02-04 ENCOUNTER — Ambulatory Visit: Payer: Medicare Other | Admitting: Medical

## 2023-02-05 ENCOUNTER — Encounter: Payer: Self-pay | Admitting: Family Medicine

## 2023-02-05 ENCOUNTER — Telehealth (INDEPENDENT_AMBULATORY_CARE_PROVIDER_SITE_OTHER): Payer: Self-pay

## 2023-02-05 ENCOUNTER — Ambulatory Visit (INDEPENDENT_AMBULATORY_CARE_PROVIDER_SITE_OTHER): Payer: Medicare Other | Admitting: Family Medicine

## 2023-02-05 VITALS — BP 134/82 | HR 56 | Ht 70.0 in | Wt 270.0 lb

## 2023-02-05 DIAGNOSIS — M109 Gout, unspecified: Secondary | ICD-10-CM

## 2023-02-05 MED ORDER — METHYLPREDNISOLONE 4 MG PO TBPK: ORAL_TABLET | ORAL | 0 refills | Status: DC

## 2023-02-05 MED ORDER — COLCIGEL EX GEL: CUTANEOUS | 2 refills | Status: DC

## 2023-02-05 NOTE — Telephone Encounter (Signed)
Patient left a voicemail informing that she was seen at ED for dvt. Patient had dvt ultrasound done with negative results. Patient wanted to know if she keep her upcoming appointment for 3/19. I spoke with Arna Medici NP she advise for  patient to keep appointment. I left a message on patient voicemail that she will need to keep upcoming as schedule.

## 2023-02-05 NOTE — Patient Instructions (Addendum)
Thank you for coming to the office today.  Start Medrol modified dose pak, 6 > 6 > 5 > 5 > 4 > 4 > 3 > 3 > 2 > 2 > 1 > 1 then discontinue, after 12 days.  Hold any ibuprofen  Start on Allopurinol '100mg'$  daily for next 2 weeks, then may go up to 2 tablet = '200mg'$  daily  HOLD Colchicine pills, UNTIL YOU FINISH Methylprednisolone Steroid then you can take Colchicine 1 tab DAILY to prevent future flare.  Leg cramps - Try spoonful of yellow mustard to relieve leg cramps or try daily to prevent the problem  - OTC natural option is Hyland's Leg Cramps (Dissolving tablet) take as needed for muscle cramps  Our goal is to prevent future gout flares. Try to avoid dietary triggers that are the most common causes of gout flares. - Avoid the following foods/drinks: - Red meat, organ meat (liver) - Alcohol (especially beer, also wine, liquor) - Processed foods / carbs (white bread, white rice, pasta, sugar) - Sugary drinks (sweet tea, soda) - Shellfish, shrimp / lobster  - Foods that are preferred to eat: - Beans, Lentils, Whole grains, Quinoa - Fruits, Vegetables - Dairy, Cheese, Yogurt - Soy based protein  Please schedule a Follow-up Appointment to: Return if symptoms worsen or fail to improve.  If you have any other questions or concerns, please feel free to call the office or send a message through [facility]. You may also schedule an earlier appointment if necessary.  Additionally, you may be receiving a survey about your experience at our office within a few days to 1 week by e-mail or mail. We value your feedback.  Nobie Putnam, DO [facility]

## 2023-02-05 NOTE — Progress Notes (Signed)
Subjective:    Patient ID: Carrie Larsen, female    DOB: April 29, 1946, 77 y.o.   MRN: [id number]  Carrie Larsen is a 77 y.o. female presenting on 02/05/2023 for Gout   HPI   ED FOLLOW-UP VISIT  Hospital/Location: [facility] Date of ED Visit: 02/01/23  Reason for Presenting to ED: Gout  FOLLOW-UP  - ED provider note and record have been reviewed - Patient presents today about 3 days after recent ED visit. Brief summary of recent course, patient had symptoms of acute gout flare that returned after initial treatment 2-3 weeks ago, presented to ED, testing in ED with labs and imaging, treated for acute gout . - Today reports overall has done well after discharge from ED. Symptoms of R toe pain have improved but not resolved. Still remains off Allopurinol for prevention.  - New medications on discharge: Oxycodone   I have reviewed the discharge medication list, and have reconciled the current and discharge medications today.       07/26/2022    2:42 PM  Depression screen PHQ 2/9  Decreased Interest 0  Down, Depressed, Hopeless 0  PHQ - 2 Score 0    Social History   Tobacco Use   Smoking status: Never    Passive exposure: Never   Smokeless tobacco: Never  Vaping Use   Vaping Use: Never used  Substance Use Topics   Alcohol use: No   Drug use: No    Review of Systems Per HPI unless specifically indicated above     Objective:    BP 134/82   Pulse (!) 56   Ht 5\' 10"  (1.778 m)   Wt 270 lb (122.5 kg)   SpO2 98%   BMI 38.74 kg/m   Wt Readings from Last 3 Encounters:  02/05/23 270 lb (122.5 kg)  02/01/23 271 lb 2.7 oz (123 kg)  01/14/23 273 lb (123.8 kg)    Physical Exam Vitals and nursing note reviewed.  Constitutional:      General: She is not in acute distress.    Appearance: Normal appearance. She is well-developed. She is not diaphoretic.     Comments: Well-appearing, comfortable, cooperative  HENT:     Head: Normocephalic and atraumatic.  Eyes:      General:        Right eye: No discharge.        Left eye: No discharge.     Conjunctiva/sclera: Conjunctivae normal.  Cardiovascular:     Rate and Rhythm: Normal rate.  Pulmonary:     Effort: Pulmonary effort is normal.  Musculoskeletal:     Comments: Right great toe and MTP with some inflammation, warmth, tenderness.  Skin:    General: Skin is warm and dry.     Findings: No erythema or rash.  Neurological:     Mental Status: She is alert and oriented to person, place, and time.  Psychiatric:        Mood and Affect: Mood normal.        Behavior: Behavior normal.        Thought Content: Thought content normal.     Comments: Well groomed, good eye contact, normal speech and thoughts     I have personally reviewed the radiology report from 02/01/23 US Doppler.   Study Result CLINICAL DATA: Right sided lower extremity swelling and pain  EXAM: Right LOWER EXTREMITY VENOUS DOPPLER ULTRASOUND  TECHNIQUE: Gray-scale sonography with compression, as well as color and duplex ultrasound, were performed to evaluate the  deep venous system(s) from the level of the common femoral vein through the popliteal and proximal calf veins.  COMPARISON: None Available.  FINDINGS: VENOUS  Normal compressibility of the common femoral, superficial femoral, and popliteal veins, as well as the visualized calf veins. Visualized portions of profunda femoral vein and great saphenous vein unremarkable. No filling defects to suggest DVT on grayscale or color Doppler imaging. Doppler waveforms show normal direction of venous flow, normal respiratory plasticity and response to augmentation.  Limited views of the contralateral common femoral vein are unremarkable.  OTHER  None.  Limitations: none  IMPRESSION: No evidence of right lower extremity DVT   Electronically Signed By: Karen Kays M.D. On: 02/01/2023 18:43  -----------------------------------  Study Result CLINICAL DATA: Right  knee pain. No known injury.  EXAM: RIGHT KNEE - COMPLETE 4+ VIEW  COMPARISON: 08/30/2014  FINDINGS: No evidence of fracture, dislocation, or joint effusion. Mild medial compartment joint space narrowing. No significant osteophytes. No erosive change. Scattered phleboliths in the subcutaneous tissues. There are vascular calcifications.  IMPRESSION: No acute findings. Mild degenerative change without significant progression from 2015.   Electronically Signed By: Narda Rutherford M.D. On: 02/01/2023 19:28   Study Result CLINICAL DATA: right hip and foot pain, eval for fracture  EXAM: DG HIP (WITH OR WITHOUT PELVIS) 2-3V RIGHT  COMPARISON: X-ray abdomen 01/17/2008  FINDINGS: There is no evidence of hip fracture or dislocation of the right hip. Frontal view of the left hip unremarkable. No acute displaced fracture or diastasis of the bones of the pelvis. There is no evidence of arthropathy or other focal bone abnormality.  Severe degenerative changes and dextroscoliosis of the July is a lower lumbar spine.  Right iliac vessel stent. Vascular calcifications.  IMPRESSION: Negative for acute traumatic injury.   Electronically Signed By: Tish Frederickson M.D. On: 02/01/2023 19:02   Study Result CLINICAL DATA: right hip and foot pain, eval for fracture  EXAM: RIGHT FOOT COMPLETE - 3+ VIEW  COMPARISON: None Available.  FINDINGS: Linear lucency and cortical irregularity of the cuboid along the cuboid calcaneal joint on oblique view of unclear etiology. There is no evidence of fracture or dislocation. First digit metatarsophalangeal joint moderate to severe degenerative changes and lateral subluxation of the proximal phalanx in relation to the metatarsal head. Plantar calcaneal spur. Soft tissues are unremarkable.  IMPRESSION: 1. No acute displaced fracture or dislocation. 2. Linear lucency and cortical irregularity of the cuboid along the cuboid calcaneal joint  on oblique view of unclear etiology. Correlate with point tenderness to palpation to evaluate for an acute nondisplaced fracture.   Electronically Signed By: Tish Frederickson M.D. On: 02/01/2023 19:05  Results for orders placed or performed during the hospital encounter of 02/01/23  CBC with Differential  Result Value Ref Range   WBC 6.4 4.0 - 10.5 K/uL   RBC 4.61 3.87 - 5.11 MIL/uL   Hemoglobin 13.6 12.0 - 15.0 g/dL   HCT 09.8 11.9 - 14.7 %   MCV 91.1 80.0 - 100.0 fL   MCH 29.5 26.0 - 34.0 pg   MCHC 32.4 30.0 - 36.0 g/dL   RDW 82.9 56.2 - 13.0 %   Platelets 213 150 - 400 K/uL   nRBC 0.0 0.0 - 0.2 %   Neutrophils Relative % 56 %   Neutro Abs 3.6 1.7 - 7.7 K/uL   Lymphocytes Relative 31 %   Lymphs Abs 2.0 0.7 - 4.0 K/uL   Monocytes Relative 9 %   Monocytes Absolute 0.6 0.1 - 1.0 K/uL  Eosinophils Relative 2 %   Eosinophils Absolute 0.1 0.0 - 0.5 K/uL   Basophils Relative 1 %   Basophils Absolute 0.0 0.0 - 0.1 K/uL   Immature Granulocytes 1 %   Abs Immature Granulocytes 0.03 0.00 - 0.07 K/uL  Basic metabolic panel  Result Value Ref Range   Sodium 139 135 - 145 mmol/L   Potassium 4.1 3.5 - 5.1 mmol/L   Chloride 109 98 - 111 mmol/L   CO2 24 22 - 32 mmol/L   Glucose, Bld 138 (H) 70 - 99 mg/dL   BUN 14 8 - 23 mg/dL   Creatinine, Ser 0.98 0.44 - 1.00 mg/dL   Calcium 9.2 8.9 - 11.9 mg/dL   GFR, Estimated >14 >78 mL/min   Anion gap 6 5 - 15  Lactic acid, plasma  Result Value Ref Range   Lactic Acid, Venous 1.5 0.5 - 1.9 mmol/L      Assessment & Plan:   Problem List Items Addressed This Visit     Gout involving toe - Primary   Relevant Medications   methylPREDNISolone (MEDROL DOSEPAK) 4 MG TBPK tablet   Homeopathic Products (COLCIGEL) GEL    ED follow-up Still has R great toe podagra area with gout flare now improving. But has redness vs ecchymosis present. Pain and sensitivity improved but still present.  Due to episodic duration of gout flare, will resume steroid  again as that was only treatment that resolved or helped it in Feb  She cannot take Prednisone  Extend taper over 12 days now. Start Medrol modified dose pak, 6 > 6 > 5 > 5 > 4 > 4 > 3 > 3 > 2 > 2 > 1 > 1 then discontinue, after 12 days.  Hold any ibuprofen oral nsaid  REStart on Allopurinol 100mg  daily for next 2 weeks, then may go up to 2 tablet = 200mg  daily  HOLD Colchicine pills, UNTIL YOU FINISH Methylprednisolone Steroid then you can take Colchicine 1 tab DAILY to prevent future flare.  Leg Cramp advice given  No orders of the defined types were placed in this encounter.    Meds ordered this encounter  Medications   methylPREDNISolone (MEDROL DOSEPAK) 4 MG TBPK tablet    Sig: Start with 6 tablets for 2 days, then 5 tab for 2 days, then 4 tab for 2 days, then 3 tab for 2 days, then 2 tab for 2 days, then 1 tab for 2 days, then discontinue. Take with meal each day, should last 12 days total therapy.    Dispense:  42 tablet    Refill:  0   Homeopathic Products (COLCIGEL) GEL    Sig: Apply gel topical as needed up to 2 times a day for gout.    Dispense:  15 mL    Refill:  2      Follow up plan: Return if symptoms worsen or fail to improve.   Saralyn Pilar, DO [facility] 02/05/2023, 1:25 PM

## 2023-02-06 ENCOUNTER — Encounter: Payer: Self-pay | Admitting: Family Medicine

## 2023-02-11 ENCOUNTER — Other Ambulatory Visit (INDEPENDENT_AMBULATORY_CARE_PROVIDER_SITE_OTHER): Payer: Self-pay | Admitting: Vascular Surgery

## 2023-02-11 DIAGNOSIS — R0989 Other specified symptoms and signs involving the circulatory and respiratory systems: Secondary | ICD-10-CM

## 2023-02-12 ENCOUNTER — Encounter (INDEPENDENT_AMBULATORY_CARE_PROVIDER_SITE_OTHER): Payer: Medicare Other

## 2023-02-12 ENCOUNTER — Ambulatory Visit (INDEPENDENT_AMBULATORY_CARE_PROVIDER_SITE_OTHER): Payer: Medicare Other | Admitting: Vascular Surgery
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
## 2023-02-25 ENCOUNTER — Ambulatory Visit: Payer: Medicare Other | Admitting: Urology

## 2023-02-27 ENCOUNTER — Encounter: Payer: Self-pay | Admitting: Family Medicine

## 2023-02-27 ENCOUNTER — Ambulatory Visit (INDEPENDENT_AMBULATORY_CARE_PROVIDER_SITE_OTHER): Payer: Medicare Other | Admitting: Family Medicine

## 2023-02-27 VITALS — BP 156/90 | HR 64 | Temp 97.1°F | Wt 272.0 lb

## 2023-02-27 DIAGNOSIS — Z20822 Contact with and (suspected) exposure to covid-19: Secondary | ICD-10-CM

## 2023-02-27 LAB — POC COVID19 BINAXNOW: SARS Coronavirus 2 Ag: NEGATIVE

## 2023-02-27 NOTE — Progress Notes (Signed)
Subjective:    Patient ID: Carrie Larsen, female    DOB: 1946-11-10, 77 y.o.   MRN: [id number]  Carrie Larsen is a 77 y.o. female presenting on 02/27/2023 for Covid Exposure   HPI  COVID Exposure Reports onset Saturday over weekend she felt tired and drained and "yucky" but wanted to be screened for COVID since she works at [employer] and high volume of covid patients there. Denies any fever chills body aches      02/27/2023    2:32 PM 07/26/2022    2:42 PM  Depression screen PHQ 2/9  Decreased Interest 0 0  Down, Depressed, Hopeless 0 0  PHQ - 2 Score 0 0  Altered sleeping 0   Tired, decreased energy 0   Change in appetite 0   Feeling bad or failure about yourself  0   Trouble concentrating 0   Moving slowly or fidgety/restless 0   Suicidal thoughts 0   PHQ-9 Score 0   Difficult doing work/chores Not difficult at all     Social History   Tobacco Use   Smoking status: Never    Passive exposure: Never   Smokeless tobacco: Never  Vaping Use   Vaping Use: Never used  Substance Use Topics   Alcohol use: No   Drug use: No    Review of Systems Per HPI unless specifically indicated above     Objective:    BP (!) 156/90 (BP Location: Left Arm, Patient Position: Sitting, Cuff Size: Normal)   Pulse 64   Temp (!) 97.1 F (36.2 C) (Temporal)   Wt 272 lb (123.4 kg)   SpO2 98%   BMI 39.03 kg/m   Wt Readings from Last 3 Encounters:  02/27/23 272 lb (123.4 kg)  02/05/23 270 lb (122.5 kg)  02/01/23 271 lb 2.7 oz (123 kg)    Physical Exam Vitals and nursing note reviewed.  Constitutional:      General: She is not in acute distress.    Appearance: She is well-developed. She is obese. She is not diaphoretic.     Comments: Well-appearing, comfortable, cooperative  HENT:     Head: Normocephalic and atraumatic.  Eyes:     General:        Right eye: No discharge.        Left eye: No discharge.     Conjunctiva/sclera: Conjunctivae normal.  Neck:     Thyroid:  No thyromegaly.  Cardiovascular:     Rate and Rhythm: Normal rate and regular rhythm.     Heart sounds: Normal heart sounds. No murmur heard. Pulmonary:     Effort: Pulmonary effort is normal. No respiratory distress.     Breath sounds: Normal breath sounds. No wheezing or rales.  Musculoskeletal:        General: Normal range of motion.     Cervical back: Normal range of motion and neck supple.  Lymphadenopathy:     Cervical: No cervical adenopathy.  Skin:    General: Skin is warm and dry.     Findings: No erythema or rash.  Neurological:     Mental Status: She is alert and oriented to person, place, and time.  Psychiatric:        Behavior: Behavior normal.     Comments: Well groomed, good eye contact, normal speech and thoughts      Results for orders placed or performed in visit on 02/27/23  POC COVID-19  Result Value Ref Range   SARS Coronavirus 2  Ag Negative Negative      Assessment & Plan:   Problem List Items Addressed This Visit   None Visit Diagnoses     Close exposure to COVID-19 virus    -  Primary   Relevant Orders   POC COVID-19 (Completed)       Negative COVID test.  Asymptomatic  at this point except some fatigue. Suspect not infectious viral etiology. No treatment today. Supportive care  No orders of the defined types were placed in this encounter.     Follow up plan: Return if symptoms worsen or fail to improve.   Nobie Putnam, [facility] 02/27/2023, 2:31 PM

## 2023-02-27 NOTE — Patient Instructions (Addendum)
Thank you for coming to the office today.  COVID test negative today With limited symptoms, I believe it is fatigue / drained, may be allergy related but at the moment. No additional treatment for infection required.  Please schedule a Follow-up Appointment to: Return if symptoms worsen or fail to improve.  If you have any other questions or concerns, please feel free to call the office or send a message through [place]. You may also schedule an earlier appointment if necessary.  Additionally, you may be receiving a survey about your experience at our office within a few days to 1 week by e-mail or mail. We value your feedback.  Nobie Putnam, DO [place]

## 2023-03-03 NOTE — Progress Notes (Deleted)
03/03/23 11:15 AM   Carrie Larsen [facility] Apr 26, 1946 [id number]  Referring provider:  Smitty Cords, DO [address] [place],  [place] [address]  Urological history  1. Renal cell carcinoma - Larsen/p left nephrectomy ~27 years ago for RCC - RUS 12/2021 - NED   2. Nephrolithiasis - no documented stones > 10 years - RUS on 01/22/2022 showed no stone burden    HPI: Carrie Larsen is a 77 y.o.female who presents today for a 1 year follow-up.  PVR ***   PMH: Past Medical History:  Diagnosis Date   Anxiety    Arthritis    osteoarthritis   CAD (coronary artery disease)    a. 2012 ETT: no ischemia;  b. 06/2017 NSTEMI/PCI: LM nl, LAD nl, D1 70-80p, LCX nl, OM1 90 (3.0 x 23 Xience [place]), RCA dominant, 100p/m, fills via L->R collats, EF 55-65%.   Cancer    a. Larsen/p partial left nephrectomy.   Chicken pox    CME (cystoid macular edema), left 11/20/2018   Colon cancer 1992   T3, N1, M0. colon    Colon polyp 2011   COVID-19 07/2019   Diastolic dysfunction    a. 08/2015 Echo: EF 60-65%, no rwma, Gr1 DD, midlly dil LA, PASP . No significant valvular dzs; b. 06/2017 Echo: EF 60-65%, Gr1DD, mildly dil LA.   Essential hypertension    GERD (gastroesophageal reflux disease)    Gout    History of appendectomy 06/07/2015   History of kidney cancer 11/20/2018   History of kidney stones    passed - 2   Hyperlipidemia    Lumbar spinal stenosis    Myocardial infarction    06/2017   Nonexudative age-related macular degeneration, bilateral, early dry stage 11/20/2018   Obesity    Prediabetes    Pseudophakia of both eyes    Retinal cyst     Surgical History: Past Surgical History:  Procedure Laterality Date   ABDOMINAL HYSTERECTOMY     APPENDECTOMY  06/07/2015   CARDIAC CATHETERIZATION     CATARACT EXTRACTION W/PHACO Left 04/24/2016   Procedure: CATARACT EXTRACTION PHACO AND INTRAOCULAR LENS PLACEMENT (IOC);  Surgeon: Carrie Manila, MD;  Location: [facility] ORS;  Service: Ophthalmology;   Laterality: Left;  [place] 45.8 AP% 16.4 CDE 7.53 FLUID PACK LOT # [id number]   CATARACT EXTRACTION W/PHACO Right 06/05/2016   Procedure: CATARACT EXTRACTION PHACO AND INTRAOCULAR LENS PLACEMENT (IOC);  Surgeon: Carrie Manila, MD;  Location: [facility] ORS;  Service: Ophthalmology;  Laterality: Right;  [place] 25.9 AP% 21.9 CDE 5.68 Fluid pack lot # [id number]   COLON RESECTION  03/04/1991   COLON SURGERY     COLONOSCOPY  03-14-10   Dr Lemar Livings, tubular adenoma at 25 cm.   COLONOSCOPY WITH PROPOFOL N/A 06/07/2015   Procedure: COLONOSCOPY WITH PROPOFOL;  Surgeon: Carrie Mayotte, MD;  Location: [facility] ENDOSCOPY;  Service: Endoscopy;  Laterality: N/A;   COLONOSCOPY WITH PROPOFOL N/A 08/10/2020   Procedure: COLONOSCOPY WITH PROPOFOL;  Surgeon: Carrie Mayotte, MD;  Location: [facility] ENDOSCOPY;  Service: Endoscopy;  Laterality: N/A;   CORONARY STENT INTERVENTION N/A 06/28/2017   Procedure: CORONARY STENT INTERVENTION;  Surgeon: Carrie Pea, MD;  Location: [facility] INVASIVE CV LAB;  Service: Cardiovascular;  Laterality: N/A;   EYE SURGERY     lens eye surgery   HERNIA REPAIR  1993   umbilical   LEFT HEART CATH AND CORONARY ANGIOGRAPHY N/A 06/28/2017   Procedure: LEFT HEART CATH AND CORONARY ANGIOGRAPHY;  Surgeon: Carrie Iba, MD;  Location: [facility] INVASIVE CV LAB;  Service: Cardiovascular;  Laterality: N/A;   LUMBAR LAMINECTOMY/DECOMPRESSION MICRODISCECTOMY Bilateral 03/10/2018   Procedure: Laminectomy and Foraminotomy - Lumbar three-Lumbar four - Lumbar four-Lumbar five - bilateral;  Surgeon: Carrie Larsen, Henry, MD;  Location: [facility] OR;  Service: Neurosurgery;  Laterality: Bilateral;   NEPHRECTOMY  1992   left- cancer   PERIPHERAL VASCULAR THROMBECTOMY Right 10/01/2022   Procedure: PERIPHERAL VASCULAR THROMBECTOMY;  Surgeon: Carrie Larsen, Carrie S, MD;  Location: [facility] INVASIVE CV LAB;  Service: Cardiovascular;  Laterality: Right;   RIB RESECTION     removal 4 ribs   TONSILLECTOMY      Home Medications:  Allergies as of  03/04/2023       Reactions   Morphine And Related Nausea Only   Prednisone Nausea And Vomiting   Amlodipine Palpitations   Latex Rash        Medication List        Accurate as of March 03, 2023 11:15 AM. If you have any questions, ask your nurse or doctor.          allopurinol 100 MG tablet Commonly known as: ZYLOPRIM Take 2 tablets (200 mg total) by mouth daily.   apixaban 5 MG Tabs tablet Commonly known as: Eliquis Take 1 tablet (5 mg total) by mouth daily at 6 (six) AM.   atorvastatin 80 MG tablet Commonly known as: LIPITOR Take 1 tablet by mouth once daily   carvedilol 25 MG tablet Commonly known as: COREG Take 1 tablet (25 mg total) by mouth 2 (two) times daily.   colchicine 0.6 MG tablet Take 1 tablet (0.6 mg total) by mouth daily. Take 1 tablet TID until GI upset or flare subsides, then for maintenance, take 1 tablet daily.   ColciGel Gel Apply gel topical as needed up to 2 times a day for gout.   esomeprazole 40 MG capsule Commonly known as: NEXIUM Take 40 mg by mouth daily in the afternoon.   furosemide 20 MG tablet Commonly known as: LASIX Take 1-2 tablets (20-40 mg total) by mouth daily as needed.   Gemtesa 75 MG Tabs Generic drug: Vibegron Take 75 mg by mouth daily.   ibuprofen 100 MG chewable tablet Commonly known as: ADVIL Chew by mouth every 8 (eight) hours as needed. Patient states MD told her she can took three to four advil a day for pain up until today.   isosorbide mononitrate 30 MG 24 hr tablet Commonly known as: IMDUR TAKE 1 TABLET BY MOUTH IN THE MORNING AND AT BEDTIME   lidocaine 5 % Commonly known as: LIDODERM Place 1 patch onto the skin daily. Remove & Discard patch within 12 hours or as directed by MD   multivitamin with minerals tablet Take 1 tablet by mouth daily.   nitroGLYCERIN 0.4 MG SL tablet Commonly known as: NITROSTAT Place 1 tablet (0.4 mg total) under the tongue every 5 (five) minutes x 3 doses as needed for  chest pain.   traMADol 50 MG tablet Commonly known as: ULTRAM Take 1 tablet (50 mg total) by mouth every 4 (four) hours as needed.        Allergies:  Allergies  Allergen Reactions   Morphine And Related Nausea Only   Prednisone Nausea And Vomiting   Amlodipine Palpitations   Latex Rash    Family History: Family History  Problem Relation Age of Onset   Colon cancer Mother    Cerebral aneurysm Father    Heart attack Father    Heart attack  Brother 81   Healthy Son     Social History:  reports that she has never smoked. She has never been exposed to tobacco smoke. She has never used smokeless tobacco. She reports that she does not drink alcohol and does not use drugs.   Physical Exam: There were no vitals taken for this visit.  Constitutional:  Well nourished. Alert and oriented, No acute distress. HEENT: [place] AT, moist mucus membranes.  Trachea midline, no masses. Cardiovascular: No clubbing, cyanosis, or edema. Respiratory: Normal respiratory effort, no increased work of breathing. GU: No CVA tenderness.  No bladder fullness or masses. Vulvovaginal atrophy w/ pallor, loss of rugae, introital retraction, excoriations.  Vulvar thinning, fusion of labia, clitoral hood retraction, prominent urethral meatus.   *** external genitalia, *** pubic hair distribution, no lesions.  Normal urethral meatus, no lesions, no prolapse, no discharge.   No urethral masses, tenderness and/or tenderness. No bladder fullness, tenderness or masses. *** vagina mucosa, *** estrogen effect, no discharge, no lesions, *** pelvic support, *** cystocele and *** rectocele noted.  No cervical motion tenderness.  Uterus is freely mobile and non-fixed.  No adnexal/parametria masses or tenderness noted.  Anus and perineum are without rashes or lesions.   ***  Neurologic: Grossly intact, no focal deficits, moving all 4 extremities. Psychiatric: Normal mood and affect.    Laboratory Data:    Latest Ref Rng & Units  02/01/2023    5:56 PM 09/13/2022    3:59 PM 08/09/2022   10:34 AM  CBC  WBC 4.0 - 10.5 K/uL 6.4  7.7  9.3   Hemoglobin 12.0 - 15.0 g/dL 16.1  09.6  04.5   Hematocrit 36.0 - 46.0 % 42.0  43.9  43.7   Platelets 150 - 400 K/uL 213  269  306       Latest Ref Rng & Units 02/01/2023    5:56 PM 10/01/2022    2:43 PM 09/13/2022    3:59 PM  CMP  Glucose 70 - 99 mg/dL 409   98   BUN 8 - 23 mg/dL 14  16  20    Creatinine 0.44 - 1.00 mg/dL 8.11  9.14  7.82   Sodium 135 - 145 mmol/L 139   139   Potassium 3.5 - 5.1 mmol/L 4.1   3.9   Chloride 98 - 111 mmol/L 109   105   CO2 22 - 32 mmol/L 24   25   Calcium 8.9 - 10.3 mg/dL 9.2   9.4     Legend: (H) High I have reviewed the labs.   Pertinent Imaging: ***  I have independently reviewed the films.  See HPI.    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 Renal cyst  - Appreciated on RUS   3. Nocturia -We had addressed this issue at a visit in April and since the fire alarm started during her visit, she was going to retry the [place] samples and return in 3 weeks and if she found the Gemtesa samples helpful we were going to send in a prescription for trospium 20 mg nightly -Her urinalysis was fairly similar today versus the one that she had in the ER few weeks ago and the culture result from that was negative, so she is not having urinary tract infection symptoms today and therefore I will not send this urine for culture at this time  No follow-ups on file.  Ragina Fenter, PA-C   [facility] [address], [address] [place], [place] [address] [phone]

## 2023-03-04 ENCOUNTER — Ambulatory Visit: Payer: Medicare Other | Admitting: Urology

## 2023-03-04 DIAGNOSIS — Z905 Acquired absence of kidney: Secondary | ICD-10-CM

## 2023-03-04 DIAGNOSIS — R351 Nocturia: Secondary | ICD-10-CM

## 2023-03-11 ENCOUNTER — Other Ambulatory Visit: Payer: Self-pay | Admitting: Medical

## 2023-03-13 ENCOUNTER — Telehealth: Payer: Self-pay | Admitting: Family Medicine

## 2023-03-13 NOTE — Telephone Encounter (Signed)
Contacted Carrie Larsen [place] to schedule their annual wellness visit. Appointment made for 04/04/2023.  Carrie Larsen; Care Guide Ambulatory Clinical Support [place] [facility] Direct Dial: [phone]

## 2023-03-13 NOTE — Telephone Encounter (Signed)
Called patient to schedule Medicare Annual Wellness Visit (AWV). Left message for patient to call back and schedule Medicare Annual Wellness Visit (AWV).  Last date of AWV: NONE  Please schedule an appointment at any time with Lorrie Barnes, LPN .  If any questions, please contact me.  Thank you ,  Kailen Hinkle [place]; Care Guide Ambulatory Clinical Support [place] [facility] Direct Dial: [phone]   

## 2023-03-14 ENCOUNTER — Ambulatory Visit: Payer: Medicare Other | Admitting: Podiatry

## 2023-03-14 ENCOUNTER — Other Ambulatory Visit: Payer: Self-pay | Admitting: Family Medicine

## 2023-03-14 DIAGNOSIS — M109 Gout, unspecified: Secondary | ICD-10-CM

## 2023-03-15 ENCOUNTER — Ambulatory Visit: Payer: Medicare Other | Admitting: Podiatry

## 2023-03-15 NOTE — Telephone Encounter (Signed)
Requested medication (s) are due for refill today:   Provider to review  Requested medication (s) are on the active medication list:   Yes but listed as not taking on 02/27/2023  Future visit scheduled:   No   Last ordered: 02/05/2023 15 ml, 2 refills  Returned because no protocol for this medication and it looks like she is not using it now.      Requested Prescriptions  Pending Prescriptions Disp Refills   Homeopathic Products (COLCIGEL) GEL [Pharmacy Med Name: COLCIGEL GEL 30ML] 120 mL     Sig: APPLY TO THE AFFECTED AREA 1-2 TIMES DAILY     There is no refill protocol information for this order

## 2023-03-18 ENCOUNTER — Ambulatory Visit: Payer: Medicare Other | Attending: Medical

## 2023-03-18 DIAGNOSIS — R600 Localized edema: Secondary | ICD-10-CM | POA: Diagnosis present

## 2023-03-18 DIAGNOSIS — R0609 Other forms of dyspnea: Secondary | ICD-10-CM | POA: Insufficient documentation

## 2023-03-18 LAB — ECHOCARDIOGRAM COMPLETE
AR max vel: 4.31 cm2
AV Area VTI: 4.24 cm2
AV Area mean vel: 4.4 cm2
AV Mean grad: 4 mmHg
AV Peak grad: 7.4 mmHg
Ao pk vel: 1.36 m/s
Area-P 1/2: 2.91 cm2
S' Lateral: 3.3 cm

## 2023-03-18 MED ORDER — PERFLUTREN LIPID MICROSPHERE
1.0000 mL | INTRAVENOUS | Status: AC | PRN
  Administered 2023-03-18: 2 mL via INTRAVENOUS

## 2023-03-25 ENCOUNTER — Encounter: Payer: Self-pay | Admitting: Medical

## 2023-03-25 ENCOUNTER — Telehealth: Payer: Self-pay | Admitting: Podiatry

## 2023-03-25 ENCOUNTER — Ambulatory Visit: Payer: Medicare Other | Attending: Medical | Admitting: Medical

## 2023-03-25 VITALS — BP 135/80 | HR 67 | Ht 70.0 in | Wt 275.2 lb

## 2023-03-25 DIAGNOSIS — E782 Mixed hyperlipidemia: Secondary | ICD-10-CM | POA: Diagnosis present

## 2023-03-25 DIAGNOSIS — I2510 Atherosclerotic heart disease of native coronary artery without angina pectoris: Secondary | ICD-10-CM | POA: Diagnosis present

## 2023-03-25 DIAGNOSIS — I10 Essential (primary) hypertension: Secondary | ICD-10-CM | POA: Insufficient documentation

## 2023-03-25 DIAGNOSIS — R600 Localized edema: Secondary | ICD-10-CM | POA: Diagnosis present

## 2023-03-25 NOTE — Patient Instructions (Signed)
Medication Instructions:  Your physician recommends that you continue on your current medications as directed. Please refer to the Current Medication list given to you today.  *If you need a refill on your cardiac medications before your next appointment, please call your pharmacy*   Lab Work:  No lab work ordered today.  If you have labs (blood work) drawn today and your tests are completely normal, you will receive your results only by: MyChart Message (if you have MyChart) OR A paper copy in the mail If you have any lab test that is abnormal or we need to change your treatment, we will call you to review the results.   Testing/Procedures:  No testing ordered today.   Follow-Up: At [facility], you and your health needs are our priority.  As part of our continuing mission to provide you with exceptional heart care, we have created designated Provider Care Teams.  These Care Teams include your primary Cardiologist (physician) and Advanced Practice Providers (APPs -  Physician Assistants and Nurse Practitioners) who all work together to provide you with the care you need, when you need it.  We recommend signing up for the patient portal called "MyChart".  Sign up information is provided on this After Visit Summary.  MyChart is used to connect with patients for Virtual Visits (Telemedicine).  Patients are able to view lab/test results, encounter notes, upcoming appointments, etc.  Non-urgent messages can be sent to your provider as well.   To learn more about what you can do with MyChart, go to [email or web address].    Your next appointment:   3 month(s)  Provider:   Julien Nordmann, MD

## 2023-03-25 NOTE — Telephone Encounter (Signed)
Pt called and stated she has been crying all weekend since Friday in pain for what she thinks maybe gout. She was stating she needed a shot.  I explained that [place] office is closed this week but I could see if I could get her into [place] office.  She has an appt with vascular today and will discuss with them.She then stated it could be a blood clot as she has had it before. She also has an appt tomorrow at another office. She is going to talk with the vascular today and see what they say and if needed she will call us back to schedule an appt.

## 2023-03-25 NOTE — Progress Notes (Signed)
Cardiology Office Note:    Date:  03/25/2023   ID:  Carrie Larsen [place], [place] March 07, 1946, MRN [id number]  PCP:  Smitty Cords, DO  [facility] Cardiologist:  None  [facility] Electrophysiologist:  None   Referring MD: Kara Dies, NP   Chief Complaint: 3 month follow-up  History of Present Illness:    Carrie Larsen is a 77 y.o. female with a hx of CAD PCI to OM 2018, HLD, colon cancer, s/p Left nephrectomy who presents for 3 month follow-up.    H/o CAD with NSTEMI in 06/2017 treated with PCI to OM in 06/2017 with occluded RCA with collaterals from L>R. Echo at that time showed LVEF 60-65%, no WMA, 1DD.    Patient was last seen September 2023 reporting a mechanical fall.  Since that time she has been using a walker.  Patient reported cough, chest tightness, orthopnea, and lower leg edema.  She called the office and was instructed to take Lasix 40 mg once.  Reported improved symptoms.  PCP had recently started losartan/HCTZ.  Blood pressure was mildly elevated and Coreg was increased to 25 mg twice daily.  Echo was ordered.   Patient was diagnosed with a right sided DVT in 08/2022 in the ER and started on Eliquis and told to follow-up with VS. She saw vascular and underwent extensive intervention by vascular surgery with mechanical thrombectomy to the right superficial femoral vein, common femoral vein and external and common iliac veins, PTA right external and common iliac veins, stent placement of the right common and external iliac veins. She saw VS on 11/23/22 and still had some swelling and tenderness.   Last seen 11/2022 and reported right leg clot and STAT DVT study was ordered. This was negative for clot. Echo was ordered for DOE.   Echo showed LVEF 60-65%, G1DD.  Today, the patient is overall doing well. BP is better with increased dose of Coreg. She denies chest pain or shortness of breath. No lower leg edema, orthopnea or pnd. Echo was reviewed.   Past Medical  History:  Diagnosis Date   Anxiety    Arthritis    osteoarthritis   CAD (coronary artery disease)    a. 2012 ETT: no ischemia;  b. 06/2017 NSTEMI/PCI: LM nl, LAD nl, D1 70-80p, LCX nl, OM1 90 (3.0 x 23 Xience Alpine), RCA dominant, 100p/m, fills via L->R collats, EF 55-65%.   Cancer [facility])    a. s/p partial left nephrectomy.   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 colon    Colon polyp 2011   COVID-19 07/2019   Diastolic dysfunction    a. 08/2015 Echo: EF 60-65%, no rwma, Gr1 DD, midlly dil LA, PASP . No significant valvular dzs; b. 06/2017 Echo: EF 60-65%, Gr1DD, mildly dil LA.   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   Obesity    Prediabetes    Pseudophakia of both eyes    Retinal cyst     Past Surgical History:  Procedure Laterality Date   ABDOMINAL HYSTERECTOMY  APPENDECTOMY  06/07/2015   CARDIAC CATHETERIZATION     CATARACT EXTRACTION W/PHACO Left 04/24/2016   Procedure: CATARACT EXTRACTION PHACO AND INTRAOCULAR LENS PLACEMENT (IOC);  Surgeon: Galen Manila, MD;  Location: [facility] ORS;  Service: Ophthalmology;  Laterality: Left;  [place] 45.8 AP% 16.4 CDE 7.53 FLUID PACK LOT # [id number]   CATARACT EXTRACTION W/PHACO Right 06/05/2016   Procedure: CATARACT EXTRACTION PHACO AND INTRAOCULAR LENS PLACEMENT (IOC);  Surgeon: Galen Manila, MD;  Location: [facility] ORS;  Service: Ophthalmology;  Laterality: Right;  [place] 25.9 AP% 21.9 CDE 5.68 Fluid pack lot # [id number]   COLON RESECTION  03/04/1991   COLON SURGERY     COLONOSCOPY  03-14-10   Dr Lemar Livings, tubular adenoma at 25 cm.   COLONOSCOPY WITH  PROPOFOL N/A 06/07/2015   Procedure: COLONOSCOPY WITH PROPOFOL;  Surgeon: Earline Mayotte, MD;  Location: [facility] ENDOSCOPY;  Service: Endoscopy;  Laterality: N/A;   COLONOSCOPY WITH PROPOFOL N/A 08/10/2020   Procedure: COLONOSCOPY WITH PROPOFOL;  Surgeon: Earline Mayotte, MD;  Location: [facility] ENDOSCOPY;  Service: Endoscopy;  Laterality: N/A;   CORONARY STENT INTERVENTION N/A 06/28/2017   Procedure: CORONARY STENT INTERVENTION;  Surgeon: Alwyn Pea, MD;  Location: [facility] INVASIVE CV LAB;  Service: Cardiovascular;  Laterality: N/A;   EYE SURGERY     lens eye surgery   HERNIA REPAIR  1993   umbilical   LEFT HEART CATH AND CORONARY ANGIOGRAPHY N/A 06/28/2017   Procedure: LEFT HEART CATH AND CORONARY ANGIOGRAPHY;  Surgeon: Antonieta Iba, MD;  Location: [facility] INVASIVE CV LAB;  Service: Cardiovascular;  Laterality: N/A;   LUMBAR LAMINECTOMY/DECOMPRESSION MICRODISCECTOMY Bilateral 03/10/2018   Procedure: Laminectomy and Foraminotomy - Lumbar three-Lumbar four - Lumbar four-Lumbar five - bilateral;  Surgeon: Julio Sicks, MD;  Location: [facility] OR;  Service: Neurosurgery;  Laterality: Bilateral;   NEPHRECTOMY  1992   left- cancer   PERIPHERAL VASCULAR THROMBECTOMY Right 10/01/2022   Procedure: PERIPHERAL VASCULAR THROMBECTOMY;  Surgeon: Annice Needy, MD;  Location: [facility] INVASIVE CV LAB;  Service: Cardiovascular;  Laterality: Right;   RIB RESECTION     removal 4 ribs   TONSILLECTOMY      Current Medications: Current Meds  Medication Sig   allopurinol (ZYLOPRIM) 100 MG tablet Take 2 tablets (200 mg total) by mouth daily.   apixaban (ELIQUIS) 5 MG TABS tablet Take 1 tablet (5 mg total) by mouth daily at 6 (six) AM.   atorvastatin (LIPITOR) 80 MG tablet Take 1 tablet by mouth once daily   carvedilol (COREG) 25 MG tablet Take 1 tablet by mouth twice daily   colchicine 0.6 MG tablet Take 1 tablet (0.6 mg total) by mouth daily. Take 1 tablet TID until GI upset or flare subsides, then for maintenance, take  1 tablet daily. (Patient taking differently: Take 0.6 mg by mouth daily. Take 1 tablet TID until GI upset or flare subsides, then for maintenance, take 1 tablet daily. Every other day.)   esomeprazole (NEXIUM) 40 MG capsule Take 40 mg by mouth daily in the afternoon.   furosemide (LASIX) 20 MG tablet Take 1-2 tablets (20-40 mg total) by mouth daily as needed.   ibuprofen (ADVIL) 100 MG chewable tablet Chew 200 mg by mouth every 8 (eight) hours as needed. Patient states MD told her she can took three to four advil a day for pain up until today.   isosorbide mononitrate (IMDUR) 30 MG 24 hr tablet TAKE 1 TABLET BY MOUTH IN THE MORNING AND AT BEDTIME   lidocaine (LIDODERM)  5 % Place 1 patch onto the skin as needed. Remove & Discard patch within 12 hours or as directed by MD   Multiple Vitamins-Minerals (MULTIVITAMIN WITH MINERALS) tablet Take 1 tablet by mouth daily.   nitroGLYCERIN (NITROSTAT) 0.4 MG SL tablet Place 1 tablet (0.4 mg total) under the tongue every 5 (five) minutes x 3 doses as needed for chest pain.   traMADol (ULTRAM) 50 MG tablet Take 1 tablet (50 mg total) by mouth every 4 (four) hours as needed.   Vibegron (GEMTESA) 75 MG TABS Take 75 mg by mouth daily.     Allergies:   Morphine and related, Prednisone, Amlodipine, and Latex   Social History   Socioeconomic History   Marital status: Single    Spouse name: Not on file   Number of children: Not on file   Years of education: Not on file   Highest education level: Not on file  Occupational History   Occupation: Control and instrumentation engineer: [employer]  Tobacco Use   Smoking status: Never    Passive exposure: Never   Smokeless tobacco: Never  Vaping Use   Vaping Use: Never used  Substance and Sexual Activity   Alcohol use: No   Drug use: No   Sexual activity: Not on file  Other Topics Concern   Not on file  Social History Narrative   Not on file   Social Determinants of Health   Financial Resource Strain: Not on  file  Food Insecurity: Not on file  Transportation Needs: Not on file  Physical Activity: Not on file  Stress: Not on file  Social Connections: Not on file     Family History: The patient's family history includes Cerebral aneurysm in her father; Colon cancer in her mother; Healthy in her son; Heart attack in her father; Heart attack (age of onset: 78) in her brother.  ROS:   Please see the history of present illness.     All other systems reviewed and are negative.  EKGs/Labs/Other Studies Reviewed:    The following studies were reviewed today:  Echo 03/18/23 1. Left ventricular ejection fraction, by estimation, is 60 to 65%. Left  ventricular ejection fraction by PLAX is 64 %. The left ventricle has  normal function. The left ventricle has no regional wall motion  abnormalities. Left ventricular diastolic  parameters are consistent with Grade I diastolic dysfunction (impaired  relaxation).   2. Right ventricular systolic function is normal. The right ventricular  size is normal.   3. The mitral valve is normal in structure. No evidence of mitral valve  regurgitation.   4. The aortic valve was not well visualized. Aortic valve regurgitation  is not visualized.   5. The inferior vena cava is normal in size with greater than 50%  respiratory variability, suggesting right atrial pressure of 3 mmHg.   Cardiac cath 2018 A STENT XIENCE ALPINE RX 3.0X23 drug eluting stent was successfully placed, and does not overlap previously placed stent. 1st Mrg lesion, 75 %stenosed. Post intervention, there is a 0% residual stenosis.   Conclusions Successful PCI and stent of OM1 lesion 75% down to 0% with a DES   Echo 2018 - Left ventricle: The cavity size was normal. Systolic function was    normal. The estimated ejection fraction was in the range of 60%    to 65%. Wall motion was normal; there were no regional wall    motion abnormalities. Doppler parameters are consistent with     abnormal  left ventricular relaxation (grade 1 diastolic    dysfunction).  - Left atrium: The atrium was mildly dilated.  - Right ventricle: Systolic function was normal.  - Pulmonary arteries: Systolic pressure was within the normal    range.     EKG:  EKG is not ordered today.    Recent Labs: 08/09/2022: TSH 2.34 08/23/2022: ALT 23 02/01/2023: BUN 14; Creatinine, Ser 0.85; Hemoglobin 13.6; Platelets 213; Potassium 4.1; Sodium 139  Recent Lipid Panel    Component Value Date/Time   CHOL 191 08/09/2022 1034   CHOL 169 06/26/2018 0823   TRIG 114 08/09/2022 1034   HDL 53 08/09/2022 1034   HDL 51 06/26/2018 0823   CHOLHDL 3.6 08/09/2022 1034   VLDL 27 06/25/2019 1018   LDLCALC 116 (H) 08/09/2022 1034    Physical Exam:    VS:  BP 135/80 (BP Location: Left Arm, Patient Position: Sitting, Cuff Size: Normal)   Pulse 67   Ht 5\' 10"  (1.778 m)   Wt 275 lb 3.2 oz (124.8 kg)   SpO2 98%   BMI 39.49 kg/m     Wt Readings from Last 3 Encounters:  03/25/23 275 lb 3.2 oz (124.8 kg)  02/27/23 272 lb (123.4 kg)  02/05/23 270 lb (122.5 kg)     GEN:  Well nourished, well developed in no acute distress HEENT: Normal NECK: No JVD; No carotid bruits LYMPHATICS: No lymphadenopathy CARDIAC: RRR, no murmurs, rubs, gallops RESPIRATORY:  Clear to auscultation without rales, wheezing or rhonchi  ABDOMEN: Soft, non-tender, non-distended MUSCULOSKELETAL:  No edema; No deformity  SKIN: Warm and dry NEUROLOGIC:  Alert and oriented x 3 PSYCHIATRIC:  Normal affect   ASSESSMENT:    1. Coronary artery disease involving native coronary artery of native heart without angina pectoris   2. Hyperlipidemia, mixed   3. Essential hypertension   4. Lower extremity edema    PLAN:    In order of problems listed above:  CAD s/p PCI OM1 The patient denies anginal symptoms. Last cath was in 2018 (report above). Continue Aspirin, Imdur,  Lipitor, SL NTG and Coreg. No further ischemic work-up indicated. Recent  echo showed normal pump function, G1DD.   HLD LDL 116. Continue Lipitor 80mg  daily.   HTN BP is reasonable today. Continue Coreg 25mg  BID and Imdur 30mg  BID.   Right lower leg DVT She is on Eliquis 5mg  BID.   Disposition: Follow up in 3 month(s) with Md    Signed, Christoher Drudge Ardelle Lesches  03/25/2023 3:09 PM    [facility]

## 2023-03-26 ENCOUNTER — Ambulatory Visit (INDEPENDENT_AMBULATORY_CARE_PROVIDER_SITE_OTHER): Payer: Medicare Other | Admitting: Vascular Surgery

## 2023-03-26 ENCOUNTER — Ambulatory Visit (INDEPENDENT_AMBULATORY_CARE_PROVIDER_SITE_OTHER): Payer: Medicare Other

## 2023-03-26 VITALS — BP 125/75 | HR 68 | Resp 17 | Ht 70.0 in | Wt 263.0 lb

## 2023-03-26 DIAGNOSIS — M109 Gout, unspecified: Secondary | ICD-10-CM

## 2023-03-26 DIAGNOSIS — I82421 Acute embolism and thrombosis of right iliac vein: Secondary | ICD-10-CM | POA: Diagnosis not present

## 2023-03-26 DIAGNOSIS — E782 Mixed hyperlipidemia: Secondary | ICD-10-CM

## 2023-03-26 DIAGNOSIS — M79604 Pain in right leg: Secondary | ICD-10-CM

## 2023-03-26 DIAGNOSIS — M79605 Pain in left leg: Secondary | ICD-10-CM

## 2023-03-26 DIAGNOSIS — I10 Essential (primary) hypertension: Secondary | ICD-10-CM | POA: Diagnosis not present

## 2023-03-26 DIAGNOSIS — R0989 Other specified symptoms and signs involving the circulatory and respiratory systems: Secondary | ICD-10-CM

## 2023-03-26 DIAGNOSIS — I82511 Chronic embolism and thrombosis of right femoral vein: Secondary | ICD-10-CM

## 2023-03-26 DIAGNOSIS — M79609 Pain in unspecified limb: Secondary | ICD-10-CM | POA: Insufficient documentation

## 2023-03-26 MED ORDER — GABAPENTIN 300 MG PO CAPS: 300.0000 mg | ORAL_CAPSULE | Freq: Every day | ORAL | 0 refills | Status: DC

## 2023-03-26 NOTE — Assessment & Plan Note (Signed)
Her arterial studies were normal with an ABI of 1.07 on the right and 1.16 on the left with multiphasic waveforms and normal digital pressures bilaterally.  Her venous study showed no evidence of DVT throughout the right lower extremity and the common femoral vein was also interrogated on the left it was normal.  Marked improvement after thrombectomy of the right lower extremity as well as venous stent placement to the right iliac vein.  Continue full dose anticoagulation and follow-up in 6 months to discuss potential reduction in the dose of her anticoagulants.

## 2023-03-26 NOTE — Progress Notes (Signed)
MRN : [id number]  Carrie Larsen is a 77 y.o. (05/15/1946) female who presents with chief complaint of  Chief Complaint  Patient presents with   Follow-up  .  History of Present Illness: Patient returns today in follow up of leg pain and previous history of DVT.  She has undergone extensive right lower extremity thrombectomy including right iliac vein stent placement in the past.  She remains on Eliquis 5 mg twice daily and is tolerating that well.  Her leg swelling is much improved.  She overall is doing fairly well but does still have a lot of pain in her legs.  Today, we checked both arterial and venous studies.  Her arterial studies were normal with an ABI of 1.07 on the right and 1.16 on the left with multiphasic waveforms and normal digital pressures bilaterally.  Her venous study showed no evidence of DVT throughout the right lower extremity and the common femoral vein was also interrogated on the left it was normal.  Current Outpatient Medications  Medication Sig Dispense Refill   allopurinol (ZYLOPRIM) 100 MG tablet Take 2 tablets (200 mg total) by mouth daily. 180 tablet 1   apixaban (ELIQUIS) 5 MG TABS tablet Take 1 tablet (5 mg total) by mouth daily at 6 (six) AM. 90 tablet 3   atorvastatin (LIPITOR) 80 MG tablet Take 1 tablet by mouth once daily 90 tablet 0   carvedilol (COREG) 25 MG tablet Take 1 tablet by mouth twice daily 180 tablet 0   colchicine 0.6 MG tablet Take 1 tablet (0.6 mg total) by mouth daily. Take 1 tablet TID until GI upset or flare subsides, then for maintenance, take 1 tablet daily. (Patient taking differently: Take 0.6 mg by mouth daily. Take 1 tablet TID until GI upset or flare subsides, then for maintenance, take 1 tablet daily. Every other day.) 90 tablet 3   esomeprazole (NEXIUM) 40 MG capsule Take 40 mg by mouth daily in the afternoon.     furosemide (LASIX) 20 MG tablet Take 1-2 tablets (20-40 mg total) by mouth daily as needed. 30 tablet 2   gabapentin  (NEURONTIN) 300 MG capsule Take 1 capsule (300 mg total) by mouth at bedtime. 30 capsule 0   ibuprofen (ADVIL) 100 MG chewable tablet Chew 200 mg by mouth every 8 (eight) hours as needed. Patient states MD told her she can took three to four advil a day for pain up until today.     isosorbide mononitrate (IMDUR) 30 MG 24 hr tablet TAKE 1 TABLET BY MOUTH IN THE MORNING AND AT BEDTIME 180 tablet 0   lidocaine (LIDODERM) 5 % Place 1 patch onto the skin as needed. Remove & Discard patch within 12 hours or as directed by MD     Multiple Vitamins-Minerals (MULTIVITAMIN WITH MINERALS) tablet Take 1 tablet by mouth daily.     nitroGLYCERIN (NITROSTAT) 0.4 MG SL tablet Place 1 tablet (0.4 mg total) under the tongue every 5 (five) minutes x 3 doses as needed for chest pain. 25 tablet 1   traMADol (ULTRAM) 50 MG tablet Take 1 tablet (50 mg total) by mouth every 4 (four) hours as needed. 30 tablet 2   Vibegron (GEMTESA) 75 MG TABS Take 75 mg by mouth daily. 28 tablet 0   Homeopathic Products (COLCIGEL) GEL APPLY TO THE AFFECTED AREA 1-2 TIMES DAILY (Patient not taking: Reported on 03/25/2023) 120 mL 3   No current facility-administered medications for this visit.    Past Medical  History:  Diagnosis Date   Anxiety    Arthritis    osteoarthritis   CAD (coronary artery disease)    a. 2012 ETT: no ischemia;  b. 06/2017 NSTEMI/PCI: LM nl, LAD nl, D1 70-80p, LCX nl, OM1 90 (3.0 x 23 Xience [place]), RCA dominant, 100p/m, fills via L->R collats, EF 55-65%.   Cancer [facility])    a. s/p partial left nephrectomy.   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 colon    Colon polyp 2011   COVID-19 07/2019   Diastolic dysfunction    a. 08/2015 Echo: EF 60-65%, no rwma, Gr1 DD, midlly dil LA, PASP . No significant valvular dzs; b. 06/2017 Echo: EF 60-65%, Gr1DD, mildly dil LA.   Essential hypertension    GERD (gastroesophageal reflux disease)    Gout     History of appendectomy 06/07/2015   History of kidney cancer 11/20/2018   History of kidney stones    passed - 2   Hyperlipidemia    Lumbar spinal stenosis    Myocardial infarction (HCC)    06/2017   Nonexudative age-related macular degeneration, bilateral, early dry stage 11/20/2018   Obesity    Prediabetes    Pseudophakia of both eyes    Retinal cyst     Past Surgical History:  Procedure Laterality Date   ABDOMINAL HYSTERECTOMY     APPENDECTOMY  06/07/2015   CARDIAC CATHETERIZATION     CATARACT EXTRACTION W/PHACO Left 04/24/2016   Procedure: CATARACT EXTRACTION PHACO AND INTRAOCULAR LENS PLACEMENT (IOC);  Surgeon: Galen Manila, MD;  Location: [facility] ORS;  Service: Ophthalmology;  Laterality: Left;  [place] 45.8 AP% 16.4 CDE 7.53 FLUID PACK LOT # [id number]   CATARACT EXTRACTION W/PHACO Right 06/05/2016   Procedure: CATARACT EXTRACTION PHACO AND INTRAOCULAR LENS PLACEMENT (IOC);  Surgeon: Galen Manila, MD;  Location: [facility] ORS;  Service: Ophthalmology;  Laterality: Right;  [place] 25.9 AP% 21.9 CDE 5.68 Fluid pack lot # [id number]   COLON RESECTION  03/04/1991   COLON SURGERY     COLONOSCOPY  03-14-10   Dr Lemar Livings, tubular adenoma at 25 cm.   COLONOSCOPY WITH PROPOFOL N/A 06/07/2015   Procedure: COLONOSCOPY WITH PROPOFOL;  Surgeon: Earline Mayotte, MD;  Location: [facility] ENDOSCOPY;  Service: Endoscopy;  Laterality: N/A;   COLONOSCOPY WITH PROPOFOL N/A 08/10/2020   Procedure: COLONOSCOPY WITH PROPOFOL;  Surgeon: Earline Mayotte, MD;  Location: [facility] ENDOSCOPY;  Service: Endoscopy;  Laterality: N/A;   CORONARY STENT INTERVENTION N/A 06/28/2017   Procedure: CORONARY STENT INTERVENTION;  Surgeon: Alwyn Pea, MD;  Location: [facility] INVASIVE CV LAB;  Service: Cardiovascular;  Laterality: N/A;   EYE SURGERY     lens eye surgery   HERNIA REPAIR  1993   umbilical   LEFT HEART CATH AND CORONARY ANGIOGRAPHY N/A 06/28/2017   Procedure: LEFT HEART CATH AND CORONARY ANGIOGRAPHY;  Surgeon: Antonieta Iba, MD;  Location: [facility] INVASIVE CV LAB;  Service: Cardiovascular;  Laterality: N/A;   LUMBAR LAMINECTOMY/DECOMPRESSION MICRODISCECTOMY Bilateral 03/10/2018   Procedure: Laminectomy and Foraminotomy - Lumbar three-Lumbar four - Lumbar four-Lumbar five - bilateral;  Surgeon: Julio Sicks, MD;  Location: [facility] OR;  Service: Neurosurgery;  Laterality: Bilateral;   NEPHRECTOMY  1992   left- cancer   PERIPHERAL VASCULAR THROMBECTOMY Right 10/01/2022   Procedure: PERIPHERAL VASCULAR THROMBECTOMY;  Surgeon: Annice Needy, MD;  Location: [facility] INVASIVE CV LAB;  Service: Cardiovascular;  Laterality: Right;  RIB RESECTION     removal 4 ribs   TONSILLECTOMY       Social History   Tobacco Use   Smoking status: Never    Passive exposure: Never   Smokeless tobacco: Never  Vaping Use   Vaping Use: Never used  Substance Use Topics   Alcohol use: No   Drug use: No       Family History  Problem Relation Age of Onset   Colon cancer Mother    Cerebral aneurysm Father    Heart attack Father    Heart attack Brother 10   Healthy Son      Allergies  Allergen Reactions   Morphine And Related Nausea Only   Prednisone Nausea And Vomiting   Amlodipine Palpitations   Latex Rash     REVIEW OF SYSTEMS (Negative unless checked)  Constitutional: [] Weight loss  [] Fever  [] Chills Cardiac: [] Chest pain   [] Chest pressure   [] Palpitations   [] Shortness of breath when laying flat   [] Shortness of breath at rest   [] Shortness of breath with exertion. Vascular:  [x] Pain in legs with walking   [x] Pain in legs at rest   [] Pain in legs when laying flat   [] Claudication   [] Pain in feet when walking  [] Pain in feet at rest  [] Pain in feet when laying flat   [x] History of DVT   [] Phlebitis   [x] Swelling in legs   [x] Varicose veins   [] Non-healing ulcers Pulmonary:   [] Uses home oxygen   [] Productive cough   [] Hemoptysis   [] Wheeze  [] COPD   [] Asthma Neurologic:  [] Dizziness  [] Blackouts   [] Seizures    [] History of stroke   [] History of TIA  [] Aphasia   [] Temporary blindness   [] Dysphagia   [] Weakness or numbness in arms   [] Weakness or numbness in legs Musculoskeletal:  [x] Arthritis   [] Joint swelling   [x] Joint pain   [] Low back pain Hematologic:  [] Easy bruising  [] Easy bleeding   [] Hypercoagulable state   [] Anemic   Gastrointestinal:  [] Blood in stool   [] Vomiting blood  [] Gastroesophageal reflux/heartburn   [] Abdominal pain Genitourinary:  [] Chronic kidney disease   [] Difficult urination  [] Frequent urination  [] Burning with urination   [] Hematuria Skin:  [] Rashes   [] Ulcers   [] Wounds Psychological:  [] History of anxiety   []  History of major depression.  Physical Examination  BP 125/75 (BP Location: Right Arm)   Pulse 68   Resp 17   Ht 5\' 10"  (1.778 m)   Wt 263 lb (119.3 kg)   BMI 37.74 kg/m  Gen:  WD/WN, NAD Head: [place]/AT, No temporalis wasting. Ear/Nose/Throat: Hearing grossly intact, nares w/o erythema or drainage Eyes: Conjunctiva clear. Sclera non-icteric Neck: Supple.  Trachea midline Pulmonary:  Good air movement, no use of accessory muscles.  Cardiac: RRR, no JVD Vascular:  Vessel Right Left  Radial Palpable Palpable                          PT Palpable Palpable  DP Palpable Palpable   Gastrointestinal: soft, non-tender/non-distended. No guarding/reflex.  Musculoskeletal: M/S 5/5 throughout.  No deformity or atrophy. Trace BLE edema. Neurologic: Sensation grossly intact in extremities.  Symmetrical.  Speech is fluent.  Psychiatric: Judgment intact, Mood & affect appropriate for pt's clinical situation. Dermatologic: No rashes or ulcers noted.  No cellulitis or open wounds.      Labs Recent Results (from the past 2160 hour(s))  CBC with Differential  Status: None   Collection Time: 02/01/23  5:56 PM  Result Value Ref Range   WBC 6.4 4.0 - 10.5 K/uL   RBC 4.61 3.87 - 5.11 MIL/uL   Hemoglobin 13.6 12.0 - 15.0 g/dL   HCT 78.2 95.6 - 21.3 %   MCV  91.1 80.0 - 100.0 fL   MCH 29.5 26.0 - 34.0 pg   MCHC 32.4 30.0 - 36.0 g/dL   RDW 08.6 57.8 - 46.9 %   Platelets 213 150 - 400 K/uL   nRBC 0.0 0.0 - 0.2 %   Neutrophils Relative % 56 %   Neutro Abs 3.6 1.7 - 7.7 K/uL   Lymphocytes Relative 31 %   Lymphs Abs 2.0 0.7 - 4.0 K/uL   Monocytes Relative 9 %   Monocytes Absolute 0.6 0.1 - 1.0 K/uL   Eosinophils Relative 2 %   Eosinophils Absolute 0.1 0.0 - 0.5 K/uL   Basophils Relative 1 %   Basophils Absolute 0.0 0.0 - 0.1 K/uL   Immature Granulocytes 1 %   Abs Immature Granulocytes 0.03 0.00 - 0.07 K/uL    Comment: Performed at [facility], [address], [place], [place] [address]  Basic metabolic panel     Status: Abnormal   Collection Time: 02/01/23  5:56 PM  Result Value Ref Range   Sodium 139 135 - 145 mmol/L   Potassium 4.1 3.5 - 5.1 mmol/L   Chloride 109 98 - 111 mmol/L   CO2 24 22 - 32 mmol/L   Glucose, Bld 138 (H) 70 - 99 mg/dL    Comment: Glucose reference range applies only to samples taken after fasting for at least 8 hours.   BUN 14 8 - 23 mg/dL   Creatinine, Ser 8.41 0.44 - 1.00 mg/dL   Calcium 9.2 8.9 - 32.4 mg/dL   GFR, Estimated >40 >10 mL/min    Comment: (NOTE) Calculated using the CKD-EPI Creatinine Equation (2021)    Anion gap 6 5 - 15    Comment: Performed at [facility], [address] [place], [place] [address]  Lactic acid, plasma     Status: None   Collection Time: 02/01/23  6:33 PM  Result Value Ref Range   Lactic Acid, Venous 1.5 0.5 - 1.9 mmol/L    Comment: Performed at [facility], [address] [place], [place] [address]  POC COVID-19     Status: None   Collection Time: 02/27/23  2:33 PM  Result Value Ref Range   SARS Coronavirus 2 Ag Negative Negative  ECHOCARDIOGRAM COMPLETE     Status: None   Collection Time: 03/18/23  3:42 PM  Result Value Ref Range   AR max vel 4.31 cm2   AV Peak grad 7.4 mmHg   Ao pk vel 1.36 m/s   S' Lateral 3.30 cm   Area-P 1/2  2.91 cm2   AV Area VTI 4.24 cm2   AV Mean grad 4.0 mmHg   AV Area mean vel 4.40 cm2   Est EF 60 - 65%     Radiology ECHOCARDIOGRAM COMPLETE  Result Date: 03/18/2023    ECHOCARDIOGRAM REPORT   Patient Name:   [facility] Date of Exam: 03/18/2023 Medical Rec #:  [id number]      Height:       70.0 in Accession #:    [id number]     Weight:       272.0 lb Date of Birth:  1945-12-11      BSA:  2.379 m Patient Age:    77 years       BP:           150/90 mmHg Patient Gender: F              HR:           80 bpm. Exam Location:  [place] Procedure: 2D Echo, Cardiac Doppler, Color Doppler and Intracardiac            Opacification Agent Indications:    R06.02 SOB; R01.1 Murmur  History:        Patient has prior history of Echocardiogram examinations, most                 recent 06/28/2017. CAD, Signs/Symptoms:Shortness of Breath, Murmur                 and Edema; Risk Factors:Dyslipidemia, Hypertension and                 Non-Smoker.  Sonographer:    Quentin Ore RDMS, RVT, RDCS Referring Phys: [id number] CADENCE H FURTH IMPRESSIONS  1. Left ventricular ejection fraction, by estimation, is 60 to 65%. Left ventricular ejection fraction by PLAX is 64 %. The left ventricle has normal function. The left ventricle has no regional wall motion abnormalities. Left ventricular diastolic parameters are consistent with Grade I diastolic dysfunction (impaired relaxation).  2. Right ventricular systolic function is normal. The right ventricular size is normal.  3. The mitral valve is normal in structure. No evidence of mitral valve regurgitation.  4. The aortic valve was not well visualized. Aortic valve regurgitation is not visualized.  5. The inferior vena cava is normal in size with greater than 50% respiratory variability, suggesting right atrial pressure of 3 mmHg. FINDINGS  Left Ventricle: Left ventricular ejection fraction, by estimation, is 60 to 65%. Left ventricular ejection fraction by PLAX is 64 %. The left  ventricle has normal function. The left ventricle has no regional wall motion abnormalities. Definity contrast agent was given IV to delineate the left ventricular endocardial borders. The left ventricular internal cavity size was normal in size. There is no left ventricular hypertrophy. Left ventricular diastolic parameters are consistent with Grade I diastolic dysfunction (impaired relaxation). Right Ventricle: The right ventricular size is normal. No increase in right ventricular wall thickness. Right ventricular systolic function is normal. Left Atrium: Left atrial size was normal in size. Right Atrium: Right atrial size was normal in size. Pericardium: There is no evidence of pericardial effusion. Mitral Valve: The mitral valve is normal in structure. No evidence of mitral valve regurgitation. Tricuspid Valve: The tricuspid valve is normal in structure. Tricuspid valve regurgitation is not demonstrated. Aortic Valve: The aortic valve was not well visualized. Aortic valve regurgitation is not visualized. Aortic valve mean gradient measures 4.0 mmHg. Aortic valve peak gradient measures 7.4 mmHg. Aortic valve area, by VTI measures 4.24 cm. Pulmonic Valve: The pulmonic valve was normal in structure. Pulmonic valve regurgitation is not visualized. Aorta: The aortic root and ascending aorta are structurally normal, with no evidence of dilitation. Venous: The inferior vena cava is normal in size with greater than 50% respiratory variability, suggesting right atrial pressure of 3 mmHg. IAS/Shunts: No atrial level shunt detected by color flow Doppler.  LEFT VENTRICLE PLAX 2D LV EF:         Left            Diastology  ventricular     LV e' medial:    5.87 cm/s                ejection        LV E/e' medial:  14.6                fraction by     LV e' lateral:   5.77 cm/s                PLAX is 64      LV E/e' lateral: 14.9                %. LVIDd:         5.10 cm LVIDs:         3.30 cm LV PW:         0.80 cm  LV IVS:        0.80 cm LVOT diam:     2.30 cm LV SV:         126 LV SV Index:   53 LVOT Area:     4.15 cm  RIGHT VENTRICLE             IVC RV S prime:     18.50 cm/s  IVC diam: 2.10 cm TAPSE (M-mode): 2.6 cm LEFT ATRIUM             Index LA diam:        4.20 cm 1.77 cm/m LA Vol (A2C):   42.6 ml 17.91 ml/m LA Vol (A4C):   63.3 ml 26.61 ml/m LA Biplane Vol: 52.9 ml 22.24 ml/m  AORTIC VALVE                    PULMONIC VALVE AV Area (Vmax):    4.31 cm     PV Vmax:       1.16 m/s AV Area (Vmean):   4.40 cm     PV Peak grad:  5.4 mmHg AV Area (VTI):     4.24 cm AV Vmax:           136.00 cm/s AV Vmean:          92.200 cm/s AV VTI:            0.297 m AV Peak Grad:      7.4 mmHg AV Mean Grad:      4.0 mmHg LVOT Vmax:         141.00 cm/s LVOT Vmean:        97.700 cm/s LVOT VTI:          0.303 m LVOT/AV VTI ratio: 1.02  AORTA Ao Root diam: 3.10 cm Ao Asc diam:  3.70 cm MITRAL VALVE MV Area (PHT): 2.91 cm     SHUNTS MV Decel Time: 261 msec     Systemic VTI:  0.30 m MV E velocity: 85.90 cm/s   Systemic Diam: 2.30 cm MV A velocity: 103.00 cm/s MV E/A ratio:  0.83 Debbe Odea MD Electronically signed by Debbe Odea MD Signature Date/Time: 03/18/2023/4:23:21 PM    Final     Assessment/Plan  DVT (deep venous thrombosis) (HCC) Her arterial studies were normal with an ABI of 1.07 on the right and 1.16 on the left with multiphasic waveforms and normal digital pressures bilaterally.  Her venous study showed no evidence of DVT throughout the right lower extremity and the common femoral vein was also interrogated on the left it was normal.  Marked improvement after thrombectomy of  the right lower extremity as well as venous stent placement to the right iliac vein.  Continue full dose anticoagulation and follow-up in 6 months to discuss potential reduction in the dose of her anticoagulants.  HTN (hypertension) blood pressure control important in reducing the progression of atherosclerotic disease. On appropriate  oral medications.   Gout involving toe Significant amount of her pain at this point appears to be neuropathic as well as gout in her foot and ankle.  Hyperlipidemia lipid control important in reducing the progression of atherosclerotic disease. Continue statin therapy   Pain in limb Patient describes significant amount of pain in the lower extremities that sounds neuropathic in nature.  Given her normal arterial and venous studies today and the marked improvement in her leg swelling after venous intervention, think there is likely a component of neuropathy as well as potentially some arthritic changes like gout.  She asks about gabapentin and I will give her a low-dose of gabapentin today, and if this is helpful, this may be something her primary care provider can provide in the future.    Festus Barren, MD  03/26/2023 3:42 PM    This note was created with Dragon medical transcription system.  Any errors from dictation are purely unintentional

## 2023-03-26 NOTE — Assessment & Plan Note (Signed)
lipid control important in reducing the progression of atherosclerotic disease. Continue statin therapy  

## 2023-03-26 NOTE — Assessment & Plan Note (Signed)
blood pressure control important in reducing the progression of atherosclerotic disease. On appropriate oral medications.  

## 2023-03-26 NOTE — Assessment & Plan Note (Signed)
Significant amount of her pain at this point appears to be neuropathic as well as gout in her foot and ankle.

## 2023-03-26 NOTE — Assessment & Plan Note (Signed)
Patient describes significant amount of pain in the lower extremities that sounds neuropathic in nature.  Given her normal arterial and venous studies today and the marked improvement in her leg swelling after venous intervention, think there is likely a component of neuropathy as well as potentially some arthritic changes like gout.  She asks about gabapentin and I will give her a low-dose of gabapentin today, and if this is helpful, this may be something her primary care provider can provide in the future.

## 2023-03-28 LAB — VAS US ABI WITH/WO TBI
Left ABI: 1.16
Right ABI: 1.07

## 2023-04-01 ENCOUNTER — Ambulatory Visit: Payer: Medicare Other | Admitting: Urology

## 2023-04-03 ENCOUNTER — Ambulatory Visit: Payer: Medicare Other | Admitting: Podiatry

## 2023-04-08 ENCOUNTER — Ambulatory Visit: Payer: Medicare Other | Admitting: Urology

## 2023-04-09 ENCOUNTER — Ambulatory Visit (INDEPENDENT_AMBULATORY_CARE_PROVIDER_SITE_OTHER): Payer: Medicare Other | Admitting: Podiatry

## 2023-04-09 ENCOUNTER — Ambulatory Visit: Payer: Medicare Other | Admitting: Urology

## 2023-04-09 DIAGNOSIS — M7752 Other enthesopathy of left foot: Secondary | ICD-10-CM | POA: Diagnosis not present

## 2023-04-09 DIAGNOSIS — M7751 Other enthesopathy of right foot: Secondary | ICD-10-CM

## 2023-04-09 MED ORDER — OXYCODONE-ACETAMINOPHEN 5-325 MG PO TABS: 1.0000 | ORAL_TABLET | ORAL | 0 refills | Status: DC | PRN

## 2023-04-09 NOTE — Progress Notes (Signed)
Subjective:  Patient ID: Carrie Larsen, female    DOB: 09/10/1946,  MRN: [id number]  Chief Complaint  Patient presents with   Gout    77 y.o. female presents with the above complaint.  Patient presents with complaint of bilateral ankle pain.  Patient states painful to touch right side is worse than left side.  She wanted to get it evaluated she states injections in the past have helped.  She would like to do another injection.  She has history of gout.   Review of Systems: Negative except as noted in the HPI. Denies N/V/F/Ch.  Past Medical History:  Diagnosis Date   Anxiety    Arthritis    osteoarthritis   CAD (coronary artery disease)    a. 2012 ETT: no ischemia;  b. 06/2017 NSTEMI/PCI: LM nl, LAD nl, D1 70-80p, LCX nl, OM1 90 (3.0 x 23 Xience Alpine), RCA dominant, 100p/m, fills via L->R collats, EF 55-65%.   Cancer [facility])    a. s/p partial left nephrectomy.   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 colon    Colon polyp 2011   COVID-19 07/2019   Diastolic dysfunction    a. 08/2015 Echo: EF 60-65%, no rwma, Gr1 DD, midlly dil LA, PASP . No significant valvular dzs; b. 06/2017 Echo: EF 60-65%, Gr1DD, mildly dil LA.   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   Obesity    Prediabetes    Pseudophakia of both eyes    Retinal cyst     Current Outpatient Medications:    oxyCODONE-acetaminophen (PERCOCET) 5-325 MG tablet, Take 1 tablet by mouth every 4 (four) hours as needed for severe pain., Disp: 30 tablet, Rfl: 0   allopurinol (ZYLOPRIM) 100 MG tablet, Take 2 tablets (200 mg total) by mouth  daily., Disp: 180 tablet, Rfl: 1   apixaban (ELIQUIS) 2.5 MG TABS tablet, Take 1 tablet (2.5 mg total) by mouth 2 (two) times daily., Disp: 60 tablet, Rfl: 2   atorvastatin (LIPITOR) 80 MG tablet, Take 1 tablet by mouth once daily, Disp: 90 tablet, Rfl: 0   carvedilol (COREG) 25 MG tablet, Take 1 tablet by mouth twice daily, Disp: 180 tablet, Rfl: 0   colchicine 0.6 MG tablet, Take 1 tablet (0.6 mg total) by mouth daily. Take 1 tablet TID until GI upset or flare subsides, then for maintenance, take 1 tablet daily. (Patient taking differently: Take 0.6 mg by mouth daily. Take 1 tablet TID until GI upset or flare subsides, then for maintenance, take 1 tablet daily. Every other day.), Disp: 90 tablet, Rfl: 3   esomeprazole (NEXIUM) 40 MG capsule, Take 40 mg by mouth daily in the afternoon., Disp: , Rfl:    furosemide (LASIX) 20 MG tablet, Take 1-2 tablets (20-40 mg total) by mouth daily as needed., Disp: 30 tablet, Rfl: 2   Homeopathic Products (COLCIGEL) GEL, APPLY TO THE AFFECTED AREA 1-2 TIMES DAILY, Disp: 120 mL, Rfl: 3   ibuprofen (ADVIL) 100 MG chewable tablet, Chew 200 mg by mouth every 8 (eight) hours as needed. Patient  states MD told her she can took three to four advil a day for pain up until today., Disp: , Rfl:    isosorbide mononitrate (IMDUR) 30 MG 24 hr tablet, TAKE 1 TABLET BY MOUTH IN THE MORNING AND AT BEDTIME, Disp: 180 tablet, Rfl: 0   lidocaine (LIDODERM) 5 %, Place 1 patch onto the skin as needed. Remove & Discard patch within 12 hours or as directed by MD, Disp: , Rfl:    Multiple Vitamins-Minerals (MULTIVITAMIN WITH MINERALS) tablet, Take 1 tablet by mouth daily., Disp: , Rfl:    nitroGLYCERIN (NITROSTAT) 0.4 MG SL tablet, Place 1 tablet (0.4 mg total) under the tongue every 5 (five) minutes x 3 doses as needed for chest pain., Disp: 25 tablet, Rfl: 1   traMADol (ULTRAM) 50 MG tablet, Take 1 tablet (50 mg total) by mouth every 4 (four) hours as needed., Disp: 30 tablet, Rfl: 2    Vibegron (GEMTESA) 75 MG TABS, Take 75 mg by mouth daily., Disp: 28 tablet, Rfl: 0  Social History   Tobacco Use  Smoking Status Never   Passive exposure: Never  Smokeless Tobacco Never    Allergies  Allergen Reactions   Morphine And Codeine Nausea Only   Prednisone Nausea And Vomiting   Amlodipine Palpitations   Latex Rash   Objective:  There were no vitals filed for this visit. There is no height or weight on file to calculate BMI. Constitutional Well developed. Well nourished.  Vascular Dorsalis pedis pulses palpable bilaterally. Posterior tibial pulses palpable bilaterally. Capillary refill normal to all digits.  No cyanosis or clubbing noted. Pedal hair growth normal.  Neurologic Normal speech. Oriented to person, place, and time. Epicritic sensation to light touch grossly present bilaterally.  Dermatologic Nails well groomed and normal in appearance. No open wounds. No skin lesions.  Orthopedic: Pain on palpation bilateral ankle mild pain with range of motion of the ankle joint mild deep intra-articular pain noted bilaterally.  Right greater than left side.  No pain at the Achilles tendon peroneal tendon posterior tibial tendon   Radiographs: None Assessment:   1. Capsulitis of ankle, right   2. Capsulitis of left ankle    Plan:  Patient was evaluated and treated and all questions answered.  Bilateral ankle capsulitis -All questions and concerns were discussed with the patient in extensive detail given the amount of pain that she is having she will benefit from a steroid injection at decreasing inflammatory component associate with pain.  Patient agrees with plan like to proceed with steroid injection -A steroid injection was performed at bilateral ankle using 1% plain Lidocaine and 10 mg of Kenalog. This was well tolerated.   No follow-ups on file.

## 2023-04-11 ENCOUNTER — Ambulatory Visit: Payer: Medicare Other | Admitting: Podiatry

## 2023-04-11 ENCOUNTER — Other Ambulatory Visit (INDEPENDENT_AMBULATORY_CARE_PROVIDER_SITE_OTHER): Payer: Self-pay | Admitting: Nurse Practitioner

## 2023-04-11 ENCOUNTER — Telehealth (INDEPENDENT_AMBULATORY_CARE_PROVIDER_SITE_OTHER): Payer: Self-pay

## 2023-04-11 ENCOUNTER — Other Ambulatory Visit: Payer: Self-pay

## 2023-04-11 ENCOUNTER — Other Ambulatory Visit: Payer: Self-pay | Admitting: Cardiovascular Disease

## 2023-04-11 DIAGNOSIS — I10 Essential (primary) hypertension: Secondary | ICD-10-CM

## 2023-04-11 MED ORDER — APIXABAN 2.5 MG PO TABS: 2.5000 mg | ORAL_TABLET | Freq: Two times a day (BID) | ORAL | 2 refills | Status: DC

## 2023-04-11 NOTE — Telephone Encounter (Signed)
We can move her to 2.5 mg of eliquis as she has been on it for 6 months since having her DVT.  However, none of those issues she is having are going to be caused by eliquis.  If she continues to have issues, I would strongly recommend she contact her PCP and have him evaluate these issues. I have sent in 2.5 mg of eliquis to her pharmacy

## 2023-04-11 NOTE — Telephone Encounter (Signed)
last visit 03/25/2023 --3 month(s)  next visit 06/25/23

## 2023-04-11 NOTE — Telephone Encounter (Signed)
Left detailed VM for pt.

## 2023-04-15 ENCOUNTER — Encounter: Payer: Self-pay | Admitting: Family Medicine

## 2023-04-15 ENCOUNTER — Ambulatory Visit
Admission: RE | Admit: 2023-04-15 | Discharge: 2023-04-15 | Disposition: A | Discharge status: Home / Self Care | Payer: Medicare Other | Source: Ambulatory Visit | Attending: Family Medicine | Admitting: Family Medicine

## 2023-04-15 ENCOUNTER — Ambulatory Visit (INDEPENDENT_AMBULATORY_CARE_PROVIDER_SITE_OTHER): Payer: Medicare Other | Admitting: Family Medicine

## 2023-04-15 ENCOUNTER — Ambulatory Visit
Admission: RE | Admit: 2023-04-15 | Discharge: 2023-04-15 | Disposition: A | Discharge status: Home / Self Care | Payer: Medicare Other | Attending: Family Medicine | Admitting: Family Medicine

## 2023-04-15 VITALS — BP 139/75 | HR 62 | Ht 70.0 in | Wt 273.0 lb

## 2023-04-15 DIAGNOSIS — G8929 Other chronic pain: Secondary | ICD-10-CM | POA: Insufficient documentation

## 2023-04-15 DIAGNOSIS — M1712 Unilateral primary osteoarthritis, left knee: Secondary | ICD-10-CM

## 2023-04-15 DIAGNOSIS — G6289 Other specified polyneuropathies: Secondary | ICD-10-CM | POA: Diagnosis not present

## 2023-04-15 DIAGNOSIS — M109 Gout, unspecified: Secondary | ICD-10-CM

## 2023-04-15 DIAGNOSIS — M25562 Pain in left knee: Secondary | ICD-10-CM | POA: Diagnosis present

## 2023-04-15 DIAGNOSIS — R7309 Other abnormal glucose: Secondary | ICD-10-CM

## 2023-04-15 DIAGNOSIS — M79604 Pain in right leg: Secondary | ICD-10-CM

## 2023-04-15 DIAGNOSIS — E559 Vitamin D deficiency, unspecified: Secondary | ICD-10-CM

## 2023-04-15 DIAGNOSIS — M79605 Pain in left leg: Secondary | ICD-10-CM

## 2023-04-15 DIAGNOSIS — E538 Deficiency of other specified B group vitamins: Secondary | ICD-10-CM

## 2023-04-15 NOTE — Progress Notes (Signed)
Subjective:    Patient ID: Carrie Larsen, female    DOB: September 07, 1946, 77 y.o.   MRN: [id number]  Carrie Larsen is a 77 y.o. female presenting on 04/15/2023 for Medical Management of Chronic Issues and Knee Pain (Left knee pain, fell about a year ago)   HPI  Left Knee Pain / Lower Extremity Pain Chronic issue Significant fall traumatic injury, took 6 month to heal Describes persistent burning pain  Gout Pain Followed by Dr Allena Katz - [facility] On Allopurinol 200mg  daily and on Colchicine Last Uric Acid 5.6 (07/2022) Has no recent obvious gout flares or problems Prior with Rheumatology but lack of results. So has done well with Podiatry  Neuropathic Pain Chronic lower extremity, secondary issues with Gabapentin causing instability sedation. Seems her vascular specialist after evaluation of lower extremity circulation and DVT. They have advised her that it seems to be more neuropathic in nature. Has not seen Neurology  Improved on Tramadol 50mg  half tab twice a day as needed. But not taking regularly  Takes rare Oxycodone per Podiatry  History of RLE DVT Followed by Dr Wyn Quaker Vascular Vascular work up done, imaging done No further issues or complication     02/27/2023    2:32 PM 07/26/2022    2:42 PM  Depression screen PHQ 2/9  Decreased Interest 0 0  Down, Depressed, Hopeless 0 0  PHQ - 2 Score 0 0  Altered sleeping 0   Tired, decreased energy 0   Change in appetite 0   Feeling bad or failure about yourself  0   Trouble concentrating 0   Moving slowly or fidgety/restless 0   Suicidal thoughts 0   PHQ-9 Score 0   Difficult doing work/chores Not difficult at all     Social History   Tobacco Use   Smoking status: Never    Passive exposure: Never   Smokeless tobacco: Never  Vaping Use   Vaping Use: Never used  Substance Use Topics   Alcohol use: No   Drug use: No    Review of Systems Per HPI unless specifically indicated above     Objective:    BP  139/75 (BP Location: Left Arm) Comment: home reading  Pulse 62   Ht 5\' 10"  (1.778 m)   Wt 273 lb (123.8 kg)   SpO2 99%   BMI 39.17 kg/m   Wt Readings from Last 3 Encounters:  04/15/23 273 lb (123.8 kg)  03/26/23 263 lb (119.3 kg)  03/25/23 275 lb 3.2 oz (124.8 kg)    Physical Exam Vitals and nursing note reviewed.  Constitutional:      General: She is not in acute distress.    Appearance: Normal appearance. She is well-developed. She is not diaphoretic.     Comments: Well-appearing, comfortable, cooperative  HENT:     Head: Normocephalic and atraumatic.  Eyes:     General:        Right eye: No discharge.        Left eye: No discharge.     Conjunctiva/sclera: Conjunctivae normal.  Cardiovascular:     Rate and Rhythm: Normal rate.  Pulmonary:     Effort: Pulmonary effort is normal.  Musculoskeletal:     Right lower leg: Edema present.     Left lower leg: Edema present.     Comments: Bulky appearing L knee with some crepitus and some limitation on range of motion  Using rolling walker  Skin:    General: Skin is warm and  dry.     Findings: No erythema or rash.  Neurological:     Mental Status: She is alert and oriented to person, place, and time.  Psychiatric:        Mood and Affect: Mood normal.        Behavior: Behavior normal.        Thought Content: Thought content normal.     Comments: Well groomed, good eye contact, normal speech and thoughts    Results for orders placed or performed in visit on 03/26/23  VAS [place] ABI WITH/WO TBI  Result Value Ref Range   Right ABI 1.07    Left ABI 1.16       Assessment & Plan:   Problem List Items Addressed This Visit     Gout involving toe   Relevant Orders   Uric acid   Knee pain - Primary   Relevant Orders   DG Knee Complete 4 Views Left   Osteoarthritis of left knee   Relevant Orders   DG Knee Complete 4 Views Left   Pain in limb   Relevant Orders   Ambulatory referral to Neurology   Other Visit Diagnoses      Other polyneuropathy       Relevant Orders   Hemoglobin A1c   Ambulatory referral to Neurology   Vitamin B12 nutritional deficiency       Relevant Orders   Vitamin B12   Vitamin D deficiency       Relevant Orders   VITAMIN D 25 Hydroxy (Vit-D Deficiency, Fractures)   Abnormal glucose       Relevant Orders   Hemoglobin A1c       Left Lower Extremity Pain  Multifactorial - L Knee arthritis, Gout, Neuropathic pain, Obesity  Need more diagnostic evaluation  Seems unlikely vascular PAD related. Since has followed with Vascular specialty. Prior DVT on R side. Not on Left, has had circulatory testing.   X-ray today L knee, pending Lab orders in today for Uric Acid, A1c, Vitamin Testing  Inadequate therapy with tylenol, discussed safe dosing  She is on oral anticoagulant, low dose, advised caution with NSAID dosing. Instead rely on Tylenol  Recommend to start taking Tylenol Extra Strength 500mg  tabs - take 1 to 2 tabs per dose (max 1000mg ) every 6-8 hours for pain (take regularly, don't skip a dose for next 7 days), max 24 hour daily dose is 6 tablets or 3000mg . In the future you can repeat the same everyday Tylenol course for 1-2 weeks at a time.   Keep on Tramadol as you are half pill twice a day, we can re order in future if this is successful  Unable to tolerate Gabapentin, will pause now, we can consider Lyrica or other therapy, maybe Cymbalta (Duloxetine) as indicated going forward   Referral to Neurology for nerve conduction testing and evaluation.  [facility] - Neurology Dept [address] [place], [place] [address] Phone: [phone]  Orders Placed This Encounter  Procedures   DG Knee Complete 4 Views Left    Standing Status:   Future    Number of Occurrences:   1    Standing Expiration Date:   04/14/2024    Order Specific Question:   Reason for Exam (SYMPTOM  OR DIAGNOSIS REQUIRED)    Answer:   Chronic Left Knee Pain, arthritis and history of gout     Order Specific Question:   Preferred imaging location?    Answer:   [facility]  Uric acid   Hemoglobin A1c   Vitamin B12   VITAMIN D 25 Hydroxy (Vit-D Deficiency, Fractures)   Ambulatory referral to Neurology    Referral Priority:   Routine    Referral Type:   Consultation    Referral Reason:   Specialty Services Required    Requested Specialty:   Neurology    Number of Visits Requested:   1     No orders of the defined types were placed in this encounter.    Follow up plan: Return in about 6 weeks (around 05/27/2023) for 6 week follow-up lower ext pain / neuro / podiatry updates.  Saralyn Pilar, DO [facility] 04/15/2023, 1:31 PM

## 2023-04-15 NOTE — Patient Instructions (Addendum)
Thank you for coming to the office today.  Need to do further imaging L knee X-ray  Recommend to start taking Tylenol Extra Strength 500mg  tabs - take 1 to 2 tabs per dose (max 1000mg ) every 6-8 hours for pain (take regularly, don't skip a dose for next 7 days), max 24 hour daily dose is 6 tablets or 3000mg . In the future you can repeat the same everyday Tylenol course for 1-2 weeks at a time.   Keep on Tramadol as you are,  ----------------------------------------  Lab orders in today for Uric Acid, A1c, Vitamin Testing  -----------------  Referral to Neurology for nerve conduction testing and evaluation.  [facility] - Neurology Dept [address] [place], [place] [address] Phone: [phone]  Please schedule a Follow-up Appointment to: Return in about 6 weeks (around 05/27/2023) for 6 week follow-up lower ext pain / neuro / podiatry updates.  If you have any other questions or concerns, please feel free to call the office or send a message through MyChart. You may also schedule an earlier appointment if necessary.  Additionally, you may be receiving a survey about your experience at our office within a few days to 1 week by e-mail or mail. We value your feedback.  Saralyn Pilar, DO [facility], [place]

## 2023-04-16 LAB — URIC ACID: Uric Acid, Serum: 6 mg/dL (ref 2.5–7.0)

## 2023-04-16 LAB — VITAMIN B12: Vitamin B-12: 540 pg/mL (ref 200–1100)

## 2023-04-16 LAB — HEMOGLOBIN A1C
Hgb A1c MFr Bld: 6 % of total Hgb — ABNORMAL HIGH (ref <5.7)
Mean Plasma Glucose: 126 mg/dL
eAG (mmol/L): 7 mmol/L

## 2023-04-16 LAB — VITAMIN D 25 HYDROXY (VIT D DEFICIENCY, FRACTURES): Vit D, 25-Hydroxy: 28 ng/mL — ABNORMAL LOW (ref 30–100)

## 2023-04-16 NOTE — Progress Notes (Unsigned)
03/03/23 11:15 AM    [employer] [facility] 05-Sep-1946 [id number]   Referring provider:  Smitty Cords, DO [address] [place],  [place] [address]   Urological history  1. Renal cell carcinoma - s/p left nephrectomy ~27 years ago for RCC - RUS 12/2021 - NED   2. Nephrolithiasis - no documented stones > 10 years - RUS on 01/22/2022 showed no stone burden      HPI: Carrie Larsen is a 77 y.o.female who presents today for a 1 year follow-up.  She is having 1-7 daytime urinations, 1-2 nighttime urinations with a mild urge to urinate.  She wears panty liners during the day and depends at night.  She does not limit fluid intake and she does not engage in toilet mapping.  Patient denies any modifying or aggravating factors.  Patient denies any gross hematuria, dysuria or suprapubic/flank pain.  Patient denies any fevers, chills, nausea or vomiting.     PVR 0 mL      PMH:     Past Medical History:  Diagnosis Date   Anxiety     Arthritis      osteoarthritis   CAD (coronary artery disease)      a. 2012 ETT: no ischemia;  b. 06/2017 NSTEMI/PCI: LM nl, LAD nl, D1 70-80p, LCX nl, OM1 90 (3.0 x 23 Xience Alpine), RCA dominant, 100p/m, fills via L->R collats, EF 55-65%.   Cancer      a. s/p partial left nephrectomy.   Chicken pox     CME (cystoid macular edema), left 11/20/2018   Colon cancer 1992    T3, N1, M0. colon    Colon polyp 2011   COVID-19 07/2019   Diastolic dysfunction      a. 08/2015 Echo: EF 60-65%, no rwma, Gr1 DD, midlly dil LA, PASP . No significant valvular dzs; b. 06/2017 Echo: EF 60-65%, Gr1DD, mildly dil LA.   Essential hypertension     GERD (gastroesophageal reflux disease)     Gout     History of appendectomy 06/07/2015   History of kidney cancer 11/20/2018   History of kidney stones      passed - 2   Hyperlipidemia     Lumbar spinal stenosis     Myocardial infarction      06/2017   Nonexudative age-related macular degeneration, bilateral, early dry  stage 11/20/2018   Obesity     Prediabetes     Pseudophakia of both eyes     Retinal cyst        Surgical History:      Past Surgical History:  Procedure Laterality Date   ABDOMINAL HYSTERECTOMY       APPENDECTOMY   06/07/2015   CARDIAC CATHETERIZATION       CATARACT EXTRACTION W/PHACO Left 04/24/2016    Procedure: CATARACT EXTRACTION PHACO AND INTRAOCULAR LENS PLACEMENT (IOC);  Surgeon: Galen Manila, MD;  Location: [facility] ORS;  Service: Ophthalmology;  Laterality: Left;  [place] 45.8 AP% 16.4 CDE 7.53 FLUID PACK LOT # [id number]   CATARACT EXTRACTION W/PHACO Right 06/05/2016    Procedure: CATARACT EXTRACTION PHACO AND INTRAOCULAR LENS PLACEMENT (IOC);  Surgeon: Galen Manila, MD;  Location: [facility] ORS;  Service: Ophthalmology;  Laterality: Right;  [place] 25.9 AP% 21.9 CDE 5.68 Fluid pack lot # [id number]   COLON RESECTION   03/04/1991   COLON SURGERY       COLONOSCOPY   03-14-10    Dr Lemar Livings, tubular adenoma at 25 cm.   COLONOSCOPY WITH PROPOFOL  N/A 06/07/2015    Procedure: COLONOSCOPY WITH PROPOFOL;  Surgeon: Earline Mayotte, MD;  Location: [facility] ENDOSCOPY;  Service: Endoscopy;  Laterality: N/A;   COLONOSCOPY WITH PROPOFOL N/A 08/10/2020    Procedure: COLONOSCOPY WITH PROPOFOL;  Surgeon: Earline Mayotte, MD;  Location: [facility] ENDOSCOPY;  Service: Endoscopy;  Laterality: N/A;   CORONARY STENT INTERVENTION N/A 06/28/2017    Procedure: CORONARY STENT INTERVENTION;  Surgeon: Alwyn Pea, MD;  Location: [facility] INVASIVE CV LAB;  Service: Cardiovascular;  Laterality: N/A;   EYE SURGERY        lens eye surgery   HERNIA REPAIR   1993    umbilical   LEFT HEART CATH AND CORONARY ANGIOGRAPHY N/A 06/28/2017    Procedure: LEFT HEART CATH AND CORONARY ANGIOGRAPHY;  Surgeon: Antonieta Iba, MD;  Location: [facility] INVASIVE CV LAB;  Service: Cardiovascular;  Laterality: N/A;   LUMBAR LAMINECTOMY/DECOMPRESSION MICRODISCECTOMY Bilateral 03/10/2018    Procedure: Laminectomy and Foraminotomy - Lumbar  three-Lumbar four - Lumbar four-Lumbar five - bilateral;  Surgeon: Julio Sicks, MD;  Location: [facility] OR;  Service: Neurosurgery;  Laterality: Bilateral;   NEPHRECTOMY   1992    left- cancer   PERIPHERAL VASCULAR THROMBECTOMY Right 10/01/2022    Procedure: PERIPHERAL VASCULAR THROMBECTOMY;  Surgeon: Annice Needy, MD;  Location: [facility] INVASIVE CV LAB;  Service: Cardiovascular;  Laterality: Right;   RIB RESECTION        removal 4 ribs   TONSILLECTOMY          Home Medications:  Allergies as of 03/04/2023         Reactions    Morphine And Related Nausea Only    Prednisone Nausea And Vomiting    Amlodipine Palpitations    Latex Rash            Medication List           Accurate as of March 03, 2023 11:15 AM. If you have any questions, ask your nurse or doctor.              allopurinol 100 MG tablet Commonly known as: ZYLOPRIM Take 2 tablets (200 mg total) by mouth daily.    apixaban 5 MG Tabs tablet Commonly known as: Eliquis Take 1 tablet (5 mg total) by mouth daily at 6 (six) AM.    atorvastatin 80 MG tablet Commonly known as: LIPITOR Take 1 tablet by mouth once daily    carvedilol 25 MG tablet Commonly known as: COREG Take 1 tablet (25 mg total) by mouth 2 (two) times daily.    colchicine 0.6 MG tablet Take 1 tablet (0.6 mg total) by mouth daily. Take 1 tablet TID until GI upset or flare subsides, then for maintenance, take 1 tablet daily.    ColciGel Gel Apply gel topical as needed up to 2 times a day for gout.    esomeprazole 40 MG capsule Commonly known as: NEXIUM Take 40 mg by mouth daily in the afternoon.    furosemide 20 MG tablet Commonly known as: LASIX Take 1-2 tablets (20-40 mg total) by mouth daily as needed.    Gemtesa 75 MG Tabs Generic drug: Vibegron Take 75 mg by mouth daily.    ibuprofen 100 MG chewable tablet Commonly known as: ADVIL Chew by mouth every 8 (eight) hours as needed. Patient states MD told her she can took three to four advil a  day for pain up until today.    isosorbide mononitrate 30 MG 24 hr tablet Commonly known as:  IMDUR TAKE 1 TABLET BY MOUTH IN THE MORNING AND AT BEDTIME    lidocaine 5 % Commonly known as: LIDODERM Place 1 patch onto the skin daily. Remove & Discard patch within 12 hours or as directed by MD    multivitamin with minerals tablet Take 1 tablet by mouth daily.    nitroGLYCERIN 0.4 MG SL tablet Commonly known as: NITROSTAT Place 1 tablet (0.4 mg total) under the tongue every 5 (five) minutes x 3 doses as needed for chest pain.    traMADol 50 MG tablet Commonly known as: ULTRAM Take 1 tablet (50 mg total) by mouth every 4 (four) hours as needed.             Allergies:      Allergies  Allergen Reactions   Morphine And Related Nausea Only   Prednisone Nausea And Vomiting   Amlodipine Palpitations   Latex Rash      Family History:      Family History  Problem Relation Age of Onset   Colon cancer Mother     Cerebral aneurysm Father     Heart attack Father     Heart attack Brother 110   Healthy Son        Social History:  reports that she has never smoked. She has never been exposed to tobacco smoke. She has never used smokeless tobacco. She reports that she does not drink alcohol and does not use drugs.     Physical Exam: Blood pressure (!) 153/73, pulse 64, height 5\' 10"  (1.778 m), weight 272 lb 12.8 oz (123.7 kg).  Constitutional:  Well nourished. Alert and oriented, No acute distress. HEENT: [place] AT, moist mucus membranes.  Trachea midline Cardiovascular: No clubbing, cyanosis, or edema. Respiratory: Normal respiratory effort, no increased work of breathing. Neurologic: Grossly intact, no focal deficits, moving all 4 extremities. Psychiatric: Normal mood and affect.     Laboratory Data: Hemoglobin A1c (03/2023) 6.0 Serum creatinine (01/2023) 0.85, eGFR > 60   I have reviewed the labs.    Pertinent Imaging:  04/17/23 14:11  Scan Result 0ml     I have  independently reviewed the films.  See HPI.     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  Nephrolithiasis -Asymptomatic -RUS pending   3. Nocturia  -not bothersome   Michiel Cowboy, PA-C    [facility] [address], [address] [place], [place] [address] [phone]

## 2023-04-17 ENCOUNTER — Encounter: Payer: Self-pay | Admitting: Urology

## 2023-04-17 ENCOUNTER — Encounter: Payer: Self-pay | Admitting: Podiatry

## 2023-04-17 ENCOUNTER — Ambulatory Visit (INDEPENDENT_AMBULATORY_CARE_PROVIDER_SITE_OTHER): Payer: Medicare Other | Admitting: Urology

## 2023-04-17 VITALS — BP 153/73 | HR 64 | Ht 70.0 in | Wt 272.8 lb

## 2023-04-17 DIAGNOSIS — Z87442 Personal history of urinary calculi: Secondary | ICD-10-CM | POA: Diagnosis not present

## 2023-04-17 DIAGNOSIS — N281 Cyst of kidney, acquired: Secondary | ICD-10-CM

## 2023-04-17 DIAGNOSIS — R351 Nocturia: Secondary | ICD-10-CM

## 2023-04-17 DIAGNOSIS — Z905 Acquired absence of kidney: Secondary | ICD-10-CM | POA: Diagnosis not present

## 2023-04-17 LAB — BLADDER SCAN AMB NON-IMAGING

## 2023-04-21 ENCOUNTER — Emergency Department: Payer: Medicare Other

## 2023-04-21 ENCOUNTER — Emergency Department
Admission: EM | Admit: 2023-04-21 | Discharge: 2023-04-21 | Disposition: A | Discharge status: Home / Self Care | Payer: Medicare Other | Attending: Emergency Medicine | Admitting: Emergency Medicine

## 2023-04-21 DIAGNOSIS — K5641 Fecal impaction: Secondary | ICD-10-CM | POA: Diagnosis not present

## 2023-04-21 DIAGNOSIS — K5900 Constipation, unspecified: Secondary | ICD-10-CM | POA: Diagnosis present

## 2023-04-21 DIAGNOSIS — K5903 Drug induced constipation: Secondary | ICD-10-CM

## 2023-04-21 MED ORDER — MAGNESIUM CITRATE PO SOLN
1.0000 | Freq: Once | ORAL | Status: AC
  Administered 2023-04-21: 1 via ORAL
  Filled 2023-04-21: qty 296

## 2023-04-21 NOTE — Discharge Instructions (Addendum)
Use MiraLAX to help with constipation.  Can take 1-2 capfuls per dose, mixed in your drink of choice.

## 2023-04-21 NOTE — ED Provider Notes (Signed)
Patient received in signout from Dr. Cyril Loosen after manual disimpaction for constipation associated with recently prescribed opiates.  With mag citrate, patient has passed a large bowel movement and reports feeling much better, "50 pounds lighter."  She is already called her friend for a ride home.  We discussed cessation of opiates, MiraLAX at home and return precautions.   Delton [place], MD 04/21/23 [phone]

## 2023-04-21 NOTE — ED Notes (Signed)
Patient had positive results from Magnesium Citrate with Large bowel movement

## 2023-04-21 NOTE — ED Provider Notes (Signed)
   [facility] Provider Note    Event Date/Time   First MD Initiated Contact with Patient 04/21/23 2133     (approximate)   History   Constipation   HPI  Carrie Larsen is a 77 y.o. female who was recently started on narcotic pain medication for lower extremity pain who presents with constipation.  Patient reports he has not had a bowel movement in 4 days.  She feels that there is stool in her rectum.     Physical Exam   Triage Vital Signs: ED Triage Vitals [04/21/23 2130]  Enc Vitals Group     BP 134/60     Pulse Rate 67     Resp 16     Temp 98.3 F (36.8 C)     Temp Source Oral     SpO2 96 %     Weight 119.3 kg (263 lb)     Height 1.778 m (5\' 10" )     Head Circumference      Peak Flow      Pain Score 8     Pain Loc      Pain Edu?      Excl. in GC?     Most recent vital signs: Vitals:   04/21/23 2130  BP: 134/60  Pulse: 67  Resp: 16  Temp: 98.3 F (36.8 C)  SpO2: 96%     General: Awake, no distress.  CV:  Good peripheral perfusion.  Resp:  Normal effort.  Abd:  No distention.  Soft, nontender Other:     ED Results / Procedures / Treatments   Labs (all labs ordered are listed, but only abnormal results are displayed) Labs Reviewed - No data to display   EKG     RADIOLOGY KUB viewed interpreted by me, suspect fecal impaction    PROCEDURES:  Critical Care performed:   Procedures  ------------------------------------------------------------------------------------------------------------------- Fecal Disimpaction Procedure Note:  Performed by me:  Patient placed in the lateral recumbent position with knees drawn towards chest. Nurse present for patient support. Large amount of hard brown stool removed. No complications during procedure.   ------------------------------------------------------------------------------------------------------------------   MEDICATIONS ORDERED IN ED: Medications  magnesium  citrate solution 1 Bottle (has no administration in time range)     IMPRESSION / MDM / ASSESSMENT AND PLAN / ED COURSE  I reviewed the triage vital signs and the nursing notes. Patient's presentation is most consistent with acute complicated illness / injury requiring diagnostic workup.  Patient presents with constipation as detailed above.  Differential includes constipation, fecal impaction  KUB suggests fecal impaction, on rectal exam hard stool in the rectal vault.  Disimpaction performed by me, hard brown stool removed by me, patient tolerated poorly  Will give magnesium citrate p.o. and see if the patient is able to have a bowel movement.  DC narcotic pain medication        FINAL CLINICAL IMPRESSION(S) / ED DIAGNOSES   Final diagnoses:  Drug-induced constipation  Fecal impaction in rectum [facility])     Rx / DC Orders   ED Discharge Orders     None        Note:  This document was prepared using Dragon voice recognition software and may include unintentional dictation errors.   Jene Every, MD 04/21/23 2237

## 2023-04-21 NOTE — ED Triage Notes (Signed)
Pt from home reports starting oxycodone 4 days ago for leg pain and has not had a BM since. Pt reports taking colace x4 days with no relief, and reports nausea now.

## 2023-04-24 ENCOUNTER — Telehealth: Payer: Self-pay

## 2023-04-24 NOTE — Transitions of Care (Post Inpatient/ED Visit) (Signed)
04/24/2023  Name: Carrie Larsen MRN: [id number] DOB: 13-Jan-1946  Today's TOC FU Call Status: Today's TOC FU Call Status:: Successful TOC FU Call Competed TOC FU Call Complete Date: 04/24/23  Transition Care Management Follow-up Telephone Call Date of Discharge: 04/21/23 Discharge Facility: [facility]) Type of Discharge: Emergency Department How have you been since you were released from the hospital?: Better Any questions or concerns?: No  Items Reviewed: Did you receive and understand the discharge instructions provided?: Yes Medications obtained,verified, and reconciled?: Yes (Medications Reviewed) Any new allergies since your discharge?: No Dietary orders reviewed?: NA  Medications Reviewed Today: Medications Reviewed Today     Reviewed by Shannan Harper, RN (Registered Nurse) on 04/21/23 at 2132  Med List Status: <None>   Medication Order Taking? Sig Documenting Provider Last Dose Status Informant  allopurinol (ZYLOPRIM) 100 MG tablet [id number]  Take 2 tablets (200 mg total) by mouth daily. Karamalegos, Netta Neat, DO  Active   apixaban (ELIQUIS) 2.5 MG TABS tablet [id number]  Take 1 tablet (2.5 mg total) by mouth 2 (two) times daily. Georgiana Spinner, NP  Active   atorvastatin (LIPITOR) 80 MG tablet [id number]  Take 1 tablet by mouth once daily Gollan, Tollie Pizza, MD  Active   carvedilol (COREG) 25 MG tablet [id number]  Take 1 tablet by mouth twice daily Furth, Cadence H, PA-C  Active   colchicine 0.6 MG tablet [id number]  Take 1 tablet (0.6 mg total) by mouth daily. Take 1 tablet TID until GI upset or flare subsides, then for maintenance, take 1 tablet daily.  Patient taking differently: Take 0.6 mg by mouth daily. Take 1 tablet TID until GI upset or flare subsides, then for maintenance, take 1 tablet daily. Every other day.   Hyatt, Max T, [place]  Active   esomeprazole (NEXIUM) 40 MG capsule [id number]  Take 40 mg by mouth daily in the afternoon.  [provider]  Active Self  furosemide (LASIX) 20 MG tablet [id number]  Take 1-2 tablets (20-40 mg total) by mouth daily as needed. Smitty Cords, DO  Active   ibuprofen (ADVIL) 100 MG chewable tablet [id number]  Chew 200 mg by mouth every 8 (eight) hours as needed. Patient states MD told her she can took three to four advil a day for pain up until today. [provider]  Active   isosorbide mononitrate (IMDUR) 30 MG 24 hr tablet [id number]  TAKE 1 TABLET BY MOUTH IN THE MORNING AND AT BEDTIME Gollan, Tollie Pizza, MD  Active   lidocaine (LIDODERM) 5 % [id number]  Place 1 patch onto the skin as needed. Remove & Discard patch within 12 hours or as directed by MD [provider]  Active   Multiple Vitamins-Minerals (MULTIVITAMIN WITH MINERALS) tablet [id number]  Take 1 tablet by mouth daily. [provider]  Active Self  nitroGLYCERIN (NITROSTAT) 0.4 MG SL tablet [id number]  Place 1 tablet (0.4 mg total) under the tongue every 5 (five) minutes x 3 doses as needed for chest pain. Antonieta Iba, MD  Active            Med Note Effie Shy Oct 01, 2022  2:14 PM) Patient states, "never."  oxyCODONE-acetaminophen (PERCOCET) 5-325 MG tablet [id number]  Take 1 tablet by mouth every 4 (four) hours as needed for severe pain. Candelaria Stagers, DPM  Active   traMADol (ULTRAM) 50 MG tablet [id number]  Take 1 tablet (50 mg total) by mouth every  4 (four) hours as needed. Karamalegos, Netta Neat, DO  Active   Vibegron (GEMTESA) 75 MG TABS [id number]  Take 75 mg by mouth daily. Harle Battiest, PA-C  Active Self            Home Care and Equipment/Supplies: Were Home Health Services Ordered?: NA Any new equipment or medical supplies ordered?: NA  Functional Questionnaire: Do you need assistance with bathing/showering or dressing?: No Do you need assistance with meal preparation?: No Do you need assistance with eating?: No Do you have difficulty maintaining  continence: No Do you need assistance with getting out of bed/getting out of a chair/moving?: No Do you have difficulty managing or taking your medications?: No  Follow up appointments reviewed: PCP Follow-up appointment confirmed?: NA Specialist Hospital Follow-up appointment confirmed?: NA Do you need transportation to your follow-up appointment?: No Do you understand care options if your condition(s) worsen?: Yes-patient verbalized understanding    Oneal Grout, [facility]) Lovie Macadamia [phone]

## 2023-05-01 ENCOUNTER — Ambulatory Visit: Payer: Self-pay | Admitting: *Deleted

## 2023-05-01 NOTE — Telephone Encounter (Signed)
meds for pain   Pt has a lot in legs and feet and she has been taking Tramadol as well as Tylenol and neither has been working for her. She is asking if he wants to prescribe another medication for her. NEURONTIN makes her dizzy      Attempted to reach pt, left VM to call back.

## 2023-05-01 NOTE — Telephone Encounter (Signed)
Message from [place] sent at 05/01/2023 11:44 AM EDT  Summary: meds for pain   Pt has a lot in legs and feet and she has been taking Tramadol as well as Tylenol and neither has been working for her. She is asking if he wants to prescribe another medication for her. NEURONTIN makes her dizzy        Called pt and LM on VM to call back to discuss sx.

## 2023-05-01 NOTE — Telephone Encounter (Signed)
Three attempts to reach pt, left VM to call back each attempt. Routing to practice for PCP's resolution per protocol.   

## 2023-05-06 ENCOUNTER — Ambulatory Visit: Payer: Medicare Other | Admitting: Family Medicine

## 2023-05-07 ENCOUNTER — Ambulatory Visit: Payer: Medicare Other

## 2023-05-09 ENCOUNTER — Ambulatory Visit: Payer: Medicare Other | Admitting: Podiatry

## 2023-05-10 ENCOUNTER — Ambulatory Visit: Payer: Medicare Other

## 2023-05-21 ENCOUNTER — Ambulatory Visit: Payer: Medicare Other | Admitting: Podiatry

## 2023-05-27 ENCOUNTER — Ambulatory Visit: Payer: Medicare Other | Admitting: Family Medicine

## 2023-05-28 ENCOUNTER — Ambulatory Visit: Payer: Medicare Other | Admitting: Podiatry

## 2023-06-10 DIAGNOSIS — S27809A Unspecified injury of diaphragm, initial encounter: Secondary | ICD-10-CM | POA: Insufficient documentation

## 2023-06-10 DIAGNOSIS — J9600 Acute respiratory failure, unspecified whether with hypoxia or hypercapnia: Secondary | ICD-10-CM | POA: Insufficient documentation

## 2023-06-10 DIAGNOSIS — J9601 Acute respiratory failure with hypoxia: Secondary | ICD-10-CM | POA: Insufficient documentation

## 2023-06-10 DIAGNOSIS — Z930 Tracheostomy status: Secondary | ICD-10-CM | POA: Insufficient documentation

## 2023-06-10 DIAGNOSIS — T17500A Unspecified foreign body in bronchus causing asphyxiation, initial encounter: Secondary | ICD-10-CM | POA: Insufficient documentation

## 2023-06-24 NOTE — Progress Notes (Deleted)
Cardiology Office Note  Date:  06/24/2023   ID:  Carrie Larsen, Park Meo [birth year], MRN [id number]  PCP:  Smitty Cords, DO   No chief complaint on file.   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, he is medicated Stays at a "[employer]", lots of stairs No  regular exercise program Sedentary, weight continues to trend upwards Previously did PT with him  Weight 265 pounds on last clinic visit now up to 70 Has arthritis pain  Denies chest pain or shortness of breath concerning for angina  No recent flares up of her gout Chronic back pain  EKG personally reviewed by myself on todays visit Nsr rate 60 bpm consider old inferior MI  Other past medical history reviewed Seen in the hospital September 2020 for chest pain Cardiac enzymes negative EKG unchanged Felt to be most likely musculoskeletal  Hospital admission August 2018 for non-STEMI Troponin up to 0.88 CT scan with no PE Cardiac catheterization with PCI of OM vessel There is occluded proximal to mid RCA with collaterals from left to right, appeared chronic  Echocardiogram with ejection fraction 55-65%   She reports having an episode of left-sided numbness on her face 11/14/2013. Workup was negative. Including cardiac enzymes, basic metabolic panels, CBC, chest x-ray and urinalysis     PMH:   has a past medical history of Anxiety, Arthritis, CAD (coronary artery disease), Cancer (HCC),  Chicken pox, CME (cystoid macular edema), left (11/20/2018), Colon cancer (HCC) (1992), Colon polyp (2011), COVID-19 (07/2019), Diastolic dysfunction, Essential hypertension, GERD (gastroesophageal reflux disease), Gout, History of appendectomy (06/07/2015), History of kidney cancer (11/20/2018), History of kidney stones, Hyperlipidemia, Lumbar spinal stenosis, Myocardial infarction (HCC), Nonexudative age-related macular degeneration, bilateral, early dry stage (11/20/2018), Obesity, Prediabetes, Pseudophakia of both eyes, and Retinal cyst.  PSH:    Past Surgical History:  Procedure Laterality Date   ABDOMINAL HYSTERECTOMY     APPENDECTOMY  06/07/2015   CARDIAC CATHETERIZATION     CATARACT EXTRACTION W/PHACO Left 04/24/2016   Procedure: CATARACT EXTRACTION PHACO AND INTRAOCULAR LENS PLACEMENT (IOC);  Surgeon: Galen Manila, MD;  Location: [facility] ORS;  Service: Ophthalmology;  Laterality: Left;  [place] 45.8 AP% 16.4 CDE 7.53 FLUID PACK LOT # [id number]   CATARACT EXTRACTION W/PHACO Right 06/05/2016   Procedure: CATARACT EXTRACTION PHACO AND INTRAOCULAR LENS PLACEMENT (IOC);  Surgeon: Galen Manila, MD;  Location: [facility] ORS;  Service: Ophthalmology;  Laterality: Right;  [place] 25.9 AP% 21.9 CDE 5.68 Fluid pack lot # [id number]   COLON RESECTION  03/04/1991   COLON SURGERY     COLONOSCOPY  03-14-10   Dr Lemar Livings, tubular adenoma at 25 cm.   COLONOSCOPY WITH PROPOFOL N/A 06/07/2015   Procedure: COLONOSCOPY WITH PROPOFOL;  Surgeon: Earline Mayotte, MD;  Location: [facility] ENDOSCOPY;  Service: Endoscopy;  Laterality: N/A;   COLONOSCOPY WITH PROPOFOL N/A 08/10/2020   Procedure: COLONOSCOPY WITH PROPOFOL;  Surgeon: Earline Mayotte, MD;  Location: [facility] ENDOSCOPY;  Service: Endoscopy;  Laterality: N/A;  CORONARY STENT INTERVENTION N/A 06/28/2017   Procedure: CORONARY STENT INTERVENTION;  Surgeon: Alwyn Pea, MD;  Location: [facility] INVASIVE CV LAB;  Service: Cardiovascular;  Laterality: N/A;   EYE SURGERY      lens eye surgery   HERNIA REPAIR  1993   umbilical   LEFT HEART CATH AND CORONARY ANGIOGRAPHY N/A 06/28/2017   Procedure: LEFT HEART CATH AND CORONARY ANGIOGRAPHY;  Surgeon: Antonieta Iba, MD;  Location: [facility] INVASIVE CV LAB;  Service: Cardiovascular;  Laterality: N/A;   LUMBAR LAMINECTOMY/DECOMPRESSION MICRODISCECTOMY Bilateral 03/10/2018   Procedure: Laminectomy and Foraminotomy - Lumbar three-Lumbar four - Lumbar four-Lumbar five - bilateral;  Surgeon: Julio Sicks, MD;  Location: [facility] OR;  Service: Neurosurgery;  Laterality: Bilateral;   NEPHRECTOMY  1992   left- cancer   PERIPHERAL VASCULAR THROMBECTOMY Right 10/01/2022   Procedure: PERIPHERAL VASCULAR THROMBECTOMY;  Surgeon: Annice Needy, MD;  Location: [facility] INVASIVE CV LAB;  Service: Cardiovascular;  Laterality: Right;   RIB RESECTION     removal 4 ribs   TONSILLECTOMY      Current Outpatient Medications  Medication Sig Dispense Refill   allopurinol (ZYLOPRIM) 100 MG tablet Take 2 tablets (200 mg total) by mouth daily. 180 tablet 1   apixaban (ELIQUIS) 2.5 MG TABS tablet Take 1 tablet (2.5 mg total) by mouth 2 (two) times daily. 60 tablet 2   atorvastatin (LIPITOR) 80 MG tablet Take 1 tablet by mouth once daily 90 tablet 0   carvedilol (COREG) 25 MG tablet Take 1 tablet by mouth twice daily 180 tablet 0   colchicine 0.6 MG tablet Take 1 tablet (0.6 mg total) by mouth daily. Take 1 tablet TID until GI upset or flare subsides, then for maintenance, take 1 tablet daily. (Patient taking differently: Take 0.6 mg by mouth daily. Take 1 tablet TID until GI upset or flare subsides, then for maintenance, take 1 tablet daily. Every other day.) 90 tablet 3   esomeprazole (NEXIUM) 40 MG capsule Take 40 mg by mouth daily in the afternoon.     furosemide (LASIX) 20 MG tablet Take 1-2 tablets (20-40 mg total) by mouth daily as needed. 30 tablet 2   ibuprofen (ADVIL) 100 MG chewable tablet Chew 200 mg by mouth every 8 (eight) hours as needed. Patient  states MD told her she can took three to four advil a day for pain up until today.     isosorbide mononitrate (IMDUR) 30 MG 24 hr tablet TAKE 1 TABLET BY MOUTH IN THE MORNING AND AT BEDTIME 180 tablet 0   lidocaine (LIDODERM) 5 % Place 1 patch onto the skin as needed. Remove & Discard patch within 12 hours or as directed by MD     Multiple Vitamins-Minerals (MULTIVITAMIN WITH MINERALS) tablet Take 1 tablet by mouth daily.     nitroGLYCERIN (NITROSTAT) 0.4 MG SL tablet Place 1 tablet (0.4 mg total) under the tongue every 5 (five) minutes x 3 doses as needed for chest pain. 25 tablet 1   oxyCODONE-acetaminophen (PERCOCET) 5-325 MG tablet Take 1 tablet by mouth every 4 (four) hours as needed for severe pain. 30 tablet 0   traMADol (ULTRAM) 50 MG tablet Take 1 tablet (50 mg total) by mouth every 4 (four) hours as needed. 30 tablet 2   Vibegron (GEMTESA) 75 MG TABS Take 75 mg by mouth daily. 28 tablet 0   No current facility-administered medications for this visit.    Allergies:   Morphine and codeine, Prednisone, Amlodipine, and Latex  Social History:  The patient  reports that she has never smoked. She has never been exposed to tobacco smoke. She has never used smokeless tobacco. She reports that she does not drink alcohol and does not use drugs.   Family History:   family history includes Cerebral aneurysm in her father; Colon cancer in her mother; Healthy in her son; Heart attack in her father; Heart attack (age of onset: 62) in her brother.    Review of Systems: Review of Systems  Constitutional: Negative.   Respiratory: Negative.    Cardiovascular: Negative.   Gastrointestinal: Negative.   Musculoskeletal:  Positive for back pain.  Neurological: Negative.   Psychiatric/Behavioral: Negative.    All other systems reviewed and are negative.  PHYSICAL EXAM: VS:  There were no vitals taken for this visit. , BMI There is no height or weight on file to calculate BMI.  Constitutional:   oriented to person, place, and time. No distress.  Gait instability HENT:  Head: Grossly normal Eyes:  no discharge. No scleral icterus.  Neck: No JVD, no carotid bruits  Cardiovascular: Regular rate and rhythm, no murmurs appreciated Pulmonary/Chest: Clear to auscultation bilaterally, no wheezes or rails Abdominal: Soft.  no distension.  no tenderness.  Musculoskeletal: Normal range of motion Neurological:  normal muscle tone. Coordination normal. No atrophy Skin: Skin warm and dry Psychiatric: normal affect, pleasant  Recent Labs: 08/09/2022: TSH 2.34 08/23/2022: ALT 23 02/01/2023: BUN 14; Creatinine, Ser 0.85; Hemoglobin 13.6; Platelets 213; Potassium 4.1; Sodium 139    Lipid Panel Lab Results  Component Value Date   CHOL 191 08/09/2022   HDL 53 08/09/2022   LDLCALC 116 (H) 08/09/2022   TRIG 114 08/09/2022      Wt Readings from Last 3 Encounters:  04/21/23 263 lb (119.3 kg)  04/17/23 272 lb 12.8 oz (123.7 kg)  04/15/23 273 lb (123.8 kg)     ASSESSMENT AND PLAN:  Non-STEMI (non-ST elevated myocardial infarction) (HCC) Prior intervention 2018 on aspirin 81 mg daily, off plavix Cholesterol above goal, she will work on her weight, stay on statin Denies anginal symptoms  Mixed hyperlipidemia She is currently on Lipitor 80 Unable to tolerate Zetia, cause diarrhea Will request new labs, she prefers to have it done with PMD Could consider PCSK9 inhibitor  Essential hypertension BP elevated, check at home and call us with numbers Start lasix 20 milligram 1-2 x a week Will need periodic BMP check  Morbid obesity We have encouraged continued exercise, careful diet management in an effort to lose weight.  Shortness of breath Stable symptoms Weight continues to trend upwards, deconditioned, walking with a walker Lasix as needed   Total encounter time more than 30 minutes  Greater than 50% was spent in counseling and coordination of care with the patient    No  orders of the defined types were placed in this encounter.    Signed, Dossie Arbour, M.D., Ph.D. 06/24/2023  [facility] [place], [place] [phone]

## 2023-06-25 ENCOUNTER — Encounter: Payer: Self-pay | Admitting: Cardiovascular Disease

## 2023-06-25 ENCOUNTER — Ambulatory Visit: Payer: Medicare Other | Attending: Cardiovascular Disease | Admitting: Cardiovascular Disease

## 2023-06-25 DIAGNOSIS — I82409 Acute embolism and thrombosis of unspecified deep veins of unspecified lower extremity: Secondary | ICD-10-CM

## 2023-06-25 DIAGNOSIS — I10 Essential (primary) hypertension: Secondary | ICD-10-CM

## 2023-06-25 DIAGNOSIS — M7989 Other specified soft tissue disorders: Secondary | ICD-10-CM

## 2023-06-25 DIAGNOSIS — R0609 Other forms of dyspnea: Secondary | ICD-10-CM

## 2023-06-25 DIAGNOSIS — E782 Mixed hyperlipidemia: Secondary | ICD-10-CM

## 2023-06-25 DIAGNOSIS — R600 Localized edema: Secondary | ICD-10-CM

## 2023-06-25 DIAGNOSIS — R410 Disorientation, unspecified: Secondary | ICD-10-CM | POA: Insufficient documentation

## 2023-06-25 DIAGNOSIS — R002 Palpitations: Secondary | ICD-10-CM

## 2023-06-25 DIAGNOSIS — I2510 Atherosclerotic heart disease of native coronary artery without angina pectoris: Secondary | ICD-10-CM

## 2023-06-28 ENCOUNTER — Telehealth: Payer: Self-pay | Admitting: Family Medicine

## 2023-06-28 NOTE — Telephone Encounter (Signed)
Called 06/28/2023 to sched AWV - MAILBOX FULL  Carrie Larsen; Care Guide Ambulatory Clinical Support [place] [facility] Direct Dial: [phone]

## 2023-07-02 DIAGNOSIS — E8770 Fluid overload, unspecified: Secondary | ICD-10-CM | POA: Insufficient documentation

## 2023-07-02 DIAGNOSIS — S3600XA Unspecified injury of spleen, initial encounter: Secondary | ICD-10-CM | POA: Insufficient documentation

## 2023-07-02 DIAGNOSIS — E870 Hyperosmolality and hypernatremia: Secondary | ICD-10-CM | POA: Insufficient documentation

## 2023-07-02 DIAGNOSIS — R188 Other ascites: Secondary | ICD-10-CM | POA: Insufficient documentation

## 2023-07-02 DIAGNOSIS — R52 Pain, unspecified: Secondary | ICD-10-CM | POA: Insufficient documentation

## 2023-07-02 DIAGNOSIS — S129XXA Fracture of neck, unspecified, initial encounter: Secondary | ICD-10-CM | POA: Insufficient documentation

## 2023-07-02 DIAGNOSIS — D649 Anemia, unspecified: Secondary | ICD-10-CM | POA: Insufficient documentation

## 2023-07-03 ENCOUNTER — Telehealth: Payer: Self-pay | Admitting: Cardiovascular Disease
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
## 2023-07-17 DIAGNOSIS — J9621 Acute and chronic respiratory failure with hypoxia: Secondary | ICD-10-CM

## 2023-07-17 DIAGNOSIS — J9809 Other diseases of bronchus, not elsewhere classified: Secondary | ICD-10-CM

## 2023-07-17 DIAGNOSIS — Z930 Tracheostomy status: Secondary | ICD-10-CM

## 2023-07-17 DIAGNOSIS — S27809S Unspecified injury of diaphragm, sequela: Secondary | ICD-10-CM

## 2023-07-19 DIAGNOSIS — Z930 Tracheostomy status: Secondary | ICD-10-CM

## 2023-07-19 DIAGNOSIS — J9621 Acute and chronic respiratory failure with hypoxia: Secondary | ICD-10-CM

## 2023-07-19 DIAGNOSIS — J9809 Other diseases of bronchus, not elsewhere classified: Secondary | ICD-10-CM

## 2023-07-19 DIAGNOSIS — S27809S Unspecified injury of diaphragm, sequela: Secondary | ICD-10-CM

## 2023-07-22 DIAGNOSIS — S27809S Unspecified injury of diaphragm, sequela: Secondary | ICD-10-CM

## 2023-07-22 DIAGNOSIS — Z930 Tracheostomy status: Secondary | ICD-10-CM

## 2023-07-22 DIAGNOSIS — J9621 Acute and chronic respiratory failure with hypoxia: Secondary | ICD-10-CM

## 2023-07-22 DIAGNOSIS — J9809 Other diseases of bronchus, not elsewhere classified: Secondary | ICD-10-CM

## 2023-07-24 DIAGNOSIS — J9621 Acute and chronic respiratory failure with hypoxia: Secondary | ICD-10-CM

## 2023-07-24 DIAGNOSIS — J9809 Other diseases of bronchus, not elsewhere classified: Secondary | ICD-10-CM

## 2023-07-24 DIAGNOSIS — Z930 Tracheostomy status: Secondary | ICD-10-CM

## 2023-07-24 DIAGNOSIS — S27809S Unspecified injury of diaphragm, sequela: Secondary | ICD-10-CM

## 2023-07-25 DIAGNOSIS — Z930 Tracheostomy status: Secondary | ICD-10-CM

## 2023-07-25 DIAGNOSIS — J9809 Other diseases of bronchus, not elsewhere classified: Secondary | ICD-10-CM | POA: Diagnosis not present

## 2023-07-25 DIAGNOSIS — J9621 Acute and chronic respiratory failure with hypoxia: Secondary | ICD-10-CM

## 2023-07-26 DIAGNOSIS — J9621 Acute and chronic respiratory failure with hypoxia: Secondary | ICD-10-CM | POA: Diagnosis not present

## 2023-07-26 DIAGNOSIS — Z930 Tracheostomy status: Secondary | ICD-10-CM | POA: Diagnosis not present

## 2023-07-26 DIAGNOSIS — J9809 Other diseases of bronchus, not elsewhere classified: Secondary | ICD-10-CM | POA: Diagnosis not present

## 2023-07-29 DIAGNOSIS — S27809S Unspecified injury of diaphragm, sequela: Secondary | ICD-10-CM | POA: Diagnosis not present

## 2023-07-29 DIAGNOSIS — J9621 Acute and chronic respiratory failure with hypoxia: Secondary | ICD-10-CM | POA: Diagnosis not present

## 2023-07-29 DIAGNOSIS — Z930 Tracheostomy status: Secondary | ICD-10-CM | POA: Diagnosis not present

## 2023-07-29 DIAGNOSIS — J9809 Other diseases of bronchus, not elsewhere classified: Secondary | ICD-10-CM | POA: Diagnosis not present

## 2023-07-31 DIAGNOSIS — J9621 Acute and chronic respiratory failure with hypoxia: Secondary | ICD-10-CM | POA: Diagnosis not present

## 2023-07-31 DIAGNOSIS — J9809 Other diseases of bronchus, not elsewhere classified: Secondary | ICD-10-CM | POA: Diagnosis not present

## 2023-07-31 DIAGNOSIS — Z930 Tracheostomy status: Secondary | ICD-10-CM | POA: Diagnosis not present

## 2023-08-02 DIAGNOSIS — Z930 Tracheostomy status: Secondary | ICD-10-CM | POA: Diagnosis not present

## 2023-08-02 DIAGNOSIS — J9809 Other diseases of bronchus, not elsewhere classified: Secondary | ICD-10-CM | POA: Diagnosis not present

## 2023-08-02 DIAGNOSIS — J9621 Acute and chronic respiratory failure with hypoxia: Secondary | ICD-10-CM | POA: Diagnosis not present

## 2023-08-05 DIAGNOSIS — J9809 Other diseases of bronchus, not elsewhere classified: Secondary | ICD-10-CM | POA: Diagnosis not present

## 2023-08-05 DIAGNOSIS — J9621 Acute and chronic respiratory failure with hypoxia: Secondary | ICD-10-CM | POA: Diagnosis not present

## 2023-08-05 DIAGNOSIS — Z930 Tracheostomy status: Secondary | ICD-10-CM | POA: Diagnosis not present

## 2023-08-06 DIAGNOSIS — J9809 Other diseases of bronchus, not elsewhere classified: Secondary | ICD-10-CM | POA: Diagnosis not present

## 2023-08-06 DIAGNOSIS — J9621 Acute and chronic respiratory failure with hypoxia: Secondary | ICD-10-CM | POA: Diagnosis not present

## 2023-08-06 DIAGNOSIS — Z930 Tracheostomy status: Secondary | ICD-10-CM | POA: Diagnosis not present

## 2023-08-07 DIAGNOSIS — J9809 Other diseases of bronchus, not elsewhere classified: Secondary | ICD-10-CM

## 2023-08-07 DIAGNOSIS — Z930 Tracheostomy status: Secondary | ICD-10-CM

## 2023-08-07 DIAGNOSIS — J9621 Acute and chronic respiratory failure with hypoxia: Secondary | ICD-10-CM

## 2023-08-08 DIAGNOSIS — J9621 Acute and chronic respiratory failure with hypoxia: Secondary | ICD-10-CM

## 2023-08-08 DIAGNOSIS — J9809 Other diseases of bronchus, not elsewhere classified: Secondary | ICD-10-CM

## 2023-08-08 DIAGNOSIS — Z930 Tracheostomy status: Secondary | ICD-10-CM

## 2023-08-09 DIAGNOSIS — Z930 Tracheostomy status: Secondary | ICD-10-CM | POA: Diagnosis not present

## 2023-08-09 DIAGNOSIS — J9621 Acute and chronic respiratory failure with hypoxia: Secondary | ICD-10-CM | POA: Diagnosis not present

## 2023-08-09 DIAGNOSIS — J9809 Other diseases of bronchus, not elsewhere classified: Secondary | ICD-10-CM | POA: Diagnosis not present

## 2023-08-12 DIAGNOSIS — J9809 Other diseases of bronchus, not elsewhere classified: Secondary | ICD-10-CM

## 2023-08-12 DIAGNOSIS — Z930 Tracheostomy status: Secondary | ICD-10-CM

## 2023-08-12 DIAGNOSIS — J9621 Acute and chronic respiratory failure with hypoxia: Secondary | ICD-10-CM

## 2023-08-14 DIAGNOSIS — Z930 Tracheostomy status: Secondary | ICD-10-CM

## 2023-08-14 DIAGNOSIS — J9621 Acute and chronic respiratory failure with hypoxia: Secondary | ICD-10-CM

## 2023-08-14 DIAGNOSIS — J9809 Other diseases of bronchus, not elsewhere classified: Secondary | ICD-10-CM

## 2023-08-16 DIAGNOSIS — J9809 Other diseases of bronchus, not elsewhere classified: Secondary | ICD-10-CM | POA: Diagnosis not present

## 2023-08-16 DIAGNOSIS — J9621 Acute and chronic respiratory failure with hypoxia: Secondary | ICD-10-CM | POA: Diagnosis not present

## 2023-08-16 DIAGNOSIS — Z930 Tracheostomy status: Secondary | ICD-10-CM | POA: Diagnosis not present

## 2023-08-19 DIAGNOSIS — J9621 Acute and chronic respiratory failure with hypoxia: Secondary | ICD-10-CM | POA: Diagnosis not present

## 2023-08-19 DIAGNOSIS — Z930 Tracheostomy status: Secondary | ICD-10-CM | POA: Diagnosis not present

## 2023-08-19 DIAGNOSIS — J9809 Other diseases of bronchus, not elsewhere classified: Secondary | ICD-10-CM | POA: Diagnosis not present

## 2023-08-20 DIAGNOSIS — Z930 Tracheostomy status: Secondary | ICD-10-CM | POA: Diagnosis not present

## 2023-08-20 DIAGNOSIS — J9621 Acute and chronic respiratory failure with hypoxia: Secondary | ICD-10-CM | POA: Diagnosis not present

## 2023-08-20 DIAGNOSIS — J9809 Other diseases of bronchus, not elsewhere classified: Secondary | ICD-10-CM | POA: Diagnosis not present

## 2023-08-21 DIAGNOSIS — J9809 Other diseases of bronchus, not elsewhere classified: Secondary | ICD-10-CM | POA: Diagnosis not present

## 2023-08-21 DIAGNOSIS — J9621 Acute and chronic respiratory failure with hypoxia: Secondary | ICD-10-CM | POA: Diagnosis not present

## 2023-08-21 DIAGNOSIS — Z930 Tracheostomy status: Secondary | ICD-10-CM | POA: Diagnosis not present

## 2023-09-05 ENCOUNTER — Emergency Department: Payer: Medicare Other

## 2023-09-05 ENCOUNTER — Emergency Department
Admission: EM | Admit: 2023-09-05 | Discharge: 2023-09-05 | Disposition: A | Discharge status: Home / Self Care | Payer: Medicare Other | Attending: Emergency Medicine | Admitting: Emergency Medicine

## 2023-09-05 DIAGNOSIS — N189 Chronic kidney disease, unspecified: Secondary | ICD-10-CM | POA: Diagnosis not present

## 2023-09-05 DIAGNOSIS — Z992 Dependence on renal dialysis: Secondary | ICD-10-CM | POA: Diagnosis present

## 2023-09-05 DIAGNOSIS — D649 Anemia, unspecified: Secondary | ICD-10-CM | POA: Insufficient documentation

## 2023-09-05 DIAGNOSIS — I129 Hypertensive chronic kidney disease with stage 1 through stage 4 chronic kidney disease, or unspecified chronic kidney disease: Secondary | ICD-10-CM | POA: Diagnosis not present

## 2023-09-05 DIAGNOSIS — I2510 Atherosclerotic heart disease of native coronary artery without angina pectoris: Secondary | ICD-10-CM | POA: Insufficient documentation

## 2023-09-05 LAB — CBC WITH DIFFERENTIAL/PLATELET
Abs Immature Granulocytes: 0.11 10*3/uL — ABNORMAL HIGH (ref 0.00–0.07)
Basophils Absolute: 0.1 10*3/uL (ref 0.0–0.1)
Basophils Relative: 1 %
Eosinophils Absolute: 0.4 10*3/uL (ref 0.0–0.5)
Eosinophils Relative: 4 %
HCT: 32.6 % — ABNORMAL LOW (ref 36.0–46.0)
Hemoglobin: 10.1 g/dL — ABNORMAL LOW (ref 12.0–15.0)
Immature Granulocytes: 1 %
Lymphocytes Relative: 20 %
Lymphs Abs: 1.7 10*3/uL (ref 0.7–4.0)
MCH: 28.3 pg (ref 26.0–34.0)
MCHC: 31 g/dL (ref 30.0–36.0)
MCV: 91.3 fL (ref 80.0–100.0)
Monocytes Absolute: 0.8 10*3/uL (ref 0.1–1.0)
Monocytes Relative: 9 %
Neutro Abs: 5.9 10*3/uL (ref 1.7–7.7)
Neutrophils Relative %: 65 %
Platelets: 423 10*3/uL — ABNORMAL HIGH (ref 150–400)
RBC: 3.57 MIL/uL — ABNORMAL LOW (ref 3.87–5.11)
RDW: 15.9 % — ABNORMAL HIGH (ref 11.5–15.5)
WBC: 8.9 10*3/uL (ref 4.0–10.5)
nRBC: 0 % (ref 0.0–0.2)

## 2023-09-05 LAB — COMPREHENSIVE METABOLIC PANEL
ALT: 12 U/L (ref 0–44)
AST: 21 U/L (ref 15–41)
Albumin: 3.1 g/dL — ABNORMAL LOW (ref 3.5–5.0)
Alkaline Phosphatase: 108 U/L (ref 38–126)
Anion gap: 16 — ABNORMAL HIGH (ref 5–15)
BUN: 42 mg/dL — ABNORMAL HIGH (ref 8–23)
CO2: 21 mmol/L — ABNORMAL LOW (ref 22–32)
Calcium: 9.7 mg/dL (ref 8.9–10.3)
Chloride: 95 mmol/L — ABNORMAL LOW (ref 98–111)
Creatinine, Ser: 2.33 mg/dL — ABNORMAL HIGH (ref 0.44–1.00)
GFR, Estimated: 21 mL/min — ABNORMAL LOW (ref >60)
Glucose, Bld: 117 mg/dL — ABNORMAL HIGH (ref 70–99)
Potassium: 4.4 mmol/L (ref 3.5–5.1)
Sodium: 132 mmol/L — ABNORMAL LOW (ref 135–145)
Total Bilirubin: 0.5 mg/dL (ref 0.3–1.2)
Total Protein: 7.5 g/dL (ref 6.5–8.1)

## 2023-09-05 MED ORDER — OXYCODONE-ACETAMINOPHEN 5-325 MG PO TABS
1.0000 | ORAL_TABLET | Freq: Once | ORAL | Status: AC
  Administered 2023-09-05: 1 via ORAL
  Filled 2023-09-05: qty 1

## 2023-09-05 MED ORDER — MAGNESIUM CITRATE PO SOLN: 0.5000 | Freq: Once | ORAL | 0 refills | Status: AC

## 2023-09-05 NOTE — ED Notes (Signed)
Pt requesting home pain meds; provider notified.

## 2023-09-05 NOTE — Discharge Instructions (Signed)
The blood work looked good today.  The on-call nephrologist did not feel that dialysis was necessary today and does not think that she will need it on Saturday.  Please follow-up with him outpatient to get established.  His office is going to reach out to the care facility but I have also attached his contact information.  Patient's abdominal x-ray showed significant stool burden, so I have added magnesium citrate to her bowel regimen.

## 2023-09-05 NOTE — ED Notes (Signed)
Advised pt we need a urine sample, pt advised unable to provide one at this time.

## 2023-09-05 NOTE — ED Notes (Signed)
called to [facility] for pt transport to [facility]/rep:jessica.

## 2023-09-05 NOTE — ED Notes (Signed)
[place] RN at bedside and working on getting labs and a piv.

## 2023-09-05 NOTE — ED Notes (Addendum)
Per dayshift rn called lab around 1700 for draw d/t hard stick and they still haven't shown up. RN will try and find an [place] Rn. Provider aware.

## 2023-09-05 NOTE — ED Notes (Signed)
Pt aware we need a urine sample, but states she cannot go at this time.

## 2023-09-05 NOTE — ED Triage Notes (Signed)
Pt sts that she is from [facility] and is here for a Dialysis treatment. Pt is a T,Th,Sa schedule.

## 2023-09-05 NOTE — ED Notes (Signed)
ED Provider at bedside. 
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
## 2023-09-19 NOTE — Plan of Care (Signed)
[facility] Tonsillectomy/Adenoidectomy, Postoperative PEDS care plan entered in error.

## 2023-10-08 ENCOUNTER — Telehealth (INDEPENDENT_AMBULATORY_CARE_PROVIDER_SITE_OTHER): Payer: Self-pay

## 2023-10-08 NOTE — Telephone Encounter (Signed)
I attempted to contact the patient to schedule a permcath removal. I was unable as the voicemail box is full.
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
## 2023-11-06 ENCOUNTER — Telehealth (INDEPENDENT_AMBULATORY_CARE_PROVIDER_SITE_OTHER): Payer: Self-pay

## 2023-11-06 NOTE — Telephone Encounter (Signed)
A fax was received from [facility] for the patient to have a permcath removal. I attempted to contact the patient to schedule, the patient has a voicemail box that is full so unable to leave a message.

## 2023-12-09 ENCOUNTER — Telehealth (INDEPENDENT_AMBULATORY_CARE_PROVIDER_SITE_OTHER): Payer: Self-pay

## 2023-12-09 NOTE — Telephone Encounter (Signed)
I attempted to contact the patient to schedule a permcath removal. A message was left for a return call. 

## 2023-12-12 ENCOUNTER — Telehealth (INDEPENDENT_AMBULATORY_CARE_PROVIDER_SITE_OTHER): Payer: Self-pay

## 2023-12-12 NOTE — Telephone Encounter (Signed)
I received a fax from Toshia at [facility] to have a permcath removal for the patient. Patient is scheduled with Dr. Wyn Quaker on 12/23/23 with a 2:00 pm arrival time to the [facility]. Pre-procedure instructions will be faxed to Toshia at [facility].

## 2023-12-23 ENCOUNTER — Ambulatory Visit
Admission: RE | Admit: 2023-12-23 | Discharge: 2023-12-23 | Disposition: A | Discharge status: Home / Self Care | Payer: Medicare Other | Attending: Vascular Surgery | Admitting: Vascular Surgery

## 2023-12-23 ENCOUNTER — Encounter
Admission: RE | Disposition: A | Discharge status: Home / Self Care | Payer: Self-pay | Source: Home / Self Care | Attending: Vascular Surgery

## 2023-12-23 DIAGNOSIS — Z452 Encounter for adjustment and management of vascular access device: Secondary | ICD-10-CM

## 2023-12-23 DIAGNOSIS — E1122 Type 2 diabetes mellitus with diabetic chronic kidney disease: Secondary | ICD-10-CM | POA: Insufficient documentation

## 2023-12-23 DIAGNOSIS — Z4901 Encounter for fitting and adjustment of extracorporeal dialysis catheter: Secondary | ICD-10-CM | POA: Diagnosis present

## 2023-12-23 DIAGNOSIS — Z95828 Presence of other vascular implants and grafts: Secondary | ICD-10-CM | POA: Diagnosis not present

## 2023-12-23 DIAGNOSIS — Z992 Dependence on renal dialysis: Secondary | ICD-10-CM | POA: Insufficient documentation

## 2023-12-23 DIAGNOSIS — N186 End stage renal disease: Secondary | ICD-10-CM | POA: Insufficient documentation

## 2023-12-23 DIAGNOSIS — I120 Hypertensive chronic kidney disease with stage 5 chronic kidney disease or end stage renal disease: Secondary | ICD-10-CM | POA: Insufficient documentation

## 2023-12-23 HISTORY — PX: DIALYSIS/PERMA CATHETER REMOVAL: CATH118289

## 2023-12-23 SURGERY — DIALYSIS/PERMA CATHETER REMOVAL
Anesthesia: LOCAL

## 2023-12-23 MED ORDER — LIDOCAINE-EPINEPHRINE (PF) 1 %-1:200000 IJ SOLN
INTRAMUSCULAR | Status: DC | PRN
  Administered 2023-12-23: 20 mL via INTRADERMAL

## 2023-12-23 SURGICAL SUPPLY — 2 items
CHLORAPREP W/TINT 26 (MISCELLANEOUS) IMPLANT
TRAY LACERAT/PLASTIC (MISCELLANEOUS) IMPLANT

## 2023-12-23 NOTE — H&P (Signed)
[facility] Admission History & Physical  MRN : [id number]  Carrie Larsen is a 78 y.o. (1946-10-28) female who presents with chief complaint of No chief complaint on file. Marland Kitchen  History of Present Illness: I am asked to evaluate the patient by the dialysis center. The patient was sent here because they have a nonfunctioning tunneled catheter and a functioning permanent access.  The patient reports they're not been any problems with any of their dialysis runs. They are reporting good flows with good parameters at dialysis.   Patient denies pain or tenderness overlying the access.  There is no pain with dialysis.  The patient denies hand pain or finger pain consistent with steal syndrome.  No fevers or chills while on dialysis.    Current Facility-Administered Medications  Medication Dose Route Frequency Provider Last Rate Last Admin   lidocaine-EPINEPHrine (PF) (XYLOCAINE-EPINEPHrine) 1 %-1:200000 (PF) injection    PRN Annice Needy, MD   20 mL at 12/23/23 1433    Past Medical History:  Diagnosis Date   Anxiety    Arthritis    osteoarthritis   CAD (coronary artery disease)    a. 2012 ETT: no ischemia;  b. 06/2017 NSTEMI/PCI: LM nl, LAD nl, D1 70-80p, LCX nl, OM1 90 (3.0 x 23 Xience [place]), RCA dominant, 100p/m, fills via L->R collats, EF 55-65%.   Cancer [facility])    a. s/p partial left nephrectomy.   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 colon    Colon polyp 2011   COVID-19 07/2019   Diastolic dysfunction    a. 08/2015 Echo: EF 60-65%, no rwma, Gr1 DD, midlly dil LA, PASP . No significant valvular dzs; b. 06/2017 Echo: EF 60-65%, Gr1DD, mildly dil LA.   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   Obesity    Prediabetes    Pseudophakia of both eyes    Retinal cyst     Past Surgical History:  Procedure Laterality Date   ABDOMINAL HYSTERECTOMY     APPENDECTOMY  06/07/2015   CARDIAC CATHETERIZATION     CATARACT EXTRACTION W/PHACO Left 04/24/2016   Procedure: CATARACT EXTRACTION PHACO AND INTRAOCULAR LENS PLACEMENT (IOC);  Surgeon: Galen Manila, MD;  Location: [facility] ORS;  Service: Ophthalmology;  Laterality: Left;  [place] 45.8 AP% 16.4 CDE 7.53 FLUID PACK LOT # [id number]   CATARACT EXTRACTION W/PHACO Right 06/05/2016   Procedure: CATARACT EXTRACTION PHACO AND INTRAOCULAR LENS PLACEMENT (IOC);  Surgeon: Galen Manila, MD;  Location: [facility] ORS;  Service: Ophthalmology;  Laterality: Right;  [place] 25.9 AP% 21.9 CDE 5.68 Fluid pack lot # [id number]   COLON RESECTION  03/04/1991   COLON SURGERY     COLONOSCOPY  03-14-10   Dr Lemar Livings, tubular adenoma at 25 cm.   COLONOSCOPY WITH PROPOFOL N/A 06/07/2015   Procedure: COLONOSCOPY WITH PROPOFOL;  Surgeon: Earline Mayotte, MD;  Location: [facility] ENDOSCOPY;  Service: Endoscopy;  Laterality: N/A;   COLONOSCOPY WITH PROPOFOL N/A 08/10/2020   Procedure: COLONOSCOPY WITH PROPOFOL;  Surgeon: Earline Mayotte, MD;  Location: [facility]  ENDOSCOPY;  Service: Endoscopy;  Laterality: N/A;   CORONARY STENT INTERVENTION N/A 06/28/2017   Procedure: CORONARY STENT INTERVENTION;  Surgeon: Alwyn Pea, MD;  Location: [facility] INVASIVE CV LAB;  Service: Cardiovascular;  Laterality: N/A;   EYE SURGERY     lens eye surgery   HERNIA REPAIR  1993   umbilical   LEFT HEART CATH AND CORONARY ANGIOGRAPHY N/A 06/28/2017   Procedure: LEFT HEART CATH AND CORONARY ANGIOGRAPHY;  Surgeon: Antonieta Iba, MD;  Location: [facility] INVASIVE CV LAB;  Service: Cardiovascular;  Laterality: N/A;   LUMBAR LAMINECTOMY/DECOMPRESSION MICRODISCECTOMY Bilateral 03/10/2018    Procedure: Laminectomy and Foraminotomy - Lumbar three-Lumbar four - Lumbar four-Lumbar five - bilateral;  Surgeon: Julio Sicks, MD;  Location: [facility] OR;  Service: Neurosurgery;  Laterality: Bilateral;   NEPHRECTOMY  1992   left- cancer   PERIPHERAL VASCULAR THROMBECTOMY Right 10/01/2022   Procedure: PERIPHERAL VASCULAR THROMBECTOMY;  Surgeon: Annice Needy, MD;  Location: [facility] INVASIVE CV LAB;  Service: Cardiovascular;  Laterality: Right;   RIB RESECTION     removal 4 ribs   TONSILLECTOMY      Social History   Tobacco Use   Smoking status: Never    Passive exposure: Never   Smokeless tobacco: Never  Vaping Use   Vaping status: Never Used  Substance Use Topics   Alcohol use: No   Drug use: No     Family History  Problem Relation Age of Onset   Colon cancer Mother    Cerebral aneurysm Father    Heart attack Father    Heart attack Brother 65   Healthy Son     No family history of bleeding or clotting disorders, autoimmune disease or porphyria  Allergies  Allergen Reactions   Morphine And Codeine Nausea Only   Prednisone Nausea And Vomiting   Amlodipine Palpitations   Latex Rash     REVIEW OF SYSTEMS (Negative unless checked)  Constitutional: [] Weight loss  [] Fever  [] Chills Cardiac: [] Chest pain   [] Chest pressure   [] Palpitations   [] Shortness of breath when laying flat   [] Shortness of breath at rest   [x] Shortness of breath with exertion. Vascular:  [] Pain in legs with walking   [] Pain in legs at rest   [] Pain in legs when laying flat   [] Claudication   [] Pain in feet when walking  [] Pain in feet at rest  [] Pain in feet when laying flat   [] History of DVT   [] Phlebitis   [] Swelling in legs   [] Varicose veins   [] Non-healing ulcers Pulmonary:   [] Uses home oxygen   [] Productive cough   [] Hemoptysis   [] Wheeze  [] COPD   [] Asthma Neurologic:  [] Dizziness  [] Blackouts   [] Seizures   [] History of stroke   [] History of TIA  [] Aphasia   [] Temporary blindness   [] Dysphagia    [] Weakness or numbness in arms   [] Weakness or numbness in legs Musculoskeletal:  [] Arthritis   [] Joint swelling   [] Joint pain   [] Low back pain Hematologic:  [] Easy bruising  [] Easy bleeding   [] Hypercoagulable state   [x] Anemic  [] Hepatitis Gastrointestinal:  [] Blood in stool   [] Vomiting blood  [] Gastroesophageal reflux/heartburn   [] Difficulty swallowing. Genitourinary:  [x] Chronic kidney disease   [] Difficult urination  [] Frequent urination  [] Burning with urination   [] Blood in urine Skin:  [] Rashes   [] Ulcers   [] Wounds Psychological:  [] History of anxiety   []  History of major depression.  Physical Examination  Vitals:   12/23/23 1419  BP: Marland Kitchen)  151/77  Pulse: (!) 101  Temp: 97.9 F (36.6 C)  TempSrc: Oral  SpO2: 96%   There is no height or weight on file to calculate BMI. Gen: WD/WN, NAD Head: [place]/AT, No temporalis wasting.  Ear/Nose/Throat: Hearing grossly intact, nares w/o erythema or drainage, oropharynx w/o Erythema/Exudate,  Eyes: Conjunctiva clear, sclera non-icteric Neck: Trachea midline.  No JVD.  Pulmonary:  Good air movement, respirations not labored, no use of accessory muscles.  Cardiac: RRR, normal S1, S2. Vascular:  Vessel Right Left  Radial Palpable Palpable   Musculoskeletal: M/S 5/5 throughout.  Extremities without ischemic changes.  No deformity or atrophy.  Neurologic: Sensation grossly intact in extremities.  Symmetrical.  Speech is fluent. Motor exam as listed above. Psychiatric: Judgment intact, Mood & affect appropriate for pt's clinical situation. Dermatologic: No rashes or ulcers noted.  No cellulitis or open wounds.    CBC Lab Results  Component Value Date   WBC 8.9 09/05/2023   HGB 10.1 (L) 09/05/2023   HCT 32.6 (L) 09/05/2023   MCV 91.3 09/05/2023   PLT 423 (H) 09/05/2023    BMET    Component Value Date/Time   NA 132 (L) 09/05/2023 1942   NA 146 (H) 08/23/2022 1311   NA 141 04/30/2014 2330   K 4.4 09/05/2023 1942   K 4.4  04/30/2014 2330   CL 95 (L) 09/05/2023 1942   CL 108 (H) 04/30/2014 2330   CO2 21 (L) 09/05/2023 1942   CO2 30 04/30/2014 2330   GLUCOSE 117 (H) 09/05/2023 1942   GLUCOSE 92 04/30/2014 2330   BUN 42 (H) 09/05/2023 1942   BUN 20 08/23/2022 1311   BUN 14 04/30/2014 2330   CREATININE 2.33 (H) 09/05/2023 1942   CREATININE 0.96 08/09/2022 1034   CALCIUM 9.7 09/05/2023 1942   CALCIUM 9.6 04/30/2014 2330   GFRNONAA 21 (L) 09/05/2023 1942   GFRNONAA 54 (L) 04/25/2021 1437   GFRAA 63 04/25/2021 1437   CrCl cannot be calculated (Patient's most recent lab result is older than the maximum 21 days allowed.).  COAG Lab Results  Component Value Date   INR 0.97 06/27/2017    Radiology PERIPHERAL VASCULAR CATHETERIZATION Result Date: 12/23/2023 See surgical note for result.   Assessment/Plan 1.  Complication dialysis device with thrombosis AV access:  Patient's  Tunneled catheter is not being used. The patient has an extremity access that is functioning well. Therefore, the patient will undergo removal of the tunneled catheter under local anesthesia.  The risks and benefits were described to the patient.  All questions were answered.  The patient agrees to proceed with angiography and intervention. Potassium will be drawn to ensure that it is an appropriate level prior to performing intervention. 2.  End-stage renal disease requiring hemodialysis:  Patient will continue dialysis therapy without further interruption if a successful intervention is not achieved then a tunneled catheter will be placed. Dialysis has already been arranged. 3.  Hypertension:  Patient will continue medical management; nephrology is following no changes in oral medications. 4. Diabetes mellitus:  Glucose will be monitored and oral medications been held this morning once the patient has undergone the patient's procedure po intake will be reinitiated and again Accu-Cheks will be used to assess the blood glucose level and  treat as needed. The patient will be restarted on the patient's usual hypoglycemic regime     Festus Barren, MD  12/23/2023 2:38 PM

## 2023-12-23 NOTE — Op Note (Signed)
Operative Note     Preoperative diagnosis:   1. ESRD with functional permanent access  Postoperative diagnosis:  1. ESRD with functional permanent access  Procedure:  Removal of right jugular Permcath  Surgeon:  Festus Barren, MD  Assistant: Rolla Plate, NP  Anesthesia:  Local  EBL:  Minimal  Indication for the Procedure:  The patient has a functional permanent dialysis access and no longer needs their permcath.  This can be removed.  Risks and benefits are discussed and informed consent is obtained.  Description of the Procedure:  The patient's right neck, chest and existing catheter were sterilely prepped and draped. The area around the catheter was anesthetized copiously with 1% lidocaine. The catheter was dissected out with curved hemostats until the cuff was freed from the surrounding fibrous sheath. The fiber sheath was transected, and the catheter was then removed in its entirety using gentle traction. Pressure was held and sterile dressings were placed. The patient tolerated the procedure well and was taken to the recovery room in stable condition.     Festus Barren  12/23/2023, 3:03 PM This note was created with Dragon Medical transcription system. Any errors in dictation are purely unintentional.

## 2023-12-23 NOTE — Discharge Instructions (Signed)
Tunneled Catheter Removal, Care After Refer to this sheet in the next few weeks. These instructions provide you with information about caring for yourself after your procedure. Your health care provider may also give you more specific instructions. Your treatment has been planned according to current medical practices, but problems sometimes occur. Call your health care provider if you have any problems or questions after your procedure. What can I expect after the procedure? After the procedure, it is common to have: Some mild redness, swelling, and pain around your catheter site.   Follow these instructions at home: Incision care  Check your removal site  every day for signs of infection. Check for: More redness, swelling, or pain. More fluid or blood. Warmth. Pus or a bad smell. Remove your dressing in 48hrs leave open to air  Activity  Return to your normal activities as told by your health care provider. Ask your health care provider what activities are safe for you. Do not lift anything that is heavier than 10 lb (4.5 kg) for 3 days  You may shower tomorrow  Contact a health care provider if: You have more fluid or blood coming from your removal site You have more redness, swelling, or pain at your incisions or around the area where your catheter was removed Your removal site feel warm to the touch. You feel unusually weak. You feel nauseous.. Get help right away if You have swelling in your arm, shoulder, neck, or face. You develop chest pain. You have difficulty breathing. You feel dizzy or light-headed. You have pus or a bad smell coming from your removal site You have a fever. You develop bleeding from your removal site, and your bleeding does not stop. This information is not intended to replace advice given to you by your health care provider. Make sure you discuss any questions you have with your health care provider. Document Released: 10/29/2012 Document Revised:  07/15/2016 Document Reviewed: 08/08/2015 Elsevier Interactive Patient Education  2017 [employer].

## 2023-12-24 ENCOUNTER — Encounter: Payer: Self-pay | Admitting: Vascular Surgery

## 2024-01-28 ENCOUNTER — Ambulatory Visit: Admitting: Family Medicine

## 2024-01-30 ENCOUNTER — Ambulatory Visit: Admitting: Family Medicine

## 2024-03-25 ENCOUNTER — Encounter (INDEPENDENT_AMBULATORY_CARE_PROVIDER_SITE_OTHER): Payer: Medicare Other

## 2024-03-25 ENCOUNTER — Ambulatory Visit (INDEPENDENT_AMBULATORY_CARE_PROVIDER_SITE_OTHER): Payer: Medicare Other | Admitting: Nurse Practitioner

## 2024-04-14 ENCOUNTER — Encounter (INDEPENDENT_AMBULATORY_CARE_PROVIDER_SITE_OTHER): Payer: Self-pay

## 2024-05-06 ENCOUNTER — Encounter: Payer: Self-pay | Admitting: Internal Medicine

## 2024-05-06 ENCOUNTER — Inpatient Hospital Stay
Admission: EM | Admit: 2024-05-06 | Discharge: 2024-05-15 | DRG: 689 | Disposition: A | Discharge status: Skilled Nursing Facility | Source: Skilled Nursing Facility | Attending: Internal Medicine | Admitting: Internal Medicine

## 2024-05-06 ENCOUNTER — Emergency Department

## 2024-05-06 ENCOUNTER — Other Ambulatory Visit: Payer: Self-pay

## 2024-05-06 DIAGNOSIS — R4182 Altered mental status, unspecified: Secondary | ICD-10-CM

## 2024-05-06 DIAGNOSIS — R1084 Generalized abdominal pain: Principal | ICD-10-CM

## 2024-05-06 DIAGNOSIS — I2510 Atherosclerotic heart disease of native coronary artery without angina pectoris: Secondary | ICD-10-CM | POA: Diagnosis present

## 2024-05-06 DIAGNOSIS — J189 Pneumonia, unspecified organism: Secondary | ICD-10-CM | POA: Diagnosis present

## 2024-05-06 DIAGNOSIS — B964 Proteus (mirabilis) (morganii) as the cause of diseases classified elsewhere: Secondary | ICD-10-CM | POA: Diagnosis present

## 2024-05-06 DIAGNOSIS — J440 Chronic obstructive pulmonary disease with acute lower respiratory infection: Secondary | ICD-10-CM | POA: Diagnosis present

## 2024-05-06 DIAGNOSIS — R262 Difficulty in walking, not elsewhere classified: Secondary | ICD-10-CM | POA: Diagnosis present

## 2024-05-06 DIAGNOSIS — Z7901 Long term (current) use of anticoagulants: Secondary | ICD-10-CM

## 2024-05-06 DIAGNOSIS — Z85038 Personal history of other malignant neoplasm of large intestine: Secondary | ICD-10-CM

## 2024-05-06 DIAGNOSIS — R159 Full incontinence of feces: Secondary | ICD-10-CM | POA: Diagnosis present

## 2024-05-06 DIAGNOSIS — N3001 Acute cystitis with hematuria: Secondary | ICD-10-CM | POA: Diagnosis not present

## 2024-05-06 DIAGNOSIS — I10 Essential (primary) hypertension: Secondary | ICD-10-CM | POA: Diagnosis present

## 2024-05-06 DIAGNOSIS — Z9049 Acquired absence of other specified parts of digestive tract: Secondary | ICD-10-CM

## 2024-05-06 DIAGNOSIS — Z6830 Body mass index (BMI) 30.0-30.9, adult: Secondary | ICD-10-CM

## 2024-05-06 DIAGNOSIS — E876 Hypokalemia: Secondary | ICD-10-CM | POA: Diagnosis not present

## 2024-05-06 DIAGNOSIS — Z961 Presence of intraocular lens: Secondary | ICD-10-CM | POA: Diagnosis present

## 2024-05-06 DIAGNOSIS — Z8249 Family history of ischemic heart disease and other diseases of the circulatory system: Secondary | ICD-10-CM

## 2024-05-06 DIAGNOSIS — R197 Diarrhea, unspecified: Secondary | ICD-10-CM | POA: Diagnosis present

## 2024-05-06 DIAGNOSIS — Z800 Family history of malignant neoplasm of digestive organs: Secondary | ICD-10-CM

## 2024-05-06 DIAGNOSIS — E785 Hyperlipidemia, unspecified: Secondary | ICD-10-CM | POA: Diagnosis present

## 2024-05-06 DIAGNOSIS — N390 Urinary tract infection, site not specified: Secondary | ICD-10-CM | POA: Diagnosis not present

## 2024-05-06 DIAGNOSIS — I25118 Atherosclerotic heart disease of native coronary artery with other forms of angina pectoris: Secondary | ICD-10-CM | POA: Diagnosis present

## 2024-05-06 DIAGNOSIS — R109 Unspecified abdominal pain: Secondary | ICD-10-CM | POA: Diagnosis present

## 2024-05-06 DIAGNOSIS — Z7401 Bed confinement status: Secondary | ICD-10-CM

## 2024-05-06 DIAGNOSIS — Z85528 Personal history of other malignant neoplasm of kidney: Secondary | ICD-10-CM

## 2024-05-06 DIAGNOSIS — Z905 Acquired absence of kidney: Secondary | ICD-10-CM

## 2024-05-06 DIAGNOSIS — H353131 Nonexudative age-related macular degeneration, bilateral, early dry stage: Secondary | ICD-10-CM | POA: Diagnosis present

## 2024-05-06 DIAGNOSIS — Z91040 Latex allergy status: Secondary | ICD-10-CM

## 2024-05-06 DIAGNOSIS — I252 Old myocardial infarction: Secondary | ICD-10-CM

## 2024-05-06 DIAGNOSIS — I110 Hypertensive heart disease with heart failure: Secondary | ICD-10-CM | POA: Diagnosis present

## 2024-05-06 DIAGNOSIS — I5032 Chronic diastolic (congestive) heart failure: Secondary | ICD-10-CM | POA: Diagnosis present

## 2024-05-06 DIAGNOSIS — Z86718 Personal history of other venous thrombosis and embolism: Secondary | ICD-10-CM

## 2024-05-06 DIAGNOSIS — Z860100 Personal history of colon polyps, unspecified: Secondary | ICD-10-CM

## 2024-05-06 DIAGNOSIS — Z87440 Personal history of urinary (tract) infections: Secondary | ICD-10-CM

## 2024-05-06 DIAGNOSIS — Z66 Do not resuscitate: Secondary | ICD-10-CM | POA: Diagnosis present

## 2024-05-06 DIAGNOSIS — Z9842 Cataract extraction status, left eye: Secondary | ICD-10-CM

## 2024-05-06 DIAGNOSIS — Z8616 Personal history of COVID-19: Secondary | ICD-10-CM

## 2024-05-06 DIAGNOSIS — Z955 Presence of coronary angioplasty implant and graft: Secondary | ICD-10-CM

## 2024-05-06 DIAGNOSIS — Z1623 Resistance to quinolones and fluoroquinolones: Secondary | ICD-10-CM | POA: Diagnosis present

## 2024-05-06 DIAGNOSIS — K5289 Other specified noninfective gastroenteritis and colitis: Secondary | ICD-10-CM | POA: Diagnosis present

## 2024-05-06 DIAGNOSIS — Z79899 Other long term (current) drug therapy: Secondary | ICD-10-CM

## 2024-05-06 DIAGNOSIS — Z888 Allergy status to other drugs, medicaments and biological substances status: Secondary | ICD-10-CM

## 2024-05-06 DIAGNOSIS — M109 Gout, unspecified: Secondary | ICD-10-CM | POA: Diagnosis present

## 2024-05-06 DIAGNOSIS — E669 Obesity, unspecified: Secondary | ICD-10-CM | POA: Diagnosis present

## 2024-05-06 DIAGNOSIS — M48061 Spinal stenosis, lumbar region without neurogenic claudication: Secondary | ICD-10-CM | POA: Diagnosis present

## 2024-05-06 DIAGNOSIS — K5641 Fecal impaction: Secondary | ICD-10-CM

## 2024-05-06 DIAGNOSIS — K219 Gastro-esophageal reflux disease without esophagitis: Secondary | ICD-10-CM | POA: Diagnosis present

## 2024-05-06 DIAGNOSIS — Z87442 Personal history of urinary calculi: Secondary | ICD-10-CM

## 2024-05-06 DIAGNOSIS — Z885 Allergy status to narcotic agent status: Secondary | ICD-10-CM

## 2024-05-06 DIAGNOSIS — Z9841 Cataract extraction status, right eye: Secondary | ICD-10-CM

## 2024-05-06 DIAGNOSIS — K5900 Constipation, unspecified: Secondary | ICD-10-CM | POA: Diagnosis present

## 2024-05-06 DIAGNOSIS — Z90710 Acquired absence of both cervix and uterus: Secondary | ICD-10-CM

## 2024-05-06 LAB — URINALYSIS, W/ REFLEX TO CULTURE (INFECTION SUSPECTED)
Bacteria, UA: NONE SEEN
Bilirubin Urine: NEGATIVE
Glucose, UA: NEGATIVE mg/dL
Ketones, ur: NEGATIVE mg/dL
Nitrite: NEGATIVE
Protein, ur: 100 mg/dL — AB
RBC / HPF: >50 RBC/hpf (ref 0–5)
Specific Gravity, Urine: 1.013 (ref 1.005–1.030)
Squamous Epithelial / HPF: 0 /HPF (ref 0–5)
WBC, UA: >50 WBC/hpf (ref 0–5)
pH: 5 (ref 5.0–8.0)

## 2024-05-06 LAB — CBC WITH DIFFERENTIAL/PLATELET
Abs Immature Granulocytes: 0.06 10*3/uL (ref 0.00–0.07)
Basophils Absolute: 0.1 10*3/uL (ref 0.0–0.1)
Basophils Relative: 1 %
Eosinophils Absolute: 0.5 10*3/uL (ref 0.0–0.5)
Eosinophils Relative: 5 %
HCT: 39.3 % (ref 36.0–46.0)
Hemoglobin: 12.7 g/dL (ref 12.0–15.0)
Immature Granulocytes: 1 %
Lymphocytes Relative: 27 %
Lymphs Abs: 2.6 10*3/uL (ref 0.7–4.0)
MCH: 29.3 pg (ref 26.0–34.0)
MCHC: 32.3 g/dL (ref 30.0–36.0)
MCV: 90.8 fL (ref 80.0–100.0)
Monocytes Absolute: 0.7 10*3/uL (ref 0.1–1.0)
Monocytes Relative: 7 %
Neutro Abs: 5.7 10*3/uL (ref 1.7–7.7)
Neutrophils Relative %: 59 %
Platelets: 358 10*3/uL (ref 150–400)
RBC: 4.33 MIL/uL (ref 3.87–5.11)
RDW: 13.5 % (ref 11.5–15.5)
WBC: 9.7 10*3/uL (ref 4.0–10.5)
nRBC: 0 % (ref 0.0–0.2)

## 2024-05-06 LAB — COMPREHENSIVE METABOLIC PANEL WITH GFR
ALT: 14 U/L (ref 0–44)
AST: 21 U/L (ref 15–41)
Albumin: 3.5 g/dL (ref 3.5–5.0)
Alkaline Phosphatase: 89 U/L (ref 38–126)
Anion gap: 11 (ref 5–15)
BUN: 35 mg/dL — ABNORMAL HIGH (ref 8–23)
CO2: 24 mmol/L (ref 22–32)
Calcium: 10.5 mg/dL — ABNORMAL HIGH (ref 8.9–10.3)
Chloride: 103 mmol/L (ref 98–111)
Creatinine, Ser: 1.28 mg/dL — ABNORMAL HIGH (ref 0.44–1.00)
GFR, Estimated: 43 mL/min — ABNORMAL LOW (ref >60)
Glucose, Bld: 114 mg/dL — ABNORMAL HIGH (ref 70–99)
Potassium: 3.3 mmol/L — ABNORMAL LOW (ref 3.5–5.1)
Sodium: 138 mmol/L (ref 135–145)
Total Bilirubin: 0.5 mg/dL (ref 0.0–1.2)
Total Protein: 6.7 g/dL (ref 6.5–8.1)

## 2024-05-06 LAB — TROPONIN I (HIGH SENSITIVITY)
Troponin I (High Sensitivity): 11 ng/L (ref <18)
Troponin I (High Sensitivity): 11 ng/L (ref <18)

## 2024-05-06 LAB — LACTIC ACID, PLASMA: Lactic Acid, Venous: 1.6 mmol/L (ref 0.5–1.9)

## 2024-05-06 LAB — GLUCOSE, CAPILLARY
Glucose-Capillary: 159 mg/dL — ABNORMAL HIGH (ref 70–99)
Glucose-Capillary: 118 mg/dL — ABNORMAL HIGH (ref 70–99)
Glucose-Capillary: 122 mg/dL — ABNORMAL HIGH (ref 70–99)

## 2024-05-06 LAB — HEMOGLOBIN A1C
Hgb A1c MFr Bld: 5.6 % (ref 4.8–5.6)
Mean Plasma Glucose: 114 mg/dL

## 2024-05-06 MED ORDER — FENTANYL CITRATE PF 50 MCG/ML IJ SOSY
50.0000 ug | PREFILLED_SYRINGE | Freq: Once | INTRAMUSCULAR | Status: AC
  Administered 2024-05-06: 50 ug via INTRAVENOUS
  Filled 2024-05-06: qty 1

## 2024-05-06 MED ORDER — SENNOSIDES-DOCUSATE SODIUM 8.6-50 MG PO TABS
2.0000 | ORAL_TABLET | Freq: Every day | ORAL | Status: DC
  Administered 2024-05-08 – 2024-05-13 (×5): 2 via ORAL
  Filled 2024-05-06 (×8): qty 2

## 2024-05-06 MED ORDER — LOPERAMIDE HCL 2 MG PO CAPS: 2.0000 mg | ORAL_CAPSULE | Freq: Four times a day (QID) | ORAL | Status: DC | PRN

## 2024-05-06 MED ORDER — SODIUM CHLORIDE 0.9 % IV SOLN
1.0000 g | Freq: Once | INTRAVENOUS | Status: AC
  Administered 2024-05-06: 1 g via INTRAVENOUS
  Filled 2024-05-06: qty 20

## 2024-05-06 MED ORDER — HYDROCODONE-ACETAMINOPHEN 5-325 MG PO TABS
1.0000 | ORAL_TABLET | Freq: Four times a day (QID) | ORAL | Status: DC
  Administered 2024-05-06 – 2024-05-07 (×6): 1 via ORAL
  Filled 2024-05-06 (×6): qty 1

## 2024-05-06 MED ORDER — POLYETHYLENE GLYCOL 3350 17 G PO PACK
17.0000 g | PACK | Freq: Every day | ORAL | Status: DC
  Administered 2024-05-08: 17 g via ORAL
  Filled 2024-05-06: qty 1

## 2024-05-06 MED ORDER — LAMOTRIGINE 150 MG PO TABS
150.0000 mg | ORAL_TABLET | Freq: Every day | ORAL | Status: DC
  Administered 2024-05-06 – 2024-05-14 (×9): 150 mg via ORAL
  Filled 2024-05-06 (×8): qty 6
  Filled 2024-05-06: qty 1
  Filled 2024-05-06: qty 6

## 2024-05-06 MED ORDER — ACETAMINOPHEN 325 MG PO TABS
650.0000 mg | ORAL_TABLET | Freq: Four times a day (QID) | ORAL | Status: DC | PRN
  Administered 2024-05-08: 650 mg via ORAL
  Filled 2024-05-06: qty 2

## 2024-05-06 MED ORDER — SODIUM CHLORIDE 0.9 % IV SOLN
1.0000 g | Freq: Two times a day (BID) | INTRAVENOUS | Status: DC
  Administered 2024-05-06 – 2024-05-09 (×7): 1 g via INTRAVENOUS
  Filled 2024-05-06 (×9): qty 20

## 2024-05-06 MED ORDER — APIXABAN 2.5 MG PO TABS
2.5000 mg | ORAL_TABLET | Freq: Two times a day (BID) | ORAL | Status: DC
  Administered 2024-05-06 – 2024-05-15 (×15): 2.5 mg via ORAL
  Filled 2024-05-06 (×19): qty 1

## 2024-05-06 MED ORDER — ALLOPURINOL 100 MG PO TABS: 200.0000 mg | ORAL_TABLET | Freq: Every day | ORAL | Status: DC

## 2024-05-06 MED ORDER — PANTOPRAZOLE SODIUM 40 MG PO TBEC
40.0000 mg | DELAYED_RELEASE_TABLET | Freq: Every day | ORAL | Status: DC
  Administered 2024-05-06 – 2024-05-15 (×8): 40 mg via ORAL
  Filled 2024-05-06 (×10): qty 1

## 2024-05-06 MED ORDER — ACETAMINOPHEN 650 MG RE SUPP
650.0000 mg | Freq: Four times a day (QID) | RECTAL | Status: DC | PRN
Start: 2024-05-06 — End: 2024-05-15

## 2024-05-06 MED ORDER — ONDANSETRON HCL 4 MG PO TABS
4.0000 mg | ORAL_TABLET | Freq: Four times a day (QID) | ORAL | Status: DC | PRN
  Filled 2024-05-06: qty 1

## 2024-05-06 MED ORDER — POTASSIUM CHLORIDE CRYS ER 20 MEQ PO TBCR
40.0000 meq | EXTENDED_RELEASE_TABLET | Freq: Once | ORAL | Status: AC
  Administered 2024-05-06: 40 meq via ORAL
  Filled 2024-05-06: qty 2

## 2024-05-06 MED ORDER — ONDANSETRON HCL 4 MG/2ML IJ SOLN
4.0000 mg | Freq: Once | INTRAMUSCULAR | Status: AC
  Administered 2024-05-06: 4 mg via INTRAVENOUS
  Filled 2024-05-06: qty 2

## 2024-05-06 MED ORDER — ATORVASTATIN CALCIUM 80 MG PO TABS
80.0000 mg | ORAL_TABLET | Freq: Every day | ORAL | Status: DC
  Administered 2024-05-06 – 2024-05-15 (×7): 80 mg via ORAL
  Filled 2024-05-06 (×10): qty 1

## 2024-05-06 MED ORDER — SIMETHICONE 80 MG PO CHEW
80.0000 mg | CHEWABLE_TABLET | Freq: Every day | ORAL | Status: DC
  Administered 2024-05-06 – 2024-05-15 (×8): 80 mg via ORAL
  Filled 2024-05-06 (×11): qty 1

## 2024-05-06 MED ORDER — SODIUM CHLORIDE 0.9 % IV BOLUS
1000.0000 mL | Freq: Once | INTRAVENOUS | Status: AC
  Administered 2024-05-06: 1000 mL via INTRAVENOUS

## 2024-05-06 MED ORDER — ONDANSETRON HCL 4 MG/2ML IJ SOLN: 4.0000 mg | Freq: Four times a day (QID) | INTRAMUSCULAR | Status: DC | PRN

## 2024-05-06 MED ORDER — INSULIN ASPART 100 UNIT/ML IJ SOLN
0.0000 [IU] | Freq: Three times a day (TID) | INTRAMUSCULAR | Status: DC
  Administered 2024-05-06 – 2024-05-09 (×2): 3 [IU] via SUBCUTANEOUS
  Administered 2024-05-10 – 2024-05-13 (×3): 2 [IU] via SUBCUTANEOUS
  Filled 2024-05-06 (×6): qty 1

## 2024-05-06 MED ORDER — POTASSIUM CHLORIDE 10 MEQ/100ML IV SOLN
10.0000 meq | Freq: Once | INTRAVENOUS | Status: AC
  Administered 2024-05-06: 10 meq via INTRAVENOUS
  Filled 2024-05-06: qty 100

## 2024-05-06 NOTE — ED Provider Notes (Signed)
 [facility] Provider Note    Event Date/Time   First MD Initiated Contact with Patient 05/06/24 [phone]     (approximate)   History   Urinary Tract Infection and Abdominal Pain   HPI  Carrie Larsen is a 78 y.o. female brought to the ED via EMS from SNF with a chief complaint of abdominal and back pain and burning with urination.  Patient was being treated for UTI with Macrobid .  Cultures came back and patient was switched to IM ertapenem.  Complains of increasing abdominal/flank pain as well as urinary discomfort.  Denies fever/chills, chest pain, shortness of breath, nausea, vomiting or dizziness.  Patient is bedbound at baseline.     Past Medical History   Past Medical History:  Diagnosis Date   Anxiety    Arthritis    osteoarthritis   CAD (coronary artery disease)    a. 2012 ETT: no ischemia;  b. 06/2017 NSTEMI/PCI: LM nl, LAD nl, D1 70-80p, LCX nl, OM1 90 (3.0 x 23 Xience [place]), RCA dominant, 100p/m, fills via L->R collats, EF 55-65%.   Cancer [facility])    a. s/p partial left nephrectomy.   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 colon    Colon polyp 2011   COVID-19 07/2019   Diastolic dysfunction    a. 08/2015 Echo: EF 60-65%, no rwma, Gr1 DD, midlly dil LA, PASP . No significant valvular dzs; b. 06/2017 Echo: EF 60-65%, Gr1DD, mildly dil LA.   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   Obesity    Prediabetes    Pseudophakia of both eyes    Retinal cyst      Active Problem List   Patient Active Problem List   Diagnosis Date Noted   Pain in limb  03/26/2023   Degenerative spondylolisthesis 10/11/2022   DVT (deep venous thrombosis) (HCC) 09/25/2022   Elevated blood sugar 08/09/2022   Gout involving toe 08/09/2022   Coronary artery disease of native artery of native heart with stable angina pectoris (HCC) 07/26/2022   Class 2 severe obesity with serious comorbidity and body mass index (BMI) of 38.0 to 38.9 in adult (HCC) 07/26/2022   Pain in left lower leg 07/26/2022   Lower limb pain, inferior, left 05/30/2022   Open wound of left lower leg due to cat bite 04/24/2022   Injury of left lower leg 04/01/2022   Osteoarthritis of left knee 10/05/2021   Pain in joint of left knee 10/05/2021   Personal history of kidney stones 12/25/2019   Choroidal nevus, 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   CME (cystoid macular edema), left 11/20/2018   Nonexudative age-related macular degeneration, bilateral, early dry stage 11/20/2018   Pseudophakia of both eyes 11/20/2018   Olecranon bursitis 10/16/2018   Contusion of knee 10/16/2018   Arthritis of knee 10/16/2018   Arthritis of hand 10/16/2018   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/10/2018   Obesity 09/11/2017   Abdominal pain 02/28/2017   History of renal cell carcinoma 02/20/2017   Knee pain 08/14/2016   Strain of  hamstring muscle 08/08/2016   Left leg swelling 08/22/2015   Systolic murmur    Retrosternal chest pain 07/08/2015   History of colonic polyps 04/16/2015   Chest pain 03/08/2011   Hyperlipidemia 03/08/2011   HTN (hypertension) 03/08/2011     Past Surgical History   Past Surgical History:  Procedure Laterality Date   ABDOMINAL HYSTERECTOMY     APPENDECTOMY  06/07/2015   CARDIAC CATHETERIZATION     CATARACT EXTRACTION W/PHACO Left 04/24/2016   Procedure: CATARACT EXTRACTION PHACO AND INTRAOCULAR LENS PLACEMENT (IOC);  Surgeon: Clair Crews, MD;  Location: [facility] ORS;  Service: Ophthalmology;  Laterality: Left;  US  45.8 AP% 16.4 CDE 7.53 FLUID PACK LOT # [id number]    CATARACT EXTRACTION W/PHACO Right 06/05/2016   Procedure: CATARACT EXTRACTION PHACO AND INTRAOCULAR LENS PLACEMENT (IOC);  Surgeon: Clair Crews, MD;  Location: [facility] ORS;  Service: Ophthalmology;  Laterality: Right;  US  25.9 AP% 21.9 CDE 5.68 Fluid pack lot # [id number]   COLON RESECTION  03/04/1991   COLON SURGERY     COLONOSCOPY  03-14-10   Dr Marquita Situ, tubular adenoma at 25 cm.   COLONOSCOPY WITH PROPOFOL  N/A 06/07/2015   Procedure: COLONOSCOPY WITH PROPOFOL ;  Surgeon: [name] Skeeter, MD;  Location: [facility] ENDOSCOPY;  Service: Endoscopy;  Laterality: N/A;   COLONOSCOPY WITH PROPOFOL  N/A 08/10/2020   Procedure: COLONOSCOPY WITH PROPOFOL ;  Surgeon: [name] Skeeter, MD;  Location: [facility] ENDOSCOPY;  Service: Endoscopy;  Laterality: N/A;   CORONARY STENT INTERVENTION N/A 06/28/2017   Procedure: CORONARY STENT INTERVENTION;  Surgeon: Antonette Batters, MD;  Location: [facility] INVASIVE CV LAB;  Service: Cardiovascular;  Laterality: N/A;   DIALYSIS/PERMA CATHETER REMOVAL N/A 12/23/2023   Procedure: DIALYSIS/PERMA CATHETER REMOVAL;  Surgeon: Celso College, MD;  Location: [facility] INVASIVE CV LAB;  Service: Cardiovascular;  Laterality: N/A;   EYE SURGERY     lens eye surgery   HERNIA REPAIR  1993   umbilical   LEFT HEART CATH AND CORONARY ANGIOGRAPHY N/A 06/28/2017   Procedure: LEFT HEART CATH AND CORONARY ANGIOGRAPHY;  Surgeon: Devorah Fonder, MD;  Location: [facility] INVASIVE CV LAB;  Service: Cardiovascular;  Laterality: N/A;   LUMBAR LAMINECTOMY/DECOMPRESSION MICRODISCECTOMY Bilateral 03/10/2018   Procedure: Laminectomy and Foraminotomy - Lumbar three-Lumbar four - Lumbar four-Lumbar five - bilateral;  Surgeon: Agustina Aldrich, MD;  Location: [facility] OR;  Service: Neurosurgery;  Laterality: Bilateral;   NEPHRECTOMY  1992   left- cancer   PERIPHERAL VASCULAR THROMBECTOMY Right 10/01/2022   Procedure: PERIPHERAL VASCULAR THROMBECTOMY;  Surgeon: Celso College, MD;  Location: [facility] INVASIVE CV LAB;  Service:  Cardiovascular;  Laterality: Right;   RIB RESECTION     removal 4 ribs   TONSILLECTOMY       Home Medications   Prior to Admission medications   Medication Sig Start Date End Date Taking? Authorizing Provider  allopurinol  (ZYLOPRIM ) 100 MG tablet Take 2 tablets (200 mg total) by mouth daily. 01/14/23   Karamalegos, Kayleen Party, DO  apixaban  (ELIQUIS ) 2.5 MG TABS tablet Take 1 tablet (2.5 mg total) by mouth 2 (two) times daily. 04/11/23   Brown, Fallon E, NP  atorvastatin  (LIPITOR ) 80 MG tablet Take 1 tablet by mouth once daily 11/08/22   Gollan, Timothy J, MD  carvedilol  (COREG ) 25 MG tablet Take 1 tablet by mouth twice daily 03/11/23   Furth, Cadence H, PA-C  colchicine  0.6 MG tablet Take 1 tablet (0.6 mg total) by mouth daily. Take 1 tablet TID until GI upset or flare subsides, then for  maintenance, take 1 tablet daily. Patient taking differently: Take 0.6 mg by mouth daily. Take 1 tablet TID until GI upset or flare subsides, then for maintenance, take 1 tablet daily. Every other day. 08/22/22   Hyatt, Max T, DPM  esomeprazole (NEXIUM) 40 MG capsule Take 40 mg by mouth daily in the afternoon.    [provider]  furosemide  (LASIX ) 20 MG tablet Take 1-2 tablets (20-40 mg total) by mouth daily as needed. 01/14/23   Karamalegos, Kayleen Party, DO  ibuprofen (ADVIL) 100 MG chewable tablet Chew 200 mg by mouth every 8 (eight) hours as needed. Patient states MD told her she can took three to four advil a day for pain up until today.    [provider]  isosorbide  mononitrate (IMDUR ) 30 MG 24 hr tablet TAKE 1 TABLET BY MOUTH IN THE MORNING AND AT BEDTIME 04/11/23   Gollan, Timothy J, MD  lidocaine  (LIDODERM ) 5 % Place 1 patch onto the skin as needed. Remove & Discard patch within 12 hours or as directed by MD    [provider]  Multiple Vitamins-Minerals (MULTIVITAMIN WITH MINERALS) tablet Take 1 tablet by mouth daily.    [provider]  nitroGLYCERIN  (NITROSTAT ) 0.4  MG SL tablet Place 1 tablet (0.4 mg total) under the tongue every 5 (five) minutes x 3 doses as needed for chest pain. 10/12/20   Gollan, Timothy J, MD  oxyCODONE -acetaminophen  (PERCOCET) 5-325 MG tablet Take 1 tablet by mouth every 4 (four) hours as needed for severe pain. 04/09/23   Velma Ghazi, DPM  traMADol  (ULTRAM ) 50 MG tablet Take 1 tablet (50 mg total) by mouth every 4 (four) hours as needed. 01/14/23   Karamalegos, Kayleen Party, DO  Vibegron  (GEMTESA ) 75 MG TABS Take 75 mg by mouth daily. 08/29/22   Matilde Son A, PA-C     Allergies  Morphine  and codeine, Prednisone, Amlodipine , and Latex   Family History   Family History  Problem Relation Age of Onset   Colon cancer Mother    Cerebral aneurysm Father    Heart attack Father    Heart attack Brother 68   Healthy Son      Physical Exam  Triage Vital Signs: ED Triage Vitals [05/06/24 0351]  Encounter Vitals Group     BP      Systolic BP Percentile      Diastolic BP Percentile      Pulse      Resp      Temp      Temp src      SpO2 98 %     Weight      Height      Head Circumference      Peak Flow      Pain Score      Pain Loc      Pain Education      Exclude from Growth Chart     Updated Vital Signs: BP 100/60   Pulse 83   Temp 98 F (36.7 C) (Oral)   Resp (!) 21   Ht 5' 10 (1.778 m)   Wt 96.3 kg   SpO2 98%   BMI 30.46 kg/m    General: Awake, moderate distress.  CV:  RRR.  Good peripheral perfusion.  Resp:  Normal effort.  Diminished, otherwise CTAB. Abd:  Mild generalized abdominal tenderness to palpation without rebound or guarding.  No CVAT.  No distention.  Other:  No truncal vesicles.   ED Results / Procedures /  Treatments  Labs (all labs ordered are listed, but only abnormal results are displayed) Labs Reviewed  COMPREHENSIVE METABOLIC PANEL WITH GFR - Abnormal; Notable for the following components:      Result Value   Potassium 3.3 (*)    Glucose, Bld 114 (*)    BUN 35 (*)     Creatinine, Ser 1.28 (*)    Calcium  10.5 (*)    GFR, Estimated 43 (*)    All other components within normal limits  URINALYSIS, W/ REFLEX TO CULTURE (INFECTION SUSPECTED) - Abnormal; Notable for the following components:   Color, Urine YELLOW (*)    APPearance TURBID (*)    Hgb urine dipstick LARGE (*)    Protein, ur 100 (*)    Leukocytes,Ua MODERATE (*)    All other components within normal limits  CULTURE, BLOOD (ROUTINE X 2)  CULTURE, BLOOD (ROUTINE X 2)  URINE CULTURE  LACTIC ACID, PLASMA  CBC WITH DIFFERENTIAL/PLATELET  TROPONIN I (HIGH SENSITIVITY)  TROPONIN I (HIGH SENSITIVITY)     EKG  ED ECG REPORT I, Sherissa Tenenbaum J, the attending physician, personally viewed and interpreted this ECG.   Date: 05/06/2024  EKG Time: 0401  Rate: 89  Rhythm: normal sinus rhythm  Axis: Normal  Intervals:none  ST&T Change: Nonspecific    RADIOLOGY I have independently visualized and interpreted patient's imaging studies as well as noted the radiology interpretation:  Chest x-ray: Focal opacity  CT renal colic: Cystitis, constipation and sterocoral proctitis, left lower lobe opacity atelectasis versus infectious  Official radiology report(s): CT Renal Stone Study Result Date: 05/06/2024 CLINICAL DATA:  Abdominal/flank pain. Stone suspected. Recently diagnosed with UTI. Increased abdominal pain and burning with urination. EXAM: CT ABDOMEN AND PELVIS WITHOUT CONTRAST TECHNIQUE: Multidetector CT imaging of the abdomen and pelvis was performed following the standard protocol without IV contrast. RADIATION DOSE REDUCTION: This exam was performed according to the departmental dose-optimization program which includes automated exposure control, adjustment of the mA and/or kV according to patient size and/or use of iterative reconstruction technique. COMPARISON:  Last PA and lateral chest series 08/13/2022. Comparison is made with the report of an abdomen and pelvis CT with contrast from  02/25/2017, with images unavailable in PACS, and with images and report of CTs of abdomen and pelvis without contrast 08/24/2012 and 01/09/2012. FINDINGS: Lower chest: There is interval increased laxity in the dorsal left chest wall and increased elevation of the left hemidiaphragm, protruding against the left lower lobe. There is increased opacity in the left lower lobe which could be due to atelectasis, pneumonia aspiration. There are linear scar-like opacities in the right lung base which is otherwise clear. The coronary arteries are heavily calcified. The heart is slightly enlarged. No pericardial effusion. Small hiatal hernia. Hepatobiliary: Liver is unremarkable without contrast. There are least 2 faintly calcified stones in the proximal gallbladder, largest is 1.3 cm. No wall thickening or bile duct dilatation. Pancreas: No abnormality is seen without contrast. No ductal dilatation. Spleen: No abnormality.  No splenomegaly. 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 There is no adrenal mass. There is a Bosniak 1 cyst of the inferior pole right kidney measuring 3.7 and 10 Hounsfield units, was previously 2.6 cm. No follow-up imaging is recommended. There is mild right perinephric fat stranding which has not changed since the 2013 studies. There are punctate up to 3 mm nonobstructive caliceal stones in the right renal collecting system. There is no ureteral stone or hydroureteronephrosis. The bladder is diffusely thickened with  moderate perivesical stranding consistent with cystitis. Stomach/Bowel: There is a left mid abdominal small bowel anastomosis which was not seen previously. This segment is dilated but there is no wall thickening or inflammatory change and this is probably simple surgical widening of the segment. There is a stable colocolic anastomosis in the sigmoid. Moderate fecal stasis. Appendix is not seen in this patient. There is no wall thickening until the rectum which  is mildly thickened and moderately distended with dense stool. 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 Vascular/Lymphatic: Patchy aortoiliac atherosclerosis. No AAA. There is a right external iliac vein stent in place which was not seen previously. No other significant vascular findings.  No lymphadenopathy is seen. Reproductive: Status post hysterectomy. No adnexal masses. Other: Multifocal fat necrosis subcutaneous abdominal wall. No incarcerated hernia. Trace ascites in the posterior pelvis. No free air, free hemorrhage or abscess. Musculoskeletal: Osteopenia and degenerative change of the spine. Slight dextroscoliosis. There is new demonstration of a 9 mm round sclerotic lesion in the anterior superior L3 vertebral body. This was not reported in 2018. Benign and malignant etiologies are both possible. Consider follow-up bone scan. IMPRESSION: 1. Diffuse bladder thickening with moderate perivesical stranding consistent with cystitis. 2. Nonobstructive right nephrolithiasis. No ureteral stone or hydroureteronephrosis. 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 3. Constipation and stercoral proctitis. 4. New left mid abdominal small bowel anastomosis with dilatation of the segment but no wall thickening or inflammatory change, probably simple surgical widening of the segment. 5. Increased laxity in the dorsal left chest wall and increased elevation of the left hemidiaphragm, with increased opacity in the left lower lobe which could be due to atelectasis, pneumonia or aspiration. 6. Aortic and coronary artery atherosclerosis. 7. Cholelithiasis. 8. Trace ascites in the posterior pelvis. 9. 9 mm round sclerotic lesion in the anterior superior L3 vertebral body. This was not reported in 2018. Benign and malignant etiologies are both possible. Consider follow-up bone scan. 10. Small hiatal hernia. 11. Multifocal fat necrosis subcutaneous abdominal wall. Aortic Atherosclerosis (ICD10-I70.0).  Electronically Signed   By: Denman Fischer M.D.   On: 05/06/2024 05:50   DG Chest Port 1 View Result Date: 05/06/2024 CLINICAL DATA:  Chest pain EXAM: PORTABLE CHEST 1 VIEW COMPARISON:  08/13/2022 FINDINGS: New focal opacity identified medial right apex. There is new retrocardiac collapse/consolidation at the left base with small left pleural effusion. Cardiopericardial silhouette is at upper limits of normal for size. No acute bony abnormality. Telemetry leads overlie the chest. IMPRESSION: 1. New focal opacity medial right apex. CT chest recommended to further evaluate. 2. New retrocardiac collapse/consolidation at the left base with small left pleural effusion. Electronically Signed   By: Donnal Fusi M.D.   On: 05/06/2024 05:27     PROCEDURES:  Critical Care performed: No  .1-3 Lead EKG Interpretation  Performed by: Norlene Beavers, MD Authorized by: Norlene Beavers, MD     Ectopy: none     Conduction: normal   Comments:     Patient placed on cardiac monitor to evaluate for arrhythmias    MEDICATIONS ORDERED IN ED: Medications  potassium chloride  10 mEq in 100 mL IVPB (10 mEq Intravenous New Bag/Given 05/06/24 0623)  sodium chloride  0.9 % bolus 1,000 mL (1,000 mLs Intravenous New Bag/Given 05/06/24 0407)  fentaNYL  (SUBLIMAZE ) injection 50 mcg (50 mcg Intravenous Given 05/06/24 0422)  ondansetron  (ZOFRAN ) injection 4 mg (4 mg Intravenous Given 05/06/24 0409)     IMPRESSION / MDM / ASSESSMENT AND PLAN / ED COURSE  I reviewed the triage vital signs and the nursing notes.                             78 year old female presenting with abdominal/flank pain, currently being treated for UTI with IM ertapenem.  Patient's presentation is most consistent with acute complicated illness / injury requiring diagnostic workup.  The patient is on the cardiac monitor to evaluate for evidence of arrhythmia and/or significant heart rate changes.  Will obtain sepsis workup, CT renal colic study.  Will  reassess.  Clinical Course as of 05/06/24 0635  Wed May 06, 2024  0634 Appreciate pharmacy recommendation for IV antibiotic as SNF did not send results of patient's urine culture.  Have consulted hospitalist services for evaluation and admission. [JS]    Clinical Course User Index [JS] Norlene Beavers, MD     FINAL CLINICAL IMPRESSION(S) / ED DIAGNOSES   Final diagnoses:  Generalized abdominal pain  Flank pain  Lower urinary tract infectious disease  Hypokalemia     Rx / DC Orders   ED Discharge Orders     None        Note:  This document was prepared using Dragon voice recognition software and may include unintentional dictation errors.   Eryc Bodey J, MD 05/06/24 [phone]

## 2024-05-06 NOTE — ED Triage Notes (Signed)
 Pt arrives via EMS from [facility] - pt was recently diagnosed with UTI and taking oral abx with additional IM abx. Pt now complaining of increased abdominal pain and back pain and burning with urination. A/ox4, pt is bed bound and incontinent.

## 2024-05-06 NOTE — H&P (Addendum)
 History and Physical    Carrie Larsen [facility] CZY:[id number] DOB: 07-19-1946 DOA: 05/06/2024  PCP: Velna Ghee, MD (Confirm with patient/family/NH records and if not entered, this has to be entered at [facility] point of entry) Patient coming from: SNF  I have personally briefly reviewed patient's old medical records in [facility]  Chief Complaint: Burning sensation, lower abdominal pain, feeling nausea  HPI: Carrie Larsen is a 78 y.o. female with medical history significant of CAD status post stenting, recurrent UTI, history of DVT on Eliquis , chronic HFpEF, sent from nursing home for evaluation of worsening of UTI symptoms.  Patient had a UTI for sinus 3 weeks ago, when she developed dysuria and was treated with Macrobid .  Last Thursday/Friday, patient started to have burning sensations and suprapubic abdominal pain again, and she was started on Macrobid  again and 3 days later, urine culture came back and patient was switched to intramuscular meropenem.  So far she received 2 injections but she continued to feel severe burning sensations and cramping-like bladder pain.  She also feels nausea but no vomiting.  Denies any fever chills no back pains.  She feels so weak and malaise last 2 days.  ED Course: Afebrile, nontachycardic blood pressure 104/61 O2 saturation 97% on room air.  UA showed 3+ WBC and 3+ RBCs.  Patient was started on meropenem.  Blood work showed WBC 9.7 K3.3 BUN 35 creatinine 1.2 glucose 114.  Review of Systems: As per HPI otherwise 14 point review of systems negative.    Past Medical History:  Diagnosis Date   Anxiety    Arthritis    osteoarthritis   CAD (coronary artery disease)    a. 2012 ETT: no ischemia;  b. 06/2017 NSTEMI/PCI: LM nl, LAD nl, D1 70-80p, LCX nl, OM1 90 (3.0 x 23 Xience Alpine), RCA dominant, 100p/m, fills via L->R collats, EF 55-65%.   Cancer [facility])    a. s/p partial left nephrectomy.   Chicken pox    CME (cystoid macular edema), left 11/20/2018    Colon cancer (HCC) 1992   T3, N1, M0. colon    Colon polyp 2011   COVID-19 07/2019   Diastolic dysfunction    a. 08/2015 Echo: EF 60-65%, no rwma, Gr1 DD, midlly dil LA, PASP . No significant valvular dzs; b. 06/2017 Echo: EF 60-65%, Gr1DD, mildly dil LA.   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   Obesity    Prediabetes    Pseudophakia of both eyes    Retinal cyst     Past Surgical History:  Procedure Laterality Date   ABDOMINAL HYSTERECTOMY     APPENDECTOMY  06/07/2015   CARDIAC CATHETERIZATION     CATARACT EXTRACTION W/PHACO Left 04/24/2016   Procedure: CATARACT EXTRACTION PHACO AND INTRAOCULAR LENS PLACEMENT (IOC);  Surgeon: Clair Crews, MD;  Location: [facility] ORS;  Service: Ophthalmology;  Laterality: Left;  US  45.8 AP% 16.4 CDE 7.53 FLUID PACK LOT # [id number]   CATARACT EXTRACTION W/PHACO Right 06/05/2016   Procedure: CATARACT EXTRACTION PHACO AND INTRAOCULAR LENS PLACEMENT (IOC);  Surgeon: Clair Crews, MD;  Location: [facility] ORS;  Service: Ophthalmology;  Laterality: Right;  US  25.9 AP% 21.9 CDE 5.68 Fluid pack lot # [id number]   COLON RESECTION  03/04/1991   COLON SURGERY     COLONOSCOPY  03-14-10   Dr Marquita Situ, tubular adenoma at 25 cm.   COLONOSCOPY WITH PROPOFOL  N/A 06/07/2015   Procedure: COLONOSCOPY WITH PROPOFOL ;  Surgeon: [name] Skeeter, MD;  Location: [facility] ENDOSCOPY;  Service: Endoscopy;  Laterality: N/A;   COLONOSCOPY WITH PROPOFOL  N/A 08/10/2020   Procedure: COLONOSCOPY WITH PROPOFOL ;  Surgeon: [name] Skeeter, MD;  Location: [facility] ENDOSCOPY;  Service: Endoscopy;  Laterality: N/A;   CORONARY STENT INTERVENTION N/A 06/28/2017    Procedure: CORONARY STENT INTERVENTION;  Surgeon: Antonette Batters, MD;  Location: [facility] INVASIVE CV LAB;  Service: Cardiovascular;  Laterality: N/A;   DIALYSIS/PERMA CATHETER REMOVAL N/A 12/23/2023   Procedure: DIALYSIS/PERMA CATHETER REMOVAL;  Surgeon: Celso College, MD;  Location: [facility] INVASIVE CV LAB;  Service: Cardiovascular;  Laterality: N/A;   EYE SURGERY     lens eye surgery   HERNIA REPAIR  1993   umbilical   LEFT HEART CATH AND CORONARY ANGIOGRAPHY N/A 06/28/2017   Procedure: LEFT HEART CATH AND CORONARY ANGIOGRAPHY;  Surgeon: Devorah Fonder, MD;  Location: [facility] INVASIVE CV LAB;  Service: Cardiovascular;  Laterality: N/A;   LUMBAR LAMINECTOMY/DECOMPRESSION MICRODISCECTOMY Bilateral 03/10/2018   Procedure: Laminectomy and Foraminotomy - Lumbar three-Lumbar four - Lumbar four-Lumbar five - bilateral;  Surgeon: Agustina Aldrich, MD;  Location: [facility] OR;  Service: Neurosurgery;  Laterality: Bilateral;   NEPHRECTOMY  1992   left- cancer   PERIPHERAL VASCULAR THROMBECTOMY Right 10/01/2022   Procedure: PERIPHERAL VASCULAR THROMBECTOMY;  Surgeon: Celso College, MD;  Location: [facility] INVASIVE CV LAB;  Service: Cardiovascular;  Laterality: Right;   RIB RESECTION     removal 4 ribs   TONSILLECTOMY       reports that she has never smoked. She has never been exposed to tobacco smoke. She has never used smokeless tobacco. She reports that she does not drink alcohol and does not use drugs.  Allergies  Allergen Reactions   Morphine  And Codeine Nausea Only   Prednisone Nausea And Vomiting   Amlodipine  Palpitations   Latex Rash    Family History  Problem Relation Age of Onset   Colon cancer Mother    Cerebral aneurysm Father    Heart attack Father    Heart attack Brother 77   Healthy Son      Prior to Admission medications   Medication Sig Start Date End Date Taking? Authorizing Provider  allopurinol  (ZYLOPRIM ) 100 MG tablet Take 2 tablets (200 mg total) by mouth daily. 01/14/23   Karamalegos,  Kayleen Party, DO  apixaban  (ELIQUIS ) 2.5 MG TABS tablet Take 1 tablet (2.5 mg total) by mouth 2 (two) times daily. 04/11/23   Brown, Fallon E, NP  atorvastatin  (LIPITOR ) 80 MG tablet Take 1 tablet by mouth once daily 11/08/22   Gollan, Timothy J, MD  carvedilol  (COREG ) 25 MG tablet Take 1 tablet by mouth twice daily 03/11/23   Furth, Cadence H, PA-C  colchicine  0.6 MG tablet Take 1 tablet (0.6 mg total) by mouth daily. Take 1 tablet TID until GI upset or flare subsides, then for maintenance, take 1 tablet daily. Patient taking differently: Take 0.6 mg by mouth daily. Take 1 tablet TID until GI upset or flare subsides, then for maintenance, take 1 tablet daily. Every other day. 08/22/22   Hyatt, Max T, DPM  esomeprazole (NEXIUM) 40 MG capsule Take 40 mg by mouth daily in  the afternoon.    [provider]  ibuprofen (ADVIL) 100 MG chewable tablet Chew 200 mg by mouth every 8 (eight) hours as needed. Patient states MD told her she can took three to four advil a day for pain up until today.    [provider]  isosorbide  mononitrate (IMDUR ) 30 MG 24 hr tablet TAKE 1 TABLET BY MOUTH IN THE MORNING AND AT BEDTIME 04/11/23   Gollan, Timothy J, MD  lidocaine  (LIDODERM ) 5 % Place 1 patch onto the skin as needed. Remove & Discard patch within 12 hours or as directed by MD    [provider]  Multiple Vitamins-Minerals (MULTIVITAMIN WITH MINERALS) tablet Take 1 tablet by mouth daily.    [provider]  nitroGLYCERIN  (NITROSTAT ) 0.4 MG SL tablet Place 1 tablet (0.4 mg total) under the tongue every 5 (five) minutes x 3 doses as needed for chest pain. 10/12/20   Gollan, Timothy J, MD  oxyCODONE -acetaminophen  (PERCOCET) 5-325 MG tablet Take 1 tablet by mouth every 4 (four) hours as needed for severe pain. 04/09/23   Velma Ghazi, DPM  traMADol  (ULTRAM ) 50 MG tablet Take 1 tablet (50 mg total) by mouth every 4 (four) hours as needed. 01/14/23   Karamalegos, Kayleen Party, DO  Vibegron   (GEMTESA ) 75 MG TABS Take 75 mg by mouth daily. 08/29/22   Oda Bence, PA-C    Physical Exam: Vitals:   05/06/24 0430 05/06/24 0600 05/06/24 0700 05/06/24 0800  BP: 105/60 100/60 127/72 104/61  Pulse: 88 83 82 80  Resp: (!) 33 (!) 21 (!) 24 (!) 21  Temp:      TempSrc:      SpO2: 100% 98% 97% 96%  Weight:      Height:        Constitutional: NAD, calm, comfortable Vitals:   05/06/24 0430 05/06/24 0600 05/06/24 0700 05/06/24 0800  BP: 105/60 100/60 127/72 104/61  Pulse: 88 83 82 80  Resp: (!) 33 (!) 21 (!) 24 (!) 21  Temp:      TempSrc:      SpO2: 100% 98% 97% 96%  Weight:      Height:       Eyes: PERRL, lids and conjunctivae normal ENMT: Mucous membranes are moist. Posterior pharynx clear of any exudate or lesions.Normal dentition.  Neck: normal, supple, no masses, no thyromegaly Respiratory: clear to auscultation bilaterally, no wheezing, no crackles. Normal respiratory effort. No accessory muscle use.  Cardiovascular: Regular rate and rhythm, no murmurs / rubs / gallops. No extremity edema. 2+ pedal pulses. No carotid bruits.  Abdomen: Tenderness on deep palpation on suprapubic area, no rebound no guarding, no masses palpated. No hepatosplenomegaly. Bowel sounds positive.  Musculoskeletal: no clubbing / cyanosis. No joint deformity upper and lower extremities. Good ROM, no contractures. Normal muscle tone.  Skin: no rashes, lesions, ulcers. No induration Neurologic: CN 2-12 grossly intact. Sensation intact, DTR normal. Strength 5/5 in all 4.  Psychiatric: Normal judgment and insight. Alert and oriented x 3. Normal mood.     Labs on Admission: I have personally reviewed following labs and imaging studies  CBC: Recent Labs  Lab 05/06/24 0423  WBC 9.7  NEUTROABS 5.7  HGB 12.7  HCT 39.3  MCV 90.8  PLT 358   Basic Metabolic Panel: Recent Labs  Lab 05/06/24 0423  NA 138  K 3.3*  CL 103  CO2 24  GLUCOSE 114*  BUN 35*  CREATININE 1.28*  CALCIUM  10.5*    GFR: Estimated  Creatinine Clearance: 45.5 mL/min (A) (by C-G formula based on SCr of 1.28 mg/dL (H)). Liver Function Tests: Recent Labs  Lab 05/06/24 0423  AST 21  ALT 14  ALKPHOS 89  BILITOT 0.5  PROT 6.7  ALBUMIN 3.5   No results for input(s): LIPASE, AMYLASE in the last 168 hours. No results for input(s): AMMONIA in the last 168 hours. Coagulation Profile: No results for input(s): INR, PROTIME in the last 168 hours. Cardiac Enzymes: No results for input(s): CKTOTAL, CKMB, CKMBINDEX, TROPONINI in the last 168 hours. BNP (last 3 results) No results for input(s): PROBNP in the last 8760 hours. HbA1C: No results for input(s): HGBA1C in the last 72 hours. CBG: No results for input(s): GLUCAP in the last 168 hours. Lipid Profile: No results for input(s): CHOL, HDL, LDLCALC, TRIG, CHOLHDL, LDLDIRECT in the last 72 hours. Thyroid  Function Tests: No results for input(s): TSH, T4TOTAL, FREET4, T3FREE, THYROIDAB in the last 72 hours. Anemia Panel: No results for input(s): VITAMINB12, FOLATE, FERRITIN, TIBC, IRON, RETICCTPCT in the last 72 hours. Urine analysis:    Component Value Date/Time   COLORURINE YELLOW (A) 05/06/2024 0423   APPEARANCEUR TURBID (A) 05/06/2024 0423   APPEARANCEUR Clear 08/29/2022 1425   LABSPEC 1.013 05/06/2024 0423   LABSPEC 1.029 11/15/2013 0114   PHURINE 5.0 05/06/2024 0423   GLUCOSEU NEGATIVE 05/06/2024 0423   GLUCOSEU Negative 11/15/2013 0114   HGBUR LARGE (A) 05/06/2024 0423   BILIRUBINUR NEGATIVE 05/06/2024 0423   BILIRUBINUR Negative 08/29/2022 1425   BILIRUBINUR Negative 11/15/2013 0114   KETONESUR NEGATIVE 05/06/2024 0423   PROTEINUR 100 (A) 05/06/2024 0423   NITRITE NEGATIVE 05/06/2024 0423   LEUKOCYTESUR MODERATE (A) 05/06/2024 0423   LEUKOCYTESUR 2+ 11/15/2013 0114    Radiological Exams on Admission: CT Renal Stone Study Result Date: 05/06/2024 CLINICAL DATA:   Abdominal/flank pain. Stone suspected. Recently diagnosed with UTI. Increased abdominal pain and burning with urination. EXAM: CT ABDOMEN AND PELVIS WITHOUT CONTRAST TECHNIQUE: Multidetector CT imaging of the abdomen and pelvis was performed following the standard protocol without IV contrast. RADIATION DOSE REDUCTION: This exam was performed according to the departmental dose-optimization program which includes automated exposure control, adjustment of the mA and/or kV according to patient size and/or use of iterative reconstruction technique. COMPARISON:  Last PA and lateral chest series 08/13/2022. Comparison is made with the report of an abdomen and pelvis CT with contrast from 02/25/2017, with images unavailable in PACS, and with images and report of CTs of abdomen and pelvis without contrast 08/24/2012 and 01/09/2012. FINDINGS: Lower chest: There is interval increased laxity in the dorsal left chest wall and increased elevation of the left hemidiaphragm, protruding against the left lower lobe. There is increased opacity in the left lower lobe which could be due to atelectasis, pneumonia aspiration. There are linear scar-like opacities in the right lung base which is otherwise clear. The coronary arteries are heavily calcified. The heart is slightly enlarged. No pericardial effusion. Small hiatal hernia. Hepatobiliary: Liver is unremarkable without contrast. There are least 2 faintly calcified stones in the proximal gallbladder, largest is 1.3 cm. No wall thickening or bile duct dilatation. Pancreas: No abnormality is seen without contrast. No ductal dilatation. Spleen: No abnormality.  No splenomegaly. 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 There is no adrenal mass. There is a Bosniak 1 cyst of the inferior pole right kidney measuring 3.7 and 10 Hounsfield units, was previously 2.6 cm. No follow-up imaging is recommended. There is mild right perinephric fat stranding  which has not  changed since the 2013 studies. There are punctate up to 3 mm nonobstructive caliceal stones in the right renal collecting system. There is no ureteral stone or hydroureteronephrosis. The bladder is diffusely thickened with moderate perivesical stranding consistent with cystitis. Stomach/Bowel: There is a left mid abdominal small bowel anastomosis which was not seen previously. This segment is dilated but there is no wall thickening or inflammatory change and this is probably simple surgical widening of the segment. There is a stable colocolic anastomosis in the sigmoid. Moderate fecal stasis. Appendix is not seen in this patient. There is no wall thickening until the rectum which is mildly thickened and moderately distended with dense stool. 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 Vascular/Lymphatic: Patchy aortoiliac atherosclerosis. No AAA. There is a right external iliac vein stent in place which was not seen previously. No other significant vascular findings.  No lymphadenopathy is seen. Reproductive: Status post hysterectomy. No adnexal masses. Other: Multifocal fat necrosis subcutaneous abdominal wall. No incarcerated hernia. Trace ascites in the posterior pelvis. No free air, free hemorrhage or abscess. Musculoskeletal: Osteopenia and degenerative change of the spine. Slight dextroscoliosis. There is new demonstration of a 9 mm round sclerotic lesion in the anterior superior L3 vertebral body. This was not reported in 2018. Benign and malignant etiologies are both possible. Consider follow-up bone scan. IMPRESSION: 1. Diffuse bladder thickening with moderate perivesical stranding consistent with cystitis. 2. Nonobstructive right nephrolithiasis. No ureteral stone or hydroureteronephrosis. 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 3. Constipation and stercoral proctitis. 4. New left mid abdominal small bowel anastomosis with dilatation of the segment but no wall thickening or inflammatory  change, probably simple surgical widening of the segment. 5. Increased laxity in the dorsal left chest wall and increased elevation of the left hemidiaphragm, with increased opacity in the left lower lobe which could be due to atelectasis, pneumonia or aspiration. 6. Aortic and coronary artery atherosclerosis. 7. Cholelithiasis. 8. Trace ascites in the posterior pelvis. 9. 9 mm round sclerotic lesion in the anterior superior L3 vertebral body. This was not reported in 2018. Benign and malignant etiologies are both possible. Consider follow-up bone scan. 10. Small hiatal hernia. 11. Multifocal fat necrosis subcutaneous abdominal wall. Aortic Atherosclerosis (ICD10-I70.0). Electronically Signed   By: Denman Fischer M.D.   On: 05/06/2024 05:50   DG Chest Port 1 View Result Date: 05/06/2024 CLINICAL DATA:  Chest pain EXAM: PORTABLE CHEST 1 VIEW COMPARISON:  08/13/2022 FINDINGS: New focal opacity identified medial right apex. There is new retrocardiac collapse/consolidation at the left base with small left pleural effusion. Cardiopericardial silhouette is at upper limits of normal for size. No acute bony abnormality. Telemetry leads overlie the chest. IMPRESSION: 1. New focal opacity medial right apex. CT chest recommended to further evaluate. 2. New retrocardiac collapse/consolidation at the left base with small left pleural effusion. Electronically Signed   By: Donnal Fusi M.D.   On: 05/06/2024 05:27    EKG: Independently reviewed.  Sinus rhythm, no acute ST changes.  Assessment/Plan Principal Problem:   Urinary tract infection Active Problems:   UTI (urinary tract infection)  (please populate well all problems here in Problem List. (For example, if patient is on BP meds at home and you resume or decide to hold them, it is a problem that needs to be her. Same for CAD, COPD, HLD and so on)  Complicated UTI with hematuria, failed outpatient management - Still waiting for SNF record of urine culture  result - Pharmacy consulted  and IV meropenem recommended.  If confirmed ESBL UTI, may need to consider PICC line and IV meropenem therapy 10 to 14 days. - No medical record accompanied patient on arrival.  I have sent a request of urine culture result to nursing home  Acute ambulatory impairment - Secondary to UTI, treat UTI as above - PT evaluation  Hypokalemia - P.o. replacement  Hx of CAD - No chest pain, no ischemic EKG changes - Continue Eliquis , Coreg  and Imdur   History of gout - No acute concern, continue allopurinol   History of DVT - Continue Eliquis    DVT prophylaxis: Eliquis  Code Status: DNR Family Communication: None at bedside Disposition Plan: Expect less than 2 midnight hospital stay Consults called: None Admission status: MedSurg observation   [place] MD [facility] Pager [phone]  05/06/2024, 8:58 AM
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
## 2024-05-07 ENCOUNTER — Observation Stay

## 2024-05-07 DIAGNOSIS — R262 Difficulty in walking, not elsewhere classified: Secondary | ICD-10-CM | POA: Diagnosis present

## 2024-05-07 DIAGNOSIS — N3001 Acute cystitis with hematuria: Secondary | ICD-10-CM | POA: Diagnosis not present

## 2024-05-07 DIAGNOSIS — E876 Hypokalemia: Secondary | ICD-10-CM | POA: Diagnosis present

## 2024-05-07 DIAGNOSIS — R197 Diarrhea, unspecified: Secondary | ICD-10-CM

## 2024-05-07 LAB — GASTROINTESTINAL PANEL BY PCR, STOOL (REPLACES STOOL CULTURE)

## 2024-05-07 LAB — CBC
HCT: 33.9 % — ABNORMAL LOW (ref 36.0–46.0)
Hemoglobin: 10.5 g/dL — ABNORMAL LOW (ref 12.0–15.0)
MCH: 29 pg (ref 26.0–34.0)
MCHC: 31 g/dL (ref 30.0–36.0)
MCV: 93.6 fL (ref 80.0–100.0)
Platelets: 305 10*3/uL (ref 150–400)
RBC: 3.62 MIL/uL — ABNORMAL LOW (ref 3.87–5.11)
RDW: 13.8 % (ref 11.5–15.5)
WBC: 8.9 10*3/uL (ref 4.0–10.5)
nRBC: 0 % (ref 0.0–0.2)

## 2024-05-07 LAB — C DIFFICILE QUICK SCREEN W PCR REFLEX
C Diff antigen: NEGATIVE
C Diff interpretation: NOT DETECTED
C Diff toxin: NEGATIVE

## 2024-05-07 LAB — BASIC METABOLIC PANEL WITH GFR
Anion gap: 7 (ref 5–15)
BUN: 32 mg/dL — ABNORMAL HIGH (ref 8–23)
CO2: 23 mmol/L (ref 22–32)
Calcium: 10 mg/dL (ref 8.9–10.3)
Chloride: 111 mmol/L (ref 98–111)
Creatinine, Ser: 1.46 mg/dL — ABNORMAL HIGH (ref 0.44–1.00)
GFR, Estimated: 37 mL/min — ABNORMAL LOW (ref >60)
Glucose, Bld: 107 mg/dL — ABNORMAL HIGH (ref 70–99)
Potassium: 3.9 mmol/L (ref 3.5–5.1)
Sodium: 141 mmol/L (ref 135–145)

## 2024-05-07 LAB — GLUCOSE, CAPILLARY
Glucose-Capillary: 106 mg/dL — ABNORMAL HIGH (ref 70–99)
Glucose-Capillary: 120 mg/dL — ABNORMAL HIGH (ref 70–99)
Glucose-Capillary: 128 mg/dL — ABNORMAL HIGH (ref 70–99)
Glucose-Capillary: 87 mg/dL (ref 70–99)

## 2024-05-07 LAB — MRSA NEXT GEN BY PCR, NASAL: MRSA by PCR Next Gen: NOT DETECTED

## 2024-05-07 LAB — URINE CULTURE: Culture: NO GROWTH

## 2024-05-07 MED ORDER — SODIUM CHLORIDE 0.9 % IV SOLN
500.0000 mg | INTRAVENOUS | Status: DC
  Administered 2024-05-07 – 2024-05-09 (×3): 500 mg via INTRAVENOUS
  Filled 2024-05-07 (×3): qty 5

## 2024-05-07 NOTE — Care Management Obs Status (Signed)
 MEDICARE OBSERVATION STATUS NOTIFICATION   Patient Details  Name: Carrie Larsen MRN: [id number] Date of Birth: 14-May-1946   Medicare Observation Status Notification Given:  Yes    Anise Kerns 05/07/2024, 10:59 AM

## 2024-05-07 NOTE — Progress Notes (Signed)
 Progress Note   Patient: Carrie Larsen LKG:[id number] DOB: [birth year] DOA: 05/06/2024     0 DOS: the patient was seen and examined on 05/07/2024   Brief hospital course:  HPI on admission: Zena Vitelli is a 78 y.o. female with medical history significant of CAD status post stenting, recurrent UTI, history of DVT on Eliquis , chronic HFpEF, sent from nursing home for evaluation of worsening of UTI symptoms.   Patient had a UTI for sinus 3 weeks ago, when she developed dysuria and was treated with Macrobid .  Last Thursday/Friday, patient started to have burning sensations and suprapubic abdominal pain again, and she was started on Macrobid  again and 3 days later, urine culture came back and patient was switched to intramuscular meropenem.  So far she received 2 injections but she continued to feel severe burning sensations and cramping-like bladder pain.  She also feels nausea but no vomiting.  Denies any fever chills no back pains.  She feels so weak and malaise last 2 days.   ED Course: Afebrile, nontachycardic blood pressure 104/61 O2 saturation 97% on room air.  UA showed 3+ WBC and 3+ RBCs.  Patient was started on meropenem.  Blood work showed WBC 9.7 K3.3 BUN 35 creatinine 1.2 glucose 114.  Patient was admitted and started on IV antibiotics for UTI. Further hospital course and management as outlined below.    Assessment and Plan:  Complicated UTI with hematuria, failed outpatient management --Awaiting urine culture result from SNF - requested by admitting hospitalist --Pharmacy consulted on admission, IV meropenem recommended.  If confirmed ESBL UTI, may need to consider PICC line and IV meropenem therapy 10 to 14 days. --Repeat urine culture here is no growth  Diarrhea - POA. Pt notes ongoing for about the past 4 months, with associated abdominal cramping pain prior to BM's.  --Check C diff & GI panel --Enteric precautions until results --Can use Imodium if above are  negative --Hold stool softeners  Abnormal chest x-ray - findings concerning for pneumonia, but clinically patient has no respiratory symptoms. --CT chest w/o contrast pending for further evalution   Acute ambulatory impairment / generalized weakness Secondary to UTI, mgmt of UTI as above --PT evaluation --SNF recommended, TOC following for placement.   Hypokalemia K was replaced on admission --Monitor BMP, replace K as needed  Hx of CAD - stable, no chest pain, no ischemic EKG changes --Continue Eliquis , Coreg  and Imdur    History of gout - stable, no flare symptoms --Continue allopurinol    History of DVT --Continue Eliquis        Subjective: Pt seen with RN at bedside this AM.  Pt reports 4 months of diarrhea and incontinence.  She reports very poor quality of care and rude staff and the facility where she's been, requesting to talk to case manager about different facility.  She gets tearful speaking about the issues she has faced there.  She denies fever/chills, nausea/vomiting.  Gets lower abdominal / rectal pain before BM's.  Has not been on stool softeners.   Physical Exam: Vitals:   05/06/24 1642 05/06/24 1952 05/07/24 0421 05/07/24 0754  BP: (!) 107/51 (!) 104/51 (!) 98/40 113/60  Pulse: 72 70 75 74  Resp: 16 16 16 16   Temp: 97.6 F (36.4 C) 97.8 F (36.6 C) 98.2 F (36.8 C) 97.8 F (36.6 C)  TempSrc: Oral Oral  Oral  SpO2: 100% 100% 96% 99%  Weight:      Height:       General exam:  awake, alert, no acute distress HEENT: atraumatic, clear conjunctiva, anicteric sclera, moist mucus membranes, hearing grossly normal  Respiratory system: CTAB, no wheezes, rales or rhonchi, normal respiratory effort. Cardiovascular system: normal S1/S2, RRR, no pedal edema.   Gastrointestinal system: soft, NT, ND, no HSM felt, +bowel sounds. Central nervous system: A&O x3. no gross focal neurologic deficits, normal speech Extremities: moves all, no edema, normal tone Skin: dry,  intact, normal temperature, normal color, No rashes, lesions or ulcers Psychiatry: depressed mood, congruent affect, judgement and insight appear normal   Data Reviewed:  Notable labs --  Glucose 107, BUN 32, Cr 11.46, Hbg 10.5  C diff - negative GI panel - negative   Family Communication: None present, will attempt to call as time allows  Disposition: Status is: Observation Remains admitted on IV antibiotics pending outside urine culture results, ongoing evaluation as above.   Planned Discharge Destination: Skilled nursing facility    Time spent: 45 minutes  Author: Montey Apa, DO 05/07/2024 12:47 PM  For on call review [email or web address].

## 2024-05-07 NOTE — Plan of Care (Signed)

## 2024-05-07 NOTE — Progress Notes (Signed)
 PT Cancellation Note  Patient Details Name: Carrie Larsen MRN: [id number] DOB: [birth year]   Cancelled Treatment:    Reason Eval/Treat Not Completed: Pain limiting ability to participate;Medical issues which prohibited therapy Pt again tearful and quick to refuse PT.  She reports lower abdominal pain is more severe today and that she is still having trouble controlling her loose stools.  Pt adamant that she is not able/willing to try any mobility/exercises/PT today.   Darice Edelman, DPT 05/07/2024, 11:06 AM

## 2024-05-08 ENCOUNTER — Inpatient Hospital Stay

## 2024-05-08 DIAGNOSIS — Z7901 Long term (current) use of anticoagulants: Secondary | ICD-10-CM | POA: Diagnosis not present

## 2024-05-08 DIAGNOSIS — R159 Full incontinence of feces: Secondary | ICD-10-CM | POA: Diagnosis present

## 2024-05-08 DIAGNOSIS — J189 Pneumonia, unspecified organism: Secondary | ICD-10-CM | POA: Diagnosis present

## 2024-05-08 DIAGNOSIS — N3001 Acute cystitis with hematuria: Secondary | ICD-10-CM

## 2024-05-08 DIAGNOSIS — K5900 Constipation, unspecified: Secondary | ICD-10-CM | POA: Diagnosis present

## 2024-05-08 DIAGNOSIS — F439 Reaction to severe stress, unspecified: Secondary | ICD-10-CM | POA: Diagnosis not present

## 2024-05-08 DIAGNOSIS — I5032 Chronic diastolic (congestive) heart failure: Secondary | ICD-10-CM | POA: Diagnosis present

## 2024-05-08 DIAGNOSIS — Z1623 Resistance to quinolones and fluoroquinolones: Secondary | ICD-10-CM | POA: Diagnosis present

## 2024-05-08 DIAGNOSIS — R4789 Other speech disturbances: Secondary | ICD-10-CM | POA: Diagnosis not present

## 2024-05-08 DIAGNOSIS — I110 Hypertensive heart disease with heart failure: Secondary | ICD-10-CM | POA: Diagnosis present

## 2024-05-08 DIAGNOSIS — N390 Urinary tract infection, site not specified: Secondary | ICD-10-CM | POA: Diagnosis present

## 2024-05-08 DIAGNOSIS — R569 Unspecified convulsions: Secondary | ICD-10-CM

## 2024-05-08 DIAGNOSIS — M48061 Spinal stenosis, lumbar region without neurogenic claudication: Secondary | ICD-10-CM | POA: Diagnosis present

## 2024-05-08 DIAGNOSIS — E785 Hyperlipidemia, unspecified: Secondary | ICD-10-CM | POA: Diagnosis present

## 2024-05-08 DIAGNOSIS — R1084 Generalized abdominal pain: Secondary | ICD-10-CM

## 2024-05-08 DIAGNOSIS — F449 Dissociative and conversion disorder, unspecified: Secondary | ICD-10-CM

## 2024-05-08 DIAGNOSIS — R262 Difficulty in walking, not elsewhere classified: Secondary | ICD-10-CM | POA: Diagnosis not present

## 2024-05-08 DIAGNOSIS — K5641 Fecal impaction: Secondary | ICD-10-CM | POA: Diagnosis present

## 2024-05-08 DIAGNOSIS — M109 Gout, unspecified: Secondary | ICD-10-CM | POA: Diagnosis present

## 2024-05-08 DIAGNOSIS — K5289 Other specified noninfective gastroenteritis and colitis: Secondary | ICD-10-CM | POA: Diagnosis present

## 2024-05-08 DIAGNOSIS — K592 Neurogenic bowel, not elsewhere classified: Secondary | ICD-10-CM | POA: Diagnosis not present

## 2024-05-08 DIAGNOSIS — R4182 Altered mental status, unspecified: Secondary | ICD-10-CM

## 2024-05-08 DIAGNOSIS — Z8616 Personal history of COVID-19: Secondary | ICD-10-CM | POA: Diagnosis not present

## 2024-05-08 DIAGNOSIS — H353131 Nonexudative age-related macular degeneration, bilateral, early dry stage: Secondary | ICD-10-CM | POA: Diagnosis present

## 2024-05-08 DIAGNOSIS — Z85528 Personal history of other malignant neoplasm of kidney: Secondary | ICD-10-CM | POA: Diagnosis not present

## 2024-05-08 DIAGNOSIS — B964 Proteus (mirabilis) (morganii) as the cause of diseases classified elsewhere: Secondary | ICD-10-CM | POA: Diagnosis present

## 2024-05-08 DIAGNOSIS — I2510 Atherosclerotic heart disease of native coronary artery without angina pectoris: Secondary | ICD-10-CM | POA: Diagnosis present

## 2024-05-08 DIAGNOSIS — E876 Hypokalemia: Secondary | ICD-10-CM | POA: Diagnosis present

## 2024-05-08 DIAGNOSIS — Z8249 Family history of ischemic heart disease and other diseases of the circulatory system: Secondary | ICD-10-CM | POA: Diagnosis not present

## 2024-05-08 DIAGNOSIS — Z66 Do not resuscitate: Secondary | ICD-10-CM | POA: Diagnosis present

## 2024-05-08 DIAGNOSIS — Z905 Acquired absence of kidney: Secondary | ICD-10-CM

## 2024-05-08 DIAGNOSIS — K219 Gastro-esophageal reflux disease without esophagitis: Secondary | ICD-10-CM | POA: Diagnosis present

## 2024-05-08 DIAGNOSIS — Z7401 Bed confinement status: Secondary | ICD-10-CM | POA: Diagnosis not present

## 2024-05-08 DIAGNOSIS — E669 Obesity, unspecified: Secondary | ICD-10-CM | POA: Diagnosis present

## 2024-05-08 DIAGNOSIS — J440 Chronic obstructive pulmonary disease with acute lower respiratory infection: Secondary | ICD-10-CM | POA: Diagnosis present

## 2024-05-08 DIAGNOSIS — Z961 Presence of intraocular lens: Secondary | ICD-10-CM | POA: Diagnosis present

## 2024-05-08 LAB — LACTIC ACID, PLASMA: Lactic Acid, Venous: 1.7 mmol/L (ref 0.5–1.9)

## 2024-05-08 LAB — GLUCOSE, CAPILLARY
Glucose-Capillary: 100 mg/dL — ABNORMAL HIGH (ref 70–99)
Glucose-Capillary: 119 mg/dL — ABNORMAL HIGH (ref 70–99)
Glucose-Capillary: 118 mg/dL — ABNORMAL HIGH (ref 70–99)
Glucose-Capillary: 107 mg/dL — ABNORMAL HIGH (ref 70–99)

## 2024-05-08 LAB — LACTATE DEHYDROGENASE: LDH: 109 U/L (ref 98–192)

## 2024-05-08 MED ORDER — LORAZEPAM 2 MG/ML IJ SOLN: 2.0000 mg | Freq: Once | INTRAMUSCULAR | Status: DC

## 2024-05-08 MED ORDER — IOHEXOL 9 MG/ML PO SOLN: 500.0000 mL | ORAL | Status: AC

## 2024-05-08 MED ORDER — IOHEXOL 350 MG/ML SOLN
75.0000 mL | Freq: Once | INTRAVENOUS | Status: AC | PRN
Start: 2024-05-08 — End: 2024-05-08
  Administered 2024-05-08: 75 mL via INTRAVENOUS

## 2024-05-08 MED ORDER — MAGNESIUM HYDROXIDE 400 MG/5ML PO SUSP
15.0000 mL | Freq: Every day | ORAL | Status: DC
  Administered 2024-05-08 – 2024-05-13 (×5): 15 mL via ORAL
  Filled 2024-05-08 (×8): qty 30

## 2024-05-08 MED ORDER — POLYETHYLENE GLYCOL 3350 17 G PO PACK
17.0000 g | PACK | Freq: Two times a day (BID) | ORAL | Status: DC
  Administered 2024-05-08 – 2024-05-13 (×9): 17 g via ORAL
  Filled 2024-05-08 (×13): qty 1

## 2024-05-08 MED ORDER — LORAZEPAM 2 MG/ML IJ SOLN
INTRAMUSCULAR | Status: AC
  Filled 2024-05-08: qty 1

## 2024-05-08 MED ORDER — BISACODYL 5 MG PO TBEC
5.0000 mg | DELAYED_RELEASE_TABLET | Freq: Every day | ORAL | Status: DC
  Administered 2024-05-08 – 2024-05-13 (×5): 5 mg via ORAL
  Filled 2024-05-08 (×7): qty 1

## 2024-05-08 MED ORDER — HYDROCODONE-ACETAMINOPHEN 5-325 MG PO TABS
1.0000 | ORAL_TABLET | Freq: Four times a day (QID) | ORAL | Status: DC | PRN
  Administered 2024-05-09 – 2024-05-10 (×2): 1 via ORAL
  Filled 2024-05-08 (×4): qty 1

## 2024-05-08 MED ORDER — LORAZEPAM 2 MG/ML IJ SOLN
1.0000 mg | Freq: Once | INTRAMUSCULAR | Status: AC
  Administered 2024-05-08: 1 mg via INTRAVENOUS

## 2024-05-08 MED ORDER — SMOG ENEMA
960.0000 mL | Freq: Once | RECTAL | Status: AC
  Administered 2024-05-08: 960 mL via RECTAL
  Filled 2024-05-08 (×2): qty 960

## 2024-05-08 MED ORDER — ALPRAZOLAM 0.25 MG PO TABS
0.2500 mg | ORAL_TABLET | Freq: Two times a day (BID) | ORAL | Status: DC | PRN
  Administered 2024-05-08 – 2024-05-13 (×4): 0.25 mg via ORAL
  Filled 2024-05-08 (×4): qty 1

## 2024-05-08 MED ORDER — SODIUM CHLORIDE 0.9 % IV SOLN: INTRAVENOUS | Status: DC

## 2024-05-08 NOTE — Procedures (Signed)
 History: 78 year old female with abrupt mental status change, evaluate for seizures  EEG Duration: 22-minute  Sedation: Ativan 1 mg given prior to EEG  Patient State: Awake and drowsy  Technique: This EEG was acquired with electrodes placed according to the International 10-20 electrode system (including Fp1, Fp2, F3, F4, C3, C4, P3, P4, O1, O2, T3, T4, T5, T6, A1, A2, Fz, Cz, Pz). The following electrodes were missing or displaced: none.   Background: The background consists of intermixed alpha and beta activities. There is a well defined posterior dominant rhythm of 9-10 hz that attenuates with eye opening.  With drowsiness, there is anterior shifting of the posterior dominant rhythm, but well-formed sleep is not recorded.  There was no epileptiform discharge seen.  Photic stimulation: Physiologic driving is not performed  EEG Abnormalities: None  Clinical Interpretation: This normal EEG is recorded in the waking and drowsy state. There was no seizure or seizure predisposition recorded on this study. Please note that lack of epileptiform activity on EEG does not preclude the possibility of epilepsy.   Ann Keto, MD [facility]   If 7pm- 7am, please page neurology on call as listed in [facility]ION.
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
## 2024-05-08 NOTE — Progress Notes (Signed)
 Eeg done

## 2024-05-08 NOTE — Plan of Care (Signed)

## 2024-05-08 NOTE — Progress Notes (Signed)
   05/08/24 1100  Spiritual Encounters  Type of Visit Initial;Attempt (pt unavailable)  Care provided to: Pt not available  Conversation partners present during encounter Social worker/Care management/TOC  Reason for visit Code  OnCall Visit Yes   Chaplain responded to a Code Stroke.  When Chaplain arrived, staff shared there were no family present.  Chaplain said she'd return later to see if spiritual care is needed.   Rev. Rana M. Nolon Baxter, M.Div. Chaplain Resident [facility]
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
## 2024-05-08 NOTE — Consult Note (Signed)
 NEUROLOGY CONSULT NOTE   Date of service: May 08, 2024 Patient Name: Carrie Larsen MRN:  [id number] DOB:  1946/03/04 Chief Complaint: Difficulty speaking Requesting Provider: Montey Apa, DO  History of Present Illness  Negar Sieler is a 78 y.o. female who is on lamotrigine  (unclear if for bipolar disorder or chronic pain, as I see both mentioned) who was admitted for abdominal pain and had a manual disimpaction this morning.  She was initially severely tearful and upset at which point she suddenly stopped talking.  She then had bilateral tremulous type movements and a code stroke was activated.   On my initial assessment, she had side-to-side no no type head shaking bilateral tremulous type movements of the arms which were readily distractible with repositioning and distraction.  She was able to follow commands, and following my assessment the shaking rapidly improved.  I had very low suspicion for acute ischemia, but wanted to expedite EEG and therefore ordered a stat EEG to rule out ongoing seizure activity, as well as a stat CT to be performed afterwards.  LKW: 10:30 am IV Thrombolysis: no, anticoagulation  NIHSS components Score: Comment  1a Level of Conscious 0[x]  1[]  2[]  3[]      1b LOC Questions 0[]  1[]  2[x]       1c LOC Commands 0[x]  1[]  2[]       2 Best Gaze 0[x]  1[]  2[]       3 Visual 0[x]  1[]  2[]  3[]      4 Facial Palsy 0[x]  1[]  2[]  3[]      5a Motor Arm - left 0[x]  1[]  2[]  3[]  4[]  UN[]    5b Motor Arm - Right 0[x]  1[]  2[]  3[]  4[]  UN[]    6a Motor Leg - Left 0[]  1[]  2[x]  3[]  4[]  UN[]    6b Motor Leg - Right 0[]  1[]  2[x]  3[]  4[]  UN[]    7 Limb Ataxia 0[x]  1[]  2[]  UN[]      8 Sensory 0[x]  1[]  2[]  UN[]      9 Best Language 0[]  1[]  2[]  3[x]      10 Dysarthria 0[]  1[]  2[x]  UN[]      11 Extinct. and Inattention 0[x]  1[]  2[]       TOTAL: 11       Past History   Past Medical History:  Diagnosis Date   Anxiety    Arthritis    osteoarthritis   CAD (coronary artery  disease)    a. 2012 ETT: no ischemia;  b. 06/2017 NSTEMI/PCI: LM nl, LAD nl, D1 70-80p, LCX nl, OM1 90 (3.0 x 23 Xience [place]), RCA dominant, 100p/m, fills via L->R collats, EF 55-65%.   Cancer [facility])    a. s/p partial left nephrectomy.   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 colon    Colon polyp 2011   COVID-19 07/2019   Diastolic dysfunction    a. 08/2015 Echo: EF 60-65%, no rwma, Gr1 DD, midlly dil LA, PASP . No significant valvular dzs; b. 06/2017 Echo: EF 60-65%, Gr1DD, mildly dil LA.   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   Obesity  Prediabetes    Pseudophakia of both eyes    Retinal cyst     Past Surgical History:  Procedure Laterality Date   ABDOMINAL HYSTERECTOMY     APPENDECTOMY  06/07/2015   CARDIAC CATHETERIZATION     CATARACT EXTRACTION W/PHACO Left 04/24/2016   Procedure: CATARACT EXTRACTION PHACO AND INTRAOCULAR LENS PLACEMENT (IOC);  Surgeon: Clair Crews, MD;  Location: [facility] ORS;  Service: Ophthalmology;  Laterality: Left;  US  45.8 AP% 16.4 CDE 7.53 FLUID PACK LOT # [id number]   CATARACT EXTRACTION W/PHACO Right 06/05/2016   Procedure: CATARACT EXTRACTION PHACO AND INTRAOCULAR LENS PLACEMENT (IOC);  Surgeon: Clair Crews, MD;  Location: [facility] ORS;  Service: Ophthalmology;  Laterality: Right;  US  25.9 AP% 21.9 CDE 5.68 Fluid pack lot # [id number]   COLON RESECTION  03/04/1991   COLON SURGERY     COLONOSCOPY  03-14-10   Dr Marquita Situ, tubular adenoma at 25 cm.   COLONOSCOPY WITH PROPOFOL  N/A 06/07/2015   Procedure: COLONOSCOPY WITH PROPOFOL ;  Surgeon: [name] Skeeter,  MD;  Location: [facility] ENDOSCOPY;  Service: Endoscopy;  Laterality: N/A;   COLONOSCOPY WITH PROPOFOL  N/A 08/10/2020   Procedure: COLONOSCOPY WITH PROPOFOL ;  Surgeon: [name] Skeeter, MD;  Location: [facility] ENDOSCOPY;  Service: Endoscopy;  Laterality: N/A;   CORONARY STENT INTERVENTION N/A 06/28/2017   Procedure: CORONARY STENT INTERVENTION;  Surgeon: Antonette Batters, MD;  Location: [facility] INVASIVE CV LAB;  Service: Cardiovascular;  Laterality: N/A;   DIALYSIS/PERMA CATHETER REMOVAL N/A 12/23/2023   Procedure: DIALYSIS/PERMA CATHETER REMOVAL;  Surgeon: Celso College, MD;  Location: [facility] INVASIVE CV LAB;  Service: Cardiovascular;  Laterality: N/A;   EYE SURGERY     lens eye surgery   HERNIA REPAIR  1993   umbilical   LEFT HEART CATH AND CORONARY ANGIOGRAPHY N/A 06/28/2017   Procedure: LEFT HEART CATH AND CORONARY ANGIOGRAPHY;  Surgeon: Devorah Fonder, MD;  Location: [facility] INVASIVE CV LAB;  Service: Cardiovascular;  Laterality: N/A;   LUMBAR LAMINECTOMY/DECOMPRESSION MICRODISCECTOMY Bilateral 03/10/2018   Procedure: Laminectomy and Foraminotomy - Lumbar three-Lumbar four - Lumbar four-Lumbar five - bilateral;  Surgeon: Agustina Aldrich, MD;  Location: [facility] OR;  Service: Neurosurgery;  Laterality: Bilateral;   NEPHRECTOMY  1992   left- cancer   PERIPHERAL VASCULAR THROMBECTOMY Right 10/01/2022   Procedure: PERIPHERAL VASCULAR THROMBECTOMY;  Surgeon: Celso College, MD;  Location: [facility] INVASIVE CV LAB;  Service: Cardiovascular;  Laterality: Right;   RIB RESECTION     removal 4 ribs   TONSILLECTOMY      Family History: Family History  Problem Relation Age of Onset   Colon cancer Mother    Cerebral aneurysm Father    Heart attack Father    Heart attack Brother 77   Healthy Son     Social History  reports that she has never smoked. She has never been exposed to tobacco smoke. She has never used smokeless tobacco. She reports that she does not drink alcohol and does not use drugs.  Allergies  Allergen  Reactions   Morphine  And Codeine Nausea Only   Prednisone Nausea And Vomiting   Amlodipine  Palpitations   Latex Rash    Medications   Current Facility-Administered Medications:    acetaminophen  (TYLENOL ) tablet 650 mg, 650 mg, Oral, Q6H PRN **OR** acetaminophen  (TYLENOL ) suppository 650 mg, 650 mg, Rectal, Q6H PRN, Antoniette Batty T, MD   ALPRAZolam (XANAX) tablet 0.25 mg, 0.25 mg, Oral, BID PRN, Darus Engels A, DO, 0.25 mg at 05/08/24 0950   apixaban  (ELIQUIS ) tablet  2.5 mg, 2.5 mg, Oral, BID, Antoniette Batty T, MD, 2.5 mg at 05/08/24 0949   atorvastatin  (LIPITOR ) tablet 80 mg, 80 mg, Oral, Daily, Zhang, Ping T, MD, 80 mg at 05/07/24 0902   azithromycin  (ZITHROMAX ) 500 mg in sodium chloride  0.9 % 250 mL IVPB, 500 mg, Intravenous, Q24H, Montey Apa, DO, Last Rate: 250 mL/hr at 05/07/24 1813, 500 mg at 05/07/24 1813   HYDROcodone -acetaminophen  (NORCO/VICODIN) 5-325 MG per tablet 1 tablet, 1 tablet, Oral, Q6H PRN, Darus Engels A, DO   insulin  aspart (novoLOG ) injection 0-15 Units, 0-15 Units, Subcutaneous, TID WC, Antoniette Batty T, MD, 3 Units at 2024-06-03 1333   iohexol (OMNIPAQUE) 9 MG/ML oral solution 500 mL, 500 mL, Oral, Q1H, Darus Engels A, DO   lamoTRIgine  (LAMICTAL ) tablet 150 mg, 150 mg, Oral, Daily, Jeane Miguel, Ping T, MD, 150 mg at 05/08/24 1021   LORazepam (ATIVAN) 2 MG/ML injection, , , ,    meropenem  (MERREM ) 1 g in sodium chloride  0.9 % 100 mL IVPB, 1 g, Intravenous, Q12H, Antoniette Batty T, MD, Last Rate: 200 mL/hr at 05/08/24 0936, 1 g at 05/08/24 0936   ondansetron  (ZOFRAN ) tablet 4 mg, 4 mg, Oral, Q6H PRN **OR** ondansetron  (ZOFRAN ) injection 4 mg, 4 mg, Intravenous, Q6H PRN, Antoniette Batty T, MD   pantoprazole  (PROTONIX ) EC tablet 40 mg, 40 mg, Oral, Daily, Antoniette Batty T, MD, 40 mg at 05/08/24 0950   polyethylene glycol (MIRALAX  / GLYCOLAX ) packet 17 g, 17 g, Oral, Daily, Antoniette Batty T, MD, 17 g at 05/08/24 0949   senna-docusate (Senokot-S) tablet 2 tablet, 2 tablet, Oral, Daily,  Antoniette Batty T, MD, 2 tablet at 05/08/24 0949   simethicone  (MYLICON) chewable tablet 80 mg, 80 mg, Oral, Daily, Antoniette Batty T, MD, 80 mg at 05/08/24 0935  Vitals   Vitals:   05/08/24 0418 05/08/24 0512 05/08/24 0800 05/08/24 1059  BP: (!) 103/40 (!) 109/48 (!) 113/55 (!) 140/69  Pulse: 80  78 (!) 109  Resp: 18  17   Temp: (!) 97.5 F (36.4 C) 97.9 F (36.6 C) 97.9 F (36.6 C)   TempSrc:  Oral Oral   SpO2: 97%  98% 95%  Weight:      Height:        Body mass index is 30.46 kg/m.   Physical Exam   Constitutional: Appears well-developed and well-nourished.  Neurologic Examination    Neuro: Mental Status: Patient is awake, but not speaking.  She has side-to-side head shaking with bilateral arm tremulousness that is clearly distractible.  She does follow commands even on my initial assessment to show two fingers bilaterally Cranial Nerves: II: She blinks to threat bilaterally. Pupils are equal, round, and reactive to light.   III,IV, VI: She tracks across midline in both directions VII: Facial movement is symmetric.  VIII: hearing is intact to voice Motor: She is able to hold bilateral arms aloft without drift, she has bilateral leg weakness at baseline, she is able to move both legs, but does not hold them against gravity. Sensory: She responds to noxious stimulation bilaterally  Cerebellar: She does not have any clear ataxia.       Labs/Imaging/Neurodiagnostic studies   CBC:  Recent Labs  Lab 06/03/2024 0423 05/07/24 0313  WBC 9.7 8.9  NEUTROABS 5.7  --   HGB 12.7 10.5*  HCT 39.3 33.9*  MCV 90.8 93.6  PLT 358 305   Basic Metabolic Panel:  Lab Results  Component Value Date   NA 141 05/07/2024  K 3.9 05/07/2024   CO2 23 05/07/2024   GLUCOSE 107 (H) 05/07/2024   BUN 32 (H) 05/07/2024   CREATININE 1.46 (H) 05/07/2024   CALCIUM  10.0 05/07/2024   GFRNONAA 37 (L) 05/07/2024   GFRAA 63 04/25/2021   Lipid Panel:  Lab Results  Component Value Date    LDLCALC 116 (H) 08/09/2022   HgbA1c:  Lab Results  Component Value Date   HGBA1C 5.6 05/06/2024    INR  Lab Results  Component Value Date   INR 0.97 06/27/2017   APTT  Lab Results  Component Value Date   APTT 24 06/27/2017    ASSESSMENT   Lester Platas is a 78 y.o. female with no history of seizures, who had an abnormal episode of difficulty speaking, tremulousness, etc. in the setting of an acute stressful episode of manual disimpaction.  She is not an IV tenecteplase candidate due to anticoagulation.  My suspicion is that this was likely a conversion reaction, however highest on my differential was partial seizure and therefore EEG was obtained emergently.  On subsequent reassessments, she continues to improve.  If EEG reveals evidence of seizure per disposition, then I would increase her lamotrigine , otherwise I would simply continue this at its current dose.  A CT to rule out ICH would be prudent, but my suspicion is relatively low for this.  She currently is continuing to improve.  RECOMMENDATIONS  EEG currently being performed Stat head CT following EEG As long as she continues to improve, I do not think any further testing would need to be done.  If she continues to obtained.  Have significant difficulty, then an MRI to rule out acute ischemic stroke could be obtained. ______________________________________________________________________    Signed, Ann Keto, MD [facility]

## 2024-05-08 NOTE — Progress Notes (Signed)
 Progress Note   Patient: Carrie Larsen MVH:[id number] DOB: December 11, 1945 DOA: 05/06/2024     0 DOS: the patient was seen and examined on 05/08/2024   Brief hospital course:  HPI on admission: Carrie Larsen is a 78 y.o. female with medical history significant of CAD status post stenting, recurrent UTI, history of DVT on Eliquis , chronic HFpEF, sent from nursing home for evaluation of worsening of UTI symptoms.   Patient had a UTI for sinus 3 weeks ago, when she developed dysuria and was treated with Macrobid .  Last Thursday/Friday, patient started to have burning sensations and suprapubic abdominal pain again, and she was started on Macrobid  again and 3 days later, urine culture came back and patient was switched to intramuscular meropenem .  So far she received 2 injections but she continued to feel severe burning sensations and cramping-like bladder pain.  She also feels nausea but no vomiting.  Denies any fever chills no back pains.  She feels so weak and malaise last 2 days.   ED Course: Afebrile, nontachycardic blood pressure 104/61 O2 saturation 97% on room air.  UA showed 3+ WBC and 3+ RBCs.  Patient was started on meropenem .  Blood work showed WBC 9.7 K3.3 BUN 35 creatinine 1.2 glucose 114.  Patient was admitted and started on IV antibiotics for UTI. Further hospital course and management as outlined below.  Hospital course complicated by severe abdominal pain and patient was found to have stercoral colitis and severe constipation throughout the colon on CT imaging.  After enema and attempt at manual disimpaction on 6/13, pt developed seizure-like activity as outlined below.      Assessment and Plan:  Complicated UTI with hematuria, failed outpatient management --Awaiting urine culture result from SNF - requested by admitting hospitalist --Pharmacy consulted on admission, IV meropenem  recommended.  If confirmed ESBL UTI, may need to consider PICC line and IV meropenem  therapy 10 to 14  days. --Repeat urine culture here is no growth  Stercoral colitis/proctitis Severe Constipation / Fecal Impaction Overflow Diarrhea - POA. Pt notes ongoing for about the past 4 months, with associated abdominal cramping pain prior to BM's.  Negative C diff & GI panel --D/c Imodium  --Escalate bowel regimen --Daily enema's --Miralax  BID, Senna-S BID, Milk of Mag daily, Dulcolax PO daily --GI curb-sided today, plan to see pt tomorrow - recommended continue bowel regimen and attempts and manual disimpaction  Left lower lobe Pneumonia  --Added Zithromax  for atypical coverage --Continue meropenem    Acute ambulatory impairment / generalized weakness Secondary to UTI, mgmt of UTI as above --PT evaluation --SNF recommended, TOC following for placement.  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  Seen for code stroke by Neurology.  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 EEG normal.  CT head non-acute --On Lamictal  for chronic pain vs mood disorders(?) - continue at current dose --No indication for seizure precautions   Hypokalemia K was replaced on admission --Monitor BMP, replace K as needed  Hx of CAD - stable, no chest pain, no ischemic EKG changes --Continue Eliquis , Coreg  and Imdur    History of gout - stable, no flare symptoms --Continue allopurinol    History of DVT --Continue Eliquis         Subjective: Pt seen with RN and NT at bedside during attempt at administering SMOG enema.  Enema fluid leaking out due to large amount of hard stool.  Pt laying on her left side, crying and moaning  in pain.  Digital disimpaction attempted with only small amounts able to be removed.  Pt suddenly became non-verbal moments after I left the room.  RN came to let me know, I return to room and patient with blank stare, not responding verbally or following commands as she has been  previously.  No tonic activity but some shaking in arms.  Code stroke was called and Neurologist arrived to bedside for assessment.  Patient given 1 mg IV Ativan.  She slowly became more response, would follow commands but not verbal.  Pt seen later this afternoon, awake sitting up in bed, eating dinner.  Feels better.   Physical Exam: Vitals:   05/08/24 0512 05/08/24 0800 05/08/24 1059 05/08/24 1554  BP: (!) 109/48 (!) 113/55 (!) 140/69 (!) 111/57  Pulse:  78 (!) 109 (!) 102  Resp:  17  16  Temp: 97.9 F (36.6 C) 97.9 F (36.6 C)  97.7 F (36.5 C)  TempSrc: Oral Oral    SpO2:  98% 95% 98%  Weight:      Height:       MORNING: Appears in severe pain, crying and moaning. Laying on side getting SMOG enema. Enema liquid leaking, not staying it, pink tinged fluid, no gross blood. On digital rectal exam and attempt at manual disimpaction -- large stool ball within rectum, small amounts of stool able to be removed.     AFTERNOON: General exam: awake, alert, no acute distress HEENT: moist mucus membranes, hearing grossly normal  Respiratory system: CTAB, no wheezes, rales or rhonchi, normal respiratory effort. Cardiovascular system: normal S1/S2, RRR, no pedal edema.   Gastrointestinal system: soft, NT, ND, no HSM felt, +bowel sounds. Central nervous system: A&O x3. no gross focal neurologic deficits, normal speech Extremities: moves all, no edema, normal tone Skin: dry, intact, normal temperature, normal color, No rashes, lesions or ulcers Psychiatry: depressed mood, congruent affect   Data Reviewed:  Notable labs --   Normal lactate 1.7, normal LDH 109 CBG's 87 >> 100 >> 119 >> 118  CT abdomen/pelvis repeated today -- persistent stool throughout colon with progression of circumferential rectal wall thickening consistent with stercoral colitis/proctitis. Moderate Right hydroureteronephrotis (new) Impression: 1. Prominent stool throughout the colon favors constipation.  Stool ball in the rectum with circumferential wall thickening in the rectum suspicious for stercoral colitis, single wall thickness 1.2 cm and previously 0.8 cm. Presacral and perirectal edema mildly increased from prior. Small perirectal lymph nodes. 2. Moderate right hydronephrosis and right hydroureter extending down to the UVJ without visualized obstructing stone. This hydronephrosis is new. Previously seen right renal calculi are not well seen today. A specific cause for the right hydronephrosis and hydroureter is not observed. 3. Left lateral abdominal wall hernia along the transverse abdominus muscle with herniation of a margin of splenic flexure. 4. Continued left lower lobe airspace opacity with overlying accentuated space between the seventh and eighth ribs with some deformity and invagination of subcutaneous tissues along the site. 5. Cholelithiasis. 6. Lower lumbar impingement. 7.  Aortic Atherosclerosis (ICD10-I70.0).  CT head -- no acute findings. Impression: Mild chronic microvascular ischemic changes and mild parenchymal volume loss.  6/12 -  C diff - negative GI panel - negative   Family Communication: None present, will attempt to call as time allows  Disposition: Status is: Inpatient Remains inpatient appropriate because:  severity of illness with multiple issues as above, on IV therapies   Planned Discharge Destination: Skilled nursing facility    Time spent: 65 minutes  Author: Montey Apa, DO 05/08/2024 5:10 PM  For on call review [email or web address].

## 2024-05-08 NOTE — Progress Notes (Signed)
 Patient was given enema per order. MD notified and called to bedside to assess patient as patient is fully compacted. MD attempted digital dis compaction. While cleaning/repositioning patient in bed patient became non-verbal. Vital sign taken, see doc flow. BG taken, see results. MD called to bedside again and code stroke was activated. Team arrived and 1 MG IV Ativan was given per MD order. EEG ordered STAT. Patient moved closer to nurses station for closer observation. Will continue to monitor closely.
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
## 2024-05-09 DIAGNOSIS — K5900 Constipation, unspecified: Secondary | ICD-10-CM

## 2024-05-09 DIAGNOSIS — K5641 Fecal impaction: Secondary | ICD-10-CM | POA: Diagnosis not present

## 2024-05-09 DIAGNOSIS — K5289 Other specified noninfective gastroenteritis and colitis: Secondary | ICD-10-CM | POA: Diagnosis not present

## 2024-05-09 DIAGNOSIS — F439 Reaction to severe stress, unspecified: Secondary | ICD-10-CM | POA: Diagnosis not present

## 2024-05-09 DIAGNOSIS — N3001 Acute cystitis with hematuria: Secondary | ICD-10-CM | POA: Diagnosis not present

## 2024-05-09 DIAGNOSIS — R4789 Other speech disturbances: Secondary | ICD-10-CM

## 2024-05-09 DIAGNOSIS — R1084 Generalized abdominal pain: Secondary | ICD-10-CM | POA: Diagnosis not present

## 2024-05-09 LAB — BASIC METABOLIC PANEL WITH GFR
Anion gap: 12 (ref 5–15)
BUN: 28 mg/dL — ABNORMAL HIGH (ref 8–23)
CO2: 22 mmol/L (ref 22–32)
Calcium: 10.4 mg/dL — ABNORMAL HIGH (ref 8.9–10.3)
Chloride: 109 mmol/L (ref 98–111)
Creatinine, Ser: 1.13 mg/dL — ABNORMAL HIGH (ref 0.44–1.00)
GFR, Estimated: 50 mL/min — ABNORMAL LOW (ref >60)
Glucose, Bld: 107 mg/dL — ABNORMAL HIGH (ref 70–99)
Potassium: 3.6 mmol/L (ref 3.5–5.1)
Sodium: 143 mmol/L (ref 135–145)

## 2024-05-09 LAB — GLUCOSE, CAPILLARY
Glucose-Capillary: 102 mg/dL — ABNORMAL HIGH (ref 70–99)
Glucose-Capillary: 140 mg/dL — ABNORMAL HIGH (ref 70–99)
Glucose-Capillary: 158 mg/dL — ABNORMAL HIGH (ref 70–99)
Glucose-Capillary: 112 mg/dL — ABNORMAL HIGH (ref 70–99)

## 2024-05-09 LAB — CBC
HCT: 36.7 % (ref 36.0–46.0)
Hemoglobin: 11.6 g/dL — ABNORMAL LOW (ref 12.0–15.0)
MCH: 29.3 pg (ref 26.0–34.0)
MCHC: 31.6 g/dL (ref 30.0–36.0)
MCV: 92.7 fL (ref 80.0–100.0)
Platelets: 329 10*3/uL (ref 150–400)
RBC: 3.96 MIL/uL (ref 3.87–5.11)
RDW: 13.7 % (ref 11.5–15.5)
WBC: 14 10*3/uL — ABNORMAL HIGH (ref 4.0–10.5)
nRBC: 0 % (ref 0.0–0.2)

## 2024-05-09 MED ORDER — SODIUM CHLORIDE 0.9 % IV BOLUS
250.0000 mL | Freq: Once | INTRAVENOUS | Status: AC
  Administered 2024-05-09: 250 mL via INTRAVENOUS

## 2024-05-09 NOTE — Consult Note (Addendum)
 Carrie Oz, MD [address]  [address]  [place], [place] [address]  Main: [phone]  Fax: [phone] Pager: [phone]   Consultation  Referring Provider:     No ref. provider found Primary Care Physician:  Velna Ghee, MD Primary Gastroenterologist:   Para Bold      Reason for Consultation: Severe constipation, fecal impaction, ?stercoral colitis  Date of Admission:  05/06/2024 Date of Consultation:  05/09/2024         HPI:   Carrie Larsen is a 78 y.o. female with history of coronary artery disease status post PCI, recurrent UTIs, DVT on Eliquis , chronic heart failure, preserved EF presented from nursing home with UTI symptoms, admitted for the same and receiving IV antibiotics.  Hospital course complicated by severe abdominal pain, CT revealed severe constipation and stercoral colitis.  Yesterday, after patient being receiving enema and manual disimpaction she developed seizure-like activity, evaluated by neurology, EEG was normal with no evidence of seizure recorded.  Did not recommend any further evaluation at this time.  GI is consulted for further evaluation.  Patient has been receiving Dulcolax suppositories and oral laxatives.  When I went to see the patient, she informed having several pudding-like bowel movements.  Her nurse tech also informed that she had a large BM with solid stool ball in the morning.  She has been having several episodes of fecal incontinence today. Patient reports having lower abdominal discomfort  NSAIDs: None  Antiplts/Anticoagulants/Anti thrombotics: Eliquis   GI Procedures:  Colonoscopy by Dr. Glenetta Lane on 08/10/2020 - One 5 mm polyp in the ascending colon. Biopsied. - One 12 mm polyp in the sigmoid colon, removed with a hot snare. Resected and retrieved. Clip ( MR conditional) was placed. - One 5 mm polyp in the rectum, removed with a hot snare. Resected and retrieved. DIAGNOSIS:  A. COLON POLYP, ASCENDING; COLD  BIOPSY:  - TUBULAR ADENOMA.  - NEGATIVE FOR HIGH-GRADE DYSPLASIA AND MALIGNANCY.   B. RECTAL POLYP AT 30 CM; HOT SNARE:  - TUBULAR ADENOMA.  - NEGATIVE FOR HIGH-GRADE DYSPLASIA AND MALIGNANCY.   C. RECTAL POLYP; HOT SNARE:  - FECAL MATERIAL.  - NO DEFINITE COLONIC EPITHELIUM IDENTIFIED.   Past Medical History:  Diagnosis Date   Anxiety    Arthritis    osteoarthritis   CAD (coronary artery disease)    a. 2012 ETT: no ischemia;  b. 06/2017 NSTEMI/PCI: LM nl, LAD nl, D1 70-80p, LCX nl, OM1 90 (3.0 x 23 Xience Alpine), RCA dominant, 100p/m, fills via L->R collats, EF 55-65%.   Cancer [facility])    a. s/p partial left nephrectomy.   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 colon    Colon polyp 2011   COVID-19 07/2019   Diastolic dysfunction    a. 08/2015 Echo: EF 60-65%, no rwma, Gr1 DD, midlly dil LA, PASP . No significant valvular dzs; b. 06/2017 Echo: EF 60-65%, Gr1DD, mildly dil LA.   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   Obesity    Prediabetes  Pseudophakia of both eyes    Retinal cyst     Past Surgical History:  Procedure Laterality Date   ABDOMINAL HYSTERECTOMY     APPENDECTOMY  06/07/2015   CARDIAC CATHETERIZATION     CATARACT EXTRACTION W/PHACO Left 04/24/2016   Procedure: CATARACT EXTRACTION PHACO AND INTRAOCULAR LENS PLACEMENT (IOC);  Surgeon: Clair Crews, MD;  Location: [facility] ORS;  Service: Ophthalmology;  Laterality: Left;  US  45.8 AP% 16.4 CDE 7.53 FLUID PACK LOT # [id number]   CATARACT EXTRACTION W/PHACO Right 06/05/2016   Procedure: CATARACT EXTRACTION PHACO AND  INTRAOCULAR LENS PLACEMENT (IOC);  Surgeon: Clair Crews, MD;  Location: [facility] ORS;  Service: Ophthalmology;  Laterality: Right;  US  25.9 AP% 21.9 CDE 5.68 Fluid pack lot # [id number]   COLON RESECTION  03/04/1991   COLON SURGERY     COLONOSCOPY  03-14-10   Dr Marquita Situ, tubular adenoma at 25 cm.   COLONOSCOPY WITH PROPOFOL  N/A 06/07/2015   Procedure: COLONOSCOPY WITH PROPOFOL ;  Surgeon: [name] Skeeter, MD;  Location: [facility] ENDOSCOPY;  Service: Endoscopy;  Laterality: N/A;   COLONOSCOPY WITH PROPOFOL  N/A 08/10/2020   Procedure: COLONOSCOPY WITH PROPOFOL ;  Surgeon: [name] Skeeter, MD;  Location: [facility] ENDOSCOPY;  Service: Endoscopy;  Laterality: N/A;   CORONARY STENT INTERVENTION N/A 06/28/2017   Procedure: CORONARY STENT INTERVENTION;  Surgeon: Antonette Batters, MD;  Location: [facility] INVASIVE CV LAB;  Service: Cardiovascular;  Laterality: N/A;   DIALYSIS/PERMA CATHETER REMOVAL N/A 12/23/2023   Procedure: DIALYSIS/PERMA CATHETER REMOVAL;  Surgeon: Celso College, MD;  Location: [facility] INVASIVE CV LAB;  Service: Cardiovascular;  Laterality: N/A;   EYE SURGERY     lens eye surgery   HERNIA REPAIR  1993   umbilical   LEFT HEART CATH AND CORONARY ANGIOGRAPHY N/A 06/28/2017   Procedure: LEFT HEART CATH AND CORONARY ANGIOGRAPHY;  Surgeon: Devorah Fonder, MD;  Location: [facility] INVASIVE CV LAB;  Service: Cardiovascular;  Laterality: N/A;   LUMBAR LAMINECTOMY/DECOMPRESSION MICRODISCECTOMY Bilateral 03/10/2018   Procedure: Laminectomy and Foraminotomy - Lumbar three-Lumbar four - Lumbar four-Lumbar five - bilateral;  Surgeon: Agustina Aldrich, MD;  Location: [facility] OR;  Service: Neurosurgery;  Laterality: Bilateral;   NEPHRECTOMY  1992   left- cancer   PERIPHERAL VASCULAR THROMBECTOMY Right 10/01/2022   Procedure: PERIPHERAL VASCULAR THROMBECTOMY;  Surgeon: Celso College, MD;  Location: [facility] INVASIVE CV LAB;  Service: Cardiovascular;  Laterality: Right;   RIB RESECTION     removal 4 ribs   TONSILLECTOMY        Current Facility-Administered Medications:    0.9 %  sodium chloride  infusion, , Intravenous, Continuous, Montey Apa, DO, Last Rate: 75 mL/hr at 05/09/24 1010, New Bag at 05/09/24 1010   acetaminophen  (TYLENOL ) tablet 650 mg, 650 mg, Oral, Q6H PRN, 650 mg at 05/08/24 1822 **OR** acetaminophen  (TYLENOL ) suppository 650 mg, 650 mg, Rectal, Q6H PRN, Antoniette Batty T, MD   ALPRAZolam (XANAX) tablet 0.25 mg, 0.25 mg, Oral, BID PRN, Darus Engels A, DO, 0.25 mg at 05/08/24 2144   apixaban  (ELIQUIS ) tablet 2.5 mg, 2.5 mg, Oral, BID, Antoniette Batty T, MD, 2.5 mg at 05/09/24 1914   atorvastatin  (LIPITOR ) tablet 80 mg, 80 mg, Oral, Daily, Jeane Miguel, Ping T, MD, 80 mg at 05/09/24 7829   azithromycin  (ZITHROMAX ) 500 mg in sodium chloride  0.9 % 250 mL IVPB, 500 mg, Intravenous, Q24H, Montey Apa, DO, Stopped at 05/08/24 1921   bisacodyl (DULCOLAX) EC tablet 5 mg, 5 mg, Oral, Daily, Darus Engels A, DO, 5 mg at 05/09/24  8119   HYDROcodone -acetaminophen  (NORCO/VICODIN) 5-325 MG per tablet 1 tablet, 1 tablet, Oral, Q6H PRN, Darus Engels A, DO   insulin  aspart (novoLOG ) injection 0-15 Units, 0-15 Units, Subcutaneous, TID WC, [place], MD, 3 Units at 05/06/24 1333   lamoTRIgine  (LAMICTAL ) tablet 150 mg, 150 mg, Oral, Daily, Jeane Miguel, Ping T, MD, 150 mg at 05/09/24 1001   magnesium  hydroxide (MILK OF MAGNESIA) suspension 15 mL, 15 mL, Oral, Daily, Darus Engels A, DO, 15 mL at 05/09/24 1478   meropenem  (MERREM ) 1 g in sodium chloride  0.9 % 100 mL IVPB, 1 g, Intravenous, Q12H, Antoniette Batty T, MD, Last Rate: 200 mL/hr at 05/09/24 1105, 1 g at 05/09/24 1105   ondansetron  (ZOFRAN ) tablet 4 mg, 4 mg, Oral, Q6H PRN **OR** ondansetron  (ZOFRAN ) injection 4 mg, 4 mg, Intravenous, Q6H PRN, Antoniette Batty T, MD   pantoprazole  (PROTONIX ) EC tablet 40 mg, 40 mg, Oral, Daily, Jeane Miguel, Ping T, MD, 40 mg at 05/09/24 2956   polyethylene glycol (MIRALAX  / GLYCOLAX ) packet 17 g, 17 g, Oral, BID, Darus Engels A, DO, 17 g  at 05/09/24 2130   senna-docusate (Senokot-S) tablet 2 tablet, 2 tablet, Oral, Daily, Antoniette Batty T, MD, 2 tablet at 05/09/24 8657   simethicone  (MYLICON) chewable tablet 80 mg, 80 mg, Oral, Daily, Antoniette Batty T, MD, 80 mg at 05/09/24 1104   Family History  Problem Relation Age of Onset   Colon cancer Mother    Cerebral aneurysm Father    Heart attack Father    Heart attack Brother 14   Healthy Son      Social History   Tobacco Use   Smoking status: Never    Passive exposure: Never   Smokeless tobacco: Never  Vaping Use   Vaping status: Never Used  Substance Use Topics   Alcohol use: No   Drug use: No    Allergies as of 05/06/2024 - Review Complete 05/06/2024  Allergen Reaction Noted   Morphine  and codeine Nausea Only 11/17/2013   Prednisone Nausea And Vomiting 08/09/2020   Amlodipine  Palpitations 08/31/2021   Latex Rash 10/22/2016    Review of Systems:    All systems reviewed and negative except where noted in HPI.   Physical Exam:  Vital signs in last 24 hours: Temp:  [97.7 F (36.5 C)-98.5 F (36.9 C)] 98.3 F (36.8 C) (06/14 0800) Pulse Rate:  [89-102] 89 (06/14 0800) Resp:  [16-20] 16 (06/14 0800) BP: (102-111)/(43-67) 111/46 (06/14 0800) SpO2:  [97 %-100 %] 99 % (06/14 0800) Last BM Date : 05/09/24 General:   Tearful, sad, cooperative in NAD Head:  Normocephalic and atraumatic. Eyes:   No icterus.   Conjunctiva pink. PERRLA. Ears:  Normal auditory acuity. Neck:  Supple; no masses or thyroidomegaly Lungs: Respirations even and unlabored. Lungs clear to auscultation bilaterally.   No wheezes, crackles, or rhonchi.  Heart:  Regular rate and rhythm;  Without murmur, clicks, rubs or gallops Abdomen:  Soft, nondistended, mild lower abdominal tenderness. Normal bowel sounds. No appreciable masses or hepatomegaly.  No rebound or guarding.  Rectal: Soft, pudding-like brown stool on the chuck, digital rectal exam confirmed large ball of stool in the rectum, the  stool ball was broken down into pieces and manual disimpaction was performed bedside with the nurse tech available Msk:  Symmetrical without gross deformities.  Strength generalized weakness Extremities:  Without edema, cyanosis or clubbing. Neurologic:  Alert and oriented x3;  grossly normal neurologically. Skin:  Intact without significant lesions or rashes. Psych:  Alert and cooperative. Normal affect.  LAB RESULTS:    Latest Ref Rng & Units 05/09/2024    2:25 AM 05/07/2024    3:13 AM 05/06/2024    4:23 AM  CBC  WBC 4.0 - 10.5 K/uL 14.0  8.9  9.7   Hemoglobin 12.0 - 15.0 g/dL 29.5  62.1  30.8   Hematocrit 36.0 - 46.0 % 36.7  33.9  39.3   Platelets 150 - 400 K/uL 329  305  358     BMET    Latest Ref Rng & Units 05/09/2024    2:25 AM 05/07/2024    3:13 AM 05/06/2024    4:23 AM  BMP  Glucose 70 - 99 mg/dL 657  846  962   BUN 8 - 23 mg/dL 28  32  35   Creatinine 0.44 - 1.00 mg/dL 9.52  8.41  3.24   Sodium 135 - 145 mmol/L 143  141  138   Potassium 3.5 - 5.1 mmol/L 3.6  3.9  3.3   Chloride 98 - 111 mmol/L 109  111  103   CO2 22 - 32 mmol/L 22  23  24    Calcium  8.9 - 10.3 mg/dL 40.1  02.7  25.3     LFT    Latest Ref Rng & Units 05/06/2024    4:23 AM 09/05/2023    7:42 PM 08/23/2022    1:11 PM  Hepatic Function  Total Protein 6.5 - 8.1 g/dL 6.7  7.5  6.5   Albumin 3.5 - 5.0 g/dL 3.5  3.1  4.2   AST 15 - 41 U/L 21  21  21    ALT 0 - 44 U/L 14  12  23    Alk Phosphatase 38 - 126 U/L 89  108  151   Total Bilirubin 0.0 - 1.2 mg/dL 0.5  0.5  0.3      STUDIES: CT ABDOMEN PELVIS W CONTRAST Result Date: 05/08/2024 CLINICAL DATA:  Abdominal pain.  Stercoral colitis. EXAM: CT ABDOMEN AND PELVIS WITH CONTRAST TECHNIQUE: Multidetector CT imaging of the abdomen and pelvis was performed using the standard protocol following bolus administration of intravenous contrast. RADIATION DOSE REDUCTION: This exam was performed according to the departmental dose-optimization program which includes  automated exposure control, adjustment of the mA and/or kV according to patient size and/or use of iterative reconstruction technique. CONTRAST:  75mL OMNIPAQUE IOHEXOL 350 MG/ML SOLN COMPARISON:  05/07/2019 FINDINGS: Lower chest: Coronary and aortic atherosclerosis with mild cardiomegaly. Continued left lower lobe airspace opacity with overlying accentuated space between the seventh and eighth ribs with some deformity and invagination of subcutaneous tissues along the site. Hepatobiliary: Cholelithiasis.  Otherwise unremarkable. Pancreas: Unremarkable Spleen: Unremarkable Adrenals/Urinary Tract: Adrenal glands normal. Left nephrectomy. 3.1 cm right kidney lower pole cyst on image 51 series 2. No further imaging workup of this lesion is indicated. Moderate right hydronephrosis and right hydroureter extending down to the UVJ without visualized obstructing stone. This hydronephrosis is new. Previously seen right renal calculi are not well seen today. Stomach/Bowel: Left lateral abdominal wall hernia along the transverse abdominis muscle with herniation of a margin of splenic flexure as on image 49 series 2. Prominent stool throughout the colon favors constipation. Stool ball in the rectum with circumferential wall thickening in the rectum suspicious for stercoral colitis, single wall thickness 1.2 cm and previously 0.8 cm. No current pneumatosis or portal venous gas. Mass to Modic staple line at the rectosigmoid junction. No dilated small bowel. Vascular/Lymphatic: Atherosclerosis is present, including  aortoiliac atherosclerotic disease. Atheromatous plaque at the origin of the celiac trunk. Right external iliac vein stent. Reproductive: Uterus absent. There is fluid along the vaginal vestibule, significance uncertain. Other: Presacral and perirectal edema mildly increased from prior. Small perirectal lymph nodes. No overt ascites. Musculoskeletal: Left posterolateral thoracic deformity is noted above. Left lateral  abdominal wall hernia through the transverse abdominus muscle as noted above. Thoracic spondylosis. Lower lumbar spondylosis and degenerative disc disease causing multilevel impingement particularly at L3-4, L4-5, and L5-S1. IMPRESSION: 1. Prominent stool throughout the colon favors constipation. Stool ball in the rectum with circumferential wall thickening in the rectum suspicious for stercoral colitis, single wall thickness 1.2 cm and previously 0.8 cm. Presacral and perirectal edema mildly increased from prior. Small perirectal lymph nodes. 2. Moderate right hydronephrosis and right hydroureter extending down to the UVJ without visualized obstructing stone. This hydronephrosis is new. Previously seen right renal calculi are not well seen today. A specific cause for the right hydronephrosis and hydroureter is not observed. 3. Left lateral abdominal wall hernia along the transverse abdominus muscle with herniation of a margin of splenic flexure. 4. Continued left lower lobe airspace opacity with overlying accentuated space between the seventh and eighth ribs with some deformity and invagination of subcutaneous tissues along the site. 5. Cholelithiasis. 6. Lower lumbar impingement. 7.  Aortic Atherosclerosis (ICD10-I70.0). Electronically Signed   By: Freida Jes M.D.   On: 05/08/2024 14:54   CT HEAD WO CONTRAST ( ) Result Date: 05/08/2024 CLINICAL DATA:  Neuro deficit, concern for stroke. Confusion 2 hours ago. EXAM: CT HEAD WITHOUT CONTRAST TECHNIQUE: Contiguous axial images were obtained from the base of the skull through the vertex without intravenous contrast. RADIATION DOSE REDUCTION: This exam was performed according to the departmental dose-optimization program which includes automated exposure control, adjustment of the mA and/or kV according to patient size and/or use of iterative reconstruction technique. COMPARISON:  CT head 05/06/2021. FINDINGS: Brain: No acute intracranial hemorrhage. No CT  evidence of acute infarct. Nonspecific hypoattenuation in the periventricular and subcortical white matter favored to reflect chronic microvascular ischemic changes. No edema, mass effect, or midline shift. The basilar cisterns are patent. Ventricles: Prominence of the ventricles suggesting underlying parenchymal volume loss. Vascular: Atherosclerotic calcifications of the carotid siphons and intracranial vertebral arteries. Slightly increased density of the vasculature likely related to recent contrast enhanced CT. Skull: No acute or aggressive finding. Orbits: Bilateral lens replacement. Sinuses: Mild mucosal thickening in the paranasal sinuses particularly in the right sphenoid and left maxillary sinuses. Other: Trace fluid in the mastoid air cells. IMPRESSION: No CT evidence of acute intracranial abnormality. Mild chronic microvascular ischemic changes and mild parenchymal volume loss. Electronically Signed   By: Denny Flack M.D.   On: 05/08/2024 14:28   EEG adult Result Date: 05/08/2024 Augustin Leber, MD     05/08/2024 12:41 PM History: 78 year old female with abrupt mental status change, evaluate for seizures EEG Duration: 22-minute Sedation: Ativan 1 mg given prior to EEG Patient State: Awake and drowsy Technique: This EEG was acquired with electrodes placed according to the International 10-20 electrode system (including Fp1, Fp2, F3, F4, C3, C4, P3, P4, O1, O2, T3, T4, T5, T6, A1, A2, Fz, Cz, Pz). The following electrodes were missing or displaced: none. Background: The background consists of intermixed alpha and beta activities. There is a well defined posterior dominant rhythm of 9-10 hz that attenuates with eye opening.  With drowsiness, there is anterior shifting of the posterior dominant rhythm, but well-formed sleep  is not recorded.  There was no epileptiform discharge seen. Photic stimulation: Physiologic driving is not performed EEG Abnormalities: None Clinical Interpretation: This  normal EEG is recorded in the waking and drowsy state. There was no seizure or seizure predisposition recorded on this study. Please note that lack of epileptiform activity on EEG does not preclude the possibility of epilepsy. Ann Keto, MD [facility] If 7pm- 7am, please page neurology on call as listed in AMION.  CT CHEST WO CONTRAST Result Date: 05/07/2024 CLINICAL DATA:  Abnormal chest x-ray with lung opacities EXAM: CT CHEST WITHOUT CONTRAST TECHNIQUE: Multidetector CT imaging of the chest was performed following the standard protocol without IV contrast. RADIATION DOSE REDUCTION: This exam was performed according to the departmental dose-optimization program which includes automated exposure control, adjustment of the mA and/or kV according to patient size and/or use of iterative reconstruction technique. COMPARISON:  CT chest June 27, 2017 chest x-ray May 06, 2024 FINDINGS: Cardiovascular: No significant vascular findings. Normal heart size. No pericardial effusion. Coronary artery calcifications Mediastinum/Nodes: No enlarged mediastinal or axillary lymph nodes. Thyroid  gland, trachea, and esophagus demonstrate no significant findings. Lungs/Pleura: Left lower lobe consolidative pneumonia with minimal left pleural reaction no significant effusion. Upper Abdomen: No acute abnormality. Musculoskeletal: No chest wall mass or suspicious bone lesions identified. IMPRESSION: Left lower lobe consolidative pneumonia with minimal left pleural reaction. Electronically Signed   By: Fredrich Jefferson M.D.   On: 05/07/2024 16:48      Impression / Plan:   Xia Stohr is a 78 y.o. female with history of coronary artery disease status post PCI, recurrent UTIs, DVT on Eliquis , chronic heart failure, preserved EF admitted with severe constipation and fecal impaction, CT revealed possible stercoral colitis  Severe constipation with fecal impaction Manual disimpaction was performed on 6/13 as  well as 6/14 with significant amount of stool evacuated Continue aggressive bowel regimen Avoid laxatives, minimize narcotic use Recommend MiraLAX  17 g twice daily as outpatient and close follow-up with GI/PCP upon discharge  Thank you for involving me in the care of this patient.  GI will sign off at this time, please call us  back with questions or concerns    LOS: 1 day   Ellis Guys, MD  05/09/2024, 2:29 PM    Note: This dictation was prepared with Dragon dictation along with smaller phrase technology. Any transcriptional errors that result from this process are unintentional.

## 2024-05-09 NOTE — Plan of Care (Signed)
  Problem: Clinical Measurements: Goal: Diagnostic test results will improve Outcome: Progressing   Problem: Coping: Goal: Level of anxiety will decrease Outcome: Progressing   Problem: Pain Managment: Goal: General experience of comfort will improve and/or be controlled Outcome: Progressing   Problem: Coping: Goal: Ability to adjust to condition or change in health will improve Outcome: Progressing

## 2024-05-09 NOTE — Progress Notes (Signed)
 Progress Note   Patient: Carrie Larsen ZOX:[id number] DOB: 04/27/1946 DOA: 05/06/2024     1 DOS: the patient was seen and examined on 05/09/2024   Brief hospital course:  HPI on admission: Carrie Larsen is a 78 y.o. female with medical history significant of CAD status post stenting, recurrent UTI, history of DVT on Eliquis , chronic HFpEF, sent from nursing home for evaluation of worsening of UTI symptoms.   Patient had a UTI for sinus 3 weeks ago, when she developed dysuria and was treated with Macrobid .  Last Thursday/Friday, patient started to have burning sensations and suprapubic abdominal pain again, and she was started on Macrobid  again and 3 days later, urine culture came back and patient was switched to intramuscular meropenem .  So far she received 2 injections but she continued to feel severe burning sensations and cramping-like bladder pain.  She also feels nausea but no vomiting.  Denies any fever chills no back pains.  She feels so weak and malaise last 2 days.   ED Course: Afebrile, nontachycardic blood pressure 104/61 O2 saturation 97% on room air.  UA showed 3+ WBC and 3+ RBCs.  Patient was started on meropenem .  Blood work showed WBC 9.7 K3.3 BUN 35 creatinine 1.2 glucose 114.  Patient was admitted and started on IV antibiotics for UTI. Further hospital course and management as outlined below.  Hospital course complicated by severe abdominal pain and patient was found to have stercoral colitis and severe constipation throughout the colon on CT imaging.  After enema and attempt at manual disimpaction on 6/13, pt developed seizure-like activity as outlined below.      Assessment and Plan:  Complicated UTI with hematuria, failed outpatient management SNF urine culture grew Proteus mirabilis sensitive to carbapenems, cefepime, unasyn, resistant to quionolones --Continue meropenem  to complete 5 days --Repeat urine culture here is no growth  Stercoral  colitis/proctitis Severe Constipation / Fecal Impaction Overflow Diarrhea - POA. Pt notes ongoing for about the past 4 months, with associated abdominal cramping pain prior to BM's.  Negative C diff & GI panel Manual disimpaction on 6/13, 6/14 --Avoid anti-diarrheals --Aggressive bowel regimen per orders, escalated  --GI consulted, appreciate recommendations --D/c on Miralax  17 g BID --Follow up with GI or PCP --Minimize opioids  Left lower lobe Pneumonia  --Added Zithromax  for atypical coverage --Continue meropenem    Acute ambulatory impairment / generalized weakness Secondary to UTI, mgmt of UTI as above --PT evaluation --SNF recommended, TOC following for placement.  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  Seen for code stroke by Neurology.  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 EEG normal.  CT head non-acute --On Lamictal  for chronic pain vs mood disorders(?) - continue at current dose --No indication for seizure precautions   Hypokalemia K was replaced on admission --Monitor BMP, replace K as needed  Hx of CAD - stable, no chest pain, no ischemic EKG changes --Continue Eliquis , Coreg  and Imdur    History of gout - stable, no flare symptoms --Continue allopurinol    History of DVT --Continue Eliquis         Subjective: Pt awake resting in bed this AM. She reports ongoing abdominal pain, gassy cramping pain, sharp at times.  No fever/chills. Still having rectal/anal pain as well.  Several BM's since yesterday, diarrhea.   Physical Exam: Vitals:   05/08/24 2054 05/09/24 0408 05/09/24 0800 05/09/24 1611  BP: (!) 107/43 102/67 (!) 111/46 (!) 100/38  Pulse: 95 96 89 87  Resp: 18 20 16 16   Temp: 98.4 F (36.9 C) 98.5 F (36.9 C) 98.3 F (36.8 C) 97.7 F (36.5 C)  TempSrc:   Oral Oral  SpO2: 100% 97% 99% 100%  Weight:      Height:       General exam:  awake, alert, no acute distress HEENT: moist mucus membranes, hearing grossly normal  Respiratory system: CTAB, no wheezes, rales or rhonchi, normal respiratory effort. Cardiovascular system: normal S1/S2, RRR, no pedal edema.   Gastrointestinal system: soft, NT, ND Central nervous system: A&O x3. no gross focal neurologic deficits, normal speech Extremities: moves all, no edema, normal tone Skin: dry, intact, normal temperature Psychiatry: depressed mood, congruent affect   Data Reviewed:  Notable labs --   Glucose 107 BUN 28 Cr 1.46 >> 1.13 Ca 10.4 WBC 8.9 >> 14.0   6/12 -  C diff - negative GI panel - negative   Family Communication: None present, will attempt to call as time allows  Disposition: Status is: Inpatient Remains inpatient appropriate because:  severity of illness as above, on IV therapies   Planned Discharge Destination: Skilled nursing facility    Time spent: 42 minutes  Author: Montey Apa, DO 05/09/2024 7:36 PM  For on call review [email or web address].

## 2024-05-09 NOTE — Progress Notes (Signed)
 NEUROLOGY CONSULT FOLLOW UP NOTE   Date of service: May 09, 2024 Patient Name: Carrie Larsen MRN:  [id number] DOB:  Apr 02, 1946  Interval Hx/subjective   She feels much improved Vitals   Vitals:   05/08/24 1554 05/08/24 2054 05/09/24 0408 05/09/24 0800  BP: (!) 111/57 (!) 107/43 102/67 (!) 111/46  Pulse: (!) 102 95 96 89  Resp: 16 18 20 16   Temp: 97.7 F (36.5 C) 98.4 F (36.9 C) 98.5 F (36.9 C) 98.3 F (36.8 C)  TempSrc:    Oral  SpO2: 98% 100% 97% 99%  Weight:      Height:         Body mass index is 30.46 kg/m.  Physical Exam   Constitutional: Appears well-developed and well-nourished.  Neurologic Examination    MS: Awake, alert, oriented.  She is able to name objects and her speech is fluent. CN: EOMI, VFF Motor: No drift in either upper extremity, she has bilateral leg weakness which she states is chronic   Medications  Current Facility-Administered Medications:    0.9 %  sodium chloride  infusion, , Intravenous, Continuous, Montey Apa, DO, Last Rate: 75 mL/hr at 05/09/24 1010, New Bag at 05/09/24 1010   acetaminophen  (TYLENOL ) tablet 650 mg, 650 mg, Oral, Q6H PRN, 650 mg at 05/08/24 1822 **OR** acetaminophen  (TYLENOL ) suppository 650 mg, 650 mg, Rectal, Q6H PRN, Antoniette Batty T, MD   ALPRAZolam (XANAX) tablet 0.25 mg, 0.25 mg, Oral, BID PRN, Darus Engels A, DO, 0.25 mg at 05/08/24 2144   apixaban  (ELIQUIS ) tablet 2.5 mg, 2.5 mg, Oral, BID, Zhang, Ping T, MD, 2.5 mg at 05/09/24 4098   atorvastatin  (LIPITOR ) tablet 80 mg, 80 mg, Oral, Daily, Jeane Miguel, Ping T, MD, 80 mg at 05/09/24 1191   azithromycin  (ZITHROMAX ) 500 mg in sodium chloride  0.9 % 250 mL IVPB, 500 mg, Intravenous, Q24H, Darus Engels A, DO, Stopped at 05/08/24 1921   bisacodyl (DULCOLAX) EC tablet 5 mg, 5 mg, Oral, Daily, Darus Engels A, DO, 5 mg at 05/09/24 4782   HYDROcodone -acetaminophen  (NORCO/VICODIN) 5-325 MG per tablet 1 tablet, 1 tablet, Oral, Q6H PRN, Darus Engels A, DO    insulin  aspart (novoLOG ) injection 0-15 Units, 0-15 Units, Subcutaneous, TID WC, [place], MD, 3 Units at 05/06/24 1333   lamoTRIgine  (LAMICTAL ) tablet 150 mg, 150 mg, Oral, Daily, Jeane Miguel, Ping T, MD, 150 mg at 05/09/24 1001   magnesium  hydroxide (MILK OF MAGNESIA) suspension 15 mL, 15 mL, Oral, Daily, Darus Engels A, DO, 15 mL at 05/09/24 0953   meropenem  (MERREM ) 1 g in sodium chloride  0.9 % 100 mL IVPB, 1 g, Intravenous, Q12H, Antoniette Batty T, MD, Last Rate: 200 mL/hr at 05/09/24 1105, 1 g at 05/09/24 1105   ondansetron  (ZOFRAN ) tablet 4 mg, 4 mg, Oral, Q6H PRN **OR** ondansetron  (ZOFRAN ) injection 4 mg, 4 mg, Intravenous, Q6H PRN, Antoniette Batty T, MD   pantoprazole  (PROTONIX ) EC tablet 40 mg, 40 mg, Oral, Daily, Jeane Miguel, Ping T, MD, 40 mg at 05/09/24 0953   polyethylene glycol (MIRALAX  / GLYCOLAX ) packet 17 g, 17 g, Oral, BID, Darus Engels A, DO, 17 g at 05/09/24 9562   senna-docusate (Senokot-S) tablet 2 tablet, 2 tablet, Oral, Daily, Antoniette Batty T, MD, 2 tablet at 05/09/24 0953   simethicone  (MYLICON) chewable tablet 80 mg, 80 mg, Oral, Daily, Antoniette Batty T, MD, 80 mg at 05/09/24 1104  Labs and Diagnostic Imaging   Creatinine 1.13  Imaging(Personally reviewed): CT head is negative  EEG was normal  Assessment   Caren Garske is a 78 y.o. female with a transient episode of speech difficulty in the setting of a very stressful situation.  My suspicion based on my initial exam is that this was likely nonorganic in nature.  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  At this point, I think further testing would be unlikely to yield further results, and I would only investigate further if she were to have further symptoms.  Recommendations  Neurology will be available on an as-needed basis, please call if further questions or  concerns ______________________________________________________________________   Signed, Ann Keto, MD [facility]

## 2024-05-10 DIAGNOSIS — K5289 Other specified noninfective gastroenteritis and colitis: Secondary | ICD-10-CM | POA: Diagnosis not present

## 2024-05-10 DIAGNOSIS — R262 Difficulty in walking, not elsewhere classified: Secondary | ICD-10-CM | POA: Diagnosis not present

## 2024-05-10 DIAGNOSIS — R1084 Generalized abdominal pain: Secondary | ICD-10-CM | POA: Diagnosis not present

## 2024-05-10 DIAGNOSIS — N3001 Acute cystitis with hematuria: Secondary | ICD-10-CM | POA: Diagnosis not present

## 2024-05-10 LAB — BASIC METABOLIC PANEL WITH GFR
Anion gap: 6 (ref 5–15)
BUN: 28 mg/dL — ABNORMAL HIGH (ref 8–23)
CO2: 22 mmol/L (ref 22–32)
Calcium: 9.5 mg/dL (ref 8.9–10.3)
Chloride: 110 mmol/L (ref 98–111)
Creatinine, Ser: 1.06 mg/dL — ABNORMAL HIGH (ref 0.44–1.00)
GFR, Estimated: 54 mL/min — ABNORMAL LOW (ref >60)
Glucose, Bld: 98 mg/dL (ref 70–99)
Potassium: 3.5 mmol/L (ref 3.5–5.1)
Sodium: 138 mmol/L (ref 135–145)

## 2024-05-10 LAB — GLUCOSE, CAPILLARY
Glucose-Capillary: 96 mg/dL (ref 70–99)
Glucose-Capillary: 123 mg/dL — ABNORMAL HIGH (ref 70–99)
Glucose-Capillary: 140 mg/dL — ABNORMAL HIGH (ref 70–99)
Glucose-Capillary: 116 mg/dL — ABNORMAL HIGH (ref 70–99)

## 2024-05-10 LAB — CBC
HCT: 30.5 % — ABNORMAL LOW (ref 36.0–46.0)
Hemoglobin: 9.9 g/dL — ABNORMAL LOW (ref 12.0–15.0)
MCH: 29.9 pg (ref 26.0–34.0)
MCHC: 32.5 g/dL (ref 30.0–36.0)
MCV: 92.1 fL (ref 80.0–100.0)
Platelets: 287 10*3/uL (ref 150–400)
RBC: 3.31 MIL/uL — ABNORMAL LOW (ref 3.87–5.11)
RDW: 13.9 % (ref 11.5–15.5)
WBC: 9.8 10*3/uL (ref 4.0–10.5)
nRBC: 0 % (ref 0.0–0.2)

## 2024-05-10 MED ORDER — SODIUM CHLORIDE 0.9 % IV SOLN
1.0000 g | Freq: Three times a day (TID) | INTRAVENOUS | Status: AC
  Administered 2024-05-10 (×2): 1 g via INTRAVENOUS
  Filled 2024-05-10 (×4): qty 20

## 2024-05-10 MED ORDER — AZITHROMYCIN 500 MG PO TABS
500.0000 mg | ORAL_TABLET | Freq: Every day | ORAL | Status: AC
  Administered 2024-05-10: 500 mg via ORAL
  Filled 2024-05-10 (×2): qty 1

## 2024-05-10 NOTE — Progress Notes (Signed)
 Progress Note   Patient: Carrie Larsen QIO:[id number] DOB: August 18, 1946 DOA: 05/06/2024     2 DOS: the patient was seen and examined on 05/10/2024   Brief hospital course:  HPI on admission: Carrie Larsen is a 78 y.o. female with medical history significant of CAD status post stenting, recurrent UTI, history of DVT on Eliquis , chronic HFpEF, sent from nursing home for evaluation of worsening of UTI symptoms.   Patient had a UTI for sinus 3 weeks ago, when she developed dysuria and was treated with Macrobid .  Last Thursday/Friday, patient started to have burning sensations and suprapubic abdominal pain again, and she was started on Macrobid  again and 3 days later, urine culture came back and patient was switched to intramuscular meropenem .  So far she received 2 injections but she continued to feel severe burning sensations and cramping-like bladder pain.  She also feels nausea but no vomiting.  Denies any fever chills no back pains.  She feels so weak and malaise last 2 days.   ED Course: Afebrile, nontachycardic blood pressure 104/61 O2 saturation 97% on room air.  UA showed 3+ WBC and 3+ RBCs.  Patient was started on meropenem .  Blood work showed WBC 9.7 K3.3 BUN 35 creatinine 1.2 glucose 114.  Patient was admitted and started on IV antibiotics for UTI. Further hospital course and management as outlined below.  Hospital course complicated by severe abdominal pain and patient was found to have stercoral colitis and severe constipation throughout the colon on CT imaging.  After enema and attempt at manual disimpaction on 6/13, pt developed seizure-like activity as outlined below.      Assessment and Plan:  Complicated UTI with hematuria, failed outpatient management SNF urine culture grew Proteus mirabilis sensitive to carbapenems, cefepime, unasyn, resistant to quionolones --Continue meropenem  to complete 5 days --Repeat urine culture here is no growth  Stercoral  colitis/proctitis Severe Constipation / Fecal Impaction Overflow Diarrhea - POA. Pt notes ongoing for about the past 4 months, with associated abdominal cramping pain prior to BM's.  Negative C diff & GI panel Manual disimpaction on 6/13, 6/14 --Avoid anti-diarrheals --Aggressive bowel regimen per orders, escalated  --GI consulted, appreciate recommendations --D/c on Miralax  17 g BID --Follow up with GI or PCP --Minimize opioids  Left lower lobe Pneumonia  --Added Zithromax  for atypical coverage --Continue meropenem    Acute ambulatory impairment / generalized weakness Secondary to UTI, mgmt of UTI as above --PT evaluation --SNF recommended, TOC following for placement.  TOC notified patient requesting to attempt placement at new facility, do not see that bed search process has been initiated, consulted placed on admission.  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  Seen for code stroke by Neurology.  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 EEG normal.  CT head non-acute --On Lamictal  for chronic pain vs mood disorders(?) - continue at current dose --No indication for seizure precautions   Hypokalemia K was replaced on admission --Monitor BMP, replace K as needed  Hx of CAD - stable, no chest pain, no ischemic EKG changes --Continue Eliquis , Coreg  and Imdur    History of gout - stable, no flare symptoms --Continue allopurinol    History of DVT --Continue Eliquis         Subjective: Pt awake resting in bed this AM. She reports persistent abdominal pain and diarrhea, lots of gas as well.  Otherwise she reports feeling much better.  She continues to state she  really does not want to return to [employer].  Asking to talk to Case Manager.   Physical Exam: Vitals:   05/10/24 0100 05/10/24 0200 05/10/24 0400 05/10/24 0822  BP: (!) 112/49 (!) 102/56 109/61 (!)  114/55  Pulse:   89 87  Resp:   20 17  Temp:   99.2 F (37.3 C) 98.6 F (37 C)  TempSrc:   Oral   SpO2:   97% 97%  Weight:      Height:       General exam: awake, alert, no acute distress HEENT: moist mucus membranes, hearing grossly normal  Respiratory system: on room air, normal respiratory effort. Cardiovascular system: RRR, no pedal edema.   Gastrointestinal system: soft, mild diffuse tenderness without rebound or guarding Central nervous system: A&O x3. no gross focal neurologic deficits, normal speech Extremities: moves all, no edema, normal tone Skin: dry, intact, normal temperature Psychiatry: depressed mood, congruent affect   Data Reviewed:  Notable labs --   BUN 28 Cr 1.46 >> 1.13 >> 1.06  WBC 8.9 >> 14.0 >> 9.8 Hbg 11.6 >> 9.9 (suspect dilution with IV fluids)   6/12 -  C diff - negative GI panel - negative   Family Communication: None present, will attempt to call as time allows  Disposition: Status is: Inpatient Remains inpatient appropriate because:  severity of illness as above, on IV therapies   Planned Discharge Destination: Skilled nursing facility    Time spent: 38 minutes  Author: Montey Apa, DO 05/10/2024 4:12 PM  For on call review [email or web address].

## 2024-05-10 NOTE — Progress Notes (Signed)
 PHARMACY NOTE:  ANTIMICROBIAL RENAL DOSAGE ADJUSTMENT  Current antimicrobial regimen includes a mismatch between antimicrobial dosage and estimated renal function.  As per policy approved by the Pharmacy & Therapeutics and Medical Executive Committees, the antimicrobial dosage will be adjusted accordingly.  Current antimicrobial dosage:  meropenem  1 gram IV every 12 hours  Indication: UTI  Renal Function:  Estimated Creatinine Clearance: 55 mL/min (A) (by C-G formula based on SCr of 1.06 mg/dL (H)).    Antimicrobial dosage has been changed to:  meropenem  1 gram IV every 8 hours  Thank you for allowing pharmacy to be a part of this patient's care.  Adalberto Acton, [facility] 05/10/2024 9:30 AM

## 2024-05-11 DIAGNOSIS — K5641 Fecal impaction: Secondary | ICD-10-CM | POA: Diagnosis not present

## 2024-05-11 DIAGNOSIS — R1084 Generalized abdominal pain: Secondary | ICD-10-CM | POA: Diagnosis not present

## 2024-05-11 DIAGNOSIS — K5289 Other specified noninfective gastroenteritis and colitis: Secondary | ICD-10-CM | POA: Diagnosis not present

## 2024-05-11 LAB — GLUCOSE, CAPILLARY
Glucose-Capillary: 96 mg/dL (ref 70–99)
Glucose-Capillary: 109 mg/dL — ABNORMAL HIGH (ref 70–99)
Glucose-Capillary: 106 mg/dL — ABNORMAL HIGH (ref 70–99)
Glucose-Capillary: 102 mg/dL — ABNORMAL HIGH (ref 70–99)

## 2024-05-11 LAB — BASIC METABOLIC PANEL WITH GFR
Anion gap: 7 (ref 5–15)
BUN: 23 mg/dL (ref 8–23)
CO2: 23 mmol/L (ref 22–32)
Calcium: 9.6 mg/dL (ref 8.9–10.3)
Chloride: 111 mmol/L (ref 98–111)
Creatinine, Ser: 1.04 mg/dL — ABNORMAL HIGH (ref 0.44–1.00)
GFR, Estimated: 55 mL/min — ABNORMAL LOW (ref >60)
Glucose, Bld: 104 mg/dL — ABNORMAL HIGH (ref 70–99)
Potassium: 3.7 mmol/L (ref 3.5–5.1)
Sodium: 141 mmol/L (ref 135–145)

## 2024-05-11 LAB — CULTURE, BLOOD (ROUTINE X 2)
Culture: NO GROWTH
Culture: NO GROWTH
Special Requests: ADEQUATE

## 2024-05-11 NOTE — Plan of Care (Signed)

## 2024-05-11 NOTE — Plan of Care (Signed)
  Problem: Clinical Measurements: Goal: Ability to maintain clinical measurements within normal limits will improve Outcome: Progressing Goal: Diagnostic test results will improve Outcome: Progressing   Problem: Nutrition: Goal: Adequate nutrition will be maintained Outcome: Progressing   Problem: Coping: Goal: Level of anxiety will decrease Outcome: Progressing   Problem: Pain Managment: Goal: General experience of comfort will improve and/or be controlled Outcome: Progressing   Problem: Coping: Goal: Ability to adjust to condition or change in health will improve Outcome: Progressing

## 2024-05-11 NOTE — Progress Notes (Signed)
 Progress Note   Patient: Carrie Larsen ZOX:[id number] DOB: 1945/12/02 DOA: 05/06/2024     3 DOS: the patient was seen and examined on 05/11/2024   Brief hospital course:  HPI on admission: Carrie Larsen is a 78 y.o. female with medical history significant of CAD status post stenting, recurrent UTI, history of DVT on Eliquis , chronic HFpEF, sent from nursing home for evaluation of worsening of UTI symptoms.   Patient had a UTI for sinus 3 weeks ago, when she developed dysuria and was treated with Macrobid .  Last Thursday/Friday, patient started to have burning sensations and suprapubic abdominal pain again, and she was started on Macrobid  again and 3 days later, urine culture came back and patient was switched to intramuscular meropenem .  So far she received 2 injections but she continued to feel severe burning sensations and cramping-like bladder pain.  She also feels nausea but no vomiting.  Denies any fever chills no back pains.  She feels so weak and malaise last 2 days.   ED Course: Afebrile, nontachycardic blood pressure 104/61 O2 saturation 97% on room air.  UA showed 3+ WBC and 3+ RBCs.  Patient was started on meropenem .  Blood work showed WBC 9.7 K3.3 BUN 35 creatinine 1.2 glucose 114.  Patient was admitted and started on IV antibiotics for UTI. Further hospital course and management as outlined below.  Hospital course complicated by severe abdominal pain and patient was found to have stercoral colitis and severe constipation throughout the colon on CT imaging.  After enema and attempt at manual disimpaction on 6/13, pt developed seizure-like activity as outlined below.      Assessment and Plan:  Complicated UTI with hematuria, failed outpatient management SNF urine culture grew Proteus mirabilis sensitive to carbapenems, cefepime, unasyn, resistant to quionolones --Continue meropenem  to complete 5 days --Repeat urine culture here is no growth  Stercoral  colitis/proctitis Severe Constipation / Fecal Impaction Overflow Diarrhea - POA. Pt notes ongoing for about the past 4 months, with associated abdominal cramping pain prior to BM's.  Negative C diff & GI panel Manual disimpaction on 6/13, 6/14 Improving slowly with aggressive bowel regimen --Avoid anti-diarrheals --Aggressive bowel regimen per orders --May repeat imaging tomorrow to reassess --GI consulted, appreciate recommendations --D/c on Miralax  17 g BID --Follow up with GI or PCP --Minimize opioids  Left lower lobe Pneumonia  --Added Zithromax  for atypical coverage --Continue meropenem    Acute ambulatory impairment / generalized weakness Secondary to UTI, mgmt of UTI as above --PT evaluation --SNF recommended, TOC following for placement.  TOC notified patient requesting to attempt placement at new facility, do not see that bed search process has been initiated, consulted placed on admission.  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  Seen for code stroke by Neurology.  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 EEG normal.  CT head non-acute --On Lamictal  for chronic pain vs mood disorders(?) - continue at current dose --No indication for seizure precautions   Hypokalemia K was replaced on admission --Monitor BMP, replace K as needed  Hx of CAD - stable, no chest pain, no ischemic EKG changes --Continue Eliquis , Coreg  and Imdur    History of gout - stable, no flare symptoms --Continue allopurinol    History of DVT --Continue Eliquis         Subjective: Pt awake resting in bed this AM. She reports ongoing gas and loose stools, having multiple BM's daily. Abdomen still hurts but  has improved some.  Not having the severe rectal/anal pain like she was before.   Physical Exam: Vitals:   05/10/24 1656 05/10/24 2003 05/11/24 0403 05/11/24 0752  BP: (!) 110/54 (!)  105/53 (!) 106/50 (!) 113/51  Pulse: 82 82 77 80  Resp: 17 18 19 17   Temp: 98.4 F (36.9 C) 98 F (36.7 C) 98.2 F (36.8 C) 98.3 F (36.8 C)  TempSrc:      SpO2: 98% 99% 98% 97%  Weight:      Height:       General exam: awake, alert, no acute distress HEENT: moist mucus membranes, hearing grossly normal  Respiratory system: on room air, normal respiratory effort. Cardiovascular system: RRR, no pedal edema.   Gastrointestinal system: soft, non-tender, no rebound or guarding, +bowel sounds Central nervous system: A&O x3. no gross focal neurologic deficits, normal speech Extremities: moves all, no edema, normal tone Skin: dry, intact, normal temperature Psychiatry: depressed mood, congruent affect   Data Reviewed:  Notable labs --   BUN 28 Cr 1.46 >> 1.13 >> 1.06 >> 1.04   Last Hbg 11.6 >> 9.9 (suspect dilution with IV fluids)   6/12 -  C diff - negative GI panel - negative   Family Communication: None present, will attempt to call as time allows  Disposition: Status is: Inpatient Remains inpatient appropriate because: continuing laxatives for severe fecal impaction/constipation, needs 2-3 more days.  Needs new SNF placement for d/c.    Planned Discharge Destination: Skilled nursing facility    Time spent: 35 minutes  Author: Montey Apa, DO 05/11/2024 1:11 PM  For on call review [email or web address].
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
## 2024-05-12 ENCOUNTER — Inpatient Hospital Stay

## 2024-05-12 DIAGNOSIS — R1084 Generalized abdominal pain: Secondary | ICD-10-CM | POA: Diagnosis not present

## 2024-05-12 DIAGNOSIS — K5641 Fecal impaction: Secondary | ICD-10-CM | POA: Diagnosis not present

## 2024-05-12 DIAGNOSIS — K5289 Other specified noninfective gastroenteritis and colitis: Secondary | ICD-10-CM | POA: Diagnosis not present

## 2024-05-12 LAB — GLUCOSE, CAPILLARY
Glucose-Capillary: 100 mg/dL — ABNORMAL HIGH (ref 70–99)
Glucose-Capillary: 132 mg/dL — ABNORMAL HIGH (ref 70–99)
Glucose-Capillary: 127 mg/dL — ABNORMAL HIGH (ref 70–99)
Glucose-Capillary: 104 mg/dL — ABNORMAL HIGH (ref 70–99)

## 2024-05-12 LAB — CBC
HCT: 30.5 % — ABNORMAL LOW (ref 36.0–46.0)
Hemoglobin: 9.6 g/dL — ABNORMAL LOW (ref 12.0–15.0)
MCH: 29.1 pg (ref 26.0–34.0)
MCHC: 31.5 g/dL (ref 30.0–36.0)
MCV: 92.4 fL (ref 80.0–100.0)
Platelets: 322 10*3/uL (ref 150–400)
RBC: 3.3 MIL/uL — ABNORMAL LOW (ref 3.87–5.11)
RDW: 13.7 % (ref 11.5–15.5)
WBC: 6.5 10*3/uL (ref 4.0–10.5)
nRBC: 0 % (ref 0.0–0.2)

## 2024-05-12 LAB — BASIC METABOLIC PANEL WITH GFR
Anion gap: 6 (ref 5–15)
BUN: 23 mg/dL (ref 8–23)
CO2: 25 mmol/L (ref 22–32)
Calcium: 9.4 mg/dL (ref 8.9–10.3)
Chloride: 109 mmol/L (ref 98–111)
Creatinine, Ser: 1.06 mg/dL — ABNORMAL HIGH (ref 0.44–1.00)
GFR, Estimated: 54 mL/min — ABNORMAL LOW (ref >60)
Glucose, Bld: 106 mg/dL — ABNORMAL HIGH (ref 70–99)
Potassium: 3.7 mmol/L (ref 3.5–5.1)
Sodium: 140 mmol/L (ref 135–145)

## 2024-05-12 LAB — MAGNESIUM: Magnesium: 2.4 mg/dL (ref 1.7–2.4)

## 2024-05-12 NOTE — Progress Notes (Signed)
 PT Cancellation Note  Patient Details Name: Carrie Larsen MRN: [id number] DOB: [birth year]   Cancelled Treatment:     PT attempt 2 x this date. First attempt, pt unwilling requesting author return at a later time. 2nd attempt, pt sleeping soundly and elected not to disturb due to lack of sleep previous night. Acute PT will continue to follow and progress per current POC.    Koleen Perna 05/12/2024, 1:58 PM

## 2024-05-12 NOTE — TOC Initial Note (Signed)
 Transition of Care [facility]) - Initial/Assessment Note    Patient Details  Name: Carrie Larsen MRN: [id number] Date of Birth: [birth year]  Transition of [facility]) CM/SW Contact:    Alexandra Ice, RN Phone Number: 05/12/2024, 4:09 PM  Clinical Narrative:                  Patient is long-term resident at [facility], spoke with Fabian Holster at [facility], confirms she is LTC at facility. He spoke with patient yesterday and agreeable to return.         Patient Goals and CMS Choice            Expected Discharge Plan and Services                                              Prior Living Arrangements/Services                       Activities of Daily Living   ADL Screening (condition at time of admission) Independently performs ADLs?: No Does the patient have a NEW difficulty with bathing/dressing/toileting/self-feeding that is expected to last >3 days?: No Does the patient have a NEW difficulty with getting in/out of bed, walking, or climbing stairs that is expected to last >3 days?: No Does the patient have a NEW difficulty with communication that is expected to last >3 days?: No Is the patient deaf or have difficulty hearing?: No Does the patient have difficulty seeing, even when wearing glasses/contacts?: No Does the patient have difficulty concentrating, remembering, or making decisions?: No  Permission Sought/Granted                  Emotional Assessment              Admission diagnosis:  Hypokalemia [E87.6] Lower urinary tract infectious disease [N39.0] UTI (urinary tract infection) [N39.0] Urinary tract infection [N39.0] Generalized abdominal pain [R10.84] Flank pain [R10.9] Patient Active Problem List   Diagnosis Date Noted   Fecal impaction (HCC) 05/09/2024   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   Constipation 05/08/2024   CAP (community acquired pneumonia) 05/08/2024   H/O left nephrectomy 05/08/2024   Seizures (HCC)  05/08/2024   Altered mental status 05/08/2024   Overflow diarrhea 05/07/2024   Ambulatory dysfunction 05/07/2024   Hypokalemia 05/07/2024   Urinary tract infection 05/06/2024   UTI (urinary tract infection) 05/06/2024   Pain in limb 03/26/2023   Degenerative spondylolisthesis 10/11/2022   History of DVT (deep vein thrombosis) 09/25/2022   Elevated blood sugar 08/09/2022   Gout involving toe 08/09/2022   Coronary artery disease of native artery of native heart with stable angina pectoris (HCC) 07/26/2022   Class 2 severe obesity with serious comorbidity and body mass index (BMI) of 38.0 to 38.9 in adult (HCC) 07/26/2022   Pain in left lower leg 07/26/2022   Lower limb pain, inferior, left 05/30/2022   Open wound of left lower leg due to cat bite 04/24/2022   Injury of left lower leg 04/01/2022   Osteoarthritis of left knee 10/05/2021   Pain in joint of left knee 10/05/2021   Personal history of kidney stones 12/25/2019   Choroidal nevus, 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   CME (cystoid macular edema), left 11/20/2018   Nonexudative age-related macular degeneration, bilateral, early dry stage 11/20/2018   Pseudophakia of both eyes 11/20/2018  Olecranon bursitis 10/16/2018   Contusion of knee 10/16/2018   Arthritis of knee 10/16/2018   Arthritis of hand 10/16/2018   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/10/2018   Obesity 09/11/2017   Abdominal pain 02/28/2017   History of renal cell carcinoma 02/20/2017   Knee pain 08/14/2016   Strain of hamstring muscle 08/08/2016   Left leg swelling 08/22/2015   Systolic murmur    Retrosternal chest pain 07/08/2015   History of colonic polyps 04/16/2015   Chest pain 03/08/2011   Hyperlipidemia 03/08/2011   HTN (hypertension) 03/08/2011   PCP:  Velna Ghee, MD Pharmacy:   [facility] [address] ([place]), [place] - [address] [place] [address] Phone: [phone] Fax:  [phone]  [facility] - [place], [place] - [address] [place] [address] Phone: [phone] Fax: [phone]  [facility] - [place], [place] - [name] [place] [address] Phone: [phone] Fax: [phone]  [facility]) [phone] - [place], [place] - [address] AT [facility] [address] [place] [address] Phone: [phone] Fax: [phone]     Social Drivers of Health (SDOH) Social History: SDOH Screenings   Food Insecurity: No Food Insecurity (05/06/2024)  Housing: Low Risk  (05/06/2024)  Transportation Needs: No Transportation Needs (05/06/2024)  Utilities: Not At Risk (05/06/2024)  Alcohol Screen: Low Risk  (02/27/2023)  Depression (PHQ2-9): Low Risk  (02/27/2023)  Financial Resource Strain: Patient Unable To Answer (07/04/2023)   Received from [facility]  Social Connections: Unknown (05/06/2024)  Stress: No Stress Concern Present (09/04/2023)   Received from [facility]  Recent Concern: Stress - Stress Concern Present (07/02/2023)   Received from [facility]  Tobacco Use: Low Risk  (05/06/2024)   SDOH Interventions:     Readmission Risk Interventions     No data to display
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
## 2024-05-12 NOTE — Progress Notes (Signed)
 Progress Note   Patient: Carrie Larsen LKG:[id number] DOB: [birth year] DOA: 05/06/2024     4 DOS: the patient was seen and examined on 05/12/2024   Brief hospital course:  HPI on admission: Ashritha Desrosiers is a 78 y.o. female with medical history significant of CAD status post stenting, recurrent UTI, history of DVT on Eliquis , chronic HFpEF, sent from nursing home for evaluation of worsening of UTI symptoms.   Patient had a UTI for sinus 3 weeks ago, when she developed dysuria and was treated with Macrobid .  Last Thursday/Friday, patient started to have burning sensations and suprapubic abdominal pain again, and she was started on Macrobid  again and 3 days later, urine culture came back and patient was switched to intramuscular meropenem .  So far she received 2 injections but she continued to feel severe burning sensations and cramping-like bladder pain.  She also feels nausea but no vomiting.  Denies any fever chills no back pains.  She feels so weak and malaise last 2 days.   ED Course: Afebrile, nontachycardic blood pressure 104/61 O2 saturation 97% on room air.  UA showed 3+ WBC and 3+ RBCs.  Patient was started on meropenem .  Blood work showed WBC 9.7 K3.3 BUN 35 creatinine 1.2 glucose 114.  Patient was admitted and started on IV antibiotics for UTI. Further hospital course and management as outlined below.  Hospital course complicated by severe abdominal pain and patient was found to have stercoral colitis and severe constipation throughout the colon on CT imaging.  After enema and attempt at manual disimpaction on 6/13, pt developed seizure-like activity as outlined below.      Assessment and Plan:  Complicated UTI with hematuria, failed outpatient management SNF urine culture grew Proteus mirabilis sensitive to carbapenems, cefepime, unasyn, resistant to quionolones --Completed meropenem  --Repeat urine culture here is no growth  Stercoral colitis/proctitis Severe Constipation /  Fecal Impaction Overflow Diarrhea - POA. Pt notes ongoing for about the past 4 months, with associated abdominal cramping pain prior to BM's.  Severe rectal/anal pain she'd been reporting at LTC for weeks. Negative C diff & GI panel Manual disimpaction on 6/13, 6/14 Improving slowly with aggressive bowel regimen 6/17 -- repeat abdominal xray still shows significant stool burden in colon on my review (read in pending) --Avoid anti-diarrheals --Aggressive bowel regimen per orders & adjust as needed --May need to add suppositories or enemas daily --GI consulted, appreciate recommendations - signed off --GI recommends D/c on Miralax  17 g BID --Follow up with GI or PCP --Minimize opioids  Left lower lobe Pneumonia  --Completed antibiotics: meropenem  and ZIthromax    Acute ambulatory impairment / generalized weakness Secondary to UTI, mgmt of UTI as above --PT evaluation --SNF recommended.  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  Seen for code stroke by Neurology.  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 EEG normal.  CT head non-acute --On Lamictal  for chronic pain vs mood disorders(?) - continue at current dose --No indication for seizure precautions   Hypokalemia K was replaced on admission --Monitor BMP, replace K as needed  Hx of CAD - stable, no chest pain, no ischemic EKG changes --Continue Eliquis , Coreg  and Imdur    History of gout - stable, no flare symptoms --Continue allopurinol    History of DVT --Continue Eliquis         Subjective: Pt awake resting in bed this AM.  She reports ongoing diarrhea episodes and a lot of gas.  Otherwise reports feeling much better. Denies other complaints.    Physical Exam: Vitals:   05/11/24 1602 05/11/24 2041 05/12/24 0519 05/12/24 0735  BP: 122/65 125/60 120/65 124/74   Pulse: 83 90 79 73  Resp: 17 20 18 18   Temp: 98.9 F (37.2 C) 98 F (36.7 C) 98.1 F (36.7 C) 98.2 F (36.8 C)  TempSrc:      SpO2: 99% 100% 97% 100%  Weight:      Height:       General exam: awake, alert, no acute distress HEENT: moist mucus membranes, hearing grossly normal  Respiratory system: on room air, normal respiratory effort. CTAB Cardiovascular system: RRR, no pedal edema.   Gastrointestinal system: soft, mildly tender lower abdomen, no rebound or guarding, +bowel sounds Central nervous system: A&O x3. no gross focal neurologic deficits, normal speech Extremities: moves all, no edema, normal tone Skin: dry, intact, normal temperature Psychiatry: normal mood, congruent affect, judgement and insight intact   Data Reviewed:  Notable labs --   Glucose 106 Cr 1.46 >> 1.13 >> 1.06 >> 1.04 >> 1.06  Hbg 11.6 >> 9.9 (suspect dilution with IV fluids) >> 9.6  No hypoglycemic episodes. CBG's ranging 96--132   6/12 -  C diff - negative GI panel - negative   Family Communication: None present, will attempt to call as time allows  Disposition: Status is: Inpatient Remains inpatient appropriate because: continuing aggressive bowel regimen for persistent fecal impaction/constipation, needs at least few more days.      Planned Discharge Destination: Return to LTC    Time spent: 35 minutes  Author: Montey Apa, DO 05/12/2024 2:18 PM  For on call review [email or web address].

## 2024-05-12 NOTE — Care Management Important Message (Signed)
 Important Message  Patient Details  Name: Carrie Larsen MRN: [id number] Date of Birth: 1945-12-13   Important Message Given:  Yes - Medicare IM     Meganne Rita W, CMA 05/12/2024, 12:21 PM

## 2024-05-12 NOTE — Plan of Care (Signed)
   Problem: Health Behavior/Discharge Planning: Goal: Ability to manage health-related needs will improve Outcome: Progressing   Problem: Clinical Measurements: Goal: Will remain free from infection Outcome: Progressing Goal: Diagnostic test results will improve Outcome: Progressing

## 2024-05-12 NOTE — Plan of Care (Signed)
  Problem: Education: Goal: Knowledge of General Education information will improve Description Including pain rating scale, medication(s)/side effects and non-pharmacologic comfort measures Outcome: Progressing   Problem: Clinical Measurements: Goal: Will remain free from infection Outcome: Progressing   Problem: Nutrition: Goal: Adequate nutrition will be maintained Outcome: Progressing   Problem: Elimination: Goal: Will not experience complications related to bowel motility Outcome: Progressing Goal: Will not experience complications related to urinary retention Outcome: Progressing   Problem: Safety: Goal: Ability to remain free from injury will improve Outcome: Progressing   Problem: Skin Integrity: Goal: Risk for impaired skin integrity will decrease Outcome: Progressing

## 2024-05-13 DIAGNOSIS — K592 Neurogenic bowel, not elsewhere classified: Secondary | ICD-10-CM | POA: Diagnosis not present

## 2024-05-13 DIAGNOSIS — K5900 Constipation, unspecified: Secondary | ICD-10-CM | POA: Diagnosis not present

## 2024-05-13 LAB — GLUCOSE, CAPILLARY
Glucose-Capillary: 86 mg/dL (ref 70–99)
Glucose-Capillary: 146 mg/dL — ABNORMAL HIGH (ref 70–99)
Glucose-Capillary: 132 mg/dL — ABNORMAL HIGH (ref 70–99)
Glucose-Capillary: 117 mg/dL — ABNORMAL HIGH (ref 70–99)
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
## 2024-05-13 NOTE — Plan of Care (Signed)
  Problem: Nutrition: Goal: Adequate nutrition will be maintained Outcome: Progressing   Problem: Coping: Goal: Level of anxiety will decrease Outcome: Progressing   Problem: Pain Managment: Goal: General experience of comfort will improve and/or be controlled Outcome: Progressing   Problem: Safety: Goal: Ability to remain free from injury will improve Outcome: Progressing

## 2024-05-13 NOTE — Progress Notes (Signed)
 Progress Note   Patient: Carrie Larsen VHQ:[id number] DOB: 07-29-1946 DOA: 05/06/2024     5 DOS: the patient was seen and examined on 05/13/2024     Brief hospital course:   HPI on admission: Catherina Pates is a 78 y.o. female with medical history significant of CAD status post stenting, recurrent UTI, history of DVT on Eliquis , chronic HFpEF, sent from nursing home for evaluation of worsening of UTI symptoms.   Patient had a UTI for sinus 3 weeks ago, when she developed dysuria and was treated with Macrobid .  Last Thursday/Friday, patient started to have burning sensations and suprapubic abdominal pain again, and she was started on Macrobid  again and 3 days later, urine culture came back and patient was switched to intramuscular meropenem .  So far she received 2 injections but she continued to feel severe burning sensations and cramping-like bladder pain.  She also feels nausea but no vomiting.  Denies any fever chills no back pains.  She feels so weak and malaise last 2 days.   ED Course: Afebrile, nontachycardic blood pressure 104/61 O2 saturation 97% on room air.  UA showed 3+ WBC and 3+ RBCs.  Patient was started on meropenem .  Blood work showed WBC 9.7 K3.3 BUN 35 creatinine 1.2 glucose 114.   Patient was admitted and started on IV antibiotics for UTI. Further hospital course and management as outlined below.   Hospital course complicated by severe abdominal pain and patient was found to have stercoral colitis and severe constipation throughout the colon on CT imaging.  After enema and attempt at manual disimpaction on 6/13, pt developed seizure-like activity as outlined below.         Assessment and Plan:   Complicated UTI with hematuria, failed outpatient management SNF urine culture grew Proteus mirabilis sensitive to carbapenems, cefepime, unasyn, resistant to quionolones --Completed meropenem  --Repeat urine culture here is no growth   Stercoral colitis/proctitis Severe  Constipation / Fecal Impaction Overflow Diarrhea - POA. Pt notes ongoing for about the past 4 months, with associated abdominal cramping pain prior to BM's.  Severe rectal/anal pain she'd been reporting at LTC for weeks. Negative C diff & GI panel Manual disimpaction on 6/13, 6/14 Improving slowly with aggressive bowel regimen 6/17 -- repeat abdominal xray still shows significant stool burden in colon on my review (read in pending) -Avoid antidiarrhea medication as possible --Aggressive bowel regimen per orders & adjust as needed --May need to add suppositories or enemas daily --GI consulted, appreciate recommendations - signed off --GI recommends D/c on Miralax  17 g BID --Follow up with GI or PCP --Minimize opioids   Left lower lobe Pneumonia  --Completed antibiotics: meropenem  and ZIthromax    Acute ambulatory impairment / generalized weakness Secondary to UTI, mgmt of UTI as above --PT evaluation --SNF recommended.  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  Seen for code stroke by Neurology.  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 EEG normal.  CT head non-acute --On Lamictal  for chronic pain vs mood disorders(?) - continue at current dose --No indication for seizure precautions   Hypokalemia Continue monitoring and repletion   Hx of CAD - stable, no chest pain, no ischemic EKG changes --Continue Eliquis , Coreg  and Imdur    History of gout - stable, no flare symptoms --Continue allopurinol    History of DVT Continue Eliquis   Subjective:  Patient denies abdominal  pain nausea vomiting Still has some constipation on imaging We will continue with stool softeners   Physical Exam:  General exam: awake, alert, no acute distress HEENT: moist mucus membranes, hearing grossly normal  Respiratory system: on room air,  normal respiratory effort. CTAB Cardiovascular system: RRR, no pedal edema.   Gastrointestinal system: soft, mildly tender lower abdomen, no rebound or guarding, +bowel sounds Central nervous system: A&O x3. no gross focal neurologic deficits, normal speech Extremities: moves all, no edema, normal tone Skin: dry, intact, normal temperature Psychiatry: normal mood, congruent affect, judgement and insight intact        Family Communication: None present, will attempt to call as time allows   Disposition: Status is: Inpatient Remains inpatient appropriate because: continuing aggressive bowel regimen for persistent fecal impaction/constipation, needs at least few more days.        Planned Discharge Destination: Return to LTC       Time spent: 39 minutes  Data Reviewed:     Vitals:   05/12/24 2342 05/13/24 0301 05/13/24 0745 05/13/24 1551  BP: (!) 125/57 128/66 135/65 (!) 115/53  Pulse: 77 77 77 78  Resp: 18 16 16 17   Temp: 98.4 F (36.9 C) 98.5 F (36.9 C) 98.8 F (37.1 C) 98 F (36.7 C)  TempSrc:   Oral Oral  SpO2: 99% 98% 96% 100%  Weight:      Height:          Latest Ref Rng & Units 05/12/2024    4:13 AM 05/10/2024    4:36 AM 05/09/2024    2:25 AM  CBC  WBC 4.0 - 10.5 K/uL 6.5  9.8  14.0   Hemoglobin 12.0 - 15.0 g/dL 9.6  9.9  02.5   Hematocrit 36.0 - 46.0 % 30.5  30.5  36.7   Platelets 150 - 400 K/uL 322  287  329        Latest Ref Rng & Units 05/12/2024    4:13 AM 05/11/2024    1:55 AM 05/10/2024    4:36 AM  BMP  Glucose 70 - 99 mg/dL 852  778  98   BUN 8 - 23 mg/dL 23  23  28    Creatinine 0.44 - 1.00 mg/dL 2.42  3.53  6.14   Sodium 135 - 145 mmol/L 140  141  138   Potassium 3.5 - 5.1 mmol/L 3.7  3.7  3.5   Chloride 98 - 111 mmol/L 109  111  110   CO2 22 - 32 mmol/L 25  23  22    Calcium  8.9 - 10.3 mg/dL 9.4  9.6  9.5      Author: Ezzard Holms, MD 05/13/2024 6:39 PM  For on call review [email or web address].

## 2024-05-13 NOTE — TOC Progression Note (Signed)
 Transition of Care [facility]) - Progression Note    Patient Details  Name: Carrie Larsen MRN: [id number] Date of Birth: [birth year]  Transition of Care [facility]) CM/SW Contact  Alexandra Ice, RN Phone Number: 05/13/2024, 8:39 AM  Clinical Narrative:     Benjaman Branch pending MD signature       Expected Discharge Plan and Services                                               Social Determinants of Health (SDOH) Interventions SDOH Screenings   Food Insecurity: No Food Insecurity (05/06/2024)  Housing: Low Risk  (05/06/2024)  Transportation Needs: No Transportation Needs (05/06/2024)  Utilities: Not At Risk (05/06/2024)  Alcohol Screen: Low Risk  (02/27/2023)  Depression (PHQ2-9): Low Risk  (02/27/2023)  Financial Resource Strain: Patient Unable To Answer (07/04/2023)   Received from [facility]  Social Connections: Unknown (05/06/2024)  Stress: No Stress Concern Present (09/04/2023)   Received from [facility]  Recent Concern: Stress - Stress Concern Present (07/02/2023)   Received from [facility]  Tobacco Use: Low Risk  (05/06/2024)    Readmission Risk Interventions     No data to display

## 2024-05-13 NOTE — NC FL2 (Signed)
 [place]  MEDICAID FL2 LEVEL OF CARE FORM     IDENTIFICATION  Patient Name: Carrie Larsen Birthdate: 07-12-1946 Sex: female Admission Date (Current Location): 05/06/2024  [facility] and [place] Number:  Chiropodist and Address:  [facility], [address] [place], [place] [address]      Provider Number: [id number]  Attending Physician Name and Address:  Ezzard Holms, MD  Relative Name and Phone Number:  FriendCharliene Conte, [phone]    Current Level of Care: Hospital Recommended Level of Care: Nursing Facility Prior Approval Number:    Date Approved/Denied:   PASRR Number: [id number]  Discharge Plan: SNF    Current Diagnoses: Patient Active Problem List   Diagnosis Date Noted   Fecal impaction (HCC) 05/09/2024   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   Constipation 05/08/2024   CAP (community acquired pneumonia) 05/08/2024   H/O left nephrectomy 05/08/2024   Seizures (HCC) 05/08/2024   Altered mental status 05/08/2024   Overflow diarrhea 05/07/2024   Ambulatory dysfunction 05/07/2024   Hypokalemia 05/07/2024   Urinary tract infection 05/06/2024   UTI (urinary tract infection) 05/06/2024   Pain in limb 03/26/2023   Degenerative spondylolisthesis 10/11/2022   History of DVT (deep vein thrombosis) 09/25/2022   Elevated blood sugar 08/09/2022   Gout involving toe 08/09/2022   Coronary artery disease of native artery of native heart with stable angina pectoris (HCC) 07/26/2022   Class 2 severe obesity with serious comorbidity and body mass index (BMI) of 38.0 to 38.9 in adult (HCC) 07/26/2022   Pain in left lower leg 07/26/2022   Lower limb pain, inferior, left 05/30/2022   Open wound of left lower leg due to cat bite 04/24/2022   Injury of left lower leg 04/01/2022   Osteoarthritis of left knee 10/05/2021   Pain in joint of left knee 10/05/2021   Personal history of kidney stones 12/25/2019   Choroidal nevus, right eye  11/20/2018   CME (cystoid macular edema), left 11/20/2018   Nonexudative age-related macular degeneration, bilateral, early dry stage 11/20/2018   Pseudophakia of both eyes 11/20/2018   Olecranon bursitis 10/16/2018   Contusion of knee 10/16/2018   Arthritis of knee 10/16/2018   Arthritis of hand 10/16/2018   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/10/2018   Obesity 09/11/2017   Abdominal pain 02/28/2017   History of renal cell carcinoma 02/20/2017   Knee pain 08/14/2016   Strain of hamstring muscle 08/08/2016   Left leg swelling 08/22/2015   Systolic murmur    Retrosternal chest pain 07/08/2015   History of colonic polyps 04/16/2015   Chest pain 03/08/2011   Hyperlipidemia 03/08/2011   HTN (hypertension) 03/08/2011    Orientation RESPIRATION BLADDER Height & Weight     Self, Time, Situation, Place  Normal Incontinent Weight: 96.3 kg Height:  5' 10 (177.8 cm)  BEHAVIORAL SYMPTOMS/MOOD NEUROLOGICAL BOWEL NUTRITION STATUS      Incontinent Diet (Regular diet, thin liquid)  AMBULATORY STATUS COMMUNICATION OF NEEDS Skin   Total Care Verbally Normal                       Personal Care Assistance Level of Assistance  Bathing, Feeding, Dressing Bathing Assistance: Maximum assistance Feeding assistance: Independent Dressing Assistance: Maximum assistance     Functional Limitations Info             SPECIAL CARE FACTORS FREQUENCY  PT (By licensed PT), OT (By licensed OT)     PT Frequency: 1 time  per week OT Frequency: 2 times per week            Contractures Contractures Info: Not present    Additional Factors Info  Code Status, Allergies Code Status Info: DNR - Limited Allergies Info: Morphine , Codeine, Prednisone, Amlodipine , Latex           Current Medications (05/13/2024):  This is the current hospital active medication list Current Facility-Administered Medications  Medication Dose Route Frequency Provider Last Rate Last Admin    acetaminophen  (TYLENOL ) tablet 650 mg  650 mg Oral Q6H PRN Antoniette Batty T, MD   650 mg at 05/08/24 9323   Or   acetaminophen  (TYLENOL ) suppository 650 mg  650 mg Rectal Q6H PRN [place], MD       ALPRAZolam [place]) tablet 0.25 mg  0.25 mg Oral BID PRN Darus Engels A, DO   0.25 mg at 05/12/24 1000   apixaban  (ELIQUIS ) tablet 2.5 mg  2.5 mg Oral BID Antoniette Batty T, MD   2.5 mg at 05/12/24 2210   atorvastatin  (LIPITOR ) tablet 80 mg  80 mg Oral Daily Antoniette Batty T, MD   80 mg at 05/12/24 0948   bisacodyl (DULCOLAX) EC tablet 5 mg  5 mg Oral Daily Darus Engels A, DO   5 mg at 05/12/24 5573   HYDROcodone -acetaminophen  (NORCO/VICODIN) 5-325 MG per tablet 1 tablet  1 tablet Oral Q6H PRN Darus Engels A, DO   1 tablet at 05/10/24 1039   insulin  aspart (novoLOG ) injection 0-15 Units  0-15 Units Subcutaneous TID WC [place], MD   2 Units at 05/10/24 1820   lamoTRIgine  (LAMICTAL ) tablet 150 mg  150 mg Oral Daily Antoniette Batty T, MD   150 mg at 05/12/24 2202   magnesium  hydroxide (MILK OF MAGNESIA) suspension 15 mL  15 mL Oral Daily Darus Engels A, DO   15 mL at 05/12/24 5427   ondansetron  (ZOFRAN ) tablet 4 mg  4 mg Oral Q6H PRN Antoniette Batty T, MD       Or   ondansetron  (ZOFRAN ) injection 4 mg  4 mg Intravenous Q6H PRN Antoniette Batty T, MD       pantoprazole  (PROTONIX ) EC tablet 40 mg  40 mg Oral Daily Antoniette Batty T, MD   40 mg at 05/12/24 0948   polyethylene glycol (MIRALAX  / GLYCOLAX ) packet 17 g  17 g Oral BID Darus Engels A, DO   17 g at 05/12/24 2210   senna-docusate (Senokot-S) tablet 2 tablet  2 tablet Oral Daily [place], MD   2 tablet at 05/12/24 0948   simethicone  (MYLICON) chewable tablet 80 mg  80 mg Oral Daily Zhang, Ping T, MD   80 mg at 05/12/24 0623     Discharge Medications: Please see discharge summary for a list of discharge medications.  Relevant Imaging Results:  Relevant Lab Results:   Additional Information SSN: [id number]  Alexandra Ice,  RN

## 2024-05-13 NOTE — TOC Initial Note (Signed)
 Transition of Care [facility]) - Initial/Assessment Note    Patient Details  Name: Carrie Larsen MRN: [id number] Date of Birth: 1946-01-28  Transition of Care [facility]) CM/SW Contact:    Alexandra Ice, RN Phone Number: 05/13/2024, 10:03 AM  Clinical Narrative:                  Patient is resident at [facility]. PASRR, FL2 and referral sent to [facility] via HUB.  Expected Discharge Plan: Long Term Nursing Home Barriers to Discharge: Continued Medical Work up   Patient Goals and CMS Choice Patient states their goals for this hospitalization and ongoing recovery are:: get better CMS Medicare.gov Compare Post Acute Care list provided to:: Patient Choice offered to / list presented to : Patient      Expected Discharge Plan and Services   Discharge Planning Services: CM Consult Post Acute Care Choice: Nursing Home Living arrangements for the past 2 months: Skilled Nursing Facility                   DME Agency: NA       HH Arranged: NA          Prior Living Arrangements/Services Living arrangements for the past 2 months: Skilled Nursing Facility Lives with:: Facility Resident Patient language and need for interpreter reviewed:: Yes Do you feel safe going back to the place where you live?: Yes      Need for Family Participation in Patient Care: No (Comment) Care giver support system in place?: Yes (comment)   Criminal Activity/Legal Involvement Pertinent to Current Situation/Hospitalization: No - Comment as needed  Activities of Daily Living   ADL Screening (condition at time of admission) Independently performs ADLs?: No Does the patient have a NEW difficulty with bathing/dressing/toileting/self-feeding that is expected to last >3 days?: No Does the patient have a NEW difficulty with getting in/out of bed, walking, or climbing stairs that is expected to last >3 days?: No Does the patient have a NEW difficulty with communication that is expected to last >3 days?: No Is the  patient deaf or have difficulty hearing?: No Does the patient have difficulty seeing, even when wearing glasses/contacts?: No Does the patient have difficulty concentrating, remembering, or making decisions?: No  Permission Sought/Granted                  Emotional Assessment Appearance:: Appears stated age Attitude/Demeanor/Rapport: Engaged Affect (typically observed): Appropriate Orientation: : Oriented to Self, Oriented to Place, Oriented to  Time, Oriented to Situation Alcohol / Substance Use: Not Applicable Psych Involvement: No (comment)  Admission diagnosis:  Hypokalemia [E87.6] Lower urinary tract infectious disease [N39.0] UTI (urinary tract infection) [N39.0] Urinary tract infection [N39.0] Generalized abdominal pain [R10.84] Flank pain [R10.9] Patient Active Problem List   Diagnosis Date Noted   Fecal impaction (HCC) 05/09/2024   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   Constipation 05/08/2024   CAP (community acquired pneumonia) 05/08/2024   H/O left nephrectomy 05/08/2024   Seizures (HCC) 05/08/2024   Altered mental status 05/08/2024   Overflow diarrhea 05/07/2024   Ambulatory dysfunction 05/07/2024   Hypokalemia 05/07/2024   Urinary tract infection 05/06/2024   UTI (urinary tract infection) 05/06/2024   Pain in limb 03/26/2023   Degenerative spondylolisthesis 10/11/2022   History of DVT (deep vein thrombosis) 09/25/2022   Elevated blood sugar 08/09/2022   Gout involving toe 08/09/2022   Coronary artery disease of native artery of native heart with stable angina pectoris (HCC) 07/26/2022   Class 2 severe obesity with  serious comorbidity and body mass index (BMI) of 38.0 to 38.9 in adult [facility]) 07/26/2022   Pain in left lower leg 07/26/2022   Lower limb pain, inferior, left 05/30/2022   Open wound of left lower leg due to cat bite 04/24/2022   Injury of left lower leg 04/01/2022   Osteoarthritis of left knee 10/05/2021   Pain in joint of left knee 10/05/2021    Personal history of kidney stones 12/25/2019   Choroidal nevus, 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   CME (cystoid macular edema), left 11/20/2018   Nonexudative age-related macular degeneration, bilateral, early dry stage 11/20/2018   Pseudophakia of both eyes 11/20/2018   Olecranon bursitis 10/16/2018   Contusion of knee 10/16/2018   Arthritis of knee 10/16/2018   Arthritis of hand 10/16/2018   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/10/2018   Obesity 09/11/2017   Abdominal pain 02/28/2017   History of renal cell carcinoma 02/20/2017   Knee pain 08/14/2016   Strain of hamstring muscle 08/08/2016   Left leg swelling 08/22/2015   Systolic murmur    Retrosternal chest pain 07/08/2015   History of colonic polyps 04/16/2015   Chest pain 03/08/2011   Hyperlipidemia 03/08/2011   HTN (hypertension) 03/08/2011   PCP:  Velna Ghee, MD Pharmacy:   [facility] [address]), [place] - [address] [place]) [place] [address] Phone: [phone] Fax: [phone]  [facility] - [place], [place] - [address] [place] [address] Phone: [phone] Fax: [phone]  [facility] - [place], [place] - [address] [place] [address] Phone: [phone] Fax: [phone]  [facility]) [phone] - [place], [place] - [address] AT [facility] [address] [place] [address] Phone: [phone] Fax: [phone]     Social Drivers of Health (SDOH) Social History: SDOH Screenings   Food Insecurity: No Food Insecurity (05/06/2024)  Housing: Low Risk  (05/06/2024)  Transportation Needs: No Transportation Needs (05/06/2024)  Utilities: Not At Risk (05/06/2024)  Alcohol Screen: Low Risk  (02/27/2023)  Depression (PHQ2-9): Low Risk  (02/27/2023)  Financial Resource Strain: Patient Unable To Answer (07/04/2023)   Received from [facility]  Social  Connections: Unknown (05/06/2024)  Stress: No Stress Concern Present (09/04/2023)   Received from [facility]  Recent Concern: Stress - Stress Concern Present (07/02/2023)   Received from [facility]  Tobacco Use: Low Risk  (05/06/2024)   SDOH Interventions:     Readmission Risk Interventions     No data to display

## 2024-05-14 ENCOUNTER — Inpatient Hospital Stay

## 2024-05-14 DIAGNOSIS — K5900 Constipation, unspecified: Secondary | ICD-10-CM | POA: Diagnosis not present

## 2024-05-14 DIAGNOSIS — K592 Neurogenic bowel, not elsewhere classified: Secondary | ICD-10-CM | POA: Diagnosis not present

## 2024-05-14 LAB — BASIC METABOLIC PANEL WITH GFR
Anion gap: 7 (ref 5–15)
BUN: 20 mg/dL (ref 8–23)
CO2: 23 mmol/L (ref 22–32)
Calcium: 9.5 mg/dL (ref 8.9–10.3)
Chloride: 109 mmol/L (ref 98–111)
Creatinine, Ser: 0.91 mg/dL (ref 0.44–1.00)
GFR, Estimated: >60 mL/min (ref >60)
Glucose, Bld: 90 mg/dL (ref 70–99)
Potassium: 4.3 mmol/L (ref 3.5–5.1)
Sodium: 139 mmol/L (ref 135–145)

## 2024-05-14 LAB — CBC WITH DIFFERENTIAL/PLATELET
Abs Immature Granulocytes: 0.14 10*3/uL — ABNORMAL HIGH (ref 0.00–0.07)
Basophils Absolute: 0.1 10*3/uL (ref 0.0–0.1)
Basophils Relative: 1 %
Eosinophils Absolute: 0.3 10*3/uL (ref 0.0–0.5)
Eosinophils Relative: 3 %
HCT: 31.8 % — ABNORMAL LOW (ref 36.0–46.0)
Hemoglobin: 9.8 g/dL — ABNORMAL LOW (ref 12.0–15.0)
Immature Granulocytes: 1 %
Lymphocytes Relative: 20 %
Lymphs Abs: 2.3 10*3/uL (ref 0.7–4.0)
MCH: 28.6 pg (ref 26.0–34.0)
MCHC: 30.8 g/dL (ref 30.0–36.0)
MCV: 92.7 fL (ref 80.0–100.0)
Monocytes Absolute: 1.1 10*3/uL — ABNORMAL HIGH (ref 0.1–1.0)
Monocytes Relative: 9 %
Neutro Abs: 7.5 10*3/uL (ref 1.7–7.7)
Neutrophils Relative %: 66 %
Platelets: 357 10*3/uL (ref 150–400)
RBC: 3.43 MIL/uL — ABNORMAL LOW (ref 3.87–5.11)
RDW: 13.4 % (ref 11.5–15.5)
WBC: 11.3 10*3/uL — ABNORMAL HIGH (ref 4.0–10.5)
nRBC: 0 % (ref 0.0–0.2)

## 2024-05-14 LAB — GLUCOSE, CAPILLARY
Glucose-Capillary: 85 mg/dL (ref 70–99)
Glucose-Capillary: 116 mg/dL — ABNORMAL HIGH (ref 70–99)
Glucose-Capillary: 128 mg/dL — ABNORMAL HIGH (ref 70–99)
Glucose-Capillary: 116 mg/dL — ABNORMAL HIGH (ref 70–99)

## 2024-05-14 NOTE — Progress Notes (Signed)
 Progress Note   Patient: Carrie Larsen UEA:[id number] DOB: 1946-01-13 DOA: 05/06/2024     6 DOS: the patient was seen and examined on 05/14/2024     Brief hospital course:   HPI on admission: Carrie Larsen is a 78 y.o. female with medical history significant of CAD status post stenting, recurrent UTI, history of DVT on Eliquis , chronic HFpEF, sent from nursing home for evaluation of worsening of UTI symptoms.   Patient had a UTI for sinus 3 weeks ago, when she developed dysuria and was treated with Macrobid .  Last Thursday/Friday, patient started to have burning sensations and suprapubic abdominal pain again, and she was started on Macrobid  again and 3 days later, urine culture came back and patient was switched to intramuscular meropenem .  So far she received 2 injections but she continued to feel severe burning sensations and cramping-like bladder pain.  She also feels nausea but no vomiting.  Denies any fever chills no back pains.  She feels so weak and malaise last 2 days.   ED Course: Afebrile, nontachycardic blood pressure 104/61 O2 saturation 97% on room air.  UA showed 3+ WBC and 3+ RBCs.  Patient was started on meropenem .  Blood work showed WBC 9.7 K3.3 BUN 35 creatinine 1.2 glucose 114.   Patient was admitted and started on IV antibiotics for UTI. Further hospital course and management as outlined below.   Hospital course complicated by severe abdominal pain and patient was found to have stercoral colitis and severe constipation throughout the colon on CT imaging.  After enema and attempt at manual disimpaction on 6/13, pt developed seizure-like activity as outlined below.         Assessment and Plan:   Complicated UTI with hematuria, failed outpatient management SNF urine culture grew Proteus mirabilis sensitive to carbapenems, cefepime, unasyn, resistant to quionolones --Completed meropenem  --Repeat urine culture here is no growth   Stercoral colitis/proctitis Severe  Constipation / Fecal Impaction Overflow Diarrhea - POA. Pt notes ongoing for about the past 4 months, with associated abdominal cramping pain prior to BM's.  Severe rectal/anal pain she'd been reporting at LTC for weeks. Negative C diff & GI panel Manual disimpaction on 6/13, 6/14 Improving slowly with aggressive bowel regimen 6/17 -- repeat abdominal xray still shows significant stool burden in colon on my review (read in pending) -Avoid antidiarrhea medication as possible --Aggressive bowel regimen per orders & adjust as needed --May need to add suppositories or enemas daily --GI consulted, appreciate recommendations - signed off --GI recommends D/c on Miralax  17 g BID --Follow up with GI or PCP --Minimize opioids   Left lower lobe Pneumonia  --Completed antibiotics: meropenem  and ZIthromax    Acute ambulatory impairment / generalized weakness Secondary to UTI, mgmt of UTI as above --PT evaluation --SNF recommended.  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  Seen for code stroke by Neurology.  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 EEG normal.  CT head non-acute --On Lamictal  for chronic pain vs mood disorders(?) - continue at current dose --No indication for seizure precautions   Hypokalemia Continue monitoring and repletion   Hx of CAD - stable, no chest pain, no ischemic EKG changes --Continue Eliquis , Coreg  and Imdur    History of gout - stable, no flare symptoms --Continue allopurinol    History of DVT Continue Eliquis    Subjective:  Patient denies  abdominal pain nausea vomiting Patient able to pass some stool today Abdominal imaging requested   Physical Exam:   General exam: awake, alert, no acute distress HEENT: moist mucus membranes, hearing grossly normal  Respiratory system: on room air, normal  respiratory effort. CTAB Cardiovascular system: RRR, no pedal edema.   Gastrointestinal system: soft, mildly tender lower abdomen, no rebound or guarding, +bowel sounds Central nervous system: A&O x3. no gross focal neurologic deficits, normal speech Extremities: moves all, no edema, normal tone Skin: dry, intact, normal temperature Psychiatry: normal mood, congruent affect, judgement and insight intact   Family Communication: None present, will attempt to call as time allows   Disposition: Status is: Inpatient Remains inpatient appropriate because: continuing aggressive bowel regimen for persistent fecal impaction/constipation, needs at least few more days.        Planned Discharge Destination: Return to LTC       Time spent: 39 minutes   Data Reviewed:        Latest Ref Rng & Units 05/14/2024    3:10 AM 05/12/2024    4:13 AM 05/10/2024    4:36 AM  CBC  WBC 4.0 - 10.5 K/uL 11.3  6.5  9.8   Hemoglobin 12.0 - 15.0 g/dL 9.8  9.6  9.9   Hematocrit 36.0 - 46.0 % 31.8  30.5  30.5   Platelets 150 - 400 K/uL 357  322  287        Latest Ref Rng & Units 05/14/2024    3:10 AM 05/12/2024    4:13 AM 05/11/2024    1:55 AM  BMP  Glucose 70 - 99 mg/dL 90  865  784   BUN 8 - 23 mg/dL 20  23  23    Creatinine 0.44 - 1.00 mg/dL 6.96  2.95  2.84   Sodium 135 - 145 mmol/L 139  140  141   Potassium 3.5 - 5.1 mmol/L 4.3  3.7  3.7   Chloride 98 - 111 mmol/L 109  109  111   CO2 22 - 32 mmol/L 23  25  23    Calcium  8.9 - 10.3 mg/dL 9.5  9.4  9.6      Vitals:   05/14/24 0503 05/14/24 0758 05/14/24 1447 05/14/24 1515  BP: 118/60 117/61 (!) 118/59 122/71  Pulse: 77 78 84 84  Resp: 17 17 17 18   Temp: 98 F (36.7 C) 98.3 F (36.8 C) 98.4 F (36.9 C) 97.9 F (36.6 C)  TempSrc: Oral   Oral  SpO2: 100% 98% 97% 99%  Weight:      Height:         Author: Ezzard Holms, MD 05/14/2024 4:23 PM  For on call review [email or web address].

## 2024-05-14 NOTE — Plan of Care (Signed)
   Problem: Education: Goal: Knowledge of General Education information will improve Description Including pain rating scale, medication(s)/side effects and non-pharmacologic comfort measures Outcome: Progressing

## 2024-05-14 NOTE — TOC Progression Note (Signed)
 Transition of Care [facility]) - Progression Note    Patient Details  Name: Carrie Larsen MRN: [id number] Date of Birth: [birth year]  Transition of Care [facility]) CM/SW Contact  Alexandra Ice, RN Phone Number: 05/14/2024, 3:19 PM  Clinical Narrative:     Patient is impacted, pending bowel movement. Patient can return to [facility] when medically ready.  Expected Discharge Plan: Long Term Nursing Home Barriers to Discharge: Continued Medical Work up  Expected Discharge Plan and Services   Discharge Planning Services: CM Consult Post Acute Care Choice: Nursing Home Living arrangements for the past 2 months: Skilled Nursing Facility                   DME Agency: NA       HH Arranged: NA           Social Determinants of Health (SDOH) Interventions SDOH Screenings   Food Insecurity: No Food Insecurity (05/06/2024)  Housing: Low Risk  (05/06/2024)  Transportation Needs: No Transportation Needs (05/06/2024)  Utilities: Not At Risk (05/06/2024)  Alcohol Screen: Low Risk  (02/27/2023)  Depression (PHQ2-9): Low Risk  (02/27/2023)  Financial Resource Strain: Patient Unable To Answer (07/04/2023)   Received from [facility]  Social Connections: Unknown (05/06/2024)  Stress: No Stress Concern Present (09/04/2023)   Received from [facility]  Recent Concern: Stress - Stress Concern Present (07/02/2023)   Received from [facility]  Tobacco Use: Low Risk  (05/06/2024)    Readmission Risk Interventions     No data to display

## 2024-05-15 LAB — CBC WITH DIFFERENTIAL/PLATELET
Abs Immature Granulocytes: 0.14 10*3/uL — ABNORMAL HIGH (ref 0.00–0.07)
Basophils Absolute: 0.1 10*3/uL (ref 0.0–0.1)
Basophils Relative: 1 %
Eosinophils Absolute: 0.3 10*3/uL (ref 0.0–0.5)
Eosinophils Relative: 3 %
HCT: 31 % — ABNORMAL LOW (ref 36.0–46.0)
Hemoglobin: 10 g/dL — ABNORMAL LOW (ref 12.0–15.0)
Immature Granulocytes: 1 %
Lymphocytes Relative: 22 %
Lymphs Abs: 2.5 10*3/uL (ref 0.7–4.0)
MCH: 29.9 pg (ref 26.0–34.0)
MCHC: 32.3 g/dL (ref 30.0–36.0)
MCV: 92.5 fL (ref 80.0–100.0)
Monocytes Absolute: 1.2 10*3/uL — ABNORMAL HIGH (ref 0.1–1.0)
Monocytes Relative: 10 %
Neutro Abs: 7 10*3/uL (ref 1.7–7.7)
Neutrophils Relative %: 63 %
Platelets: 332 10*3/uL (ref 150–400)
RBC: 3.35 MIL/uL — ABNORMAL LOW (ref 3.87–5.11)
RDW: 13.5 % (ref 11.5–15.5)
WBC: 11.2 10*3/uL — ABNORMAL HIGH (ref 4.0–10.5)
nRBC: 0 % (ref 0.0–0.2)

## 2024-05-15 LAB — BASIC METABOLIC PANEL WITH GFR
Anion gap: 5 (ref 5–15)
BUN: 19 mg/dL (ref 8–23)
CO2: 26 mmol/L (ref 22–32)
Calcium: 9.4 mg/dL (ref 8.9–10.3)
Chloride: 109 mmol/L (ref 98–111)
Creatinine, Ser: 0.89 mg/dL (ref 0.44–1.00)
GFR, Estimated: >60 mL/min (ref >60)
Glucose, Bld: 96 mg/dL (ref 70–99)
Potassium: 3.9 mmol/L (ref 3.5–5.1)
Sodium: 140 mmol/L (ref 135–145)

## 2024-05-15 LAB — GLUCOSE, CAPILLARY
Glucose-Capillary: 87 mg/dL (ref 70–99)
Glucose-Capillary: 108 mg/dL — ABNORMAL HIGH (ref 70–99)

## 2024-05-15 MED ORDER — HYDROCODONE-ACETAMINOPHEN 5-325 MG PO TABS: 1.0000 | ORAL_TABLET | Freq: Four times a day (QID) | ORAL | 0 refills | Status: AC

## 2024-05-15 MED ORDER — LAMOTRIGINE 100 MG PO TABS
150.0000 mg | ORAL_TABLET | Freq: Every day | ORAL | Status: DC
  Administered 2024-05-15: 150 mg via ORAL
  Filled 2024-05-15: qty 1.5

## 2024-05-15 MED ORDER — BISACODYL 5 MG PO TBEC: 5.0000 mg | DELAYED_RELEASE_TABLET | Freq: Every day | ORAL | Status: DC

## 2024-05-15 NOTE — Plan of Care (Addendum)
  Problem: Education: Goal: Knowledge of General Education information will improve Description: Including pain rating scale, medication(s)/side effects and non-pharmacologic comfort measures Outcome: Progressing   Problem: Nutrition: Goal: Adequate nutrition will be maintained Outcome: Progressing   Problem: Health Behavior/Discharge Planning: Goal: Ability to manage health-related needs will improve Outcome:  Progressing   Problem: Clinical Measurements: Goal: Ability to maintain clinical measurements within normal limits will improve Outcome: Progressing Goal: Will remain free from infection Outcome: Progressing Goal: Diagnostic test results will improve Outcome:  Progressing Goal: Respiratory complications will improve Outcome: Progressing Goal: Cardiovascular complication will be avoided Outcome: Progressing   Problem: Activity: Goal: Risk for activity intolerance will decrease Outcome: Progressing   Problem: Coping: Goal: Level of anxiety will decrease Outcome: Not Progressing   Problem: Elimination: Goal: Will not experience complications related to bowel motility Outcome:  Progressing Goal: Will not experience complications related to urinary retention Outcome: Progressing   Problem: Pain Managment: Goal: General experience of comfort will improve and/or be controlled Outcome: Progressing   Problem: Safety: Goal: Ability to remain free from injury will improve Outcome: Not Progressing   Problem: Skin Integrity: Goal: Risk for impaired skin integrity will decrease Outcome: Progressing   Problem: Coping: Goal: Ability to adjust to condition or change in health will improve Outcome: Progressing   Problem: Fluid Volume: Goal: Ability to maintain a balanced intake and output will improve Outcome: Progressing   Problem: Health Behavior/Discharge Planning: Goal: Ability to identify and utilize available resources and services will improve Outcome:  progressing Goal: Ability to manage health-related needs will improve Outcome: Progressing   Problem: Skin Integrity: Goal: Risk for impaired skin integrity will decrease Outcome: Progressing   Problem: Tissue Perfusion: Goal: Adequacy of tissue perfusion will improve Outcome: Progressing

## 2024-05-15 NOTE — TOC Transition Note (Signed)
 Transition of Care [facility]) - Discharge Note   Patient Details  Name: Carrie Larsen MRN: [id number] Date of Birth: [birth year]  Transition of Care [facility]) CM/SW Contact:  Alexandra Ice, RN Phone Number: 05/15/2024, 1:55 PM   Clinical Narrative:     Patient to discharge today, returning to [facility]. Patient going to room 15A, nurse to call report to [phone]. TOC spoke to Ed at [facility], patient on schedule for pick up. EMS packet printed to nurse station. Notified MD and bedside nurse.  Final next level of care: Long Term Nursing Home Barriers to Discharge: Barriers Resolved   Patient Goals and CMS Choice Patient states their goals for this hospitalization and ongoing recovery are:: get better CMS Medicare.gov Compare Post Acute Care list provided to:: Patient Choice offered to / list presented to : Patient      Discharge Placement                Patient to be transferred to facility by: [facility]   Patient and family notified of of transfer: 05/15/24  Discharge Plan and Services Additional resources added to the After Visit Summary for     Discharge Planning Services: CM Consult Post Acute Care Choice: Nursing Home            DME Agency: NA       HH Arranged: NA          Social Drivers of Health (SDOH) Interventions SDOH Screenings   Food Insecurity: No Food Insecurity (05/06/2024)  Housing: Low Risk  (05/06/2024)  Transportation Needs: No Transportation Needs (05/06/2024)  Utilities: Not At Risk (05/06/2024)  Alcohol Screen: Low Risk  (02/27/2023)  Depression (PHQ2-9): Low Risk  (02/27/2023)  Financial Resource Strain: Patient Unable To Answer (07/04/2023)   Received from [facility]  Social Connections: Unknown (05/06/2024)  Stress: No Stress Concern Present (09/04/2023)   Received from [facility]  Recent Concern: Stress - Stress Concern Present (07/02/2023)   Received from [facility]  Tobacco Use: Low Risk  (05/06/2024)      Readmission Risk Interventions     No data to display

## 2024-05-15 NOTE — Progress Notes (Signed)
 Patient discharging to [facility], all belongings sent with patient. Report called and given to [facility] at [facility]. IV removed

## 2024-05-15 NOTE — Progress Notes (Addendum)
 Pt alert and oriented; incont x2.

## 2024-05-15 NOTE — Discharge Summary (Signed)
 Physician Discharge Summary   Patient: Carrie Larsen MRN: [id number] DOB: January 08, 1946  Admit date:     05/06/2024  Discharge date: 05/15/24  Discharge Physician: Ezzard Holms   PCP: Velna Ghee, MD   Recommendations at discharge:  {Follow-up with PCP  Discharge Diagnoses:  Complicated UTI with hematuria Stercoral colitis/proctitis Severe Constipation / Fecal Impaction Overflow Diarrhea - POA. Left lower lobe Pneumonia  Acute ambulatory impairment / generalized weakness Seizure-like activity  Hypokalemia Hx of CAD  History of gout  History of DVT    Hospital Course: Carrie Larsen is a 78 y.o. female with medical history significant of CAD status post stenting, recurrent UTI, history of DVT on Eliquis , chronic HFpEF, sent from nursing home for evaluation of worsening of UTI symptoms.   Patient had a UTI for sinus 3 weeks ago, when she developed dysuria and was treated with Macrobid .     ED Course: Afebrile, nontachycardic blood pressure 104/61 O2 saturation 97% on room air.  UA showed 3+ WBC and 3+ RBCs.  Patient was started on meropenem .  Blood work showed WBC 9.7 K3.3 BUN 35 creatinine 1.2 glucose 114.   Patient was admitted and started on IV antibiotics for UTI. Hospital course complicated by severe abdominal pain and patient was found to have stercoral colitis and severe constipation throughout the colon on CT imaging.  Patient underwent several bowel management and currently has been cleared of all stool burden and no longer constipated.  We recommend to avoid any antidiarrhea medications.  Patient has been prescribed some stool softeners and has completed antibiotic therapy and therefore being discharged back to the facility.  Follow-up with primary care physician  consultants: None Procedures performed: None Disposition: Skilled nursing facility Diet recommendation:  Cardiac diet DISCHARGE MEDICATION: Allergies as of 05/15/2024       Reactions   Morphine  And  Codeine Nausea Only   Prednisone Nausea And Vomiting   Amlodipine  Palpitations   Latex Rash        Medication List     STOP taking these medications    furosemide  40 MG tablet Commonly known as: LASIX    loperamide  2 MG capsule Commonly known as: IMODIUM        TAKE these medications    acetaminophen  500 MG tablet Commonly known as: TYLENOL  Take 500 mg by mouth 2 (two) times daily. Give 1 tablet by mouth 2 times a day for back pain   apixaban  2.5 MG Tabs tablet Commonly known as: ELIQUIS  Take 1 tablet (2.5 mg total) by mouth 2 (two) times daily. What changed: additional instructions   atorvastatin  80 MG tablet Commonly known as: LIPITOR  Take 1 tablet by mouth once daily   bisacodyl 5 MG EC tablet Commonly known as: DULCOLAX Take 1 tablet (5 mg total) by mouth daily. Start taking on: May 16, 2024   Cranberry 450 MG Caps Take 2 capsules by mouth daily. Give 2 capsules by mouth one time a day for urinary tract health   diclofenac Sodium 1 % Gel Commonly known as: VOLTAREN Apply 1 Application topically every 6 (six) hours as needed. Apply to lower back topically every 6 hours as needed for back pain   HYDROcodone -acetaminophen  5-325 MG tablet Commonly known as: NORCO/VICODIN Take 1 tablet by mouth 4 (four) times daily for 3 days. Give 1 tablet by mouth four times a day related to CHRONIC PAIN SYNDROME   insulin  lispro 100 UNIT/ML injection Commonly known as: HUMALOG Inject 0-15 Units into the skin 3 (  three) times daily before meals. Injects as per sliding scale subcutaneously; before meals and at bedtime for High Blood sugar   lamoTRIgine  150 MG tablet Commonly known as: LAMICTAL  Take 150 mg by mouth daily. Give 1 tablet by mouth one time a day related to Bipolar disorder unspecified   ondansetron  4 MG tablet Commonly known as: ZOFRAN  Take 4 mg by mouth every 6 (six) hours as needed for nausea or vomiting. Give 1 tablet by mouth every 6 hours as needed for  Nausea and Vomiting   pantoprazole  40 MG tablet Commonly known as: PROTONIX  Take 40 mg by mouth daily. Give 1 tablet by mouth at bedtime for GERD DO NOT CRUSH   polyethylene glycol 17 g packet Commonly known as: MIRALAX  / GLYCOLAX  Take 17 g by mouth daily. Give 1 scoop by mouth one time a day for constipation hold for loose stool   senna-docusate 8.6-50 MG tablet Commonly known as: Senokot-S Take 2 tablets by mouth daily. Give 2 tablets by mouth every 12 hours as needed for constipation   simethicone  80 MG chewable tablet Commonly known as: MYLICON Chew 80 mg by mouth daily. Give 2 tablets by mouth one time a day for gas        Discharge Exam: Filed Weights   05/06/24 0400  Weight: 96.3 kg   General exam: awake, alert, no acute distress HEENT: moist mucus membranes, hearing grossly normal  Respiratory system: on room air, normal respiratory effort. CTAB Cardiovascular system: RRR, no pedal edema.   Gastrointestinal system: soft, mildly tender lower abdomen, no rebound or guarding, +bowel sounds Central nervous system: A&O x3. no gross focal neurologic deficits, normal speech Extremities: moves all, no edema, normal tone Skin: dry, intact, normal temperature Psychiatry: normal mood, congruent affect, judgement and insight intact      Condition at discharge: good  The results of significant diagnostics from this hospitalization (including imaging, microbiology, ancillary and laboratory) are listed below for reference.   Imaging Studies: DG Abd 1 View Result Date: 05/14/2024 EXAM: 1 VIEW XRAY OF THE ABDOMEN SUPINE 05/14/2024 04:52:00 PM COMPARISON: None available. CLINICAL HISTORY: [id number] Constipation due to neurogenic bowel [id number]. Patient states constipation FINDINGS: BOWEL: Normal colonic stool burden. PERITONEUM AND SOFT TISSUES: Surgical clips and sutures in the left mid abdomen. BONES: No acute osseous abnormality. VASCULATURE: Right iliac stent. IMPRESSION: 1. No  acute findings. Normal colonic stool burden. Electronically signed by: Zadie Herter MD 05/14/2024 08:38 PM EDT RP Workstation: [id number]   DG Abd Portable 1V Result Date: 05/12/2024 CLINICAL DATA:  Abdominal pain.  Follow-up fecal impaction EXAM: PORTABLE ABDOMEN - 1 VIEW COMPARISON:  CT 05/08/2024 FINDINGS: Continued large stool burden throughout the colon. There appears to be decreasing stool burden in the rectum. Nonobstructive bowel gas pattern. No free air or organomegaly. IMPRESSION: Decreasing stool burden in the rectum with continued large stool burden throughout the remainder of the colon. Electronically Signed   By: Janeece Mechanic M.D.   On: 05/12/2024 15:21   CT ABDOMEN PELVIS W CONTRAST Result Date: 05/08/2024 CLINICAL DATA:  Abdominal pain.  Stercoral colitis. EXAM: CT ABDOMEN AND PELVIS WITH CONTRAST TECHNIQUE: Multidetector CT imaging of the abdomen and pelvis was performed using the standard protocol following bolus administration of intravenous contrast. RADIATION DOSE REDUCTION: This exam was performed according to the departmental dose-optimization program which includes automated exposure control, adjustment of the mA and/or kV according to patient size and/or use of iterative reconstruction technique. CONTRAST:  75mL OMNIPAQUE IOHEXOL 350 MG/ML SOLN COMPARISON:  05/07/2019 FINDINGS: Lower chest: Coronary and aortic atherosclerosis with mild cardiomegaly. Continued left lower lobe airspace opacity with overlying accentuated space between the seventh and eighth ribs with some deformity and invagination of subcutaneous tissues along the site. Hepatobiliary: Cholelithiasis.  Otherwise unremarkable. Pancreas: Unremarkable Spleen: Unremarkable Adrenals/Urinary Tract: Adrenal glands normal. Left nephrectomy. 3.1 cm right kidney lower pole cyst on image 51 series 2. No further imaging workup of this lesion is indicated. Moderate right hydronephrosis and right hydroureter extending down to the  UVJ without visualized obstructing stone. This hydronephrosis is new. Previously seen right renal calculi are not well seen today. Stomach/Bowel: Left lateral abdominal wall hernia along the transverse abdominis muscle with herniation of a margin of splenic flexure as on image 49 series 2. Prominent stool throughout the colon favors constipation. Stool ball in the rectum with circumferential wall thickening in the rectum suspicious for stercoral colitis, single wall thickness 1.2 cm and previously 0.8 cm. No current pneumatosis or portal venous gas. Mass to Modic staple line at the rectosigmoid junction. No dilated small bowel. Vascular/Lymphatic: Atherosclerosis is present, including aortoiliac atherosclerotic disease. Atheromatous plaque at the origin of the celiac trunk. Right external iliac vein stent. Reproductive: Uterus absent. There is fluid along the vaginal vestibule, significance uncertain. Other: Presacral and perirectal edema mildly increased from prior. Small perirectal lymph nodes. No overt ascites. Musculoskeletal: Left posterolateral thoracic deformity is noted above. Left lateral abdominal wall hernia through the transverse abdominus muscle as noted above. Thoracic spondylosis. Lower lumbar spondylosis and degenerative disc disease causing multilevel impingement particularly at L3-4, L4-5, and L5-S1. IMPRESSION: 1. Prominent stool throughout the colon favors constipation. Stool ball in the rectum with circumferential wall thickening in the rectum suspicious for stercoral colitis, single wall thickness 1.2 cm and previously 0.8 cm. Presacral and perirectal edema mildly increased from prior. Small perirectal lymph nodes. 2. Moderate right hydronephrosis and right hydroureter extending down to the UVJ without visualized obstructing stone. This hydronephrosis is new. Previously seen right renal calculi are not well seen today. A specific cause for the right hydronephrosis and hydroureter is not  observed. 3. Left lateral abdominal wall hernia along the transverse abdominus muscle with herniation of a margin of splenic flexure. 4. Continued left lower lobe airspace opacity with overlying accentuated space between the seventh and eighth ribs with some deformity and invagination of subcutaneous tissues along the site. 5. Cholelithiasis. 6. Lower lumbar impingement. 7.  Aortic Atherosclerosis (ICD10-I70.0). Electronically Signed   By: Freida Jes M.D.   On: 05/08/2024 14:54   CT HEAD WO CONTRAST ( ) Result Date: 05/08/2024 CLINICAL DATA:  Neuro deficit, concern for stroke. Confusion 2 hours ago. EXAM: CT HEAD WITHOUT CONTRAST TECHNIQUE: Contiguous axial images were obtained from the base of the skull through the vertex without intravenous contrast. RADIATION DOSE REDUCTION: This exam was performed according to the departmental dose-optimization program which includes automated exposure control, adjustment of the mA and/or kV according to patient size and/or use of iterative reconstruction technique. COMPARISON:  CT head 05/06/2021. FINDINGS: Brain: No acute intracranial hemorrhage. No CT evidence of acute infarct. Nonspecific hypoattenuation in the periventricular and subcortical white matter favored to reflect chronic microvascular ischemic changes. No edema, mass effect, or midline shift. The basilar cisterns are patent. Ventricles: Prominence of the ventricles suggesting underlying parenchymal volume loss. Vascular: Atherosclerotic calcifications of the carotid siphons and intracranial vertebral arteries. Slightly increased density of the vasculature likely related to recent contrast enhanced CT. Skull: No acute or aggressive finding. Orbits: Bilateral lens replacement.  Sinuses: Mild mucosal thickening in the paranasal sinuses particularly in the right sphenoid and left maxillary sinuses. Other: Trace fluid in the mastoid air cells. IMPRESSION: No CT evidence of acute intracranial abnormality.  Mild chronic microvascular ischemic changes and mild parenchymal volume loss. Electronically Signed   By: Denny Flack M.D.   On: 05/08/2024 14:28   EEG adult Result Date: 05/08/2024 Augustin Leber, MD     05/08/2024 12:41 PM History: 78 year old female with abrupt mental status change, evaluate for seizures EEG Duration: 22-minute Sedation: Ativan 1 mg given prior to EEG Patient State: Awake and drowsy Technique: This EEG was acquired with electrodes placed according to the International 10-20 electrode system (including Fp1, Fp2, F3, F4, C3, C4, P3, P4, O1, O2, T3, T4, T5, T6, A1, A2, Fz, Cz, Pz). The following electrodes were missing or displaced: none. Background: The background consists of intermixed alpha and beta activities. There is a well defined posterior dominant rhythm of 9-10 hz that attenuates with eye opening.  With drowsiness, there is anterior shifting of the posterior dominant rhythm, but well-formed sleep is not recorded.  There was no epileptiform discharge seen. Photic stimulation: Physiologic driving is not performed EEG Abnormalities: None Clinical Interpretation: This normal EEG is recorded in the waking and drowsy state. There was no seizure or seizure predisposition recorded on this study. Please note that lack of epileptiform activity on EEG does not preclude the possibility of epilepsy. Ann Keto, MD [facility] If 7pm- 7am, please page neurology on call as listed in AMION.  CT CHEST WO CONTRAST Result Date: 05/07/2024 CLINICAL DATA:  Abnormal chest x-ray with lung opacities EXAM: CT CHEST WITHOUT CONTRAST TECHNIQUE: Multidetector CT imaging of the chest was performed following the standard protocol without IV contrast. RADIATION DOSE REDUCTION: This exam was performed according to the departmental dose-optimization program which includes automated exposure control, adjustment of the mA and/or kV according to patient size and/or use of iterative  reconstruction technique. COMPARISON:  CT chest June 27, 2017 chest x-ray May 06, 2024 FINDINGS: Cardiovascular: No significant vascular findings. Normal heart size. No pericardial effusion. Coronary artery calcifications Mediastinum/Nodes: No enlarged mediastinal or axillary lymph nodes. Thyroid  gland, trachea, and esophagus demonstrate no significant findings. Lungs/Pleura: Left lower lobe consolidative pneumonia with minimal left pleural reaction no significant effusion. Upper Abdomen: No acute abnormality. Musculoskeletal: No chest wall mass or suspicious bone lesions identified. IMPRESSION: Left lower lobe consolidative pneumonia with minimal left pleural reaction. Electronically Signed   By: Fredrich Jefferson M.D.   On: 05/07/2024 16:48   CT Renal Stone Study Result Date: 05/06/2024 CLINICAL DATA:  Abdominal/flank pain. Stone suspected. Recently diagnosed with UTI. Increased abdominal pain and burning with urination. EXAM: CT ABDOMEN AND PELVIS WITHOUT CONTRAST TECHNIQUE: Multidetector CT imaging of the abdomen and pelvis was performed following the standard protocol without IV contrast. RADIATION DOSE REDUCTION: This exam was performed according to the departmental dose-optimization program which includes automated exposure control, adjustment of the mA and/or kV according to patient size and/or use of iterative reconstruction technique. COMPARISON:  Last PA and lateral chest series 08/13/2022. Comparison is made with the report of an abdomen and pelvis CT with contrast from 02/25/2017, with images unavailable in PACS, and with images and report of CTs of abdomen and pelvis without contrast 08/24/2012 and 01/09/2012. FINDINGS: Lower chest: There is interval increased laxity in the dorsal left chest wall and increased elevation of the left hemidiaphragm, protruding against the left lower lobe. There is increased opacity in the  left lower lobe which could be due to atelectasis, pneumonia aspiration. There are  linear scar-like opacities in the right lung base which is otherwise clear. The coronary arteries are heavily calcified. The heart is slightly enlarged. No pericardial effusion. Small hiatal hernia. Hepatobiliary: Liver is unremarkable without contrast. There are least 2 faintly calcified stones in the proximal gallbladder, largest is 1.3 cm. No wall thickening or bile duct dilatation. Pancreas: No abnormality is seen without contrast. No ductal dilatation. Spleen: No abnormality.  No splenomegaly. 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 There is no adrenal mass. There is a Bosniak 1 cyst of the inferior pole right kidney measuring 3.7 and 10 Hounsfield units, was previously 2.6 cm. No follow-up imaging is recommended. There is mild right perinephric fat stranding which has not changed since the 2013 studies. There are punctate up to 3 mm nonobstructive caliceal stones in the right renal collecting system. There is no ureteral stone or hydroureteronephrosis. The bladder is diffusely thickened with moderate perivesical stranding consistent with cystitis. Stomach/Bowel: There is a left mid abdominal small bowel anastomosis which was not seen previously. This segment is dilated but there is no wall thickening or inflammatory change and this is probably simple surgical widening of the segment. There is a stable colocolic anastomosis in the sigmoid. Moderate fecal stasis. Appendix is not seen in this patient. There is no wall thickening until the rectum which is mildly thickened and moderately distended with dense stool. 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 Vascular/Lymphatic: Patchy aortoiliac atherosclerosis. No AAA. There is a right external iliac vein stent in place which was not seen previously. No other significant vascular findings.  No lymphadenopathy is seen. Reproductive: Status post hysterectomy. No adnexal masses. Other: Multifocal fat  necrosis subcutaneous abdominal wall. No incarcerated hernia. Trace ascites in the posterior pelvis. No free air, free hemorrhage or abscess. Musculoskeletal: Osteopenia and degenerative change of the spine. Slight dextroscoliosis. There is new demonstration of a 9 mm round sclerotic lesion in the anterior superior L3 vertebral body. This was not reported in 2018. Benign and malignant etiologies are both possible. Consider follow-up bone scan. IMPRESSION: 1. Diffuse bladder thickening with moderate perivesical stranding consistent with cystitis. 2. Nonobstructive right nephrolithiasis. No ureteral stone or hydroureteronephrosis. 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 3. Constipation and stercoral proctitis. 4. New left mid abdominal small bowel anastomosis with dilatation of the segment but no wall thickening or inflammatory change, probably simple surgical widening of the segment. 5. Increased laxity in the dorsal left chest wall and increased elevation of the left hemidiaphragm, with increased opacity in the left lower lobe which could be due to atelectasis, pneumonia or aspiration. 6. Aortic and coronary artery atherosclerosis. 7. Cholelithiasis. 8. Trace ascites in the posterior pelvis. 9. 9 mm round sclerotic lesion in the anterior superior L3 vertebral body. This was not reported in 2018. Benign and malignant etiologies are both possible. Consider follow-up bone scan. 10. Small hiatal hernia. 11. Multifocal fat necrosis subcutaneous abdominal wall. Aortic Atherosclerosis (ICD10-I70.0). Electronically Signed   By: Denman Fischer M.D.   On: 05/06/2024 05:50   DG Chest Port 1 View Result Date: 05/06/2024 CLINICAL DATA:  Chest pain EXAM: PORTABLE CHEST 1 VIEW COMPARISON:  08/13/2022 FINDINGS: New focal opacity identified medial right apex. There is new retrocardiac collapse/consolidation at the left base with small left pleural effusion. Cardiopericardial silhouette is at upper limits of normal for size. No acute bony  abnormality. Telemetry leads overlie the chest. IMPRESSION: 1. New  focal opacity medial right apex. CT chest recommended to further evaluate. 2. New retrocardiac collapse/consolidation at the left base with small left pleural effusion. Electronically Signed   By: Donnal Fusi M.D.   On: 05/06/2024 05:27    Microbiology: Results for orders placed or performed during the hospital encounter of 05/06/24  Culture, blood (routine x 2)     Status: None   Collection Time: 05/06/24  4:01 AM   Specimen: BLOOD RIGHT ARM  Result Value Ref Range Status   Specimen Description BLOOD RIGHT ARM  Final   Special Requests   Final    BOTTLES DRAWN AEROBIC AND ANAEROBIC Blood Culture adequate volume   Culture   Final    NO GROWTH 5 DAYS Performed at [facility], [address], [place], [place] [address]    Report Status 05/11/2024 FINAL  Final  Culture, blood (routine x 2)     Status: None   Collection Time: 05/06/24  4:23 AM   Specimen: BLOOD RIGHT ARM  Result Value Ref Range Status   Specimen Description BLOOD RIGHT ARM  Final   Special Requests   Final    BOTTLES DRAWN AEROBIC ONLY Blood Culture results may not be optimal due to an inadequate volume of blood received in culture bottles   Culture   Final    NO GROWTH 5 DAYS Performed at [facility], [address], [place], [place] [address]    Report Status 05/11/2024 FINAL  Final  Urine Culture     Status: None   Collection Time: 05/06/24  4:23 AM   Specimen: Urine, Random  Result Value Ref Range Status   Specimen Description   Final    URINE, RANDOM Performed at [facility], [address], [place], [place] [address]    Special Requests   Final    NONE Reflexed from [id number] Performed at [facility], [address] [place], [place] [address]    Culture   Final    NO GROWTH Performed at [facility], [address], [place], [place] [address]    Report Status 05/07/2024 FINAL  Final   MRSA Next Gen by PCR, Nasal     Status: None   Collection Time: 05/06/24 11:00 AM   Specimen: Nasal Swab  Result Value Ref Range Status   MRSA by PCR Next Gen NOT DETECTED NOT DETECTED Final    Comment: (NOTE) The GeneXpert MRSA Assay (FDA approved for NASAL specimens only), is one component of a comprehensive MRSA colonization surveillance program. It is not intended to diagnose MRSA infection nor to guide or monitor treatment for MRSA infections. Test performance is not FDA approved in patients less than 98 years old. Performed at [facility], [address] [place], [place] [address]   C Difficile Quick Screen w PCR reflex     Status: None   Collection Time: 05/07/24  9:45 AM   Specimen: STOOL  Result Value Ref Range Status   C Diff antigen NEGATIVE NEGATIVE Final   C Diff toxin NEGATIVE NEGATIVE Final   C Diff interpretation No C. difficile detected.  Final    Comment: Performed at [facility], [address] [place], [place] [address]  Gastrointestinal Panel by PCR , Stool     Status: None   Collection Time: 05/07/24  9:45 AM   Specimen: Stool  Result Value Ref Range Status   Campylobacter species NOT DETECTED NOT DETECTED Final   Plesimonas shigelloides NOT  DETECTED NOT DETECTED Final   Salmonella species NOT DETECTED NOT DETECTED Final   Yersinia enterocolitica NOT DETECTED NOT DETECTED Final   Vibrio species NOT DETECTED NOT DETECTED Final   Vibrio cholerae NOT DETECTED NOT DETECTED Final   Enteroaggregative E coli (EAEC) NOT DETECTED NOT DETECTED Final   Enteropathogenic E coli (EPEC) NOT DETECTED NOT DETECTED Final   Enterotoxigenic E coli (ETEC) NOT DETECTED NOT DETECTED Final   Shiga like toxin producing E coli (STEC) NOT DETECTED NOT DETECTED Final   Shigella/Enteroinvasive E coli (EIEC) NOT DETECTED NOT DETECTED Final   Cryptosporidium NOT DETECTED NOT DETECTED Final   Cyclospora cayetanensis NOT DETECTED NOT DETECTED Final   Entamoeba  histolytica NOT DETECTED NOT DETECTED Final   Giardia lamblia NOT DETECTED NOT DETECTED Final   Adenovirus F40/41 NOT DETECTED NOT DETECTED Final   Astrovirus NOT DETECTED NOT DETECTED Final   Norovirus GI/GII NOT DETECTED NOT DETECTED Final   Rotavirus A NOT DETECTED NOT DETECTED Final   Sapovirus (I, II, IV, and V) NOT DETECTED NOT DETECTED Final    Comment: Performed at [facility], [address] [place], [place] [address]    Labs: CBC: Recent Labs  Lab 05/09/24 0225 05/10/24 0436 05/12/24 0413 05/14/24 0310 05/15/24 0256  WBC 14.0* 9.8 6.5 11.3* 11.2*  NEUTROABS  --   --   --  7.5 7.0  HGB 11.6* 9.9* 9.6* 9.8* 10.0*  HCT 36.7 30.5* 30.5* 31.8* 31.0*  MCV 92.7 92.1 92.4 92.7 92.5  PLT 329 287 322 357 332   Basic Metabolic Panel: Recent Labs  Lab 05/10/24 0436 05/11/24 0155 05/12/24 0413 05/14/24 0310 05/15/24 0256  NA 138 141 140 139 140  K 3.5 3.7 3.7 4.3 3.9  CL 110 111 109 109 109  CO2 22 23 25 23 26   GLUCOSE 98 104* 106* 90 96  BUN 28* 23 23 20 19   CREATININE 1.06* 1.04* 1.06* 0.91 0.89  CALCIUM  9.5 9.6 9.4 9.5 9.4  MG  --   --  2.4  --   --    Liver Function Tests: No results for input(s): AST, ALT, ALKPHOS, BILITOT, PROT, ALBUMIN in the last 168 hours. CBG: Recent Labs  Lab 05/14/24 1148 05/14/24 1639 05/14/24 2154 05/15/24 0754 05/15/24 1159  GLUCAP 116* 128* 116* 87 108*    Discharge time spent:  37 minutes.  Signed: Ezzard Holms, MD [facility] 05/15/2024

## 2024-07-21 NOTE — Progress Notes (Unsigned)
 03/03/23 11:15 AM    Carrie Larsen [facility] 01-26-1946 [id number]   Referring provider:  Edman Marsa PARAS, DO [address] [place],  [place] [address]   Urological history  1. Renal cell carcinoma - s/p left nephrectomy ~27 years ago for RCC - RUS 12/2021 - NED   2. Nephrolithiasis - no documented stones > 10 years - RUS on 01/22/2022 showed no stone burden      HPI: Carrie Larsen is a 78 y.o.female who presents today for a 1 year follow-up.  Previous records reviewed.     She was admitted in June for UTI that was complicated by severe abdominal pain and patient was found to have stercoral colitis and severe constipation throughout the colon on CT imaging.  The contrast CT performed while hospitalize noted moderate right hydronephrosis and right hydroureter extending down to the UVJ and previous right renal calculi that was seen on a previous CT renal stone study was not appreciated on these images.  Serum creatinine was 0.89, EGFR is greater than 60 in June.   Hemoglobin A1c was 5.6 in June.  She is on dialysis.       PMH:     Past Medical History:  Diagnosis Date   Anxiety     Arthritis      osteoarthritis   CAD (coronary artery disease)      a. 2012 ETT: no ischemia;  b. 06/2017 NSTEMI/PCI: LM nl, LAD nl, D1 70-80p, LCX nl, OM1 90 (3.0 x 23 Xience Alpine), RCA dominant, 100p/m, fills via L->R collats, EF 55-65%.   Cancer      a. s/p partial left nephrectomy.   Chicken pox     CME (cystoid macular edema), left 11/20/2018   Colon cancer 1992    T3, N1, M0. colon    Colon polyp 2011   COVID-19 07/2019   Diastolic dysfunction      a. 08/2015 Echo: EF 60-65%, no rwma, Gr1 DD, midlly dil LA, PASP . No significant valvular dzs; b. 06/2017 Echo: EF 60-65%, Gr1DD, mildly dil LA.   Essential hypertension     GERD (gastroesophageal reflux disease)     Gout     History of appendectomy 06/07/2015   History of kidney cancer 11/20/2018   History of kidney stones       passed - 2   Hyperlipidemia     Lumbar spinal stenosis     Myocardial infarction      06/2017   Nonexudative age-related macular degeneration, bilateral, early dry stage 11/20/2018   Obesity     Prediabetes     Pseudophakia of both eyes     Retinal cyst        Surgical History:      Past Surgical History:  Procedure Laterality Date   ABDOMINAL HYSTERECTOMY       APPENDECTOMY   06/07/2015   CARDIAC CATHETERIZATION       CATARACT EXTRACTION W/PHACO Left 04/24/2016    Procedure: CATARACT EXTRACTION PHACO AND INTRAOCULAR LENS PLACEMENT (IOC);  Surgeon: Elsie Carmine, MD;  Location: [facility] ORS;  Service: Ophthalmology;  Laterality: Left;  US  45.8 AP% 16.4 CDE 7.53 FLUID PACK LOT # [id number]   CATARACT EXTRACTION W/PHACO Right 06/05/2016    Procedure: CATARACT EXTRACTION PHACO AND INTRAOCULAR LENS PLACEMENT (IOC);  Surgeon: Elsie Carmine, MD;  Location: [facility];  Service: Ophthalmology;  Laterality: Right;  US  25.9 AP% 21.9 CDE 5.68 Fluid pack lot # [id number]   COLON RESECTION   03/04/1991  COLON SURGERY       COLONOSCOPY   03-14-10    Dr Dessa, tubular adenoma at 25 cm.   COLONOSCOPY WITH PROPOFOL  N/A 06/07/2015    Procedure: COLONOSCOPY WITH PROPOFOL ;  Surgeon: Reyes LELON Dessa, MD;  Location: [facility];  Service: Endoscopy;  Laterality: N/A;   COLONOSCOPY WITH PROPOFOL  N/A 08/10/2020    Procedure: COLONOSCOPY WITH PROPOFOL ;  Surgeon: Dessa Reyes LELON, MD;  Location: [facility] ENDOSCOPY;  Service: Endoscopy;  Laterality: N/A;   CORONARY STENT INTERVENTION N/A 06/28/2017    Procedure: CORONARY STENT INTERVENTION;  Surgeon: Florencio Cara BIRCH, MD;  Location: [facility] INVASIVE CV LAB;  Service: Cardiovascular;  Laterality: N/A;   EYE SURGERY        lens eye surgery   HERNIA REPAIR   1993    umbilical   LEFT HEART CATH AND CORONARY ANGIOGRAPHY N/A 06/28/2017    Procedure: LEFT HEART CATH AND CORONARY ANGIOGRAPHY;  Surgeon: Perla Evalene PARAS, MD;  Location: [facility] IN[facility] CV LAB;   Service: Cardiovascular;  Laterality: N/A;   LUMBAR LAMINECTOMY/DECOMPRESSION MICRODISCECTOMY Bilateral 03/10/2018    Procedure: Laminectomy and Foraminotomy - Lumbar three-Lumbar four - Lumbar four-Lumbar five - bilateral;  Surgeon: Louis Shove, MD;  Location: [facility] OR;  Service: Neurosurgery;  Laterality: Bilateral;   NEPHRECTOMY   1992    left- cancer   PERIPHERAL VASCULAR THROMBECTOMY Right 10/01/2022    Procedure: PERIPHERAL VASCULAR THROMBECTOMY;  Surgeon: Marea Selinda RAMAN, MD;  Location: [facility] INVASIVE CV LAB;  Service: Cardiovascular;  Laterality: Right;   RIB RESECTION        removal 4 ribs   TONSILLECTOMY          Home Medications:  Allergies as of 03/04/2023         Reactions    Morphine  And Related Nausea Only    Prednisone Nausea And Vomiting    Amlodipine  Palpitations    Latex Rash            Medication List           Accurate as of March 03, 2023 11:15 AM. If you have any questions, ask your nurse or doctor.              allopurinol  100 MG tablet Commonly known as: ZYLOPRIM  Take 2 tablets (200 mg total) by mouth daily.    apixaban  5 MG Tabs tablet Commonly known as: Eliquis  Take 1 tablet (5 mg total) by mouth daily at 6 (six) AM.    atorvastatin  80 MG tablet Commonly known as: LIPITOR  Take 1 tablet by mouth once daily    carvedilol  25 MG tablet Commonly known as: COREG  Take 1 tablet (25 mg total) by mouth 2 (two) times daily.    colchicine  0.6 MG tablet Take 1 tablet (0.6 mg total) by mouth daily. Take 1 tablet TID until GI upset or flare subsides, then for maintenance, take 1 tablet daily.    ColciGel Gel Apply gel topical as needed up to 2 times a day for gout.    esomeprazole 40 MG capsule Commonly known as: NEXIUM Take 40 mg by mouth daily in the afternoon.    furosemide  20 MG tablet Commonly known as: LASIX  Take 1-2 tablets (20-40 mg total) by mouth daily as needed.    Gemtesa  75 MG Tabs Generic drug: Vibegron  Take 75 mg by mouth daily.     ibuprofen 100 MG chewable tablet Commonly known as: ADVIL Chew by mouth every 8 (eight) hours as needed. Patient states MD told her  she can took three to four advil a day for pain up until today.    isosorbide  mononitrate 30 MG 24 hr tablet Commonly known as: IMDUR  TAKE 1 TABLET BY MOUTH IN THE MORNING AND AT BEDTIME    lidocaine  5 % Commonly known as: LIDODERM  Place 1 patch onto the skin daily. Remove & Discard patch within 12 hours or as directed by MD    multivitamin with minerals tablet Take 1 tablet by mouth daily.    nitroGLYCERIN  0.4 MG SL tablet Commonly known as: NITROSTAT  Place 1 tablet (0.4 mg total) under the tongue every 5 (five) minutes x 3 doses as needed for chest pain.    traMADol  50 MG tablet Commonly known as: ULTRAM  Take 1 tablet (50 mg total) by mouth every 4 (four) hours as needed.             Allergies:      Allergies  Allergen Reactions   Morphine  And Related Nausea Only   Prednisone Nausea And Vomiting   Amlodipine  Palpitations   Latex Rash      Family History:      Family History  Problem Relation Age of Onset   Colon cancer Mother     Cerebral aneurysm Father     Heart attack Father     Heart attack Brother 44   Healthy Son        Social History:  reports that she has never smoked. She has never been exposed to tobacco smoke. She has never used smokeless tobacco. She reports that she does not drink alcohol and does not use drugs.     Physical Exam: Blood pressure (!) 153/73, pulse 64, height 5' 10 (1.778 m), weight 272 lb 12.8 oz (123.7 kg).  Constitutional:  Well nourished. Alert and oriented, No acute distress. HEENT:  AT, moist mucus membranes.  Trachea midline, no masses. Cardiovascular: No clubbing, cyanosis, or edema. Respiratory: Normal respiratory effort, no increased work of breathing. GU: No CVA tenderness.  No bladder fullness or masses.  Recession of labia minora, dry, pale vulvar vaginal mucosa and loss of mucosal  ridges and folds.  Normal urethral meatus, no lesions, no prolapse, no discharge.   No urethral masses, tenderness and/or tenderness. No bladder fullness, tenderness or masses. *** vagina mucosa, *** estrogen effect, no discharge, no lesions, *** pelvic support, *** cystocele and *** rectocele noted.  No cervical motion tenderness.  Uterus is freely mobile and non-fixed.  No adnexal/parametria masses or tenderness noted.  Anus and perineum are without rashes or lesions.   ***  Neurologic: Grossly intact, no focal deficits, moving all 4 extremities. Psychiatric: Normal mood and affect.     Laboratory Data: See EPIC and HPI  I have reviewed the labs.    Pertinent Imaging: CLINICAL DATA:  Abdominal pain.  Stercoral colitis.   EXAM: CT ABDOMEN AND PELVIS WITH CONTRAST   TECHNIQUE: Multidetector CT imaging of the abdomen and pelvis was performed using the standard protocol following bolus administration of intravenous contrast.   RADIATION DOSE REDUCTION: This exam was performed according to the departmental dose-optimization program which includes automated exposure control, adjustment of the mA and/or kV according to patient size and/or use of iterative reconstruction technique.   CONTRAST:  75mL OMNIPAQUE  IOHEXOL  350 MG/ML SOLN   COMPARISON:  05/07/2019   FINDINGS: Lower chest: Coronary and aortic atherosclerosis with mild cardiomegaly. Continued left lower lobe airspace opacity with overlying accentuated space between the seventh and eighth ribs with some deformity and invagination  of subcutaneous tissues along the site.   Hepatobiliary: Cholelithiasis.  Otherwise unremarkable.   Pancreas: Unremarkable   Spleen: Unremarkable   Adrenals/Urinary Tract: Adrenal glands normal. Left nephrectomy. 3.1 cm right kidney lower pole cyst on image 51 series 2. No further imaging workup of this lesion is indicated.   Moderate right hydronephrosis and right hydroureter extending down to  the UVJ without visualized obstructing stone. This hydronephrosis is new. Previously seen right renal calculi are not well seen today.   Stomach/Bowel: Left lateral abdominal wall hernia along the transverse abdominis muscle with herniation of a margin of splenic flexure as on image 49 series 2.   Prominent stool throughout the colon favors constipation. Stool ball in the rectum with circumferential wall thickening in the rectum suspicious for stercoral colitis, single wall thickness 1.2 cm and previously 0.8 cm. No current pneumatosis or portal venous gas. Mass to Modic staple line at the rectosigmoid junction. No dilated small bowel.   Vascular/Lymphatic: Atherosclerosis is present, including aortoiliac atherosclerotic disease. Atheromatous plaque at the origin of the celiac trunk. Right external iliac vein stent.   Reproductive: Uterus absent. There is fluid along the vaginal vestibule, significance uncertain.   Other: Presacral and perirectal edema mildly increased from prior. Small perirectal lymph nodes. No overt ascites.   Musculoskeletal: Left posterolateral thoracic deformity is noted above. Left lateral abdominal wall hernia through the transverse abdominus muscle as noted above.   Thoracic spondylosis. Lower lumbar spondylosis and degenerative disc disease causing multilevel impingement particularly at L3-4, L4-5, and L5-S1.   IMPRESSION: 1. Prominent stool throughout the colon favors constipation. Stool ball in the rectum with circumferential wall thickening in the rectum suspicious for stercoral colitis, single wall thickness 1.2 cm and previously 0.8 cm. Presacral and perirectal edema mildly increased from prior. Small perirectal lymph nodes. 2. Moderate right hydronephrosis and right hydroureter extending down to the UVJ without visualized obstructing stone. This hydronephrosis is new. Previously seen right renal calculi are not well seen today. A specific  cause for the right hydronephrosis and hydroureter is not observed. 3. Left lateral abdominal wall hernia along the transverse abdominus muscle with herniation of a margin of splenic flexure. 4. Continued left lower lobe airspace opacity with overlying accentuated space between the seventh and eighth ribs with some deformity and invagination of subcutaneous tissues along the site. 5. Cholelithiasis. 6. Lower lumbar impingement. 7.  Aortic Atherosclerosis (ICD10-I70.0).     Electronically Signed   By: Ryan Salvage M.D.   On: 05/08/2024 14:54 I have independently reviewed the films.  See HPI.     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  Nephrolithiasis -Asymptomatic -RUS pending   3. Nocturia  -not bothersome   CLOTILDA CORNWALL, PA-C    [facility] [address] [place], [place] [address] [phone]

## 2024-07-22 ENCOUNTER — Encounter: Payer: Self-pay | Admitting: Urology

## 2024-07-22 ENCOUNTER — Ambulatory Visit (INDEPENDENT_AMBULATORY_CARE_PROVIDER_SITE_OTHER): Admitting: Urology

## 2024-07-22 VITALS — BP 138/79 | HR 96 | Ht 70.0 in | Wt 208.0 lb

## 2024-07-22 DIAGNOSIS — N29 Other disorders of kidney and ureter in diseases classified elsewhere: Secondary | ICD-10-CM

## 2024-07-22 DIAGNOSIS — N393 Stress incontinence (female) (male): Secondary | ICD-10-CM

## 2024-07-22 DIAGNOSIS — Z905 Acquired absence of kidney: Secondary | ICD-10-CM | POA: Diagnosis not present

## 2024-07-22 MED ORDER — GEMTESA 75 MG PO TABS: 75.0000 mg | ORAL_TABLET | Freq: Every day | ORAL | 0 refills | Status: DC

## 2024-08-14 ENCOUNTER — Ambulatory Visit: Admitting: Family Medicine

## 2024-08-21 ENCOUNTER — Ambulatory Visit (INDEPENDENT_AMBULATORY_CARE_PROVIDER_SITE_OTHER): Admitting: Family Medicine

## 2024-08-21 VITALS — BP 130/64 | HR 83 | Ht 70.0 in | Wt 208.0 lb

## 2024-08-21 DIAGNOSIS — Z86718 Personal history of other venous thrombosis and embolism: Secondary | ICD-10-CM

## 2024-08-21 DIAGNOSIS — Z8781 Personal history of (healed) traumatic fracture: Secondary | ICD-10-CM

## 2024-08-21 DIAGNOSIS — K219 Gastro-esophageal reflux disease without esophagitis: Secondary | ICD-10-CM

## 2024-08-21 DIAGNOSIS — I10 Essential (primary) hypertension: Secondary | ICD-10-CM

## 2024-08-21 DIAGNOSIS — R29898 Other symptoms and signs involving the musculoskeletal system: Secondary | ICD-10-CM

## 2024-08-21 DIAGNOSIS — T07XXXA Unspecified multiple injuries, initial encounter: Secondary | ICD-10-CM | POA: Insufficient documentation

## 2024-08-21 DIAGNOSIS — I25118 Atherosclerotic heart disease of native coronary artery with other forms of angina pectoris: Secondary | ICD-10-CM | POA: Diagnosis not present

## 2024-08-21 DIAGNOSIS — E782 Mixed hyperlipidemia: Secondary | ICD-10-CM | POA: Diagnosis not present

## 2024-08-21 DIAGNOSIS — M109 Gout, unspecified: Secondary | ICD-10-CM

## 2024-08-21 DIAGNOSIS — M48062 Spinal stenosis, lumbar region with neurogenic claudication: Secondary | ICD-10-CM

## 2024-08-21 DIAGNOSIS — G8194 Hemiplegia, unspecified affecting left nondominant side: Secondary | ICD-10-CM

## 2024-08-21 DIAGNOSIS — Z87898 Personal history of other specified conditions: Secondary | ICD-10-CM

## 2024-08-21 DIAGNOSIS — N3000 Acute cystitis without hematuria: Secondary | ICD-10-CM

## 2024-08-21 DIAGNOSIS — F419 Anxiety disorder, unspecified: Secondary | ICD-10-CM

## 2024-08-21 MED ORDER — APIXABAN 2.5 MG PO TABS: 2.5000 mg | ORAL_TABLET | Freq: Two times a day (BID) | ORAL | 3 refills | Status: DC

## 2024-08-21 MED ORDER — BUSPIRONE HCL 5 MG PO TABS: 5.0000 mg | ORAL_TABLET | Freq: Two times a day (BID) | ORAL | 3 refills | Status: AC

## 2024-08-21 MED ORDER — LAMOTRIGINE 150 MG PO TABS: 150.0000 mg | ORAL_TABLET | Freq: Every day | ORAL | 3 refills | Status: DC

## 2024-08-21 MED ORDER — PANTOPRAZOLE SODIUM 40 MG PO TBEC: 40.0000 mg | DELAYED_RELEASE_TABLET | Freq: Every day | ORAL | 3 refills | Status: AC

## 2024-08-21 MED ORDER — SULFAMETHOXAZOLE-TRIMETHOPRIM 800-160 MG PO TABS
1.0000 | ORAL_TABLET | Freq: Two times a day (BID) | ORAL | 0 refills | Status: DC
Start: 2024-08-21 — End: 2024-08-26

## 2024-08-21 MED ORDER — ATORVASTATIN CALCIUM 80 MG PO TABS: 80.0000 mg | ORAL_TABLET | Freq: Every day | ORAL | 3 refills | Status: AC

## 2024-08-21 NOTE — Progress Notes (Signed)
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 traumatic brain injury, managed with lamotrigine , also serving as a mood stabilizer. - Continue lamotrigine  as prescribed. - Consider future consultation with neurology to assess ongoing need for medication.  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 Diarrhea management with Imodium  requires careful balance to avoid constipation. - Use Imodium  as needed to manage diarrhea, with caution to avoid constipation. - Consider over-the-counter options like peppermint oil or Pepto Bismol for diarrhea  management.  Recurrent urinary tract infections Recurrent UTIs, possibly related to neurogenic bladder dysfunction. Currently on Bactrim  for a UTI. - Continue Bactrim  for three more days to complete the course for current UTI. - Monitor for symptoms of UTI and follow up as needed.  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 - Continue Eliquis  2.5 mg twice daily. - Order Eliquis  and ensure supply is maintained.  Anxiety disorder Anxiety disorder managed with Buspar . - Continue Buspar  as prescribed.  Hyperlipidemia Hyperlipidemia managed with atorvastatin . - Continue atorvastatin  80 mg daily. - Order atorvastatin  for 90-day supply.  Gastroesophageal reflux disease (GERD) GERD managed with pantoprazole . - Continue pantoprazole  as prescribed.      Referral to [facility] team for further case management She as [facility] PT OT initiating, which is medically necessary for her at this time to improve  her mobility and function She will benefit from home aid through [facility] company or separate Personal Care Services if eligible for ADLs iADLs cleaning, meal prep, chores, bathing hygiene transportation if need.   Orders Placed This Encounter  Procedures   AMB Referral VBCI Care Management    Referral Priority:   Routine    Referral Type:   Consultation    Referral Reason:   Care Coordination    Number of Visits Requested:   1    Meds ordered this encounter  Medications   atorvastatin  (LIPITOR ) 80 MG tablet    Sig: Take 1 tablet (80 mg total) by mouth daily.    Dispense:  90 tablet    Refill:  3   apixaban  (ELIQUIS ) 2.5 MG TABS tablet    Sig: Take 1 tablet (2.5 mg total) by mouth 2 (two) times daily.    Dispense:  180 tablet    Refill:  3   busPIRone  (BUSPAR ) 5 MG tablet    Sig: Take 1 tablet (5 mg total) by mouth 2 (two) times daily.    Dispense:  180 tablet    Refill:  3    lamoTRIgine  (LAMICTAL ) 150 MG tablet    Sig: Take 1 tablet (150 mg total) by mouth daily.    Dispense:  90 tablet    Refill:  3   pantoprazole  (PROTONIX ) 40 MG tablet    Sig: Take 1 tablet (40 mg total) by mouth daily before breakfast.    Dispense:  90 tablet    Refill:  3   sulfamethoxazole -trimethoprim  (BACTRIM  DS) 800-160 MG tablet    Sig: Take 1 tablet by mouth 2 (two) times daily. For 3 days    Dispense:  6 tablet    Refill:  0    Follow up plan: Return in about 3 weeks (around 09/11/2024) for 3 week follow-up.   Marsa Officer, DO [facility] 08/21/2024, 10:11 AM

## 2024-08-21 NOTE — Patient Instructions (Addendum)
 Thank you for coming to the office today.  Case Management team will call to contact you and setup  [facility] OT  Refills sent.  Please schedule a Follow-up Appointment to: Return in about 3 weeks (around 09/11/2024) for 3 week follow-up.  If you have any other questions or concerns, please feel free to call the office or send a message through MyChart. You may also schedule an earlier appointment if necessary.  Additionally, you may be receiving a survey about your experience at our office within a few days to 1 week by e-mail or mail. We value your feedback.  Marsa Officer, DO [facility], [place]

## 2024-08-22 ENCOUNTER — Encounter: Payer: Self-pay | Admitting: Family Medicine

## 2024-08-24 ENCOUNTER — Encounter: Payer: Self-pay | Admitting: Emergency Medicine

## 2024-08-24 ENCOUNTER — Emergency Department

## 2024-08-24 ENCOUNTER — Inpatient Hospital Stay
Admission: EM | Admit: 2024-08-24 | Discharge: 2024-08-26 | DRG: 178 | Disposition: A | Discharge status: Home Health | Attending: Internal Medicine | Admitting: Internal Medicine

## 2024-08-24 ENCOUNTER — Other Ambulatory Visit: Payer: Self-pay

## 2024-08-24 DIAGNOSIS — I10 Essential (primary) hypertension: Secondary | ICD-10-CM | POA: Diagnosis present

## 2024-08-24 DIAGNOSIS — Z66 Do not resuscitate: Secondary | ICD-10-CM | POA: Diagnosis present

## 2024-08-24 DIAGNOSIS — E669 Obesity, unspecified: Secondary | ICD-10-CM | POA: Diagnosis present

## 2024-08-24 DIAGNOSIS — R197 Diarrhea, unspecified: Secondary | ICD-10-CM | POA: Diagnosis present

## 2024-08-24 DIAGNOSIS — G40909 Epilepsy, unspecified, not intractable, without status epilepticus: Secondary | ICD-10-CM | POA: Diagnosis present

## 2024-08-24 DIAGNOSIS — K219 Gastro-esophageal reflux disease without esophagitis: Secondary | ICD-10-CM | POA: Diagnosis present

## 2024-08-24 DIAGNOSIS — G4700 Insomnia, unspecified: Secondary | ICD-10-CM | POA: Diagnosis present

## 2024-08-24 DIAGNOSIS — Z6829 Body mass index (BMI) 29.0-29.9, adult: Secondary | ICD-10-CM

## 2024-08-24 DIAGNOSIS — J189 Pneumonia, unspecified organism: Secondary | ICD-10-CM | POA: Diagnosis not present

## 2024-08-24 DIAGNOSIS — I2510 Atherosclerotic heart disease of native coronary artery without angina pectoris: Secondary | ICD-10-CM | POA: Diagnosis present

## 2024-08-24 DIAGNOSIS — Z8249 Family history of ischemic heart disease and other diseases of the circulatory system: Secondary | ICD-10-CM

## 2024-08-24 DIAGNOSIS — A0839 Other viral enteritis: Secondary | ICD-10-CM | POA: Diagnosis present

## 2024-08-24 DIAGNOSIS — Z87442 Personal history of urinary calculi: Secondary | ICD-10-CM | POA: Diagnosis not present

## 2024-08-24 DIAGNOSIS — Z800 Family history of malignant neoplasm of digestive organs: Secondary | ICD-10-CM

## 2024-08-24 DIAGNOSIS — Z955 Presence of coronary angioplasty implant and graft: Secondary | ICD-10-CM | POA: Diagnosis not present

## 2024-08-24 DIAGNOSIS — Z86718 Personal history of other venous thrombosis and embolism: Secondary | ICD-10-CM

## 2024-08-24 DIAGNOSIS — Z91040 Latex allergy status: Secondary | ICD-10-CM

## 2024-08-24 DIAGNOSIS — F419 Anxiety disorder, unspecified: Secondary | ICD-10-CM | POA: Diagnosis present

## 2024-08-24 DIAGNOSIS — Z87440 Personal history of urinary (tract) infections: Secondary | ICD-10-CM

## 2024-08-24 DIAGNOSIS — Z7901 Long term (current) use of anticoagulants: Secondary | ICD-10-CM | POA: Diagnosis not present

## 2024-08-24 DIAGNOSIS — U071 COVID-19: Secondary | ICD-10-CM | POA: Diagnosis present

## 2024-08-24 DIAGNOSIS — N179 Acute kidney failure, unspecified: Secondary | ICD-10-CM | POA: Diagnosis present

## 2024-08-24 DIAGNOSIS — E785 Hyperlipidemia, unspecified: Secondary | ICD-10-CM | POA: Diagnosis present

## 2024-08-24 DIAGNOSIS — I252 Old myocardial infarction: Secondary | ICD-10-CM

## 2024-08-24 DIAGNOSIS — Z888 Allergy status to other drugs, medicaments and biological substances status: Secondary | ICD-10-CM

## 2024-08-24 DIAGNOSIS — Z85038 Personal history of other malignant neoplasm of large intestine: Secondary | ICD-10-CM

## 2024-08-24 DIAGNOSIS — Z905 Acquired absence of kidney: Secondary | ICD-10-CM | POA: Diagnosis not present

## 2024-08-24 DIAGNOSIS — Z9049 Acquired absence of other specified parts of digestive tract: Secondary | ICD-10-CM

## 2024-08-24 DIAGNOSIS — Z885 Allergy status to narcotic agent status: Secondary | ICD-10-CM

## 2024-08-24 DIAGNOSIS — I25118 Atherosclerotic heart disease of native coronary artery with other forms of angina pectoris: Secondary | ICD-10-CM | POA: Diagnosis present

## 2024-08-24 DIAGNOSIS — Z79899 Other long term (current) drug therapy: Secondary | ICD-10-CM

## 2024-08-24 DIAGNOSIS — Z85528 Personal history of other malignant neoplasm of kidney: Secondary | ICD-10-CM | POA: Diagnosis not present

## 2024-08-24 LAB — COMPREHENSIVE METABOLIC PANEL WITH GFR
ALT: 36 U/L (ref 0–44)
AST: 30 U/L (ref 15–41)
Albumin: 3.2 g/dL — ABNORMAL LOW (ref 3.5–5.0)
Alkaline Phosphatase: 111 U/L (ref 38–126)
Anion gap: 9 (ref 5–15)
BUN: 30 mg/dL — ABNORMAL HIGH (ref 8–23)
CO2: 22 mmol/L (ref 22–32)
Calcium: 9.3 mg/dL (ref 8.9–10.3)
Chloride: 109 mmol/L (ref 98–111)
Creatinine, Ser: 1.41 mg/dL — ABNORMAL HIGH (ref 0.44–1.00)
GFR, Estimated: 38 mL/min — ABNORMAL LOW (ref >60)
Glucose, Bld: 88 mg/dL (ref 70–99)
Potassium: 4.5 mmol/L (ref 3.5–5.1)
Sodium: 140 mmol/L (ref 135–145)
Total Bilirubin: 0.6 mg/dL (ref 0.0–1.2)
Total Protein: 6 g/dL — ABNORMAL LOW (ref 6.5–8.1)

## 2024-08-24 LAB — C DIFFICILE QUICK SCREEN W PCR REFLEX
C Diff antigen: NEGATIVE
C Diff interpretation: NOT DETECTED
C Diff toxin: NEGATIVE

## 2024-08-24 LAB — PROCALCITONIN: Procalcitonin: <0.1 ng/mL

## 2024-08-24 LAB — CBC
HCT: 34.4 % — ABNORMAL LOW (ref 36.0–46.0)
Hemoglobin: 10.8 g/dL — ABNORMAL LOW (ref 12.0–15.0)
MCH: 28 pg (ref 26.0–34.0)
MCHC: 31.4 g/dL (ref 30.0–36.0)
MCV: 89.1 fL (ref 80.0–100.0)
Platelets: 218 K/uL (ref 150–400)
RBC: 3.86 MIL/uL — ABNORMAL LOW (ref 3.87–5.11)
RDW: 13.9 % (ref 11.5–15.5)
WBC: 4.7 K/uL (ref 4.0–10.5)
nRBC: 0 % (ref 0.0–0.2)

## 2024-08-24 LAB — LIPASE, BLOOD: Lipase: 43 U/L (ref 11–51)

## 2024-08-24 LAB — RESP PANEL BY RT-PCR (RSV, FLU A&B, COVID)  RVPGX2
Influenza A by PCR: NEGATIVE
Influenza B by PCR: NEGATIVE
Resp Syncytial Virus by PCR: NEGATIVE
SARS Coronavirus 2 by RT PCR: POSITIVE — AB

## 2024-08-24 MED ORDER — LAMOTRIGINE 100 MG PO TABS
150.0000 mg | ORAL_TABLET | Freq: Every day | ORAL | Status: DC
  Administered 2024-08-25 – 2024-08-26 (×2): 150 mg via ORAL
  Filled 2024-08-24 (×2): qty 2

## 2024-08-24 MED ORDER — ONDANSETRON HCL 4 MG PO TABS: 4.0000 mg | ORAL_TABLET | Freq: Four times a day (QID) | ORAL | Status: DC | PRN

## 2024-08-24 MED ORDER — AZITHROMYCIN 500 MG IV SOLR
500.0000 mg | INTRAVENOUS | Status: DC
  Administered 2024-08-25: 500 mg via INTRAVENOUS
  Filled 2024-08-24: qty 5

## 2024-08-24 MED ORDER — HYDRALAZINE HCL 20 MG/ML IJ SOLN: 5.0000 mg | Freq: Four times a day (QID) | INTRAMUSCULAR | Status: DC | PRN

## 2024-08-24 MED ORDER — SODIUM CHLORIDE 0.9 % IV SOLN
500.0000 mg | Freq: Once | INTRAVENOUS | Status: AC
  Administered 2024-08-24: 500 mg via INTRAVENOUS
  Filled 2024-08-24: qty 5

## 2024-08-24 MED ORDER — LORAZEPAM 0.5 MG PO TABS: 0.5000 mg | ORAL_TABLET | Freq: Every evening | ORAL | Status: DC | PRN

## 2024-08-24 MED ORDER — BUSPIRONE HCL 10 MG PO TABS
5.0000 mg | ORAL_TABLET | Freq: Two times a day (BID) | ORAL | Status: DC
  Administered 2024-08-24 – 2024-08-26 (×4): 5 mg via ORAL
  Filled 2024-08-24 (×4): qty 1

## 2024-08-24 MED ORDER — SODIUM CHLORIDE 0.9 % IV SOLN
Freq: Once | INTRAVENOUS | Status: AC
Start: 2024-08-24 — End: 2024-08-25

## 2024-08-24 MED ORDER — ACETAMINOPHEN 650 MG RE SUPP: 650.0000 mg | Freq: Four times a day (QID) | RECTAL | Status: DC | PRN

## 2024-08-24 MED ORDER — APIXABAN 2.5 MG PO TABS
2.5000 mg | ORAL_TABLET | Freq: Two times a day (BID) | ORAL | Status: DC
  Administered 2024-08-24 – 2024-08-26 (×4): 2.5 mg via ORAL
  Filled 2024-08-24 (×4): qty 1

## 2024-08-24 MED ORDER — SODIUM CHLORIDE 0.9 % IV SOLN: 1.0000 g | Freq: Once | INTRAVENOUS | Status: DC

## 2024-08-24 MED ORDER — ONDANSETRON HCL 4 MG/2ML IJ SOLN: 4.0000 mg | Freq: Four times a day (QID) | INTRAMUSCULAR | Status: DC | PRN

## 2024-08-24 MED ORDER — ATORVASTATIN CALCIUM 20 MG PO TABS
80.0000 mg | ORAL_TABLET | Freq: Every day | ORAL | Status: DC
  Administered 2024-08-25: 80 mg via ORAL
  Filled 2024-08-24: qty 4

## 2024-08-24 MED ORDER — LORAZEPAM 0.5 MG PO TABS
0.5000 mg | ORAL_TABLET | Freq: Four times a day (QID) | ORAL | Status: DC | PRN
Start: 2024-08-24 — End: 2024-08-24

## 2024-08-24 MED ORDER — PANTOPRAZOLE SODIUM 40 MG PO TBEC
40.0000 mg | DELAYED_RELEASE_TABLET | Freq: Every day | ORAL | Status: DC
  Administered 2024-08-25 – 2024-08-26 (×2): 40 mg via ORAL
  Filled 2024-08-24 (×2): qty 1

## 2024-08-24 MED ORDER — CEFTRIAXONE SODIUM 2 G IJ SOLR
2.0000 g | INTRAMUSCULAR | Status: DC
  Administered 2024-08-24: 2 g via INTRAVENOUS
  Filled 2024-08-24 (×2): qty 20

## 2024-08-24 MED ORDER — ACETAMINOPHEN 325 MG PO TABS: 650.0000 mg | ORAL_TABLET | Freq: Four times a day (QID) | ORAL | Status: DC | PRN

## 2024-08-24 NOTE — Assessment & Plan Note (Signed)
 Home atorvastatin  80 mg nightly resume
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
## 2024-08-24 NOTE — Assessment & Plan Note (Signed)
 Check MRSA PCR, if positive will initiate vancomycin per pharmacy

## 2024-08-24 NOTE — Assessment & Plan Note (Signed)
 Home Eliquis  2.5 mg p.o. twice daily resume A.m. team to follow-up outpatient with PCP for determination of need to continue this medication

## 2024-08-24 NOTE — Assessment & Plan Note (Signed)
 Continue with azithromycin  and ceftriaxone IV, to complete a 5-day course Incentive pulmonary, flutter valve Check MRSA PCR, if positive will initiate vancomycin per pharmacy Incentive spirometry, flutter valve Admitted telemetry medical, inpatient

## 2024-08-24 NOTE — Hospital Course (Signed)
 Ms. Gia Lusher is a 78 year old female with history of hyperlipidemia, CAD status post PCI to OM with occluded RCA, hypertension, GERD, colon cancer, history of left nephrectomy, history of right-sided DVT in 2023 on Eliquis , who presents for chief concerns of cough, diarrhea.  Vitals in the ED showed tof 98, rr 18, hr 70, blood pressure 124/88, SpO2 97 reported on room air.  Serum sodium is 140, potassium 4.5, chloride 109, bicarb 22, BUN 30, serum creatinine 1.41, eGFR 38, nonfasting blood glucose 88, WBC 4.7, hemoglobin 10.8, platelets of 218.  Patient tested positive for COVID-19.  ED treatment: Azithromycin  500 mg IV one-time dose, ceftriaxone IV 1 g one-time dose.
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
## 2024-08-24 NOTE — ED Notes (Signed)
 Incontinent loose stool. Patient cleaned, linen changed, clean brief placed on patient. Tolerated well.

## 2024-08-24 NOTE — ED Triage Notes (Signed)
 Patient to ED via [facility] from home for diarrhea. Ongoing since Saturday when dc'd from [employer]. Currently taking antibiotics for an UTI. Denies pain.

## 2024-08-24 NOTE — Assessment & Plan Note (Addendum)
 With patient endorsing anxiety Patient requesting something to help her sleep and help her with her anxiety Lorazepam  0.5 mg nightly as needed for sleep, anxiety, 2 doses ordered on admission PDMP reviewed, currently no active prescription
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
## 2024-08-24 NOTE — Assessment & Plan Note (Signed)
 With recent completion of antibiotic for UTI Check C. difficile, GI panel No antidiarrheal medication will be given until the above is negative for infectious diarrhea

## 2024-08-24 NOTE — Assessment & Plan Note (Signed)
 Hydralazine  5 mg IV every 6 hours as needed for SBP, 175, 5 days ordered

## 2024-08-25 ENCOUNTER — Encounter: Payer: Self-pay | Admitting: Internal Medicine

## 2024-08-25 ENCOUNTER — Ambulatory Visit: Admitting: Urology

## 2024-08-25 DIAGNOSIS — J189 Pneumonia, unspecified organism: Secondary | ICD-10-CM | POA: Diagnosis not present

## 2024-08-25 LAB — CBC
HCT: 32 % — ABNORMAL LOW (ref 36.0–46.0)
Hemoglobin: 9.8 g/dL — ABNORMAL LOW (ref 12.0–15.0)
MCH: 28.2 pg (ref 26.0–34.0)
MCHC: 30.6 g/dL (ref 30.0–36.0)
MCV: 92.2 fL (ref 80.0–100.0)
Platelets: 193 K/uL (ref 150–400)
RBC: 3.47 MIL/uL — ABNORMAL LOW (ref 3.87–5.11)
RDW: 14 % (ref 11.5–15.5)
WBC: 3.5 K/uL — ABNORMAL LOW (ref 4.0–10.5)
nRBC: 0 % (ref 0.0–0.2)

## 2024-08-25 LAB — GASTROINTESTINAL PANEL BY PCR, STOOL (REPLACES STOOL CULTURE)

## 2024-08-25 LAB — BASIC METABOLIC PANEL WITH GFR
Anion gap: 7 (ref 5–15)
BUN: 28 mg/dL — ABNORMAL HIGH (ref 8–23)
CO2: 22 mmol/L (ref 22–32)
Calcium: 8.8 mg/dL — ABNORMAL LOW (ref 8.9–10.3)
Chloride: 111 mmol/L (ref 98–111)
Creatinine, Ser: 1.33 mg/dL — ABNORMAL HIGH (ref 0.44–1.00)
GFR, Estimated: 41 mL/min — ABNORMAL LOW (ref >60)
Glucose, Bld: 87 mg/dL (ref 70–99)
Potassium: 3.9 mmol/L (ref 3.5–5.1)
Sodium: 140 mmol/L (ref 135–145)

## 2024-08-25 LAB — MRSA NEXT GEN BY PCR, NASAL: MRSA by PCR Next Gen: NOT DETECTED

## 2024-08-25 MED ORDER — LOPERAMIDE HCL 2 MG PO CAPS
2.0000 mg | ORAL_CAPSULE | ORAL | Status: DC | PRN
  Administered 2024-08-25: 2 mg via ORAL
  Filled 2024-08-25: qty 1

## 2024-08-25 MED ORDER — LACTATED RINGERS IV SOLN: INTRAVENOUS | Status: AC

## 2024-08-25 NOTE — Plan of Care (Signed)
  Problem: Education: Goal: Knowledge of General Education information will improve Description: Including pain rating scale, medication(s)/side effects and non-pharmacologic comfort measures Outcome: Progressing   Problem: Activity: Goal: Risk for activity intolerance will decrease Outcome: Progressing   Problem: Nutrition: Goal: Adequate nutrition will be maintained Outcome: Progressing   Problem: Elimination: Goal: Will not experience complications related to bowel motility Outcome: Progressing Goal: Will not experience complications related to urinary retention Outcome: Progressing   Problem: Pain Managment: Goal: General experience of comfort will improve and/or be controlled Outcome: Progressing   Problem: Safety: Goal: Ability to remain free from injury will improve Outcome: Progressing   Problem: Skin Integrity: Goal: Risk for impaired skin integrity will decrease Outcome: Progressing   Problem: Activity: Goal: Ability to tolerate increased activity will improve Outcome: Progressing   Problem: Clinical Measurements: Goal: Ability to maintain a body temperature in the normal range will improve Outcome: Progressing   Problem: Respiratory: Goal: Ability to maintain adequate ventilation will improve Outcome: Progressing  Goal: Ability to maintain a clear airway will improve Outcome: Progressing  Incentive spirometer and flutter valve at bedside as ordered; pt using flutter valve
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
## 2024-08-26 DIAGNOSIS — J189 Pneumonia, unspecified organism: Secondary | ICD-10-CM | POA: Diagnosis not present

## 2024-08-26 LAB — BASIC METABOLIC PANEL WITH GFR
Anion gap: 8 (ref 5–15)
BUN: 27 mg/dL — ABNORMAL HIGH (ref 8–23)
CO2: 22 mmol/L (ref 22–32)
Calcium: 9 mg/dL (ref 8.9–10.3)
Chloride: 112 mmol/L — ABNORMAL HIGH (ref 98–111)
Creatinine, Ser: 1.2 mg/dL — ABNORMAL HIGH (ref 0.44–1.00)
GFR, Estimated: 46 mL/min — ABNORMAL LOW (ref >60)
Glucose, Bld: 89 mg/dL (ref 70–99)
Potassium: 4.2 mmol/L (ref 3.5–5.1)
Sodium: 142 mmol/L (ref 135–145)

## 2024-08-26 LAB — CBC
HCT: 33.1 % — ABNORMAL LOW (ref 36.0–46.0)
Hemoglobin: 10.2 g/dL — ABNORMAL LOW (ref 12.0–15.0)
MCH: 27.8 pg (ref 26.0–34.0)
MCHC: 30.8 g/dL (ref 30.0–36.0)
MCV: 90.2 fL (ref 80.0–100.0)
Platelets: 219 K/uL (ref 150–400)
RBC: 3.67 MIL/uL — ABNORMAL LOW (ref 3.87–5.11)
RDW: 13.8 % (ref 11.5–15.5)
WBC: 4.5 K/uL (ref 4.0–10.5)
nRBC: 0 % (ref 0.0–0.2)

## 2024-08-26 MED ORDER — LOPERAMIDE HCL 2 MG PO CAPS: 2.0000 mg | ORAL_CAPSULE | ORAL | 0 refills | Status: DC | PRN

## 2024-08-26 MED ORDER — DIPHENOXYLATE-ATROPINE 2.5-0.025 MG PO TABS: 1.0000 | ORAL_TABLET | Freq: Four times a day (QID) | ORAL | 0 refills | Status: DC | PRN

## 2024-08-26 MED ORDER — LOPERAMIDE HCL 2 MG PO CAPS: 2.0000 mg | ORAL_CAPSULE | ORAL | 0 refills | Status: AC | PRN

## 2024-08-26 NOTE — TOC Transition Note (Signed)
 Transition of Care [facility]) - Discharge Note   Patient Details  Name: Carrie Larsen MRN: [id number] Date of Birth: [birth year]  [facility] CM/SW Contact:  Dalia GORMAN Fuse, RN Phone Number: 08/26/2024, 10:41 AM   Clinical Narrative:     Patient is medically clear to discharge to home with home health. Patient was active with [facility] for [facility] PT/OT. RN, CM sent message to [name] to resume [facility] PT/OT. No other TOC needs.  Final next level of care: Home w Home Health Services Barriers to Discharge: Barriers Resolved   Patient Goals and CMS Choice            Discharge Placement                       Discharge Plan and Services Additional resources added to the After Visit Summary for                            HH Arranged: PT, OT HH Agency: [facility] Date [facility] Agency Contacted: 08/26/24 Time HH Agency Contacted: 1040 Representative spoke with at [facility] Agency: [name]  Social Drivers of Health (SDOH) Interventions SDOH Screenings   Food Insecurity: No Food Insecurity (08/25/2024)  Housing: Low Risk  (08/25/2024)  Transportation Needs: No Transportation Needs (08/25/2024)  Utilities: Not At Risk (08/25/2024)  Alcohol Screen: Low Risk  (02/27/2023)  Depression (PHQ2-9): Low Risk  (08/21/2024)  Financial Resource Strain: Patient Unable To Answer (07/04/2023)   Received from [facility]  Social Connections: Patient Declined (08/25/2024)  Stress: No Stress Concern Present (09/04/2023)   Received from [facility]  Recent Concern: Stress - Stress Concern Present (07/02/2023)   Received from [facility]  Tobacco Use: Low Risk  (08/25/2024)     Readmission Risk Interventions     No data to display

## 2024-08-26 NOTE — TOC Transition Note (Signed)
 Transition of Care [facility]) - Discharge Note   Patient Details  Name: Carrie Larsen MRN: [id number] Date of Birth: Jan 27, 1946  Transition of Care [facility]) CM/SW Contact:  Dalia GORMAN Fuse, RN Phone Number: 08/26/2024, 11:26 AM   Clinical Narrative:     Patient is medically clear to discharge to home w/ hh PT/OT. Cheryl with [facility] accepted the referral to resume HH. [facility] to transport.  Final next level of care: Other (comment) ([address] [place] [id number]) Barriers to Discharge: Barriers Resolved   Patient Goals and CMS Choice            Discharge Placement                       Discharge Plan and Services Additional resources added to the After Visit Summary for                            [facility] Arranged: PT, OT HH Agency: [facility] Date [facility] Agency Contacted: 08/26/24 Time HH Agency Contacted: 1040 Representative spoke with at [facility] Agency: [name]  Social Drivers of Health (SDOH) Interventions SDOH Screenings   Food Insecurity: No Food Insecurity (08/25/2024)  Housing: Low Risk  (08/25/2024)  Transportation Needs: No Transportation Needs (08/25/2024)  Utilities: Not At Risk (08/25/2024)  Alcohol Screen: Low Risk  (02/27/2023)  Depression (PHQ2-9): Low Risk  (08/21/2024)  Financial Resource Strain: Patient Unable To Answer (07/04/2023)   Received from [facility]  Social Connections: Patient Declined (08/25/2024)  Stress: No Stress Concern Present (09/04/2023)   Received from [facility]  Recent Concern: Stress - Stress Concern Present (07/02/2023)   Received from [facility]  Tobacco Use: Low Risk  (08/25/2024)     Readmission Risk Interventions     No data to display
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
## 2024-08-26 NOTE — Plan of Care (Signed)

## 2024-08-26 NOTE — TOC Initial Note (Signed)
 Transition of Care [facility]) - Initial/Assessment Note    Patient Details  Name: Carrie Larsen MRN: [id number] Date of Birth: Jul 30, 1946  Transition of [facility]) CM/SW Contact:    Dalia GORMAN Fuse, RN Phone Number: 08/26/2024, 9:36 AM  Clinical Narrative:                 Patient presented to ED from home via EMS with diarrhea, found to be COVID +. Medical record reviewed and patient has no TOC needs at this time. Please outreach to [facility] if needs are identified.         Patient Goals and CMS Choice            Expected Discharge Plan and Services                                              Prior Living Arrangements/Services                       Activities of Daily Living   ADL Screening (condition at time of admission) Independently performs ADLs?: Yes (appropriate for developmental age) Is the patient deaf or have difficulty hearing?: No Does the patient have difficulty seeing, even when wearing glasses/contacts?: No Does the patient have difficulty concentrating, remembering, or making decisions?: No  Permission Sought/Granted                  Emotional Assessment              Admission diagnosis:  Community acquired pneumonia [J18.9] Pneumonia of left lower lobe due to infectious organism [J18.9] Diarrhea, unspecified type [R19.7] COVID-19 [U07.1] Patient Active Problem List   Diagnosis Date Noted   Community acquired pneumonia 08/24/2024   Diarrhea 08/24/2024   COVID-19 virus infection 08/24/2024   Insomnia 08/24/2024   History of spinal fracture 08/21/2024   Multiple traumatic injuries 08/21/2024   Hemiparesis, left (HCC) 08/21/2024   Fecal impaction (HCC) 05/09/2024   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   Constipation 05/08/2024   CAP (community acquired pneumonia) 05/08/2024   H/O left nephrectomy 05/08/2024   Seizures (HCC) 05/08/2024   Altered mental status 05/08/2024   Overflow diarrhea 05/07/2024   Ambulatory  dysfunction 05/07/2024   Hypokalemia 05/07/2024   Urinary tract infection 05/06/2024   UTI (urinary tract infection) 05/06/2024   Pain in limb 03/26/2023   Degenerative spondylolisthesis 10/11/2022   History of DVT (deep vein thrombosis) 09/25/2022   Elevated blood sugar 08/09/2022   Gout involving toe 08/09/2022   Coronary artery disease of native artery of native heart with stable angina pectoris 07/26/2022   Class 2 severe obesity with serious comorbidity and body mass index (BMI) of 38.0 to 38.9 in adult 07/26/2022   Pain in left lower leg 07/26/2022   Lower limb pain, inferior, left 05/30/2022   Open wound of left lower leg due to cat bite 04/24/2022   Injury of left lower leg 04/01/2022   Osteoarthritis of left knee 10/05/2021   Pain in joint of left knee 10/05/2021   Personal history of kidney stones 12/25/2019   Choroidal nevus, 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   CME (cystoid macular edema), left 11/20/2018   Nonexudative age-related macular degeneration, bilateral, early dry stage 11/20/2018   Pseudophakia of both eyes 11/20/2018   Olecranon bursitis 10/16/2018   Contusion of knee 10/16/2018   Arthritis of knee 10/16/2018  Arthritis of hand 10/16/2018   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/10/2018   Obesity 09/11/2017   Abdominal pain 02/28/2017   History of renal cell carcinoma 02/20/2017   Knee pain 08/14/2016   Strain of hamstring muscle 08/08/2016   Left leg swelling 08/22/2015   Systolic murmur    Retrosternal chest pain 07/08/2015   History of colonic polyps 04/16/2015   Chest pain 03/08/2011   Hyperlipidemia 03/08/2011   HTN (hypertension) 03/08/2011   PCP:  Edman Marsa PARAS, DO Pharmacy:   [facility] [address] ([address]), [place] - [address] [place] ([place]) [place] [address] Phone: [phone] Fax: [phone]  [facility] - [place], [place] - [address] [place]  [address] Phone: [phone] Fax: [phone]  [facility] - [place], [place] - [address] [place] [address] Phone: [phone] Fax: [phone]  [facility]) [phone] - [place], [place] - [address] AT [facility] [address] [place] [address] Phone: [phone] Fax: [phone]     Social Drivers of Health (SDOH) Social History: SDOH Screenings   Food Insecurity: No Food Insecurity (08/25/2024)  Housing: Low Risk  (08/25/2024)  Transportation Needs: No Transportation Needs (08/25/2024)  Utilities: Not At Risk (08/25/2024)  Alcohol Screen: Low Risk  (02/27/2023)  Depression (PHQ2-9): Low Risk  (08/21/2024)  Financial Resource Strain: Patient Unable To Answer (07/04/2023)   Received from [facility]  Social Connections: Patient Declined (08/25/2024)  Stress: No Stress Concern Present (09/04/2023)   Received from [facility]  Recent Concern: Stress - Stress Concern Present (07/02/2023)   Received from [facility]  Tobacco Use: Low Risk  (08/25/2024)   SDOH Interventions:     Readmission Risk Interventions     No data to display

## 2024-08-27 ENCOUNTER — Telehealth: Payer: Self-pay | Admitting: *Deleted

## 2024-08-27 NOTE — Transitions of Care (Post Inpatient/ED Visit) (Signed)
 08/27/2024  Name: Carrie Larsen MRN: [id number] DOB: 11-30-1945  Today's TOC FU Call Status: Today's TOC FU Call Status:: Successful TOC FU Call Completed TOC FU Call Complete Date: 08/27/24 Patient's Name and Date of Birth confirmed.  Transition Care Management Follow-up Telephone Call Date of Discharge: 08/26/24 Discharge Facility: [facility]) Type of Discharge: Inpatient Admission Primary Inpatient Discharge Diagnosis:: Community acquired pneumonia How have you been since you were released from the hospital?: Same Any questions or concerns?: No  Items Reviewed: Did you receive and understand the discharge instructions provided?:  (Patient unsure if she received discharge instructions-RNCM reviewed) Medications obtained,verified, and reconciled?: Yes (Medications Reviewed) Any new allergies since your discharge?: No Dietary orders reviewed?: Yes Type of Diet Ordered:: low sodium heart healthy Do you have support at home?: Yes People in Home [RPT]: friend(s) Name of Support/Comfort Primary Source: Caregiver/Christie  Medications Reviewed Today: Medications Reviewed Today     Reviewed by Lucky Andrea LABOR, RN (Registered Nurse) on 08/27/24 at 1054  Med List Status: <None>   Medication Order Taking? Sig Documenting Provider Last Dose Status Informant  acetaminophen  (TYLENOL ) 500 MG tablet [id number]  Take 500 mg by mouth 2 (two) times daily. Give 1 tablet by mouth 2 times a day for back pain  Patient not taking: Reported on 08/27/2024   [provider]  Active Nursing Home Medication Administration Guide (MAG)  apixaban  (ELIQUIS ) 2.5 MG TABS tablet [id number] Yes Take 1 tablet (2.5 mg total) by mouth 2 (two) times daily. Edman Marsa PARAS, DO  Active   atorvastatin  (LIPITOR ) 80 MG tablet [id number] Yes Take 1 tablet (80 mg total) by mouth daily. Edman Marsa PARAS, DO  Active   busPIRone  (BUSPAR ) 5 MG tablet [id number] Yes Take 1  tablet (5 mg total) by mouth 2 (two) times daily. Edman Marsa PARAS, DO  Active   Cranberry 450 MG CAPS [id number] Yes Take 2 capsules by mouth daily. Give 2 capsules by mouth one time a day for urinary tract health [provider]  Active Nursing Home Medication Administration Guide (MAG)  diphenoxylate-atropine (LOMOTIL) 2.5-0.025 MG tablet [id number] Yes Take 1 tablet by mouth 4 (four) times daily as needed for diarrhea or loose stools. Take only if imodium  is ineffective Jhonny Sahara B, MD  Active   lamoTRIgine  (LAMICTAL ) 150 MG tablet [id number] Yes Take 1 tablet (150 mg total) by mouth daily. Edman Marsa PARAS, DO  Active   loperamide  (IMODIUM ) 2 MG capsule [id number] Yes Take 1 capsule (2 mg total) by mouth as needed for diarrhea or loose stools. Jhonny Sahara NOVAK, MD  Active   ondansetron  (ZOFRAN ) 4 MG tablet [id number]  Take 4 mg by mouth every 6 (six) hours as needed for nausea or vomiting. Give 1 tablet by mouth every 6 hours as needed for Nausea and Vomiting  Patient not taking: Reported on 08/27/2024   [provider]  Active Nursing Home Medication Administration Guide (MAG)           Med Note CATHY OVAL DEL   Wed Aug 26, 2024 10:59 AM)    pantoprazole  (PROTONIX ) 40 MG tablet [id number] Yes Take 1 tablet (40 mg total) by mouth daily before breakfast. Edman Marsa PARAS, DO  Active             Home Care and Equipment/Supplies: Were Home Health Services Ordered?: Yes Name of Home Health Agency:: [facility] to resume PT/OT Has Agency set up a time to come to your home?: Yes First  Home Health Visit Date: 08/31/24 (delay due to patient +COVID) Any new equipment or medical supplies ordered?: No  Functional Questionnaire: Do you need assistance with bathing/showering or dressing?: No Do you need assistance with meal preparation?: No Do you need assistance with eating?: No Do you have difficulty maintaining continence: No Do you need  assistance with getting out of bed/getting out of a chair/moving?: No Do you have difficulty managing or taking your medications?: No  Follow up appointments reviewed: PCP Follow-up appointment confirmed?: Yes Date of PCP follow-up appointment?: 09/08/24 Follow-up Provider: Dr. Edman Specialist [facility] Follow-up appointment confirmed?: NA Do you need transportation to your follow-up appointment?: No (Patient has a friend that will take her to appointments) Do you understand care options if your condition(s) worsen?: Yes-patient verbalized understanding  SDOH Interventions Today    Flowsheet Row Most Recent Value  SDOH Interventions   Food Insecurity Interventions Intervention Not Indicated  Housing Interventions Intervention Not Indicated  Transportation Interventions Intervention Not Indicated  Utilities Interventions Intervention Not Indicated    Andrea Dimes RN, BSN [place]  Value-Based Care [facility] RN Care Manager [phone]

## 2024-08-29 LAB — CULTURE, BLOOD (ROUTINE X 2)
Culture: NO GROWTH
Culture: NO GROWTH

## 2024-08-31 ENCOUNTER — Ambulatory Visit: Admitting: Family Medicine

## 2024-08-31 ENCOUNTER — Telehealth: Payer: Self-pay

## 2024-08-31 NOTE — Telephone Encounter (Signed)
 Copied from CRM [id number]. Topic: Clinical - Home Health Verbal Orders >> Aug 31, 2024  1:51 PM Amy B wrote: Caller/Agency: [facility] Callback Number: [phone] Service Requested: Skilled Nursing Frequency: 2 week one and 1 week 3 Any new concerns about the patient? No

## 2024-08-31 NOTE — Telephone Encounter (Signed)
 Okay to proceed with verbal orders  Marsa Officer, DO [facility] 08/31/2024, 2:21 PM

## 2024-08-31 NOTE — Telephone Encounter (Signed)
 Verbal orders given to [facility].

## 2024-09-03 ENCOUNTER — Telehealth: Payer: Self-pay

## 2024-09-03 ENCOUNTER — Other Ambulatory Visit: Payer: Self-pay | Admitting: *Deleted

## 2024-09-03 ENCOUNTER — Encounter: Payer: Self-pay | Admitting: *Deleted

## 2024-09-03 NOTE — Transitions of Care (Post Inpatient/ED Visit) (Signed)
 Transition of Care week 2  Visit Note  09/03/2024  Name: Carrie Larsen MRN: [id number]          DOB: 10-04-1946  Situation: Patient enrolled in [facility] 30-day program. Visit completed with Ms. Regas by telephone.   Background:   Initial Transition Care Management Follow-up Telephone Call    Past Medical History:  Diagnosis Date   Anxiety    Arthritis    osteoarthritis   CAD (coronary artery disease)    a. 2012 ETT: no ischemia;  b. 06/2017 NSTEMI/PCI: LM nl, LAD nl, D1 70-80p, LCX nl, OM1 90 (3.0 x 23 Xience [place]), RCA dominant, 100p/m, fills via L->R collats, EF 55-65%.   Cancer [facility])    a. s/p partial left nephrectomy.   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 colon    Colon polyp 2011   COVID-19 07/2019   Diastolic dysfunction    a. 08/2015 Echo: EF 60-65%, no rwma, Gr1 DD, midlly dil LA, PASP . No significant valvular dzs; b. 06/2017 Echo: EF 60-65%, Gr1DD, mildly dil LA.   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   Obesity    Prediabetes    Pseudophakia of both eyes    Retinal cyst     Assessment: Patient Reported Symptoms: Cognitive Cognitive Status: Able to follow simple commands, Alert and oriented to person, place, and time, Normal speech and language skills      Neurological Neurological Review of Symptoms: No symptoms reported    HEENT HEENT Symptoms Reported: No symptoms reported      Cardiovascular Cardiovascular Symptoms Reported: No symptoms reported    Respiratory Respiratory Symptoms Reported: No symptoms reported Additional Respiratory Details: Patient denies  COVID symptoms Respiratory Management Strategies: Routine screening  Endocrine Endocrine Symptoms Reported: Not assessed    Gastrointestinal Additional Gastrointestinal Details: Loose stool x 2wks. Scheduled with PCP on 09/04/24. Patient would like a referral to GI. Hx stool leakage related to constipation Gastrointestinal Self-Management Outcome: 3 (uncertain) Gastrointestinal Comment: RNCM discussed reasons to seek medical care prior to her appointment on 09/04/24, including:intense abdominal pain, fever or bleeding.    Genitourinary Genitourinary Symptoms Reported: No symptoms reported    Integumentary Integumentary Symptoms Reported: No symptoms reported    Musculoskeletal Musculoskelatal Symptoms Reviewed: Weakness Additional Musculoskeletal Details: ambulates with walker Musculoskeletal Management Strategies: Exercise, Routine screening, Coping strategies Musculoskeletal Self-Management Outcome: 4 (good) Musculoskeletal Comment: HH PT twice weekly Falls in the past year?: No    Psychosocial Psychosocial Symptoms Reported: Not assessed         There were no vitals filed for this visit.  Medications Reviewed Today     Reviewed by Lucky Andrea LABOR, RN (Registered Nurse) on 09/03/24 at 1449  Med List Status: <None>   Medication Order Taking? Sig Documenting Provider Last Dose Status Informant  acetaminophen  (TYLENOL ) 500 MG tablet [id number] Yes Take 500 mg by mouth 2 (two) times daily. Give 1 tablet by mouth 2 times a day for back pain [provider]  Active Nursing Home Medication Administration Guide (MAG)  apixaban  (  ELIQUIS ) 2.5 MG TABS tablet [id number] Yes Take 1 tablet (2.5 mg total) by mouth 2 (two) times daily. Edman Marsa PARAS, DO  Active   atorvastatin  (LIPITOR ) 80 MG tablet [id number] Yes Take 1 tablet (80 mg total) by mouth daily. Edman Marsa PARAS, DO  Active   busPIRone  (BUSPAR ) 5 MG tablet [id number] Yes Take 1 tablet (5 mg total) by mouth 2  (two) times daily. Edman Marsa PARAS, DO  Active   Cranberry 450 MG CAPS [id number]  Take 2 capsules by mouth daily. Give 2 capsules by mouth one time a day for urinary tract health  Patient not taking: Reported on 09/03/2024   [provider]  Active Nursing Home Medication Administration Guide (MAG)  diphenoxylate-atropine (LOMOTIL) 2.5-0.025 MG tablet [id number]  Take 1 tablet by mouth 4 (four) times daily as needed for diarrhea or loose stools. Take only if imodium  is ineffective  Patient not taking: Reported on 09/03/2024   Jhonny Calvin NOVAK, MD  Active   lamoTRIgine  (LAMICTAL ) 150 MG tablet [id number] Yes Take 1 tablet (150 mg total) by mouth daily. Edman Marsa PARAS, DO  Active   loperamide  (IMODIUM ) 2 MG capsule [id number]  Take 1 capsule (2 mg total) by mouth as needed for diarrhea or loose stools.  Patient not taking: Reported on 09/03/2024   Jhonny Calvin NOVAK, MD  Active   ondansetron  (ZOFRAN ) 4 MG tablet [id number]  Take 4 mg by mouth every 6 (six) hours as needed for nausea or vomiting. Give 1 tablet by mouth every 6 hours as needed for Nausea and Vomiting  Patient not taking: Reported on 09/03/2024   [provider]  Active Nursing Home Medication Administration Guide (MAG)           Med Note CATHY OVAL DEL   Wed Aug 26, 2024 10:59 AM)    pantoprazole  (PROTONIX ) 40 MG tablet [id number] Yes Take 1 tablet (40 mg total) by mouth daily before breakfast. Edman Marsa PARAS, DO  Active             Recommendation:   Continue Current Plan of Care  Follow Up Plan:   Telephone follow-up in 1 week  Andrea Dimes RN, BSN [place]  Value-Based Care [facility] RN Care Manager [phone]
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
## 2024-09-03 NOTE — Patient Instructions (Signed)
 Visit Information  Thank you for taking time to visit with me today. Please don't hesitate to contact me if I can be of assistance to you before our next scheduled telephone appointment.   Following is a copy of your care plan:   Goals Addressed             This Visit's Progress    VBCI Transitions of Care (TOC) Care Plan       Problems:  Recent Hospitalization for treatment of Pneumonia and +COVID  Goal:  Over the next 30 days, the patient will not experience hospital readmission  Interventions:  Transitions of Care: Doctor Visits  - discussed the importance of doctor visits Post discharge activity limitations prescribed by provider reviewed Reviewed diet and exercise Discussed reasons to contact provider Medications reviewed in detail Verified [facility] PT has resumed for twice weekly visits Reviewed upcoming appointments including:09/04/24 with PCP for acute visit, 09/08/24 with PCP for Hospital Follow up and 09/09/24 with Urology Verified patient has transportation to upcoming appointments    Patient Self Care Activities:  Attend all scheduled provider appointments Call provider office for new concerns or questions  Notify RN Care Manager of [facility] call rescheduling needs Participate in Transition of Care Program/Attend [facility] scheduled calls Take medications as prescribed    Plan:  Telephone follow up appointment with care management team member scheduled for:  09/10/24 at 3 pm with Shona, RN        Patient verbalizes understanding of instructions and care plan provided today and agrees to view in MyChart. Active MyChart status and patient understanding of how to access instructions and care plan via MyChart confirmed with patient.     Telephone follow up appointment with care management team member scheduled for:09/10/24 at 3pm with Shona, RN.  Please call the care guide team at [phone] if you need to cancel or reschedule your appointment.   Please call  1-800-273-TALK (toll free, 24 hour hotline) call 911 if you are experiencing a Mental Health or Behavioral Health Crisis or need someone to talk to.  Andrea Dimes RN, BSN [place]  Value-Based Care [facility] Health RN Care Manager [phone]

## 2024-09-04 ENCOUNTER — Ambulatory Visit

## 2024-09-07 ENCOUNTER — Telehealth: Payer: Self-pay

## 2024-09-07 NOTE — Telephone Encounter (Signed)
 Left message for patient to return call OK to give verbal orders    VM didn't say if secure

## 2024-09-07 NOTE — Telephone Encounter (Signed)
 Okay to proceed w/ verbal orders  Marsa Officer, DO [facility] 09/07/2024, 3:07 PM

## 2024-09-07 NOTE — Telephone Encounter (Signed)
 Copied from CRM [id number]. Topic: Clinical - Home Health Verbal Orders >> Sep 07, 2024  1:56 PM Amy B wrote: Caller/Agency: [facility] Number: [phone] Service Requested: Occupational Therapy, Physical Therapy, Skilled Nursing Frequency: Once per week for 8 weeks Any new concerns about the patient? No

## 2024-09-08 ENCOUNTER — Emergency Department
Admission: EM | Admit: 2024-09-08 | Discharge: 2024-09-10 | Disposition: A | Discharge status: Home / Self Care | Attending: Emergency Medicine | Admitting: Emergency Medicine

## 2024-09-08 ENCOUNTER — Inpatient Hospital Stay: Admitting: Family Medicine

## 2024-09-08 ENCOUNTER — Other Ambulatory Visit: Payer: Self-pay

## 2024-09-08 ENCOUNTER — Emergency Department

## 2024-09-08 ENCOUNTER — Telehealth: Payer: Self-pay

## 2024-09-08 DIAGNOSIS — I2510 Atherosclerotic heart disease of native coronary artery without angina pectoris: Secondary | ICD-10-CM | POA: Diagnosis not present

## 2024-09-08 DIAGNOSIS — R197 Diarrhea, unspecified: Secondary | ICD-10-CM | POA: Diagnosis present

## 2024-09-08 DIAGNOSIS — Z85038 Personal history of other malignant neoplasm of large intestine: Secondary | ICD-10-CM | POA: Insufficient documentation

## 2024-09-08 DIAGNOSIS — K6289 Other specified diseases of anus and rectum: Secondary | ICD-10-CM | POA: Diagnosis not present

## 2024-09-08 DIAGNOSIS — Z7901 Long term (current) use of anticoagulants: Secondary | ICD-10-CM | POA: Insufficient documentation

## 2024-09-08 DIAGNOSIS — R627 Adult failure to thrive: Secondary | ICD-10-CM

## 2024-09-08 DIAGNOSIS — I10 Essential (primary) hypertension: Secondary | ICD-10-CM | POA: Insufficient documentation

## 2024-09-08 DIAGNOSIS — M6281 Muscle weakness (generalized): Secondary | ICD-10-CM | POA: Insufficient documentation

## 2024-09-08 LAB — COMPREHENSIVE METABOLIC PANEL WITH GFR
ALT: 13 U/L (ref 0–44)
AST: 21 U/L (ref 15–41)
Albumin: 3.7 g/dL (ref 3.5–5.0)
Alkaline Phosphatase: 110 U/L (ref 38–126)
Anion gap: 10 (ref 5–15)
BUN: 20 mg/dL (ref 8–23)
CO2: 27 mmol/L (ref 22–32)
Calcium: 9.7 mg/dL (ref 8.9–10.3)
Chloride: 107 mmol/L (ref 98–111)
Creatinine, Ser: 1.42 mg/dL — ABNORMAL HIGH (ref 0.44–1.00)
GFR, Estimated: 38 mL/min — ABNORMAL LOW (ref >60)
Glucose, Bld: 86 mg/dL (ref 70–99)
Potassium: 4.9 mmol/L (ref 3.5–5.1)
Sodium: 144 mmol/L (ref 135–145)
Total Bilirubin: 0.5 mg/dL (ref 0.0–1.2)
Total Protein: 6.6 g/dL (ref 6.5–8.1)

## 2024-09-08 LAB — URINALYSIS, ROUTINE W REFLEX MICROSCOPIC
Bilirubin Urine: NEGATIVE
Glucose, UA: NEGATIVE mg/dL
Hgb urine dipstick: NEGATIVE
Ketones, ur: NEGATIVE mg/dL
Leukocytes,Ua: NEGATIVE
Nitrite: NEGATIVE
Protein, ur: NEGATIVE mg/dL
Specific Gravity, Urine: 1.014 (ref 1.005–1.030)
pH: 7 (ref 5.0–8.0)

## 2024-09-08 LAB — CBC
HCT: 34.6 % — ABNORMAL LOW (ref 36.0–46.0)
Hemoglobin: 10.9 g/dL — ABNORMAL LOW (ref 12.0–15.0)
MCH: 28.2 pg (ref 26.0–34.0)
MCHC: 31.5 g/dL (ref 30.0–36.0)
MCV: 89.4 fL (ref 80.0–100.0)
Platelets: 294 K/uL (ref 150–400)
RBC: 3.87 MIL/uL (ref 3.87–5.11)
RDW: 13.6 % (ref 11.5–15.5)
WBC: 7 K/uL (ref 4.0–10.5)
nRBC: 0 % (ref 0.0–0.2)

## 2024-09-08 LAB — LIPASE, BLOOD: Lipase: 28 U/L (ref 11–51)

## 2024-09-08 MED ORDER — DIPHENOXYLATE-ATROPINE 2.5-0.025 MG PO TABS
1.0000 | ORAL_TABLET | Freq: Four times a day (QID) | ORAL | Status: DC
Start: 2024-09-08 — End: 2024-09-10
  Administered 2024-09-09 – 2024-09-10 (×3): 1 via ORAL
  Filled 2024-09-08 (×4): qty 1

## 2024-09-08 MED ORDER — AMOXICILLIN-POT CLAVULANATE 875-125 MG PO TABS
1.0000 | ORAL_TABLET | Freq: Once | ORAL | Status: AC
  Administered 2024-09-09: 1 via ORAL

## 2024-09-08 MED ORDER — AMOXICILLIN-POT CLAVULANATE 875-125 MG PO TABS
1.0000 | ORAL_TABLET | Freq: Two times a day (BID) | ORAL | 0 refills | Status: AC
Start: 2024-09-08 — End: 2024-09-15

## 2024-09-08 MED ORDER — IOHEXOL 300 MG/ML  SOLN
80.0000 mL | Freq: Once | INTRAMUSCULAR | Status: AC | PRN
  Administered 2024-09-08: 80 mL via INTRAVENOUS

## 2024-09-08 MED ORDER — DIPHENOXYLATE-ATROPINE 2.5-0.025 MG PO TABS
1.0000 | ORAL_TABLET | Freq: Once | ORAL | Status: AC
  Administered 2024-09-09: 1 via ORAL

## 2024-09-08 MED ORDER — DIPHENOXYLATE-ATROPINE 2.5-0.025 MG PO TABS
1.0000 | ORAL_TABLET | Freq: Four times a day (QID) | ORAL | 0 refills | Status: DC | PRN
Start: 2024-09-08 — End: 2024-10-21

## 2024-09-08 MED ORDER — AMOXICILLIN-POT CLAVULANATE 875-125 MG PO TABS
1.0000 | ORAL_TABLET | Freq: Two times a day (BID) | ORAL | Status: DC
  Administered 2024-09-09 – 2024-09-10 (×3): 1 via ORAL
  Filled 2024-09-08 (×4): qty 1

## 2024-09-08 MED ORDER — SODIUM CHLORIDE 0.9 % IV BOLUS
500.0000 mL | Freq: Once | INTRAVENOUS | Status: AC
  Administered 2024-09-08: 500 mL via INTRAVENOUS
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
## 2024-09-08 NOTE — ED Notes (Addendum)
 This RN and Lexi RN at bedside. Pt noted to have soiled brief with urine and small solid bowel movement. Pt readjusted in bed. Pt cleaned with bath wipes and barrier cream applied to pts buttocks. New brief applied to pt, chux sheet, and draw sheet applied underneath pt. Pt appreciative and given warm blankets.

## 2024-09-08 NOTE — Telephone Encounter (Signed)
 Spoke with [employer] order given

## 2024-09-08 NOTE — Telephone Encounter (Signed)
 Copied from CRM [id number]. Topic: General - Other >> Sep 08, 2024 10:08 AM Myrick T wrote: Reason for CRM: Rhea from [facility] returning CAL call. Please f/u with patient

## 2024-09-08 NOTE — ED Triage Notes (Signed)
 First Nurse Note: Patient to ED via [facility] from home for diarrhea (x2 weeks) and nausea. Pt also having dysuria. VS WNL
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
## 2024-09-08 NOTE — ED Notes (Signed)
 [employer] Supervisor Will sts since pt isn't bed bound and lives alone she doesn't qualify for medical need for transport. Pt has to pay upfront.   Patient made aware and called her POA who writer spoke with. Per Bari, pt lives at home alone, is wheelchair bound and has difficulty with ADL's since recent weakness and hospitalizations.    EDP made aware of pts need for social work and PT eval prior to safe home discharge.

## 2024-09-08 NOTE — Discharge Instructions (Addendum)
 Based on your CT scan, there is some inflammation in your rectum.  We have started you on an antibiotic.  We have also represcribed the Lomotil for diarrhea.  Follow-up with GI next week as scheduled.  In the meantime, return to the ER for new, worsening, or persistent severe diarrhea, abdominal pain, vomiting, fever, blood in your stool, or any other new or worsening symptoms that concern you.

## 2024-09-08 NOTE — ED Provider Notes (Signed)
 [facility] Provider Note    Event Date/Time   First MD Initiated Contact with Patient 09/08/24 1510     (approximate)   History   Diarrhea   HPI  Carrie Larsen is a 78 y.o. female with a history of hyperlipidemia, CAD, hypertension, colon cancer, GERD, DVT on Eliquis , seizure disorder who presents with diarrhea for the last several weeks.  The patient states that she was admitted recently with diarrhea as well as some cough and respiratory symptoms she was diagnosed with pneumonia and treated for that.  She states that at the time of discharge she was given loperamide  and Lomotil which helped for a while, however she ran out of them a few days ago and now over the last 3 days she has had recurrent diarrhea, 4-5 episodes per day, watery, nonbloody, associated with right sided abdominal pain.  She also reports urinary frequency and dysuria.  She has nausea but no vomiting.  I reviewed the past medical records.  The patient was admitted to the hospitalist service at the end of September with CAP and diarrhea.  Her most recent abdominal imaging in June showed likely stercoral colitis.   Physical Exam   Triage Vital Signs: ED Triage Vitals  Encounter Vitals Group     BP 09/08/24 1329 131/83     Girls Systolic BP Percentile --      Girls Diastolic BP Percentile --      Boys Systolic BP Percentile --      Boys Diastolic BP Percentile --      Pulse Rate 09/08/24 1329 84     Resp 09/08/24 1329 18     Temp 09/08/24 1329 98.1 F (36.7 C)     Temp Source 09/08/24 1329 Oral     SpO2 09/08/24 1329 98 %     Weight --      Height --      Head Circumference --      Peak Flow --      Pain Score 09/08/24 1330 8     Pain Loc --      Pain Education --      Exclude from Growth Chart --     Most recent vital signs: Vitals:   09/08/24 1800 09/08/24 1842  BP: 124/65   Pulse: 65   Resp: 18   Temp:  98.1 F (36.7 C)  SpO2: 100%      General: Alert,  relatively well-appearing, no distress.  CV:  Good peripheral perfusion.  Resp:  Normal effort.  Abd:  Soft with mild right lower quadrant tenderness.  No distention.  Other:  , Dry mucous membranes.  No jaundice or scleral icterus.   ED Results / Procedures / Treatments   Labs (all labs ordered are listed, but only abnormal results are displayed) Labs Reviewed  COMPREHENSIVE METABOLIC PANEL WITH GFR - Abnormal; Notable for the following components:      Result Value   Creatinine, Ser 1.42 (*)    GFR, Estimated 38 (*)    All other components within normal limits  CBC - Abnormal; Notable for the following components:   Hemoglobin 10.9 (*)    HCT 34.6 (*)    All other components within normal limits  URINALYSIS, ROUTINE W REFLEX MICROSCOPIC - Abnormal; Notable for the following components:   Color, Urine YELLOW (*)    APPearance CLEAR (*)    All other components within normal limits  GASTROINTESTINAL PANEL BY PCR, STOOL (REPLACES STOOL  CULTURE)  C DIFFICILE QUICK SCREEN W PCR REFLEX    LIPASE, BLOOD     EKG     RADIOLOGY  CT abdomen/pelvis: I independently viewed and interpreted the images; there are no dilated bowel loops or any free air or free fluid.  Radiology report indicates the following:  IMPRESSION:  1. Diffuse rectal wall thickening with presacral edema, likely related to fecal  impaction/constipation.  2. Unchanged Large left lateral abdominal wall hernia containing a loop of  nondilated small bowel, without obstruction.    PROCEDURES:  Critical Care performed: No  Procedures   MEDICATIONS ORDERED IN ED: Medications  amoxicillin-clavulanate (AUGMENTIN) 875-125 MG per tablet 1 tablet (has no administration in time range)  diphenoxylate-atropine (LOMOTIL) 2.5-0.025 MG per tablet 1 tablet (has no administration in time range)  sodium chloride  0.9 % bolus 500 mL (0 mLs Intravenous Stopped 09/08/24 1803)  iohexol  (OMNIPAQUE ) 300 MG/ML solution 80 mL (80  mLs Intravenous Contrast Given 09/08/24 1641)     IMPRESSION / MDM / ASSESSMENT AND PLAN / ED COURSE  I reviewed the triage vital signs and the nursing notes.  78 year old female with PMH as noted above presents with diarrhea for the last few weeks, improved with medication but now worsened in the last several days.  She also has dysuria and some right lower quadrant abdominal pain, with mild tenderness on exam.  Differential diagnosis includes, but is not limited to, diverticulitis, colitis, gastroenteritis, IBD, UTI, pyelonephritis.  We will obtain lab workup, CT abdomen/pelvis, give a fluid bolus, and reassess.  Patient's presentation is most consistent with acute complicated illness / injury requiring diagnostic workup.  ----------------------------------------- 7:15 PM on 09/08/2024 -----------------------------------------  Lab workup is reassuring.  CBC shows no leukocytosis.  CMP shows no acute abnormalities.  Lipase is normal.  Urinalysis is clear.  The CT shows some rectal wall thickening which the radiology report indicates could be related to fecal impaction or constipation.  However the patient is having diarrhea rather than constipation.  I performed a DRE which revealed no impacted stool although the patient is having some tenderness.  Overall presentation with the CT finding is most consistent with proctitis.  The patient is feeling relatively well and has been tolerating p.o.  She has not required any pain medication while in the ED.  She feels comfortable going home.  She is stable for discharge at this time.  I will treat with a course of Augmentin and restart her on the Lomotil.  She already has GI follow-up scheduled for next week.  I counseled her on the results of the workup and plan of care.  I gave strict return precautions, and she expressed understanding.   FINAL CLINICAL IMPRESSION(S) / ED DIAGNOSES   Final diagnoses:  Proctitis     Rx / DC Orders   ED  Discharge Orders          Ordered    amoxicillin-clavulanate (AUGMENTIN) 875-125 MG tablet  2 times daily        09/08/24 1913    diphenoxylate-atropine (LOMOTIL) 2.5-0.025 MG tablet  4 times daily PRN        09/08/24 1913             Note:  This document was prepared using Dragon voice recognition software and may include unintentional dictation errors.    Jacolyn Pae, MD 09/08/24 1932

## 2024-09-09 ENCOUNTER — Ambulatory Visit: Admitting: Urology

## 2024-09-09 DIAGNOSIS — K6289 Other specified diseases of anus and rectum: Secondary | ICD-10-CM | POA: Diagnosis not present

## 2024-09-09 MED ORDER — PANTOPRAZOLE SODIUM 40 MG PO TBEC
40.0000 mg | DELAYED_RELEASE_TABLET | Freq: Every day | ORAL | Status: DC
  Administered 2024-09-09 – 2024-09-10 (×2): 40 mg via ORAL
  Filled 2024-09-09 (×2): qty 1

## 2024-09-09 MED ORDER — ATORVASTATIN CALCIUM 20 MG PO TABS
80.0000 mg | ORAL_TABLET | Freq: Every day | ORAL | Status: DC
  Administered 2024-09-09 – 2024-09-10 (×2): 80 mg via ORAL
  Filled 2024-09-09 (×2): qty 4

## 2024-09-09 MED ORDER — BUSPIRONE HCL 5 MG PO TABS
5.0000 mg | ORAL_TABLET | Freq: Two times a day (BID) | ORAL | Status: DC
  Administered 2024-09-09 – 2024-09-10 (×4): 5 mg via ORAL
  Filled 2024-09-09 (×4): qty 1

## 2024-09-09 MED ORDER — APIXABAN 2.5 MG PO TABS
2.5000 mg | ORAL_TABLET | Freq: Two times a day (BID) | ORAL | Status: DC
Start: 2024-09-09 — End: 2024-09-10
  Administered 2024-09-09 – 2024-09-10 (×4): 2.5 mg via ORAL
  Filled 2024-09-09 (×4): qty 1

## 2024-09-09 NOTE — ED Notes (Signed)
 This tech and Con EDT changed pt after urinary and bowel incontinence. Pt's brief and chux pad were changed. Peri care was performed. Pt was readjusted in bed. No other needs verbalized at this time.

## 2024-09-09 NOTE — Evaluation (Signed)
 Occupational Therapy Evaluation Patient Details Name: Carrie Larsen MRN: [id number] DOB: [birth year] Today's Date: 09/09/2024   History of Present Illness   78 y.o. female with a history of hyperlipidemia, CAD, hypertension, colon cancer, GERD, DVT on Eliquis , seizure disorder who presents with diarrhea for the last several weeks.  The patient states that she was admitted recently with diarrhea as well as some cough and respiratory symptoms she was diagnosed with pneumonia and treated for that.  She states that at the time of discharge she was given loperamide  and Lomotil which helped for a while, however she ran out of them a few days ago and now over the last 3 days she has had recurrent diarrhea, 4-5 episodes per day, watery, nonbloody, associated with right sided abdominal pain     Clinical Impressions Patient presenting with decreased Ind in self care,balance,functional mobility/transfers, endurance, and safety awareness. Patient reports being mod I at baseline and living at home alone with use of manual wheelchair for most mobility. She can also use RW to stand and transfer if needed. Pt performs most self care tasks from hospital bed. Once in room, pt having had BM and NT present to assist. Pt demonstrates ability to perform her own hygiene from bed level. Pt dons B socks independently and rolls without assistance to EOB. Pt does need min A to comes to sitting as pt has hospital rail she uses and is currently in ED stretcher. Pt demonstrates ability to stand with therapist with min guard and takes side steps. She is likely very close to baseline at this time. She has friends that will assist as needed at discharge.  Patient will benefit from acute OT to increase overall independence in the areas of ADLs, functional mobility, and safety awareness in order to safely discharge.     If plan is discharge home, recommend the following:   Assist for transportation     Functional Status  Assessment   Patient has not had a recent decline in their functional status     Equipment Recommendations   None recommended by OT      Precautions/Restrictions   Precautions Precautions: Fall     Mobility Bed Mobility Overal bed mobility: Needs Assistance Bed Mobility: Supine to Sit, Sit to Supine     Supine to sit: Supervision Sit to supine: Supervision   General bed mobility comments: Pt rolls and sits up with effort from ED stretcher she uses hospital bed rails at home    Transfers Overall transfer level: Needs assistance Equipment used: 1 person hand held assist Transfers: Sit to/from Stand Sit to Stand: Contact guard assist           General transfer comment: uses RW at home      Balance Overall balance assessment: Needs assistance Sitting-balance support: Feet supported Sitting balance-Leahy Scale: Normal     Standing balance support: Bilateral upper extremity supported Standing balance-Leahy Scale: Fair                             ADL either performed or assessed with clinical judgement   ADL Overall ADL's : Needs assistance/impaired;At baseline                                       General ADL Comments: very close to baseline. Pt dons B socks at bed level without assistance.  Vision Patient Visual Report: No change from baseline              Pertinent Vitals/Pain Pain Assessment Pain Assessment: No/denies pain     Extremity/Trunk Assessment Upper Extremity Assessment Upper Extremity Assessment: Overall WFL for tasks assessed;Generalized weakness           Communication Communication Communication: No apparent difficulties   Cognition Arousal: Alert Behavior During Therapy: WFL for tasks assessed/performed Cognition: No apparent impairments                               Following commands: Intact       Cueing  General Comments   Cueing Techniques: Verbal cues               Home Living Family/patient expects to be discharged to:: Private residence Living Arrangements: Alone Available Help at Discharge: Neighbor;Available PRN/intermittently;Friend(s) Type of Home: Mobile home Home Access: Ramped entrance     Home Layout: One level     Bathroom Shower/Tub: Sponge bathes at baseline   Bathroom Toilet: Standard     Home Equipment: Wheelchair - Conservation officer, historic buildings (2 wheels);Tub bench;Grab bars - toilet;Grab bars - tub/shower;Hand held shower head          Prior Functioning/Environment Prior Level of Function : Independent/Modified Independent             Mobility Comments: Pt can stand pivot and take steps with RW at home. She transfers to manual wheelchair and propels self in home. ADLs Comments: Self care from bed level and transfers self to toilet. Basin bath in hospital bed.    OT Problem List: Decreased strength;Decreased safety awareness;Decreased activity tolerance;Decreased knowledge of use of DME or AE   OT Treatment/Interventions: Self-care/ADL training;Therapeutic activities;Therapeutic exercise;Energy conservation;Patient/family education;Balance training      OT Goals(Current goals can be found in the care plan section)   Acute Rehab OT Goals Patient Stated Goal: to go home OT Goal Formulation: With patient Time For Goal Achievement: 09/23/24 Potential to Achieve Goals: Fair ADL Goals Pt Will Perform Grooming: with modified independence;sitting Pt Will Perform Lower Body Dressing: with modified independence;bed level Pt Will Transfer to Toilet: with modified independence;bedside commode Pt Will Perform Toileting - Clothing Manipulation and hygiene: with modified independence;sitting/lateral leans   OT Frequency:  Min 1X/week       AM-PAC OT 6 Clicks Daily Activity     Outcome Measure Help from another person eating meals?: None Help from another person taking care of personal grooming?:  None Help from another person toileting, which includes using toliet, bedpan, or urinal?: A Little Help from another person bathing (including washing, rinsing, drying)?: None Help from another person to put on and taking off regular upper body clothing?: None Help from another person to put on and taking off regular lower body clothing?: None 6 Click Score: 23   End of Session Nurse Communication: Mobility status  Activity Tolerance: Patient tolerated treatment well Patient left: in bed  OT Visit Diagnosis: Muscle weakness (generalized) (M62.81);Unsteadiness on feet (R26.81)                Time: 9154-9093 OT Time Calculation (min): 21 min Charges:  OT General Charges $OT Visit: 1 Visit OT Evaluation $OT Eval Moderate Complexity: 1 Mod OT Treatments $Self Care/Home Management : 8-22 mins  Izetta Claude, MS, OTR/L , CBIS ascom [phone]  09/09/24, 10:13 AM

## 2024-09-09 NOTE — Evaluation (Signed)
 Physical Therapy Evaluation Patient Details Name: Carrie Larsen MRN: [id number] DOB: 08/05/1946 Today's Date: 09/09/2024  History of Present Illness  78 y.o. female with a history of hyperlipidemia, CAD, hypertension, colon cancer, GERD, DVT on Eliquis , seizure disorder who presents with diarrhea for the last several weeks.  The patient states that she was admitted recently with diarrhea as well as some cough and respiratory symptoms she was diagnosed with pneumonia and treated for that.  She states that at the time of discharge she was given loperamide  and Lomotil which helped for a while, however she ran out of them a few days ago and now over the last 3 days she has had recurrent diarrhea, 4-5 episodes per day, watery, nonbloody, associated with right sided abdominal pain  Clinical Impression  Patient resting on stretcher upon arrival to ED room; alert and oriented, follows commands and agreeable to participation with treatment session.  Denies pain; does endorse some improvement in diarrhea/bowel control since arrival. Bilat UE/LE strength and ROM grossly symmetrical (except for noted L foot drop) for basic transfers and gait; no significant, focal weakness appreciated. Able to complete bed mobility with min assist (typically uses bedrail at home); sit/stand, basic transfers and gait (30') with RW, cga/close sup.  Demonstrates partially reciprocal stepping pattern with mild steppage gait/L foot drop; slow and deliberate stepping pattern with heavy WBing bilat UEs, but no overt buckling or LOB. Good RW position and control; very intentional with all movement. Would benefit from skilled PT to address above deficits and promote optimal return to PLOF.; recommend post-acute PT follow up as indicated by interdisciplinary care team.   '        If plan is discharge home, recommend the following: A little help with walking and/or transfers;A little help with bathing/dressing/bathroom   Can travel by  private vehicle        Equipment Recommendations  (has all equipment necessary)  Recommendations for Other Services       Functional Status Assessment       Precautions / Restrictions Precautions Precautions: Fall Restrictions Weight Bearing Restrictions Per Provider Order: No      Mobility  Bed Mobility Overal bed mobility: Needs Assistance Bed Mobility: Supine to Sit     Supine to sit: Contact guard, Min assist     General bed mobility comments: Pt rolls and sits up with effort from ED stretcher she uses hospital bed rails at home    Transfers Overall transfer level: Needs assistance Equipment used: Rolling walker (2 wheels) Transfers: Sit to/from Stand Sit to Stand: Contact guard assist           General transfer comment: heavy use of UEs to assist with lift off and standing balance    Ambulation/Gait Ambulation/Gait assistance: Contact guard assist Gait Distance (Feet): 30 Feet Assistive device: Rolling walker (2 wheels)         General Gait Details: partially reciprocal stepping pattern with mild steppage gait/L foot drop; slow and deliberate stepping pattern with heavy WBing bilat UEs, but no overt buckling or LOB.  Good RW position and control; very intentional with all movement  Stairs            Wheelchair Mobility     Tilt Bed    Modified Rankin (Stroke Patients Only)       Balance Overall balance assessment: Needs assistance Sitting-balance support: No upper extremity supported, Feet supported Sitting balance-Leahy Scale: Good     Standing balance support: Bilateral upper extremity supported  Standing balance-Leahy Scale: Fair                               Pertinent Vitals/Pain Pain Assessment Pain Assessment: No/denies pain    Home Living Family/patient expects to be discharged to:: Private residence Living Arrangements: Alone Available Help at Discharge: Neighbor;Available  PRN/intermittently;Friend(s) Type of Home: Mobile home Home Access: Ramped entrance       Home Layout: One level Home Equipment: Wheelchair - Conservation officer, historic buildings (2 wheels);Tub bench;Grab bars - toilet;Grab bars - tub/shower;Hand held shower head      Prior Function Prior Level of Function : Independent/Modified Independent             Mobility Comments: Pt can stand pivot and take steps (short-distances) with RW at home. She transfers to manual wheelchair and propels self in home. ADLs Comments: Self care from bed level and transfers self to toilet. Basin bath in hospital bed.     Extremity/Trunk Assessment   Upper Extremity Assessment Upper Extremity Assessment: Overall WFL for tasks assessed    Lower Extremity Assessment Lower Extremity Assessment: Overall WFL for tasks assessed (grossly at least 4-/5, except L ankle DF 2-/5)       Communication   Communication Communication: No apparent difficulties    Cognition Arousal: Alert Behavior During Therapy: WFL for tasks assessed/performed   PT - Cognitive impairments: No apparent impairments                         Following commands: Intact       Cueing Cueing Techniques: Verbal cues     General Comments      Exercises     Assessment/Plan    PT Assessment Patient needs continued PT services  PT Problem List Decreased strength;Decreased activity tolerance;Decreased balance;Decreased mobility;Decreased knowledge of use of DME;Decreased safety awareness       PT Treatment Interventions DME instruction;Gait training;Functional mobility training;Therapeutic activities;Therapeutic exercise;Balance training;Cognitive remediation;Patient/family education    PT Goals (Current goals can be found in the Care Plan section)  Acute Rehab PT Goals Patient Stated Goal: to keep getting stronger and to clear up the diarrhea PT Goal Formulation: With patient Time For Goal Achievement:  09/23/24 Potential to Achieve Goals: Good    Frequency Min 2X/week     Co-evaluation               AM-PAC PT 6 Clicks Mobility  Outcome Measure Help needed turning from your back to your side while in a flat bed without using bedrails?: None Help needed moving from lying on your back to sitting on the side of a flat bed without using bedrails?: None Help needed moving to and from a bed to a chair (including a wheelchair)?: None Help needed standing up from a chair using your arms (e.g., wheelchair or bedside chair)?: A Little Help needed to walk in hospital room?: A Little Help needed climbing 3-5 steps with a railing? : A Little 6 Click Score: 21    End of Session Equipment Utilized During Treatment: Gait belt Activity Tolerance: Patient tolerated treatment well Patient left: in bed;with call bell/phone within reach Nurse Communication: Mobility status PT Visit Diagnosis: Muscle weakness (generalized) (M62.81)    Time: 8851-8787 PT Time Calculation (min) (ACUTE ONLY): 24 min   Charges:   PT Evaluation $PT Eval Moderate Complexity: 1 Mod   PT General Charges $$ ACUTE PT VISIT: 1 Visit  Beverlyann Broxterman H. Delores, PT, DPT, NCS 09/09/24, 2:58 PM [phone]

## 2024-09-09 NOTE — ED Notes (Signed)
 Fall risk bundle in place.

## 2024-09-09 NOTE — ED Provider Notes (Signed)
-----------------------------------------   6:19 AM on 09/09/2024 -----------------------------------------   Blood pressure 124/65, pulse 65, temperature 97.9 F (36.6 C), resp. rate 18, SpO2 100%.  The patient is calm and cooperative at this time.  The patient had been discharged by her EDP, but unfortunately she lives alone and cannot care for herself.  She also has no way to get back home.  Given the concerns of her safety and inability to perform her ADLs, social work has been consulted as well as PT/OT and she will likely need placement.   Gordan Huxley, MD 09/09/24 [phone]

## 2024-09-09 NOTE — ED Notes (Signed)
 This RN at bedside. Pt reported that she needed to be cleaned again. Pt able to roll from side to side and lift leg independently. Pt noted to have moderate sized bowel movement in brief. This RN changed brief, replaced chux pad, clean with bath wipes and applied barrier cream. Pt appreciative. Pt also given night time meds. Pillow placed underneath pts feet and behind head. Pt given warm blankets, jug of ice water, and graham crackers upon request. Purewick catheter placed on pt. Bed lowered and call bell within reach. Pt reports that she is going to try and get some sleep.

## 2024-09-09 NOTE — ED Notes (Signed)
@   2014 called [facility] Supervisor Will sts since pt isn't bed bound and lives alone she doesn't qualify for medical need for transport. Pt has to pay upfront ($500)

## 2024-09-09 NOTE — ED Notes (Signed)
 RN spoke with ED Admin, pt insisting on leaving ED, per Social work, ok to send pt home, however, pt needs assistance with ADL's, isn't able to get up own her own and lives alone. [facility] can not transport bc there is no receiving help at home. Pt doesn't have safe discharge plan, still incontinent of stool and urine and unable to clean self up. It is advised that pt will not be held from leaving however ED can not send pt home alone. Pt will have to find her own ride home without assistance of ED. Social Work will need to [employer].

## 2024-09-09 NOTE — ED Notes (Signed)
 Pt incontinent of urine and stool. Pt cleaned up and peri care was provided by this tech. New brief and new chux pad placed at this time. Pt has no further needs at this time.

## 2024-09-09 NOTE — ED Provider Notes (Signed)
 BP 124/65   Pulse 65   Temp 97.9 F (36.6 C)   Resp 18   SpO2 100%    Patient reevaluated.  Reiterates desire to go home.  Has decision-making capacity on my evaluation.  PT recommended postacute PT follow-up.  PT and OT stated no need for new equipment. No diarrhea on last two diaper changes. Has outpt PT visiting her at home.   Spoke w/ pt's friend Etta with patient's permission. Updated on findings and plan.   Pending transportation.     Clarine Ozell LABOR, MD 09/09/24 2351

## 2024-09-10 ENCOUNTER — Other Ambulatory Visit: Payer: Self-pay

## 2024-09-10 DIAGNOSIS — K6289 Other specified diseases of anus and rectum: Secondary | ICD-10-CM | POA: Diagnosis not present

## 2024-09-10 NOTE — Transitions of Care (Post Inpatient/ED Visit) (Signed)
 Transition of Care week 3  Visit Note  09/10/2024  Name: Carrie Larsen MRN: [id number]          DOB: 1946/01/20  Situation: Patient enrolled in [facility] 30-day program. Visit completed with patient by telephone.   Background: Admit/Discharge Date: 9/29 - 08/26/24   Primary Diagnosis: Community acquired pneumonia +Covid -19 Patient at ED 09/08/24 related to diarrhea  Initial Transition Care Management Follow-up Telephone Call Discharge Date and Diagnosis: 08/26/24, Community acquired pneumonia   Past Medical History:  Diagnosis Date   Anxiety    Arthritis    osteoarthritis   CAD (coronary artery disease)    a. 2012 ETT: no ischemia;  b. 06/2017 NSTEMI/PCI: LM nl, LAD nl, D1 70-80p, LCX nl, OM1 90 (3.0 x 23 Xience [place]), RCA dominant, 100p/m, fills via L->R collats, EF 55-65%.   Cancer [facility])    a. s/p partial left nephrectomy.   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 colon    Colon polyp 2011   COVID-19 07/2019   Diastolic dysfunction    a. 08/2015 Echo: EF 60-65%, no rwma, Gr1 DD, midlly dil LA, PASP . No significant valvular dzs; b. 06/2017 Echo: EF 60-65%, Gr1DD, mildly dil LA.   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   Obesity    Prediabetes    Pseudophakia of both eyes    Retinal cyst     Assessment: Patient Reported Symptoms: Cognitive Cognitive Status: No symptoms reported, Normal speech and language skills, Alert and oriented to person, place, and time      Neurological Neurological Review of Symptoms: No symptoms reported    HEENT HEENT Symptoms Reported: No symptoms  reported      Cardiovascular Cardiovascular Symptoms Reported: No symptoms reported    Respiratory Respiratory Symptoms Reported: No symptoms reported    Endocrine Endocrine Symptoms Reported: No symptoms reported    Gastrointestinal Gastrointestinal Symptoms Reported: No symptoms reported Additional Gastrointestinal Details: Patient states she was having diarrhea which she reports has resolved - states in 1992 she was diagnosed with colon cancer w/surgery, Patient reports also with Left nephrectomy in 1992 r/t cancer Gastrointestinal Self-Management Outcome: 4 (good)    Genitourinary Genitourinary Symptoms Reported: No symptoms reported    Integumentary Integumentary Symptoms Reported: No symptoms reported    Musculoskeletal          Psychosocial Psychosocial Symptoms Reported: No symptoms reported (patient denies)         There were no vitals filed for this visit.  Medications Reviewed Today     Reviewed by Lauro Shona LABOR, RN (Registered Nurse) on 09/10/24 at 1519  Med List Status: <None>   Medication Order Taking? Sig Documenting Provider Last Dose Status Informant     Discontinued 09/10/24 1504 (Patient Discharge)   amoxicillin-clavulanate (AUGMENTIN) 875-125 MG tablet [id number] Yes Take 1 tablet by mouth 2 (two) times daily for 7 days. Jacolyn Pae, MD  Active   apixaban  (ELIQUIS ) 2.5 MG TABS tablet [id number] Yes Take 1 tablet (2.5 mg total) by mouth 2 (two) times daily. Karamalegos,  Marsa PARAS, DO  Active Self     Discontinued 09/10/24 1504 (Patient Discharge)   atorvastatin  (LIPITOR ) 80 MG tablet [id number] Yes Take 1 tablet (80 mg total) by mouth daily. Edman Marsa PARAS, DO  Active Self     Discontinued 09/10/24 1504 (Patient Discharge)   busPIRone  (BUSPAR ) 5 MG tablet [id number] Yes Take 1 tablet (5 mg total) by mouth 2 (two) times daily. Edman Marsa PARAS, DO  Active Self     Discontinued 09/10/24 1504 (Patient Discharge)      Discontinued  09/10/24 1504 (Patient Discharge)   diphenoxylate-atropine (LOMOTIL) 2.5-0.025 MG tablet [id number] Yes Take 1 tablet by mouth 4 (four) times daily as needed for diarrhea or loose stools. Jacolyn Pae, MD  Active   lamoTRIgine  (LAMICTAL ) 150 MG tablet [id number]  Take 1 tablet (150 mg total) by mouth daily.  Patient not taking: Reported on 09/10/2024   Edman Marsa PARAS, DO  Active Self  loperamide  (IMODIUM ) 2 MG capsule [id number] Yes Take 1 capsule (2 mg total) by mouth as needed for diarrhea or loose stools. Jhonny Calvin NOVAK, MD  Active Self  pantoprazole  (PROTONIX ) 40 MG tablet [id number] Yes Take 1 tablet (40 mg total) by mouth daily before breakfast. Edman Marsa PARAS, DO  Active Self     Discontinued 09/10/24 1504 (Patient Discharge)             Recommendation:   Continue Current Plan of Care  Follow Up Plan:   Telephone follow up appointment date/time:  09/19/24 3pm with primary TOC RN, Andrea Lucky Shona Lauro RN, CCM [place]  VBCI-Population Health RN Care Manager [phone]
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
## 2024-09-10 NOTE — Progress Notes (Signed)
 Physical Therapy Treatment Patient Details Name: Carrie Larsen MRN: [id number] DOB: January 20, 1946 Today's Date: 09/10/2024   History of Present Illness 78 y.o. female with a history of hyperlipidemia, CAD, hypertension, colon cancer, GERD, DVT on Eliquis , seizure disorder who presents with diarrhea for the last several weeks.  The patient states that she was admitted recently with diarrhea as well as some cough and respiratory symptoms she was diagnosed with pneumonia and treated for that.  She states that at the time of discharge she was given loperamide  and Lomotil which helped for a while, however she ran out of them a few days ago and now over the last 3 days she has had recurrent diarrhea, 4-5 episodes per day, watery, nonbloody, associated with right sided abdominal pain    PT Comments  Patient resting on stretcher upon arrival to ED space; endorses pain/soreness to R posterior flank (FACES 6/10), improved with mobility and OOB to chair/repositioning (FACES 2/10). Completes self-care activities (peri-care, lower body dressing, donning bilat shoes) at bed-level, mod indep once items placed within reach. Progressive increase in gait distance (55') with RW, cga.  Good awareness of need for activity pacing and signs/symptoms of fatigue; indep initiating seated rest period as needed.  No buckling, LOB noted throughout distance; adequate for household negotiation as necessary.  Recliner brought to room to allow for OOB positioning; purewick removed to encourage/allow toileting on [facility] (with staff assist). Patient/RN informed and aware of status and importance of mobility opportunity throughout remaining stay to maintain mobility status.  Did discuss transportation home with patient.  Per patient, friend/neighbor should be able to meet at her home at discharge to bring manual [facility] out to car to assist with transport in/out of home.  Will need to verify with friend.  RN/TOC informed/aware.    If plan is  discharge home, recommend the following: A little help with walking and/or transfers;A little help with bathing/dressing/bathroom   Can travel by private vehicle        Equipment Recommendations       Recommendations for Other Services       Precautions / Restrictions Precautions Precautions: Fall Restrictions Weight Bearing Restrictions Per Provider Order: No     Mobility  Bed Mobility Overal bed mobility: Modified Independent Bed Mobility: Supine to Sit     Supine to sit: Used rails          Transfers Overall transfer level: Needs assistance Equipment used: Rolling walker (2 wheels) Transfers: Sit to/from Stand Sit to Stand: Contact guard assist           General transfer comment: heavy use of UEs to assist with lift off and standing balance    Ambulation/Gait Ambulation/Gait assistance: Contact guard assist Gait Distance (Feet): 55 Feet Assistive device: Rolling walker (2 wheels)         General Gait Details: partially reciprocal stepping pattern with mild steppage gait/L foot drop; slow and deliberate stepping pattern with heavy WBing bilat UEs, but no overt buckling or LOB.  Good RW position and control; very intentional with all movement.  Good awareness of need for activity pacing and signs/symptoms of fatigue; indep initiating seated rest period as needed   Stairs             Wheelchair Mobility     Tilt Bed    Modified Rankin (Stroke Patients Only)       Balance Overall balance assessment: Needs assistance Sitting-balance support: No upper extremity supported, Feet supported Sitting balance-Leahy Scale: Good  Standing balance support: Bilateral upper extremity supported Standing balance-Leahy Scale: Fair                              Hotel manager: No apparent difficulties  Cognition Arousal: Alert Behavior During Therapy: WFL for tasks assessed/performed   PT - Cognitive  impairments: No apparent impairments                         Following commands: Intact      Cueing Cueing Techniques: Verbal cues  Exercises      General Comments        Pertinent Vitals/Pain Pain Assessment Pain Assessment: Faces Faces Pain Scale: Hurts even more Pain Location: R posterior flank Pain Descriptors / Indicators: Aching Pain Intervention(s): Limited activity within patient's tolerance, Monitored during session, Repositioned    Home Living                          Prior Function            PT Goals (current goals can now be found in the care plan section) Acute Rehab PT Goals Patient Stated Goal: to keep getting stronger and to clear up the diarrhea PT Goal Formulation: With patient Time For Goal Achievement: 09/23/24 Potential to Achieve Goals: Good Progress towards PT goals: Progressing toward goals    Frequency    Min 2X/week      PT Plan      Co-evaluation              AM-PAC PT 6 Clicks Mobility   Outcome Measure  Help needed turning from your back to your side while in a flat bed without using bedrails?: None Help needed moving from lying on your back to sitting on the side of a flat bed without using bedrails?: None Help needed moving to and from a bed to a chair (including a wheelchair)?: None Help needed standing up from a chair using your arms (e.g., wheelchair or bedside chair)?: A Little Help needed to walk in hospital room?: A Little Help needed climbing 3-5 steps with a railing? : A Little 6 Click Score: 21    End of Session Equipment Utilized During Treatment: Gait belt Activity Tolerance: Patient tolerated treatment well Patient left: with call bell/phone within reach;in chair Nurse Communication: Mobility status PT Visit Diagnosis: Muscle weakness (generalized) (M62.81)     Time: 9084-9057 PT Time Calculation (min) (ACUTE ONLY): 27 min  Charges:    $Gait Training: 8-22  mins $Therapeutic Activity: 8-22 mins PT General Charges $$ ACUTE PT VISIT: 1 Visit                     Kerie Badger H. Delores, PT, DPT, NCS 09/10/24, 10:02 AM [phone]

## 2024-09-10 NOTE — ED Notes (Signed)
 Pt d/c at this time. Pt ambulatory in room to get in wheel chair with no assistance from RN. Pt taken to a vehicle where her neighbor was waiting for her to take pt home safely.

## 2024-09-10 NOTE — ED Provider Notes (Signed)
 Vitals:   09/09/24 0058 09/10/24 0044  BP:  (!) 119/56  Pulse:  66  Resp:    Temp: 97.9 F (36.6 C) 97.6 F (36.4 C)  SpO2:  97%   No issues from pt overnight.   Pending transport home-however patient does not meet criteria for [facility] unless can pay a deposit which she cannot pay.  SW will need to be re-engaged in morning.   Ernest Ronal BRAVO, MD 09/10/24 [phone]

## 2024-09-10 NOTE — ED Provider Notes (Signed)
 Patient been boarding for the past day trying to find a safe discharge plan.  Medically cleared from discharge during first encounter.  Prescribed Augmentin.  Believe has not difficulty getting a ride home and ensuring that she has a safe situation to stay in at home by herself.  Seen by PT, OT, social work, see all of their separate documentation.  Ultimately patient was able to call her neighbor and secure safe discharge and she is eager to go home herself.  Will discharge with prescription of Augmentin as started by her initial physician.  Discussed ED return precautions.   Claudene Rover, MD 09/10/24 [phone]

## 2024-09-10 NOTE — ED Notes (Signed)
 Pt walked with PT approx 55 ft. Pt walked with a steady gait with a walker.

## 2024-09-15 ENCOUNTER — Ambulatory Visit (INDEPENDENT_AMBULATORY_CARE_PROVIDER_SITE_OTHER): Admitting: Family Medicine

## 2024-09-15 ENCOUNTER — Encounter: Payer: Self-pay | Admitting: Family Medicine

## 2024-09-15 ENCOUNTER — Telehealth: Payer: Self-pay

## 2024-09-15 VITALS — BP 122/70 | HR 85

## 2024-09-15 DIAGNOSIS — K5900 Constipation, unspecified: Secondary | ICD-10-CM

## 2024-09-15 DIAGNOSIS — R152 Fecal urgency: Secondary | ICD-10-CM

## 2024-09-15 DIAGNOSIS — R197 Diarrhea, unspecified: Secondary | ICD-10-CM

## 2024-09-15 DIAGNOSIS — K644 Residual hemorrhoidal skin tags: Secondary | ICD-10-CM

## 2024-09-15 DIAGNOSIS — R159 Full incontinence of feces: Secondary | ICD-10-CM

## 2024-09-15 DIAGNOSIS — K6289 Other specified diseases of anus and rectum: Secondary | ICD-10-CM | POA: Diagnosis not present

## 2024-09-15 DIAGNOSIS — K594 Anal spasm: Secondary | ICD-10-CM | POA: Diagnosis not present

## 2024-09-15 MED ORDER — DICYCLOMINE HCL 10 MG PO CAPS: 10.0000 mg | ORAL_CAPSULE | Freq: Three times a day (TID) | ORAL | 1 refills | Status: DC

## 2024-09-15 MED ORDER — PRAMOXINE HCL (PERIANAL) 1 % EX FOAM: 1.0000 | Freq: Three times a day (TID) | CUTANEOUS | 0 refills | Status: DC | PRN

## 2024-09-15 MED ORDER — POLYETHYLENE GLYCOL 3350 17 GM/SCOOP PO POWD: 17.0000 g | Freq: Every day | ORAL | 1 refills | Status: DC | PRN

## 2024-09-15 NOTE — Telephone Encounter (Signed)
 Copied from CRM [id number]. Topic: Clinical - Prescription Issue >> Sep 15, 2024  4:10 PM Antwanette L wrote: Reason for CRM: Carlyon, a Pharmacologist with [employer], called to report that Pramoxine (Proctofoam) 1% foam is currently unavailable in the [place] She noted that Pramoxine HC (Proctofoam HC) is available but significantly more expensive. Carlyon is requesting a callback at [phone] as soon as possible.

## 2024-09-15 NOTE — Telephone Encounter (Signed)
 Called pharmacy. They will substitute the foam with an alternative topical Tronolane topical cream with the pramoxine and zinc oxide, and other rx will be delivered unaffected.  Marsa Officer, DO [facility] 09/15/2024, 5:31 PM
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
## 2024-09-15 NOTE — Addendum Note (Signed)
 Addended by: EDMAN MARSA PARAS on: 09/15/2024 05:31 PM   Modules accepted: Orders

## 2024-09-15 NOTE — Patient Instructions (Addendum)
 Thank you for coming to the office today.  CT imaging shows large stool burden and proctitis inflammation of rectum  Dicyclomine (Bentyl) for cramping and bowel urgency diarrhea up to 3 times a day with meals and bedtime.  IBGard OTC Peppermint Oil (Triple Coated Capsule) 180mg  take one 3 times daily to reduce diarrhea  Proctofoam for inflammation and burning at rectum/anal area. If not covered or high cost, okay to switch and use Preparation H cream  For Constipation (less frequent bowel movement that can be hard dry or involve straining).  Recommend trying OTC Miralax  17g = 1 capful in large glass water once daily for now, try several days to see if working, goal is soft stool or BM 1-2 times daily, if too loose then reduce dose or try every other day. If not effective may need to increase it to 2 doses at once in AM or may do 1 in morning and 1 in afternoon/evening  May increase miralax  sooner rather than waiting if not clearing out the stool.  - This medicine is very safe and can be used often without any problem and will not make you dehydrated. It is good for use on AS NEEDED BASIS or even MAINTENANCE therapy for longer term for several days to weeks at a time to help regulate bowel movements  Other more natural remedies or preventative treatment: - Increase hydration with water - Increase fiber in diet (high fiber foods = vegetables, leafy greens, oats/grains) - May take OTC Fiber supplement (metamucil powder or pill/gummy) - May try OTC Probiotic  --------------  [facility] ([facility])  [address] - [address] [place], [place] [address] Phone: [phone]   Please schedule a Follow-up Appointment to: Return if symptoms worsen or fail to improve.  If you have any other questions or concerns, please feel free to call the office or send a message through MyChart. You may also schedule an earlier appointment if necessary.  Additionally, you may be  receiving a survey about your experience at our office within a few days to 1 week by e-mail or mail. We value your feedback.  Marsa Officer, DO [facility], [place]

## 2024-09-17 ENCOUNTER — Ambulatory Visit: Payer: Self-pay

## 2024-09-17 ENCOUNTER — Other Ambulatory Visit: Payer: Self-pay | Admitting: *Deleted

## 2024-09-17 ENCOUNTER — Other Ambulatory Visit: Payer: Self-pay | Admitting: Family Medicine

## 2024-09-17 DIAGNOSIS — K594 Anal spasm: Secondary | ICD-10-CM

## 2024-09-17 MED ORDER — BACLOFEN 10 MG PO TABS: 5.0000 mg | ORAL_TABLET | Freq: Three times a day (TID) | ORAL | 0 refills | Status: DC | PRN

## 2024-09-17 NOTE — Telephone Encounter (Signed)
 This encounter was created in error - please disregard.

## 2024-09-17 NOTE — Telephone Encounter (Signed)
 FYI Only or Action Required?: FYI only for provider.  Patient was last seen in primary care on 09/15/2024 by Edman Marsa PARAS, DO.  Called Nurse Triage reporting Diarrhea.  Symptoms began several weeks ago.  Interventions attempted: Prescription medications: lomotil. Bentyl, immodium.  Symptoms are: unchanged.  Triage Disposition: See HCP Within 4 Hours (Or PCP Triage)  Patient/caregiver understands and will follow disposition?: No, wishes to speak with PCP  Home health just wanted PCP to be aware this was still ongoing.   Copied from CRM [id number]. Topic: Clinical - Red Word Triage >> Sep 17, 2024  2:59 PM Olam RAMAN wrote: Red Word that prompted transfer to Nurse Triage:  Armin from [facility] medical home healthcare [phone]  pt is having a lot of diahreah, watery and rectum spasms and would like ot speak to nurse to make aware. Pt is lamotrigine  Mg 150. Caller would like to know why pt is taking it since pt does not have seizures Reason for Disposition  [1] SEVERE diarrhea (e.g., 7 or more times / day more than normal) AND [2] age > 60 years  Answer Assessment - Initial Assessment Questions Pt has been having ongoing diarrhea for over 3 weeks. Rhea from home health called and stated that pt had 13 watery stools yesterday and is on track to do the same again today. It does not sound like patient has been taking all of the medications as prescribed. RN heard her say in the background, I didn't want to get them messed up with my other pills. RN did read instructions on how often she is supposed to take the medications. Rhea stated understanding. No change in pts condition, same as it has been. They did call GI and get a sooner appointment with them.    1. DIARRHEA SEVERITY: How bad is the diarrhea? How many more stools have you had in the past 24 hours than normal?      13 yesterday about the same today 2. ONSET: When did the diarrhea begin?      Over 3 weeks ago 3. STOOL  DESCRIPTION:  How loose or watery is the diarrhea? What is the stool color? Is there any blood or mucous in the stool?     watery  8. HYDRATION: Any signs of dehydration? (e.g., dry mouth [not just dry lips], too weak to stand, dizziness, new weight loss) When did you last urinate?     Home health states she is weak but has been. Drinking fluids  Protocols used: Diarrhea-A-AH

## 2024-09-17 NOTE — Telephone Encounter (Signed)
 FYI Only or Action Required?: Action required by provider: update on patient condition and medication question.  Patient was last seen in primary care on 09/15/2024 by Edman Marsa PARAS, DO.  Called Nurse Triage reporting Advice Only. .  Triage Disposition: Call PCP When Office is Open  Patient/caregiver understands and will follow disposition?: Yes          No CRM created patient return called.  Reason for Disposition  [1] Caller requesting NON-URGENT health information AND [2] PCP's office is the best resource  Answer Assessment - Initial Assessment Questions This RN read PCP's note verbatim to patient. Verbalized understanding. Requesting information regarding her medication. She is inquiring if she can take MiraLAX  once a day instead of twice a day. Also requesting for the Baclofen to be sent to pharmacy below.    [facility] [place], [place] - [address]  [address], [place] [address]           1. REASON FOR CALL: What is the main reason for your call? or How can I best help you?     Returning call  Protocols used: Information Only Call - No Triage-A-AH

## 2024-09-17 NOTE — Transitions of Care (Post Inpatient/ED Visit) (Signed)
 Transition of Care week 4  Visit Note  09/17/2024  Name: Carrie Larsen MRN: [id number]          DOB: 02/10/1946  Situation: Patient enrolled in [facility] 30-day program. Visit completed with Ms. Shuster by telephone.   Background:   Initial Transition Care Management Follow-up Telephone Call Discharge Date and Diagnosis: 08/26/24, Community acquired pneumonia   Past Medical History:  Diagnosis Date   Anxiety    Arthritis    osteoarthritis   CAD (coronary artery disease)    a. 2012 ETT: no ischemia;  b. 06/2017 NSTEMI/PCI: LM nl, LAD nl, D1 70-80p, LCX nl, OM1 90 (3.0 x 23 Xience [place]), RCA dominant, 100p/m, fills via L->R collats, EF 55-65%.   Cancer [facility])    a. s/p partial left nephrectomy.   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 colon    Colon polyp 2011   COVID-19 07/2019   Diastolic dysfunction    a. 08/2015 Echo: EF 60-65%, no rwma, Gr1 DD, midlly dil LA, PASP . No significant valvular dzs; b. 06/2017 Echo: EF 60-65%, Gr1DD, mildly dil LA.   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   Obesity    Prediabetes    Pseudophakia of both eyes    Retinal cyst     Assessment: Patient Reported Symptoms: Cognitive Cognitive Status: Able to follow simple commands, Normal speech and language skills, Alert and oriented to person, place, and time      Neurological Neurological Review of Symptoms: No symptoms reported    HEENT HEENT Symptoms Reported: Not assessed      Cardiovascular Cardiovascular Symptoms Reported: No symptoms reported    Respiratory Respiratory Symptoms Reported: No  symptoms reported    Endocrine Endocrine Symptoms Reported: Not assessed    Gastrointestinal Gastrointestinal Symptoms Reported: Diarrhea Additional Gastrointestinal Details: Patient reports 12 episodes of diarrhea today estimates 1/2 cup of stool per episode. Wearing briefs. RNCM reviewed recent ED and PCP provider notes and discussed with patient. Educated on constipation. Medication list updated in Epic. Collaborated with PCP. Patient has GI consult on 09/28/24. Gastrointestinal Management Strategies: Medication therapy, Adequate rest, Incontinence garment/pad Gastrointestinal Self-Management Outcome: 3 (uncertain)    Genitourinary Genitourinary Symptoms Reported: No symptoms reported    Integumentary Integumentary Symptoms Reported: Not assessed    Musculoskeletal Musculoskelatal Symptoms Reviewed: Limited mobility Additional Musculoskeletal Details: ambulates with walker, uses wheelchair when going to appointments Musculoskeletal Self-Management Outcome: 4 (good) Musculoskeletal Comment: HH PT      Psychosocial Psychosocial Symptoms Reported: Not assessed         There were no vitals filed for this visit.  Medications Reviewed Today     Reviewed by Lucky Andrea LABOR, RN (Registered Nurse) on 09/17/24 at 1557  Med List Status: <None>   Medication Order Taking? Sig Documenting Provider Last Dose Status Informant  apixaban  (ELIQUIS ) 2.5 MG TABS tablet [id number] Yes Take 1 tablet (2.5 mg total) by mouth 2 (two) times daily. Edman Marsa PARAS, DO  Active Self  atorvastatin  (LIPITOR ) 80 MG tablet [id number] Yes Take 1 tablet (  80 mg total) by mouth daily. Edman Marsa PARAS, DO  Active Self  busPIRone  (BUSPAR ) 5 MG tablet [id number] Yes Take 1 tablet (5 mg total) by mouth 2 (two) times daily. Edman Marsa PARAS, DO  Active Self  dicyclomine (BENTYL) 10 MG capsule [id number] Yes Take 1 capsule (10 mg total) by mouth 4 (four) times daily -  before meals and at bedtime.  Karamalegos, Marsa PARAS, DO  Active   diphenoxylate-atropine (LOMOTIL) 2.5-0.025 MG tablet [id number] Yes Take 1 tablet by mouth 4 (four) times daily as needed for diarrhea or loose stools. Jacolyn Pae, MD  Active   loperamide  (IMODIUM ) 2 MG capsule [id number] Yes Take 1 capsule (2 mg total) by mouth as needed for diarrhea or loose stools. Jhonny Calvin NOVAK, MD  Active Self  pantoprazole  (PROTONIX ) 40 MG tablet [id number] Yes Take 1 tablet (40 mg total) by mouth daily before breakfast. Edman Marsa PARAS, DO  Active Self  Peppermint Oil (IBGARD PO) [id number] Yes Take 2 capsules by mouth daily. [provider]  Active   polyethylene glycol powder (GLYCOLAX /MIRALAX ) 17 GM/SCOOP powder [id number] Yes Take 17 g by mouth daily as needed for moderate constipation or mild constipation. Edman Marsa PARAS, DO  Active   Pramoxine HCl (TRONOLANE RE) [id number] Yes Place rectally 3 (three) times daily as needed (rectal pain). [provider]  Active             Recommendation:   Continue Current Plan of Care  Follow Up Plan:   Telephone follow-up in 1 week  Andrea Dimes RN, BSN [place]  [facility] Health RN Care Manager [phone]

## 2024-09-17 NOTE — Telephone Encounter (Signed)
 Please call her back with more information and let me know if further questions.  Here are my responses to each.  Medication question - Lamotrigine . We do not have this on her current medication list. I reviewed her chart. It was prescribed at one point in 2024 for mood/anxiety mood stabilization. However the chart also states history of seizure disorder, I am unsure the exact time of this diagnosis. Based on the review it seems like this was started at some point in the hospital over the past year.  She may discontinue Lamotrigine . If she has been on it for a long time, she can cut it in half for half tab for 1 week then discontinue. If she has just recently resumed it after hospital, she can stop taking it immediately.  Diarrhea is not listed as a side effect of this medication.   2.  I see she has an appointment with [facility] on 09/28/24. We discussed her diarrhea at length and I advised her that the imaging showed a lot of stool back up constipation and I was worried the watery stool was a result of the constipation.   She should have been able to clear out the constipation with miralax . If she is only having liquid and no actual stool, then she is likely still constipated unfortunately despite the diarrhea episodes.  For the rectal spasms I can order a muscle relaxant Baclofen that can be taken as needed and may help ease rectal muscle spasms.  Marsa Officer, DO [facility] 09/17/2024, 4:04 PM

## 2024-09-17 NOTE — Telephone Encounter (Signed)
 Left message for patient to return call OK to advise

## 2024-09-17 NOTE — Patient Instructions (Signed)
 Visit Information  Thank you for taking time to visit with me today. Please don't hesitate to contact me if I can be of assistance to you before our next scheduled telephone appointment.   Following is a copy of your care plan:   Goals Addressed             This Visit's Progress    VBCI Transitions of Care (TOC) Care Plan       Problems:  Recent Hospitalization for treatment of Pneumonia and +COVID  Goal:  Over the next 30 days, the patient will not experience hospital readmission  Interventions:  Transitions of Care: Doctor Visits  - discussed the importance of doctor visits Post discharge activity limitations prescribed by provider reviewed Reviewed diet and exercise Discussed reasons to contact provider Medications reviewed in detail and updated in Epic Verified [facility] PT has resumed for twice weekly visits-HH was at patient's home today Reviewed upcoming appointments including:09/28/24 for GI Consult, discussed the importance of attending Verified patient has transportation to upcoming appointments Reviewed recent ED and PCP visits, discussed with patient Provided education on constipation and discussed the importance of taking medications as prescribed Collaboration with PCP Plan to discuss referral to Pharmacy, SW and RN at next scheduled outreach   Patient Self Care Activities:  Attend all scheduled provider appointments Call provider office for new concerns or questions  Notify RN Care Manager of [facility] call rescheduling needs Participate in Transition of Care Program/Attend TOC scheduled calls Take medications as prescribed    Plan:  Telephone follow up appointment with care management team member scheduled for:  09/25/24  at 11am        Patient verbalizes understanding of instructions and care plan provided today and agrees to view in MyChart. Active MyChart status and patient understanding of how to access instructions and care plan via MyChart confirmed with  patient.     Telephone follow up appointment with care management team member scheduled for:09/25/24 at 11am  Please call the care guide team at [phone] if you need to cancel or reschedule your appointment.   Please call [phone]-TALK (toll free, 24 hour hotline) call 911 if you are experiencing a Mental Health or Behavioral Health Crisis or need someone to talk to.  Andrea Dimes RN, BSN [place]  [facility] Health RN Care Manager [phone]

## 2024-09-18 NOTE — Telephone Encounter (Signed)
 I already sent Baclofen to her pharmacy yesterday and asked them to deliver but that was late in the day.  Miralax  is 1 dose = 1 capful of powder in a drink. She should go up to 2 capfuls next, then gradually up to 3 then 4 if needed in one drink. I would not take multiple times a day, I would prefer up to 2-4 caps in one drink until she can clean out.  Carrie Officer, DO [facility] 09/18/2024, 11:50 AM

## 2024-09-18 NOTE — Telephone Encounter (Signed)
 Can you add her to Dr MARLA for 11/25 at 1:40 she is aware of the appointment   Spoke with patient she is taking Baclofen which is helping. She is taking Miralax  once day. She stated this is helping and she feels much better.

## 2024-09-18 NOTE — Telephone Encounter (Signed)
 Patient was advised see other message from 09/17/24 Call center informed patient

## 2024-09-23 ENCOUNTER — Other Ambulatory Visit: Payer: Self-pay

## 2024-09-23 DIAGNOSIS — Z85038 Personal history of other malignant neoplasm of large intestine: Secondary | ICD-10-CM | POA: Insufficient documentation

## 2024-09-23 DIAGNOSIS — D631 Anemia in chronic kidney disease: Secondary | ICD-10-CM | POA: Insufficient documentation

## 2024-09-23 DIAGNOSIS — E871 Hypo-osmolality and hyponatremia: Secondary | ICD-10-CM | POA: Insufficient documentation

## 2024-09-23 DIAGNOSIS — N179 Acute kidney failure, unspecified: Secondary | ICD-10-CM | POA: Insufficient documentation

## 2024-09-23 DIAGNOSIS — I129 Hypertensive chronic kidney disease with stage 1 through stage 4 chronic kidney disease, or unspecified chronic kidney disease: Secondary | ICD-10-CM | POA: Insufficient documentation

## 2024-09-25 ENCOUNTER — Encounter: Payer: Self-pay | Admitting: *Deleted

## 2024-09-25 ENCOUNTER — Telehealth: Payer: Self-pay | Admitting: *Deleted
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
## 2024-09-28 ENCOUNTER — Other Ambulatory Visit: Payer: Self-pay

## 2024-09-28 ENCOUNTER — Ambulatory Visit
Admission: RE | Admit: 2024-09-28 | Discharge: 2024-09-28 | Disposition: A | Discharge status: Home / Self Care | Attending: Family Medicine | Admitting: Family Medicine

## 2024-09-28 ENCOUNTER — Telehealth: Payer: Self-pay

## 2024-09-28 ENCOUNTER — Encounter: Payer: Self-pay | Admitting: Family Medicine

## 2024-09-28 ENCOUNTER — Ambulatory Visit (INDEPENDENT_AMBULATORY_CARE_PROVIDER_SITE_OTHER): Admitting: Family Medicine

## 2024-09-28 ENCOUNTER — Ambulatory Visit
Admission: RE | Admit: 2024-09-28 | Discharge: 2024-09-28 | Disposition: A | Source: Ambulatory Visit | Attending: Family Medicine | Admitting: Family Medicine

## 2024-09-28 VITALS — BP 124/77 | HR 75 | Temp 97.8°F | Ht 70.0 in | Wt 208.0 lb

## 2024-09-28 DIAGNOSIS — R197 Diarrhea, unspecified: Secondary | ICD-10-CM | POA: Insufficient documentation

## 2024-09-28 DIAGNOSIS — Z860100 Personal history of colon polyps, unspecified: Secondary | ICD-10-CM

## 2024-09-28 DIAGNOSIS — R159 Full incontinence of feces: Secondary | ICD-10-CM

## 2024-09-28 DIAGNOSIS — K594 Anal spasm: Secondary | ICD-10-CM

## 2024-09-28 MED ORDER — NA SULFATE-K SULFATE-MG SULF 17.5-3.13-1.6 GM/177ML PO SOLN: 1.0000 | Freq: Once | ORAL | 0 refills | Status: AC

## 2024-09-28 NOTE — Patient Instructions (Addendum)
 If abdominal Xray shows stool burden or constipation: Please do the following.  Purchase a bottle of Miralax  over the counter as well as a box of 5 mg dulcolax tablets. Take 4 dulcolax tablets. Wait 1 hour. You will then drink 6-8 capfuls of Miralax  mixed in an adequate amount of water/juice/gatorade (you may choose which of these liquids to drink) over the next 2-3 hours. You should expect results within 1 to 6 hours after completing the bowel purge. Go to the er if you have severe AB pain, can not pass gas or stool in over 12 hours, can not hold down any food.    If there is not stool burden or constipation:  Add on benefiber and continue Imodium  as needed

## 2024-09-28 NOTE — Telephone Encounter (Signed)
 Spoke with patient- colonoscopy scheduled 12/07/24- prep sent to pharmacy- Eliquis  clearance faxed to Dr.Schnier.   I let patient know once results of the abdominal xray I will call and let her know.

## 2024-09-28 NOTE — Telephone Encounter (Signed)
 Tried to reach patient to schedule colonoscopy- mail box full-unable to leave message.

## 2024-09-29 ENCOUNTER — Ambulatory Visit: Payer: Self-pay | Admitting: Family Medicine

## 2024-09-29 MED ORDER — PEG 3350-KCL-NABCB-NACL-NASULF 236 G PO SOLR: 4000.0000 mL | Freq: Once | ORAL | 0 refills | Status: AC

## 2024-09-29 NOTE — Addendum Note (Signed)
 Addended by: LANNIE ANDREA GRADE on: 09/29/2024 01:55 PM   Modules accepted: Orders

## 2024-09-30 ENCOUNTER — Telehealth: Payer: Self-pay | Admitting: Family Medicine

## 2024-10-01 ENCOUNTER — Telehealth: Payer: Self-pay

## 2024-10-01 NOTE — Telephone Encounter (Signed)
 Per Kate with PT  states that pt has not had a bowel movement in a few weeks. She also drink golite that was given to her by Grayce and pt states she hasn't has bowel movement yet. Pt is having bad stomach pain at a level 7

## 2024-10-01 NOTE — Telephone Encounter (Signed)
 I spoke to pt and she stated that she finished the prep last night after 10pm... Pt reports an increase in abdominal cramping and bloating... However, she feels like there may have been some movement with the stool today as the pressure in her abdomen and rectum increased... Pt stated that she can have someone pick up any additional medications OTC that may help and would like to at least give until tomorrow to see if she gets relief.SABRASABRA

## 2024-10-02 NOTE — Telephone Encounter (Signed)
 I called pt to check in to see if she was able to have a BM... She states that she has finally been able to use the bathroom and she feels better... She said she wanted to thank our office for being so nice and for checking on her... Pt will give us  a call back if she has anymore questions or concerns

## 2024-10-05 ENCOUNTER — Ambulatory Visit: Payer: Self-pay

## 2024-10-05 ENCOUNTER — Telehealth: Payer: Self-pay | Admitting: *Deleted

## 2024-10-05 DIAGNOSIS — Z23 Encounter for immunization: Secondary | ICD-10-CM

## 2024-10-05 DIAGNOSIS — Z719 Counseling, unspecified: Secondary | ICD-10-CM
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
## 2024-10-05 NOTE — Patient Instructions (Signed)
 Visit Information  Thank you for taking time to visit with me today. Please don't hesitate to contact me if I can be of assistance to you before our next scheduled telephone appointment.   Following is a copy of your care plan:   Goals Addressed             This Visit's Progress    COMPLETED: VBCI Transitions of Care (TOC) Care Plan       Problems:  Recent Hospitalization for treatment of Pneumonia and +COVID  Goal: Met Over the next 30 days, the patient will not experience hospital readmission  Interventions:  Transitions of Care: Doctor Visits  - discussed the importance of doctor visits Reviewed diet and exercise Discussed reasons to contact provider [facility] PT has resumed for twice weekly visits Reviewed upcoming appointments including:10/21/24 with PCP, 11/28/24 with GI and 12/21/24 for Colonoscopy Provided education on constipation and discussed the importance of taking medications as prescribed Reviewed GI provider note and discussed Discussed referral to Longitudinal Case Management-patient declined   Patient Self Care Activities:  Attend all scheduled provider appointments Call provider office for new concerns or questions  Notify RN Care Manager of TOC call rescheduling needs Participate in Transition of Care Program/Attend TOC scheduled calls Take medications as prescribed    Plan:  No further follow up required:          Patient verbalizes understanding of instructions and care plan provided today and agrees to view in MyChart. Active MyChart status and patient understanding of how to access instructions and care plan via MyChart confirmed with patient.     No further follow up required:    Please call the care guide team at [phone] if you need to cancel or reschedule your appointment.   Please call [phone]-TALK (toll free, 24 hour hotline) call 911 if you are experiencing a Mental Health or Behavioral Health Crisis or need someone to talk  to.  Andrea Dimes RN, BSN [place]  [facility] RN Care Manager [phone]

## 2024-10-05 NOTE — Progress Notes (Signed)
 Homevisit to homebound pt who requested flu and covid vaccines.   Completed vaccine screening questions and pt signed (sent for scanning).  Pt reports having covid early October 2025.  RN consulted Dr. Macario since it has been less than 90 days since infected.  Per Dr. Macario, RN explained to pt covid vaccine today may cause possible increased side effects and possibility of lesser immunity to vaccine since has recent natural immunity and pt can decide if prefers to defer vaccine today.  Pt decided to wait 90 day period.  VIS provided and post vaccination care reviewed.  Fluzone high dose administered by Devere Pizza RN; tolerated well.  [facility] updated and copy mailed to pt.  Pt also requests Shingles vaccine #2 and will call in a few weeks to arrange homevisit.  Pt will also call when covid vaccine is due.  Shona KATHEE Diesel, RN

## 2024-10-08 NOTE — Progress Notes (Signed)
 Attestation of Attending Supervision of RN:  I was consulted regarding this patient case and agree with the documentation below.   Dorothyann Helling, MD Clinical Services Medical Director [facility] Department 10/08/24  2:01 PM

## 2024-10-09 ENCOUNTER — Telehealth: Payer: Self-pay

## 2024-10-09 NOTE — Telephone Encounter (Signed)
 Copied from CRM #[id number]. Topic: General - Other >> Oct 09, 2024  2:25 PM [name] wrote: Reason for CRM: Patient is immobile and is needing to have a form filled out by provider so she can move her mailbox - Patient is wanting to know if the form/note is something he already will have or if she will need to contact the post office and see if they have one they can provide to her. I advised she may want to contact the post office but I am not 100%. Asking clinic for confirmation - please reach out to patient and advise # [phone]  Has appt scheduled 11/26 with Dr. MARLA

## 2024-10-12 NOTE — Telephone Encounter (Signed)
 Spoke with patient, explained to her this can be difficult at times. She will do more research and find out what needs or can be done to get her mailbox moved.

## 2024-10-12 NOTE — Telephone Encounter (Signed)
 Please call her back with my reply.  I understand her concern and this request, however this particular issue of relocating a mailbox from the street to the door or from one side of street to the other, is actually a complicated issue. It is not necessarily a medical request that I can control - it is up to the post office.  In my experience, there is not a form or paper to be completed. I have written letters in the past and given them to patients to turn in. I will say that I have not seen the post office grant these requests often, it is possible but they usually try to avoid relocating mail boxes. So, I am not sure if they will accept her request or not. But we can try and talk about it when she has her apt.  If she wants to ask the post office first that is fine as well they can tell her what to do next.  Marsa Officer, DO [facility] 10/12/2024, 12:53 PM

## 2024-10-13 ENCOUNTER — Ambulatory Visit: Admitting: Family Medicine

## 2024-10-13 ENCOUNTER — Ambulatory Visit: Admitting: Podiatry

## 2024-10-15 ENCOUNTER — Ambulatory Visit: Admitting: Podiatry

## 2024-10-21 ENCOUNTER — Ambulatory Visit

## 2024-10-21 ENCOUNTER — Ambulatory Visit: Admitting: Family Medicine

## 2024-10-21 ENCOUNTER — Encounter: Payer: Self-pay | Admitting: Family Medicine

## 2024-10-21 VITALS — BP 124/72 | HR 79 | Ht 70.0 in

## 2024-10-21 DIAGNOSIS — M109 Gout, unspecified: Secondary | ICD-10-CM

## 2024-10-21 DIAGNOSIS — R197 Diarrhea, unspecified: Secondary | ICD-10-CM

## 2024-10-21 DIAGNOSIS — K5900 Constipation, unspecified: Secondary | ICD-10-CM | POA: Diagnosis not present

## 2024-10-21 DIAGNOSIS — G8194 Hemiplegia, unspecified affecting left nondominant side: Secondary | ICD-10-CM

## 2024-10-21 NOTE — Patient Instructions (Addendum)
 Thank you for coming to the office today.   Gastroenterology ([facility]) [address] - [address] [place], [place] [address] Phone: [phone]  Please schedule a Follow-up Appointment to: Return in about 3 months (around 01/21/2025) for 3 month fasting lab > 1 week later Annual Physical.  If you have any other questions or concerns, please feel free to call the office or send a message through MyChart. You may also schedule an earlier appointment if necessary.  Additionally, you may be receiving a survey about your experience at our office within a few days to 1 week by e-mail or mail. We value your feedback.  Marsa Officer, DO [facility], [place]

## 2024-10-21 NOTE — Progress Notes (Addendum)
 Subjective:    Patient ID: Carrie Larsen, female    DOB: [birth year], 78 y.o.   MRN: [id number]  Carrie Larsen is a 78 y.o. female presenting on 10/21/2024 for Medical Management of Chronic Issues and Diarrhea   HPI  Discussed the use of AI scribe software for clinical note transcription with the patient, who gave verbal consent to proceed.  History of Present Illness   Carrie Larsen is a 78 year old female who presents with persistent diarrhea.  Diarrhea and bowel dysfunction - Persistent diarrhea since discharge from [facility] in December, we have attempted to manage here with recent follow-up visits, last with me 09/15/24 diagnosed with large stool burden likely fecal impaction, leading to her diarrhea issues and complications with rectal symptoms.  She has since established with GI at [facility], and has had further evaluation including [facility] confirming stool burden and impaction. Treatment has taken place and she has improved overall and now constipation resolved. But still has diarrhea.  - Severe diarrhea occurs without at least four Pepto-Bismol Ultra Caplets daily - Miralax  and enemas have resulted in more consistent bowel movements, though not yet normal - Imodium  and dicyclomine  have been ineffective for diarrhea - Pepto-Bismol has been the most effective medication for symptom control - IBGuard caused nausea and was discontinued  Gout symptoms - History of recurrent gout, primarily affecting the ankles - Previously treated with colchicine  and allopurinol  - No gout symptoms in the past year and a half during hospitalization - No longer has colchicine  or allopurinol  at home           10/21/2024    1:32 PM 09/10/2024    3:38 PM 08/27/2024   11:02 AM  Depression screen PHQ 2/9  Decreased Interest 0 0 0  Down, Depressed, Hopeless 0 0 0  PHQ - 2 Score 0 0 0       10/21/2024    1:32 PM 09/10/2024    3:39 PM 08/27/2024   11:02 AM 08/21/2024   10:08 AM  GAD  7 : Generalized Anxiety Score  Nervous, Anxious, on Edge 0 0 0 0  Control/stop worrying 0 0 0 1  Worry too much - different things 0 0 0 1  Trouble relaxing 0 0 0 0  Restless 0 0 0 0  Easily annoyed or irritable 0 0 0 0  Afraid - awful might happen 0 0 0 0  Total GAD 7 Score 0 0 0 2  Anxiety Difficulty Not difficult at all  Not difficult at all Not difficult at all    Social History   Tobacco Use   Smoking status: Never    Passive exposure: Never   Smokeless tobacco: Never  Vaping Use   Vaping status: Never Used  Substance Use Topics   Alcohol use: No   Drug use: No    Review of Systems Per HPI unless specifically indicated above     Objective:    BP 124/72 (BP Location: Right Arm, Patient Position: Sitting, Cuff Size: Normal)   Pulse 79   Ht 5' 10 (1.778 m)   SpO2 98%   BMI 29.84 kg/m   Wt Readings from Last 3 Encounters:  09/28/24 208 lb (94.3 kg)  08/24/24 208 lb (94.3 kg)  08/21/24 208 lb (94.3 kg)    Physical Exam Vitals and nursing note reviewed.  Constitutional:      General: She is not in acute distress.    Appearance: She is well-developed. She is  not diaphoretic.     Comments: Well-appearing, comfortable, cooperative  HENT:     Head: Normocephalic and atraumatic.  Eyes:     General:        Right eye: No discharge.        Left eye: No discharge.     Conjunctiva/sclera: Conjunctivae normal.  Neck:     Thyroid : No thyromegaly.  Cardiovascular:     Rate and Rhythm: Normal rate and regular rhythm.     Heart sounds: Normal heart sounds. No murmur heard. Pulmonary:     Effort: Pulmonary effort is normal. No respiratory distress.     Breath sounds: Normal breath sounds. No wheezing or rales.  Musculoskeletal:     Cervical back: Normal range of motion and neck supple.     Comments: Wheelchair bound, does not have walker with her 4/5 muscle strength left lower extremity, hip flex extend dorsi plantar flex  Lymphadenopathy:     Cervical: No cervical  adenopathy.  Skin:    General: Skin is warm and dry.     Findings: No erythema or rash.  Neurological:     Mental Status: She is alert and oriented to person, place, and time.  Psychiatric:        Behavior: Behavior normal.     Comments: Well groomed, good eye contact, normal speech and thoughts     I have personally reviewed the radiology report from [facility] 09/28/24  CLINICAL DATA:  constipation   EXAM: ABDOMEN - 1 VIEW   COMPARISON:  06/13/2024   FINDINGS: Nonobstructive bowel gas pattern.Moderate volume fecal loading throughout the colon and rectum.No pneumoperitoneum. No organomegaly or radiopaque calculi. Vascular stent along the right pelvis. Scattered surgical clips in the midline abdomen. No acute fracture or destructive lesion. Multilevel degenerative disc disease of the spine.   IMPRESSION: Nonobstructive bowel gas pattern. Moderate volume fecal loading throughout the colon, as can be seen in constipation. Probable stool ball in the rectum measuring 9 cm.     Electronically Signed   By: Rogelia Myers M.D.   On: 09/29/2024 12:00  Results for orders placed or performed during the hospital encounter of 09/08/24  Lipase, blood   Collection Time: 09/08/24  1:32 PM  Result Value Ref Range   Lipase 28 11 - 51 U/L  Comprehensive metabolic panel   Collection Time: 09/08/24  1:32 PM  Result Value Ref Range   Sodium 144 135 - 145 mmol/L   Potassium 4.9 3.5 - 5.1 mmol/L   Chloride 107 98 - 111 mmol/L   CO2 27 22 - 32 mmol/L   Glucose, Bld 86 70 - 99 mg/dL   BUN 20 8 - 23 mg/dL   Creatinine, Ser 8.57 (H) 0.44 - 1.00 mg/dL   Calcium  9.7 8.9 - 10.3 mg/dL   Total Protein 6.6 6.5 - 8.1 g/dL   Albumin 3.7 3.5 - 5.0 g/dL   AST 21 15 - 41 U/L   ALT 13 0 - 44 U/L   Alkaline Phosphatase 110 38 - 126 U/L   Total Bilirubin 0.5 0.0 - 1.2 mg/dL   GFR, Estimated 38 (L) >60 mL/min   Anion gap 10 5 - 15  CBC   Collection Time: 09/08/24  1:32 PM  Result Value Ref Range    WBC 7.0 4.0 - 10.5 K/uL   RBC 3.87 3.87 - 5.11 MIL/uL   Hemoglobin 10.9 (L) 12.0 - 15.0 g/dL   HCT 65.3 (L) 63.9 - 53.9 %   MCV 89.4 80.0 -  100.0 fL   MCH 28.2 26.0 - 34.0 pg   MCHC 31.5 30.0 - 36.0 g/dL   RDW 86.3 88.4 - 84.4 %   Platelets 294 150 - 400 K/uL   nRBC 0.0 0.0 - 0.2 %  Urinalysis, Routine w reflex microscopic -Urine, Clean Catch   Collection Time: 09/08/24  1:33 PM  Result Value Ref Range   Color, Urine YELLOW (A) YELLOW   APPearance CLEAR (A) CLEAR   Specific Gravity, Urine 1.014 1.005 - 1.030   pH 7.0 5.0 - 8.0   Glucose, UA NEGATIVE NEGATIVE mg/dL   Hgb urine dipstick NEGATIVE NEGATIVE   Bilirubin Urine NEGATIVE NEGATIVE   Ketones, ur NEGATIVE NEGATIVE mg/dL   Protein, ur NEGATIVE NEGATIVE mg/dL   Nitrite NEGATIVE NEGATIVE   Leukocytes,Ua NEGATIVE NEGATIVE      Assessment & Plan:   Problem List Items Addressed This Visit     Constipation   Diarrhea - Primary   Gout involving toe     Chronic diarrhea / Constipation Established with GI recently, diagnosed with overflow incontinence diarrhea with fecal impaction. She was treated with clean out on miralax  and other rx management with resolution of impaction. Her diarrhea improved but has not resolved. Persistent diarrhea managed with Pepto-Bismol Ultra Capulets. Awaiting colonoscopy for further evaluation. 11/2024 Failed Dicyclomine , Peppermint Oil, previous treatments without success - Continue Pepto-Bismol Ultra Capulets as needed. - Contact gastroenterologist to report persistent diarrhea and discuss further management. Noted that she told GI that her symptoms resolved, but never told them that it returned. - Proceed with scheduled colonoscopy in January. And GI follow-up  General Health Maintenance - Scheduled physical examination and blood work for February.      No orders of the defined types were placed in this encounter.   No orders of the defined types were placed in this encounter.   Follow  up plan: Return in about 3 months (around 01/21/2025) for 3 month Annual Physical lab after.    Marsa Officer, DO [facility] 10/21/2024, 1:56 PM

## 2024-10-26 ENCOUNTER — Ambulatory Visit

## 2024-10-26 DIAGNOSIS — Z719 Counseling, unspecified: Secondary | ICD-10-CM

## 2024-10-26 DIAGNOSIS — Z23 Encounter for immunization: Secondary | ICD-10-CM

## 2024-10-26 NOTE — Progress Notes (Signed)
 HV to homebound pt to administer #2 shingles vaccine as requested by pt.  Per epic and [facility], received Shingrix vaccine 12/07/19.   Vaccine screening form completed and signed by pt (sent for scanning).   VIS given and reviewed post vaccination care; questions answered.    Shingrix vaccine administered by Devere Pizza RN; tolerated well.   [facility] updated and copy mailed to pt.  Pt will call nurse to schedule Covid vaccine when 90 days after covid infection which was around 09/19/24.  Shona KATHEE Diesel, RN

## 2024-11-04 ENCOUNTER — Telehealth: Payer: Self-pay

## 2024-11-04 ENCOUNTER — Other Ambulatory Visit: Payer: Self-pay

## 2024-11-04 DIAGNOSIS — R197 Diarrhea, unspecified: Secondary | ICD-10-CM

## 2024-11-04 DIAGNOSIS — R152 Fecal urgency: Secondary | ICD-10-CM

## 2024-11-04 DIAGNOSIS — K5900 Constipation, unspecified: Secondary | ICD-10-CM

## 2024-11-04 NOTE — Telephone Encounter (Signed)
 Pt called c/o of watery diarrhea x 3+ weeks... Pt states she has been going trough pads and depends due to watery stools multiple times daily and through the night... Pt states she has been taking Imodiom, Pepto, and Dicyclomine  with no relief... Pt has colonoscopy coming up in Jan 2026...  Please advise.SABRASABRA

## 2024-11-05 ENCOUNTER — Telehealth: Payer: Self-pay

## 2024-11-05 NOTE — Telephone Encounter (Signed)
 Received Eliquis  clearance from Dr.Schnier -ok to hold Eliquis  3 days prior and restart 2 days after procedure.

## 2024-11-05 NOTE — Telephone Encounter (Signed)
 Pt states she is feeling better today. I told pt to hold off on Imodium  as if she has a plug, this may keep the stool from moving... I told patient to try to continue just the Miralax  and increase fluid intake to see if this is a constipation vs just bile... Pt states she is not sure when she can get the xray

## 2024-11-05 NOTE — Addendum Note (Signed)
 Addended by: LANNIE ANDREA GRADE on: 11/05/2024 04:49 PM   Modules accepted: Orders

## 2024-11-27 ENCOUNTER — Ambulatory Visit: Admitting: Family Medicine

## 2024-12-02 MED ORDER — NA SULFATE-K SULFATE-MG SULF 17.5-3.13-1.6 GM/177ML PO SOLN: 354.0000 mL | Freq: Once | ORAL | 0 refills | Status: AC

## 2024-12-07 ENCOUNTER — Ambulatory Visit: Admitting: Certified Registered"

## 2024-12-07 ENCOUNTER — Encounter
Admission: RE | Disposition: A | Discharge status: Home / Self Care | Payer: Self-pay | Source: Home / Self Care | Attending: Gastroenterology

## 2024-12-07 ENCOUNTER — Encounter: Payer: Self-pay | Admitting: Gastroenterology

## 2024-12-07 ENCOUNTER — Other Ambulatory Visit: Payer: Self-pay

## 2024-12-07 ENCOUNTER — Ambulatory Visit
Admission: RE | Admit: 2024-12-07 | Discharge: 2024-12-07 | Disposition: A | Discharge status: Home / Self Care | Attending: Gastroenterology | Admitting: Gastroenterology

## 2024-12-07 DIAGNOSIS — I129 Hypertensive chronic kidney disease with stage 1 through stage 4 chronic kidney disease, or unspecified chronic kidney disease: Secondary | ICD-10-CM | POA: Insufficient documentation

## 2024-12-07 DIAGNOSIS — N189 Chronic kidney disease, unspecified: Secondary | ICD-10-CM | POA: Insufficient documentation

## 2024-12-07 DIAGNOSIS — Z538 Procedure and treatment not carried out for other reasons: Secondary | ICD-10-CM | POA: Insufficient documentation

## 2024-12-07 DIAGNOSIS — Z860101 Personal history of adenomatous and serrated colon polyps: Secondary | ICD-10-CM | POA: Insufficient documentation

## 2024-12-07 DIAGNOSIS — Z1211 Encounter for screening for malignant neoplasm of colon: Secondary | ICD-10-CM | POA: Diagnosis present

## 2024-12-07 DIAGNOSIS — Z860100 Personal history of colon polyps, unspecified: Secondary | ICD-10-CM

## 2024-12-07 HISTORY — PX: COLONOSCOPY: SHX5424

## 2024-12-07 MED ORDER — SODIUM CHLORIDE 0.9 % IV SOLN
INTRAVENOUS | Status: DC
  Administered 2024-12-07: 20 mL/h via INTRAVENOUS

## 2024-12-07 NOTE — Anesthesia Postprocedure Evaluation (Signed)
"   Anesthesia Post Note  Patient: Carrie Larsen  Procedure(s) Performed: COLONOSCOPY  Patient location during evaluation: Endoscopy Anesthesia Type: General Level of consciousness: awake and alert Pain management: pain level controlled Vital Signs Assessment: post-procedure vital signs reviewed and stable Respiratory status: spontaneous breathing, nonlabored ventilation, respiratory function stable and patient connected to nasal cannula oxygen Cardiovascular status: blood pressure returned to baseline and stable Postop Assessment: no apparent nausea or vomiting Anesthetic complications: no   No notable events documented.   Last Vitals:  Vitals:   12/07/24 1005  BP: (!) 143/82  Pulse: 99  Resp: 20  Temp: (!) 36.2 C  SpO2: 100%    Last Pain:  Vitals:   12/07/24 1005  TempSrc: Temporal  PainSc: 0-No pain                 Fairy A Adianna Darwin      "

## 2024-12-07 NOTE — Addendum Note (Signed)
 Addendum  created 12/07/24 1148 by Macy Lingenfelter, Fairy LABOR, MD   Clinical Note Signed
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
## 2024-12-07 NOTE — Anesthesia Preprocedure Evaluation (Signed)
"                                    Anesthesia Evaluation  Patient identified by MRN, date of birth, ID band Patient awake    Reviewed: Allergy & Precautions, H&P , NPO status , Patient's Chart, lab work & pertinent test results  Airway Mallampati: II  TM Distance: >3 FB Neck ROM: Full    Dental no notable dental hx.    Pulmonary neg pulmonary ROS   Pulmonary exam normal breath sounds clear to auscultation       Cardiovascular hypertension, negative cardio ROS Normal cardiovascular exam Rhythm:Regular Rate:Normal     Neuro/Psych negative neurological ROS  negative psych ROS   GI/Hepatic negative GI ROS, Neg liver ROS,,,  Endo/Other  negative endocrine ROS    Renal/GU negative Renal ROS  negative genitourinary   Musculoskeletal negative musculoskeletal ROS (+)    Abdominal   Peds negative pediatric ROS (+)  Hematology negative hematology ROS (+)   Anesthesia Other Findings   Reproductive/Obstetrics negative OB ROS                              Anesthesia Physical Anesthesia Plan  ASA: 2  Anesthesia Plan: General   Post-op Pain Management:    Induction: Intravenous  PONV Risk Score and Plan: 1 and Ondansetron   Airway Management Planned:   Additional Equipment:   Intra-op Plan:   Post-operative Plan: Extubation in OR  Informed Consent: I have reviewed the patients History and Physical, chart, labs and discussed the procedure including the risks, benefits and alternatives for the proposed anesthesia with the patient or authorized representative who has indicated his/her understanding and acceptance.     Dental advisory given  Plan Discussed with: CRNA  Anesthesia Plan Comments:         Anesthesia Quick Evaluation  "

## 2024-12-07 NOTE — OR Nursing (Signed)
 Brown stool noted on the chux pad. Dr. Jinny called into procedure room 4 and discussed rescheduling the colonoscopy upon visualizing brown stool on chux pad. She agreed. Patient was then transported to Endo recovery.

## 2024-12-07 NOTE — Transfer of Care (Signed)
 Immediate Anesthesia Transfer of Care Note  Patient: Carrie Larsen  Procedure(s) Performed: COLONOSCOPY  Patient Location: PACU and Endoscopy Unit  Anesthesia Type: none  Level of Consciousness: awake  Airway & Oxygen Therapy: Patient Spontanous Breathing  Post-op Assessment: Report given to RN and Post -op Vital signs reviewed and stable  Post vital signs: Reviewed and stable  Last Vitals:  Vitals Value Taken Time  BP    Temp    Pulse    Resp    SpO2      Last Pain:  Vitals:   12/07/24 1005  TempSrc: Temporal  PainSc: 0-No pain         Complications: No notable events documented.

## 2024-12-08 ENCOUNTER — Encounter: Payer: Self-pay | Admitting: Gastroenterology

## 2024-12-08 ENCOUNTER — Encounter: Admission: RE | Disposition: A | Payer: Self-pay | Source: Home / Self Care | Attending: Gastroenterology

## 2024-12-08 ENCOUNTER — Ambulatory Visit: Admitting: Anesthesiology

## 2024-12-08 ENCOUNTER — Other Ambulatory Visit: Payer: Self-pay

## 2024-12-08 ENCOUNTER — Ambulatory Visit
Admission: RE | Admit: 2024-12-08 | Discharge: 2024-12-08 | Disposition: A | Attending: Gastroenterology | Admitting: Gastroenterology

## 2024-12-08 DIAGNOSIS — D631 Anemia in chronic kidney disease: Secondary | ICD-10-CM | POA: Diagnosis not present

## 2024-12-08 DIAGNOSIS — Z86718 Personal history of other venous thrombosis and embolism: Secondary | ICD-10-CM | POA: Insufficient documentation

## 2024-12-08 DIAGNOSIS — Z1211 Encounter for screening for malignant neoplasm of colon: Secondary | ICD-10-CM | POA: Insufficient documentation

## 2024-12-08 DIAGNOSIS — K635 Polyp of colon: Secondary | ICD-10-CM | POA: Diagnosis not present

## 2024-12-08 DIAGNOSIS — I5032 Chronic diastolic (congestive) heart failure: Secondary | ICD-10-CM | POA: Diagnosis not present

## 2024-12-08 DIAGNOSIS — Z860100 Personal history of colon polyps, unspecified: Secondary | ICD-10-CM

## 2024-12-08 DIAGNOSIS — N189 Chronic kidney disease, unspecified: Secondary | ICD-10-CM | POA: Diagnosis not present

## 2024-12-08 DIAGNOSIS — I130 Hypertensive heart and chronic kidney disease with heart failure and stage 1 through stage 4 chronic kidney disease, or unspecified chronic kidney disease: Secondary | ICD-10-CM | POA: Insufficient documentation

## 2024-12-08 DIAGNOSIS — I2510 Atherosclerotic heart disease of native coronary artery without angina pectoris: Secondary | ICD-10-CM | POA: Insufficient documentation

## 2024-12-08 DIAGNOSIS — D122 Benign neoplasm of ascending colon: Secondary | ICD-10-CM | POA: Insufficient documentation

## 2024-12-08 DIAGNOSIS — Z860101 Personal history of adenomatous and serrated colon polyps: Secondary | ICD-10-CM | POA: Diagnosis not present

## 2024-12-08 DIAGNOSIS — I252 Old myocardial infarction: Secondary | ICD-10-CM | POA: Insufficient documentation

## 2024-12-08 HISTORY — PX: COLONOSCOPY: SHX5424

## 2024-12-08 HISTORY — PX: POLYPECTOMY: SHX149

## 2024-12-08 SURGERY — COLONOSCOPY
Anesthesia: General

## 2024-12-08 MED ORDER — PROPOFOL 10 MG/ML IV BOLUS
INTRAVENOUS | Status: DC | PRN
  Administered 2024-12-08: 50 mg via INTRAVENOUS

## 2024-12-08 MED ORDER — PROPOFOL 500 MG/50ML IV EMUL
INTRAVENOUS | Status: DC | PRN
  Administered 2024-12-08: 100 ug/kg/min via INTRAVENOUS

## 2024-12-08 MED ORDER — SODIUM CHLORIDE 0.9 % IV SOLN: INTRAVENOUS | Status: DC

## 2024-12-08 NOTE — Op Note (Signed)
 [facility] Gastroenterology Patient Name: Carrie Larsen Procedure Date: 12/08/2024 11:28 AM MRN: [id number] Account #: [id number] Date of Birth: [birth year] Admit Type: Outpatient Age: 79 Room: [facility] ENDO ROOM 4 Gender: Female Note Status: Finalized Instrument Name: Colon Scope [id number] Procedure:             Colonoscopy Indications:           High risk colon cancer surveillance: Personal history                         of colonic polyps Providers:             Rogelia Copping MD, MD Referring MD:          Marsa DOROTHA Officer (Referring MD) Medicines:             Propofol  per Anesthesia Complications:         No immediate complications. Procedure:             Pre-Anesthesia Assessment:                        - Prior to the procedure, a History and Physical was                         performed, and patient medications and allergies were                         reviewed. The patient's tolerance of previous                         anesthesia was also reviewed. The risks and benefits                         of the procedure and the sedation options and risks                         were discussed with the patient. All questions were                         answered, and informed consent was obtained. Prior                         Anticoagulants: The patient has taken no anticoagulant                         or antiplatelet agents. ASA Grade Assessment: II - A                         patient with mild systemic disease. After reviewing                         the risks and benefits, the patient was deemed in                         satisfactory condition to undergo the procedure.                        After obtaining informed consent, the colonoscope was  passed under direct vision. Throughout the procedure,                         the patient's blood pressure, pulse, and oxygen                         saturations were monitored continuously. The                          Colonoscope was introduced through the anus and                         advanced to the the cecum, identified by appendiceal                         orifice and ileocecal valve. The colonoscopy was                         performed without difficulty. The patient tolerated                         the procedure well. The quality of the bowel                         preparation was poor. Findings:      The perianal and digital rectal examinations were normal.      Two sessile polyps were found in the ascending colon. The polyps were 3       to 4 mm in size. These polyps were removed with a cold snare. Resection       and retrieval were complete.      A moderate amount of stool was found in the entire colon. Impression:            - Preparation of the colon was poor.                        - Two 3 to 4 mm polyps in the ascending colon, removed                         with a cold snare. Resected and retrieved.                        - Stool in the entire examined colon. Recommendation:        - Discharge patient to home.                        - Resume previous diet.                        - Continue present medications.                        - Await pathology results.                        - Repeat colonoscopy is not recommended for                         surveillance. Procedure Code(s):     ---  Professional ---                        [phone], Colonoscopy, flexible; with removal of                         tumor(s), polyp(s), or other lesion(s) by snare                         technique Diagnosis Code(s):     --- Professional ---                        Z86.010, Personal history of colonic polyps                        D12.2, Benign neoplasm of ascending colon CPT copyright 2022 American Medical Association. All rights reserved. The codes documented in this report are preliminary and upon coder review may  be revised to meet current compliance requirements. Rogelia Copping MD, MD 12/08/2024 12:02:35 PM This report has been signed electronically. Number of Addenda: 0 Note Initiated On: 12/08/2024 11:28 AM Estimated Blood Loss:  Estimated blood loss: none.      [facility]

## 2024-12-08 NOTE — Transfer of Care (Signed)
 Immediate Anesthesia Transfer of Care Note  Patient: Carrie Larsen  Procedure(s) Performed: COLONOSCOPY  Patient Location: PACU  Anesthesia Type:General  Level of Consciousness: awake, alert , and oriented  Airway & Oxygen Therapy: Patient Spontanous Breathing and Patient connected to nasal cannula oxygen  Post-op Assessment: Report given to RN, Post -op Vital signs reviewed and stable, and Patient moving all extremities  Post vital signs: Reviewed and stable  Last Vitals:  Vitals Value Taken Time  BP    Temp 35.9 C 12/08/24 12:07  Pulse    Resp    SpO2      Last Pain:  Vitals:   12/08/24 1207  TempSrc: Temporal  PainSc: 0-No pain         Complications: No notable events documented.

## 2024-12-08 NOTE — Anesthesia Postprocedure Evaluation (Signed)
"   Anesthesia Post Note  Patient: Carrie Larsen  Procedure(s) Performed: COLONOSCOPY  Patient location during evaluation: Endoscopy Anesthesia Type: General Level of consciousness: awake and alert Pain management: pain level controlled Vital Signs Assessment: post-procedure vital signs reviewed and stable Respiratory status: spontaneous breathing, nonlabored ventilation, respiratory function stable and patient connected to nasal cannula oxygen Cardiovascular status: blood pressure returned to baseline and stable Postop Assessment: no apparent nausea or vomiting Anesthetic complications: no   No notable events documented.   Last Vitals:  Vitals:   12/08/24 1207 12/08/24 1217  BP: (!) 100/49 (!) 119/57  Pulse: 81 79  Resp: 12 (!) 23  Temp: (!) 35.9 C   SpO2: 100% 100%    Last Pain:  Vitals:   12/08/24 1217  TempSrc:   PainSc: 0-No pain                 [name]      "

## 2024-12-08 NOTE — Anesthesia Preprocedure Evaluation (Signed)
"                                    Anesthesia Evaluation  Patient identified by MRN, date of birth, ID band Patient awake    Reviewed: Allergy & Precautions, H&P , NPO status , Patient's Chart, lab work & pertinent test results  History of Anesthesia Complications Negative for: history of anesthetic complications  Airway Mallampati: II  TM Distance: >3 FB Neck ROM: Full    Dental no notable dental hx.    Pulmonary neg pulmonary ROS   Pulmonary exam normal breath sounds clear to auscultation       Cardiovascular hypertension, + CAD, + Past MI, +CHF and + DVT  Normal cardiovascular exam+ Valvular Problems/Murmurs  Rhythm:Regular Rate:Normal     Neuro/Psych Seizures -,  PSYCHIATRIC DISORDERS Anxiety      Neuromuscular disease    GI/Hepatic Neg liver ROS,GERD  Medicated,,  Endo/Other  negative endocrine ROS    Renal/GU CRFRenal disease  negative genitourinary   Musculoskeletal negative musculoskeletal ROS (+)    Abdominal   Peds negative pediatric ROS (+)  Hematology  (+) Blood dyscrasia, anemia   Anesthesia Other Findings   Reproductive/Obstetrics negative OB ROS                              Anesthesia Physical Anesthesia Plan  ASA: 3  Anesthesia Plan: General   Post-op Pain Management: Minimal or no pain anticipated   Induction: Intravenous  PONV Risk Score and Plan: 2 and Propofol  infusion and TIVA  Airway Management Planned: Natural Airway and Nasal Cannula  Additional Equipment:   Intra-op Plan:   Post-operative Plan: Extubation in OR  Informed Consent: I have reviewed the patients History and Physical, chart, labs and discussed the procedure including the risks, benefits and alternatives for the proposed anesthesia with the patient or authorized representative who has indicated his/her understanding and acceptance.     Dental advisory given  Plan Discussed with: CRNA  Anesthesia Plan Comments:  (Patient consented for risks of anesthesia including but not limited to:  - adverse reactions to medications - risk of airway placement if required - damage to eyes, teeth, lips or other oral mucosa - nerve damage due to positioning  - sore throat or hoarseness - Damage to heart, brain, nerves, lungs, other parts of body or loss of life  Patient voiced understanding and assent.)         Anesthesia Quick Evaluation  "
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
## 2024-12-09 ENCOUNTER — Ambulatory Visit

## 2024-12-09 LAB — SURGICAL PATHOLOGY

## 2024-12-10 ENCOUNTER — Ambulatory Visit

## 2024-12-10 ENCOUNTER — Ambulatory Visit: Payer: Self-pay | Admitting: Gastroenterology

## 2024-12-10 DIAGNOSIS — Z719 Counseling, unspecified: Secondary | ICD-10-CM

## 2024-12-10 DIAGNOSIS — Z23 Encounter for immunization: Secondary | ICD-10-CM | POA: Diagnosis not present
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
## 2024-12-14 ENCOUNTER — Telehealth: Payer: Self-pay | Admitting: Family Medicine

## 2024-12-14 NOTE — Telephone Encounter (Unsigned)
 Copied from CRM [id number]. Topic: General - Other >> Dec 14, 2024  1:31 PM Olam RAMAN wrote: Reason for CRM: pt was calling to get her Medicare/medicaidinformation. provided ins information

## 2024-12-17 ENCOUNTER — Other Ambulatory Visit (INDEPENDENT_AMBULATORY_CARE_PROVIDER_SITE_OTHER): Payer: Self-pay

## 2024-12-17 ENCOUNTER — Telehealth (INDEPENDENT_AMBULATORY_CARE_PROVIDER_SITE_OTHER): Payer: Self-pay

## 2024-12-17 DIAGNOSIS — Z86718 Personal history of other venous thrombosis and embolism: Secondary | ICD-10-CM

## 2024-12-17 DIAGNOSIS — E782 Mixed hyperlipidemia: Secondary | ICD-10-CM

## 2024-12-17 MED ORDER — APIXABAN 2.5 MG PO TABS: 2.5000 mg | ORAL_TABLET | Freq: Two times a day (BID) | ORAL | 3 refills | Status: AC
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
## 2024-12-24 ENCOUNTER — Other Ambulatory Visit (HOSPITAL_COMMUNITY): Payer: Self-pay

## 2024-12-31 ENCOUNTER — Telehealth: Payer: Self-pay

## 2024-12-31 NOTE — Telephone Encounter (Signed)
 Spoken to patient and scheduled patient on 02/02/2025 which is the soonest appointment at this time.

## 2024-12-31 NOTE — Telephone Encounter (Signed)
 Pt is still having severe diarrhea . She is getting up multiple times during the night and its just coming out. Pt is taking up to 4 doses of imodium  daily and it still isn't stopping it. Pt needs something called into her pharmacy. Pt requesting a call back to let her know when you send something for her.

## 2025-01-20 ENCOUNTER — Encounter: Admitting: Family Medicine

## 2025-01-27 ENCOUNTER — Ambulatory Visit

## 2025-02-02 ENCOUNTER — Ambulatory Visit
